# Patient Record
Sex: Female | Born: 1980 | Race: White | Hispanic: No | Marital: Single | Smoking: Former smoker
Health system: Southern US, Community
[De-identification: ages and names within clinical notes are randomized; demographics above are authoritative.]

## PROBLEM LIST (undated history)

## (undated) DIAGNOSIS — J45909 Unspecified asthma, uncomplicated: Secondary | ICD-10-CM

## (undated) DIAGNOSIS — F419 Anxiety disorder, unspecified: Secondary | ICD-10-CM

## (undated) DIAGNOSIS — E559 Vitamin D deficiency, unspecified: Secondary | ICD-10-CM

## (undated) DIAGNOSIS — M797 Fibromyalgia: Secondary | ICD-10-CM

## (undated) DIAGNOSIS — M546 Pain in thoracic spine: Secondary | ICD-10-CM

## (undated) DIAGNOSIS — F909 Attention-deficit hyperactivity disorder, unspecified type: Secondary | ICD-10-CM

## (undated) DIAGNOSIS — F1910 Other psychoactive substance abuse, uncomplicated: Secondary | ICD-10-CM

## (undated) DIAGNOSIS — T7840XA Allergy, unspecified, initial encounter: Secondary | ICD-10-CM

## (undated) DIAGNOSIS — M359 Systemic involvement of connective tissue, unspecified: Secondary | ICD-10-CM

## (undated) DIAGNOSIS — E282 Polycystic ovarian syndrome: Secondary | ICD-10-CM

## (undated) DIAGNOSIS — G8929 Other chronic pain: Secondary | ICD-10-CM

## (undated) DIAGNOSIS — M8430XA Stress fracture, unspecified site, initial encounter for fracture: Secondary | ICD-10-CM

## (undated) DIAGNOSIS — D649 Anemia, unspecified: Secondary | ICD-10-CM

## (undated) DIAGNOSIS — E039 Hypothyroidism, unspecified: Secondary | ICD-10-CM

## (undated) DIAGNOSIS — E079 Disorder of thyroid, unspecified: Secondary | ICD-10-CM

## (undated) DIAGNOSIS — M722 Plantar fascial fibromatosis: Secondary | ICD-10-CM

## (undated) DIAGNOSIS — R519 Headache, unspecified: Secondary | ICD-10-CM

## (undated) DIAGNOSIS — N809 Endometriosis, unspecified: Secondary | ICD-10-CM

## (undated) DIAGNOSIS — M25551 Pain in right hip: Principal | ICD-10-CM

## (undated) DIAGNOSIS — R51 Headache: Secondary | ICD-10-CM

## (undated) HISTORY — DX: Unspecified asthma, uncomplicated: J45.909

## (undated) HISTORY — DX: Allergy, unspecified, initial encounter: T78.40XA

## (undated) HISTORY — DX: Disorder of thyroid, unspecified: E07.9

## (undated) HISTORY — DX: Pain in thoracic spine: M54.6

## (undated) HISTORY — DX: Stress fracture, unspecified site, initial encounter for fracture: M84.30XA

## (undated) HISTORY — DX: Other psychoactive substance abuse, uncomplicated: F19.10

## (undated) HISTORY — DX: Vitamin D deficiency, unspecified: E55.9

## (undated) HISTORY — DX: Other chronic pain: G89.29

## (undated) HISTORY — DX: Plantar fascial fibromatosis: M72.2

## (undated) HISTORY — PX: APPENDECTOMY: SHX54

## (undated) HISTORY — DX: Anxiety disorder, unspecified: F41.9

## (undated) HISTORY — DX: Endometriosis, unspecified: N80.9

## (undated) HISTORY — DX: Pain in right hip: M25.551

## (undated) HISTORY — DX: Attention-deficit hyperactivity disorder, unspecified type: F90.9

## (undated) HISTORY — DX: Polycystic ovarian syndrome: E28.2

## (undated) HISTORY — PX: SHOULDER SURGERY: SHX246

## (undated) HISTORY — DX: Fibromyalgia: M79.7

---

## 1999-11-04 HISTORY — PX: SHOULDER SURGERY: SHX246

## 2002-08-09 ENCOUNTER — Emergency Department (HOSPITAL_COMMUNITY): Admission: EM | Admit: 2002-08-09 | Discharge: 2002-08-09 | Payer: Self-pay | Admitting: Nurse Practitioner

## 2002-11-29 ENCOUNTER — Emergency Department (HOSPITAL_COMMUNITY): Admission: EM | Admit: 2002-11-29 | Discharge: 2002-11-29 | Payer: Self-pay | Admitting: Emergency Medicine

## 2006-11-03 HISTORY — PX: APPENDECTOMY: SHX54

## 2007-11-13 ENCOUNTER — Emergency Department (HOSPITAL_COMMUNITY): Admission: EM | Admit: 2007-11-13 | Discharge: 2007-11-13 | Payer: Self-pay | Admitting: Emergency Medicine

## 2011-06-22 ENCOUNTER — Emergency Department: Payer: Self-pay | Admitting: Emergency Medicine

## 2011-07-14 ENCOUNTER — Emergency Department: Payer: Self-pay | Admitting: Emergency Medicine

## 2011-07-15 ENCOUNTER — Observation Stay: Payer: Self-pay | Admitting: Internal Medicine

## 2011-10-02 ENCOUNTER — Emergency Department: Payer: Self-pay | Admitting: Internal Medicine

## 2011-10-14 ENCOUNTER — Emergency Department: Payer: Self-pay | Admitting: Emergency Medicine

## 2012-03-10 ENCOUNTER — Emergency Department: Payer: Self-pay | Admitting: Emergency Medicine

## 2012-05-18 ENCOUNTER — Emergency Department: Payer: Self-pay | Admitting: Internal Medicine

## 2012-07-07 ENCOUNTER — Emergency Department: Payer: Self-pay | Admitting: Emergency Medicine

## 2012-07-22 ENCOUNTER — Encounter: Payer: Self-pay | Admitting: Family Medicine

## 2012-08-09 ENCOUNTER — Encounter: Payer: Self-pay | Admitting: Family Medicine

## 2012-09-02 ENCOUNTER — Observation Stay: Payer: Self-pay

## 2012-09-02 LAB — COMPREHENSIVE METABOLIC PANEL
Albumin: 3 g/dL — ABNORMAL LOW (ref 3.4–5.0)
Alkaline Phosphatase: 68 U/L (ref 50–136)
Anion Gap: 10 (ref 7–16)
BUN: 4 mg/dL — ABNORMAL LOW (ref 7–18)
Bilirubin,Total: 0.3 mg/dL (ref 0.2–1.0)
Calcium, Total: 8.4 mg/dL — ABNORMAL LOW (ref 8.5–10.1)
Chloride: 107 mmol/L (ref 98–107)
Co2: 23 mmol/L (ref 21–32)
Creatinine: 0.37 mg/dL — ABNORMAL LOW (ref 0.60–1.30)
EGFR (African American): >60
EGFR (Non-African Amer.): >60
Glucose: 81 mg/dL (ref 65–99)
Osmolality: 275 (ref 275–301)
Potassium: 3.7 mmol/L (ref 3.5–5.1)
SGOT(AST): 23 U/L (ref 15–37)
SGPT (ALT): 20 U/L (ref 12–78)
Sodium: 140 mmol/L (ref 136–145)
Total Protein: 6.7 g/dL (ref 6.4–8.2)

## 2012-09-02 LAB — CBC
HCT: 33.5 % — ABNORMAL LOW (ref 35.0–47.0)
HGB: 12 g/dL (ref 12.0–16.0)
MCH: 33.6 pg (ref 26.0–34.0)
MCHC: 35.8 g/dL (ref 32.0–36.0)
MCV: 94 fL (ref 80–100)
Platelet: 181 10*3/uL (ref 150–440)
RBC: 3.57 10*6/uL — ABNORMAL LOW (ref 3.80–5.20)
RDW: 12.9 % (ref 11.5–14.5)
WBC: 10 10*3/uL (ref 3.6–11.0)

## 2012-09-02 LAB — URINALYSIS, COMPLETE
Bilirubin,UR: NEGATIVE
Blood: NEGATIVE
Glucose,UR: NEGATIVE mg/dL (ref 0–75)
Ketone: NEGATIVE
Nitrite: NEGATIVE
Ph: 8 (ref 4.5–8.0)
Protein: NEGATIVE
RBC,UR: 3 /HPF (ref 0–5)
Specific Gravity: 1.012 (ref 1.003–1.030)
Squamous Epithelial: 34
WBC UR: 3 /HPF (ref 0–5)

## 2012-09-02 LAB — LIPASE, BLOOD: Lipase: 132 U/L (ref 73–393)

## 2012-09-03 ENCOUNTER — Encounter: Payer: Self-pay | Admitting: Family Medicine

## 2012-10-06 ENCOUNTER — Emergency Department: Payer: Self-pay | Admitting: Emergency Medicine

## 2012-11-14 ENCOUNTER — Emergency Department: Payer: Self-pay | Admitting: Emergency Medicine

## 2012-11-16 LAB — BETA STREP CULTURE(ARMC)

## 2012-11-29 ENCOUNTER — Observation Stay: Payer: Self-pay | Admitting: Obstetrics and Gynecology

## 2012-12-14 DIAGNOSIS — Z3480 Encounter for supervision of other normal pregnancy, unspecified trimester: Secondary | ICD-10-CM | POA: Insufficient documentation

## 2012-12-16 DIAGNOSIS — J45909 Unspecified asthma, uncomplicated: Secondary | ICD-10-CM | POA: Insufficient documentation

## 2012-12-16 DIAGNOSIS — F3132 Bipolar disorder, current episode depressed, moderate: Secondary | ICD-10-CM | POA: Insufficient documentation

## 2013-03-02 ENCOUNTER — Emergency Department: Payer: Self-pay | Admitting: Internal Medicine

## 2013-03-02 LAB — URINALYSIS, COMPLETE
Bacteria: NONE SEEN
Bilirubin,UR: NEGATIVE
Bilirubin,UR: NEGATIVE
Glucose,UR: NEGATIVE mg/dL (ref 0–75)
Glucose,UR: NEGATIVE mg/dL (ref 0–75)
Ketone: NEGATIVE
Ketone: NEGATIVE
Leukocyte Esterase: NEGATIVE
Nitrite: NEGATIVE
Nitrite: NEGATIVE
Ph: 6 (ref 4.5–8.0)
Ph: 6 (ref 4.5–8.0)
Protein: 30
Protein: NEGATIVE
RBC,UR: 116 /HPF (ref 0–5)
RBC,UR: 1 /HPF (ref 0–5)
Specific Gravity: 1.013 (ref 1.003–1.030)
Specific Gravity: 1.011 (ref 1.003–1.030)
Squamous Epithelial: 14
Squamous Epithelial: 1
WBC UR: 10 /HPF (ref 0–5)
WBC UR: 1 /HPF (ref 0–5)

## 2013-05-02 ENCOUNTER — Emergency Department: Payer: Self-pay | Admitting: Emergency Medicine

## 2013-05-12 ENCOUNTER — Emergency Department: Payer: Self-pay

## 2013-05-12 LAB — COMPREHENSIVE METABOLIC PANEL
Albumin: 4 g/dL (ref 3.4–5.0)
Alkaline Phosphatase: 114 U/L (ref 50–136)
Anion Gap: 3 — ABNORMAL LOW (ref 7–16)
BUN: 15 mg/dL (ref 7–18)
Bilirubin,Total: 0.4 mg/dL (ref 0.2–1.0)
Calcium, Total: 8.9 mg/dL (ref 8.5–10.1)
Chloride: 109 mmol/L — ABNORMAL HIGH (ref 98–107)
Co2: 29 mmol/L (ref 21–32)
Creatinine: 0.99 mg/dL (ref 0.60–1.30)
EGFR (African American): >60
EGFR (Non-African Amer.): >60
Glucose: 95 mg/dL (ref 65–99)
Osmolality: 282 (ref 275–301)
Potassium: 3.9 mmol/L (ref 3.5–5.1)
SGOT(AST): 19 U/L (ref 15–37)
SGPT (ALT): 22 U/L (ref 12–78)
Sodium: 141 mmol/L (ref 136–145)
Total Protein: 7.8 g/dL (ref 6.4–8.2)

## 2013-05-12 LAB — LIPASE, BLOOD: Lipase: 129 U/L (ref 73–393)

## 2013-05-12 LAB — CBC
HCT: 40.5 % (ref 35.0–47.0)
HGB: 13.6 g/dL (ref 12.0–16.0)
MCH: 30.6 pg (ref 26.0–34.0)
MCHC: 33.7 g/dL (ref 32.0–36.0)
MCV: 91 fL (ref 80–100)
Platelet: 222 10*3/uL (ref 150–440)
RBC: 4.47 10*6/uL (ref 3.80–5.20)
RDW: 14.7 % — ABNORMAL HIGH (ref 11.5–14.5)
WBC: 10.9 10*3/uL (ref 3.6–11.0)

## 2013-05-12 LAB — URINALYSIS, COMPLETE
Bilirubin,UR: NEGATIVE
Glucose,UR: NEGATIVE mg/dL (ref 0–75)
Ketone: NEGATIVE
Leukocyte Esterase: NEGATIVE
Nitrite: NEGATIVE
Ph: 6 (ref 4.5–8.0)
Protein: 100
RBC,UR: 3 /HPF (ref 0–5)
Specific Gravity: 1.008 (ref 1.003–1.030)
Squamous Epithelial: 8
WBC UR: 3 /HPF (ref 0–5)

## 2013-06-02 ENCOUNTER — Emergency Department: Payer: Self-pay | Admitting: Internal Medicine

## 2013-06-11 ENCOUNTER — Emergency Department: Payer: Self-pay | Admitting: Emergency Medicine

## 2013-06-17 DIAGNOSIS — M8430XA Stress fracture, unspecified site, initial encounter for fracture: Secondary | ICD-10-CM

## 2013-06-17 HISTORY — DX: Stress fracture, unspecified site, initial encounter for fracture: M84.30XA

## 2013-06-27 ENCOUNTER — Ambulatory Visit: Payer: Self-pay | Admitting: Family Medicine

## 2013-07-01 DIAGNOSIS — M722 Plantar fascial fibromatosis: Secondary | ICD-10-CM

## 2013-07-01 HISTORY — DX: Plantar fascial fibromatosis: M72.2

## 2013-08-02 ENCOUNTER — Encounter: Payer: Self-pay | Admitting: *Deleted

## 2013-08-02 DIAGNOSIS — M8430XA Stress fracture, unspecified site, initial encounter for fracture: Secondary | ICD-10-CM | POA: Insufficient documentation

## 2013-08-02 DIAGNOSIS — M722 Plantar fascial fibromatosis: Secondary | ICD-10-CM | POA: Insufficient documentation

## 2013-08-05 ENCOUNTER — Encounter: Payer: Self-pay | Admitting: Podiatry

## 2013-08-05 ENCOUNTER — Ambulatory Visit: Payer: Self-pay | Admitting: Podiatry

## 2013-08-05 ENCOUNTER — Ambulatory Visit (INDEPENDENT_AMBULATORY_CARE_PROVIDER_SITE_OTHER): Payer: Medicaid Other | Admitting: Podiatry

## 2013-08-05 DIAGNOSIS — M722 Plantar fascial fibromatosis: Secondary | ICD-10-CM

## 2013-08-05 NOTE — Patient Instructions (Signed)
Call if any problems

## 2013-08-08 NOTE — Progress Notes (Signed)
Subjective:     Patient ID: Renee Benjamin, female   DOB: [birth year], 32 y.o.   MRN: [id number]  HPIIm improving but still having pain in my left foot. States her right foot continues to improve   Review of Systems  All other systems reviewed and are negative.       Objective:   Physical Exam  Cardiovascular: Intact distal pulses.    Neurological in tact, muscle strength adequate. I noticed pain is diminishing in the left foot currently    Assessment:     Gradual improvement in feet    Plan:     Continue p.t.  Increase activity slowly and soaks at night. Supportive shoe gear at all times

## 2014-02-01 ENCOUNTER — Emergency Department: Payer: Self-pay | Admitting: Emergency Medicine

## 2014-02-03 LAB — BETA STREP CULTURE(ARMC)

## 2014-03-14 ENCOUNTER — Encounter: Payer: Self-pay | Admitting: Podiatry

## 2014-03-14 ENCOUNTER — Ambulatory Visit (INDEPENDENT_AMBULATORY_CARE_PROVIDER_SITE_OTHER): Payer: Medicaid Other

## 2014-03-14 ENCOUNTER — Ambulatory Visit (INDEPENDENT_AMBULATORY_CARE_PROVIDER_SITE_OTHER): Payer: PRIVATE HEALTH INSURANCE | Admitting: Podiatry

## 2014-03-14 VITALS — BP 118/68 | HR 62 | Resp 16 | Ht 68.0 in | Wt 202.0 lb

## 2014-03-14 DIAGNOSIS — M722 Plantar fascial fibromatosis: Secondary | ICD-10-CM

## 2014-03-14 DIAGNOSIS — R609 Edema, unspecified: Secondary | ICD-10-CM

## 2014-03-14 DIAGNOSIS — M779 Enthesopathy, unspecified: Secondary | ICD-10-CM

## 2014-03-14 DIAGNOSIS — M948X9 Other specified disorders of cartilage, unspecified sites: Secondary | ICD-10-CM

## 2014-03-14 MED ORDER — TRIAMCINOLONE ACETONIDE 10 MG/ML IJ SUSP
10.0000 mg | Freq: Once | INTRAMUSCULAR | Status: AC
  Administered 2014-03-14: 10 mg

## 2014-03-15 NOTE — Progress Notes (Signed)
Subjective:     Patient ID: Renee Benjamin, female   DOB: [birth year], 33 y.o.   MRN: [id number]  HPI patient states that my left foot has been swollen and bothering me for the last few weeks and I don't remember any injury   Review of Systems     Objective:   Physical Exam Neurovascular status intact with muscle strength adequate and no range of motion loss. I noted there to be discomfort in the sesamoidal complex left with inflammation and fluid buildup with the entire plantar surface of the sesamoidal complex been painful    Assessment:     Probable sesamoiditis or plantar capsule inflammation of the first MPJ    Plan:     H&P and x-ray reviewed. Explained condition and that we may need to immobilize this and she does have a boot at home which she will wear since it is inflamed I did a careful plantar capsular injection 3 mg Kenalog dexamethasone 5 mg Xylocaine and advised him reduced activity. If it does not improve reappoint

## 2014-03-16 ENCOUNTER — Emergency Department: Payer: Self-pay | Admitting: Emergency Medicine

## 2014-03-16 ENCOUNTER — Encounter: Payer: Self-pay | Admitting: *Deleted

## 2014-03-16 LAB — COMPREHENSIVE METABOLIC PANEL
Albumin: 3.3 g/dL — ABNORMAL LOW (ref 3.4–5.0)
Alkaline Phosphatase: 68 U/L
Anion Gap: 5 — ABNORMAL LOW (ref 7–16)
BUN: 6 mg/dL — ABNORMAL LOW (ref 7–18)
Bilirubin,Total: 0.3 mg/dL (ref 0.2–1.0)
Calcium, Total: 8.9 mg/dL (ref 8.5–10.1)
Chloride: 106 mmol/L (ref 98–107)
Co2: 29 mmol/L (ref 21–32)
Creatinine: 0.63 mg/dL (ref 0.60–1.30)
EGFR (African American): >60
EGFR (Non-African Amer.): >60
Glucose: 88 mg/dL (ref 65–99)
Osmolality: 276 (ref 275–301)
Potassium: 3.8 mmol/L (ref 3.5–5.1)
SGOT(AST): 11 U/L — ABNORMAL LOW (ref 15–37)
SGPT (ALT): 13 U/L (ref 12–78)
Sodium: 140 mmol/L (ref 136–145)
Total Protein: 7.5 g/dL (ref 6.4–8.2)

## 2014-03-16 LAB — CBC WITH DIFFERENTIAL/PLATELET
Basophil #: 0 10*3/uL (ref 0.0–0.1)
Basophil %: 0.6 %
Eosinophil #: 0 10*3/uL (ref 0.0–0.7)
Eosinophil %: 0.5 %
HCT: 37.9 % (ref 35.0–47.0)
HGB: 13 g/dL (ref 12.0–16.0)
Lymphocyte #: 2.4 10*3/uL (ref 1.0–3.6)
Lymphocyte %: 30.8 %
MCH: 31.9 pg (ref 26.0–34.0)
MCHC: 34.4 g/dL (ref 32.0–36.0)
MCV: 93 fL (ref 80–100)
Monocyte #: 0.5 x10 3/mm (ref 0.2–0.9)
Monocyte %: 6.4 %
Neutrophil #: 4.8 10*3/uL (ref 1.4–6.5)
Neutrophil %: 61.7 %
Platelet: 255 10*3/uL (ref 150–440)
RBC: 4.08 10*6/uL (ref 3.80–5.20)
RDW: 12.5 % (ref 11.5–14.5)
WBC: 7.8 10*3/uL (ref 3.6–11.0)

## 2014-03-16 LAB — URINALYSIS, COMPLETE
Bilirubin,UR: NEGATIVE
Blood: NEGATIVE
Glucose,UR: NEGATIVE mg/dL (ref 0–75)
Ketone: NEGATIVE
Leukocyte Esterase: NEGATIVE
Nitrite: NEGATIVE
Ph: 5 (ref 4.5–8.0)
Protein: NEGATIVE
RBC,UR: 4 /HPF (ref 0–5)
Specific Gravity: 1.027 (ref 1.003–1.030)
Squamous Epithelial: 24
WBC UR: 13 /HPF (ref 0–5)

## 2014-03-28 ENCOUNTER — Ambulatory Visit (INDEPENDENT_AMBULATORY_CARE_PROVIDER_SITE_OTHER): Payer: PRIVATE HEALTH INSURANCE | Admitting: General Surgery

## 2014-03-28 ENCOUNTER — Encounter: Payer: Self-pay | Admitting: General Surgery

## 2014-03-28 VITALS — BP 122/80 | HR 82 | Resp 12 | Ht 68.0 in | Wt 200.0 lb

## 2014-03-28 DIAGNOSIS — R1900 Intra-abdominal and pelvic swelling, mass and lump, unspecified site: Secondary | ICD-10-CM | POA: Diagnosis not present

## 2014-03-28 DIAGNOSIS — R222 Localized swelling, mass and lump, trunk: Secondary | ICD-10-CM

## 2014-03-28 NOTE — Patient Instructions (Signed)
May use heating pad to the area for comfort. Follow up in 2 weeks

## 2014-03-28 NOTE — Progress Notes (Signed)
Patient ID: Renee Benjamin, female   DOB: 1981-06-18, 33 y.o.   MRN: [id number]  Chief Complaint  Patient presents with  . Hernia    HPI Renee Benjamin is a 33 y.o. female.  Here today for evaulation of a possible hernia. States she has had left lower abdominal pain since Mar 12 2014 lifting her son off the carousel. She states there is a knot there and initially had noticed some bruising that does seem to be better. Went to the ER on 03-16-14 and they did rays and a CT scan.  HPI  Past Medical History  Diagnosis Date  . Allergy   . Asthma   . Gestational diabetes   . Endometriosis   . Polycystic ovarian disease     Past Surgical History  Procedure Laterality Date  . Appendectomy  2008  . Shoulder surgery  2001  . Cesarean section  2014    History reviewed. No pertinent family history.  Social History History  Substance Use Topics  . Smoking status: Former Research scientist (life sciences)  . Smokeless tobacco: Never Used  . Alcohol Use: No    Allergies  Allergen Reactions  . Depakote [Divalproex Sodium] Hives  . Morphine And Related Hives  . Tramadol Nausea And Vomiting    Current Outpatient Prescriptions  Medication Sig Dispense Refill  . azelastine (ASTELIN) 0.1 % nasal spray Place into both nostrils 2 (two) times daily. Use in each nostril as directed      . fexofenadine (ALLEGRA) 30 MG tablet Take 180 mg by mouth daily.       Renee Benjamin HYDROcodone-acetaminophen (NORCO/VICODIN) 5-325 MG per tablet Take 0.5 tablets by mouth every 6 (six) hours as needed for moderate pain.       . mometasone (NASONEX) 50 MCG/ACT nasal spray Place 2 sprays into the nose daily.      . montelukast (SINGULAIR) 10 MG tablet Take 10 mg by mouth at bedtime.       No current facility-administered medications for this visit.    Review of Systems Review of Systems  Constitutional: Negative.   Respiratory: Negative.   Gastrointestinal: Positive for abdominal pain and diarrhea. Negative for blood in stool.     Blood pressure 122/80, pulse 82, resp. rate 12, height 5\' 8"  (1.727 m), weight 200 lb (90.719 kg), last menstrual period 03/26/2014.  Physical Exam Physical Exam  Constitutional: She is oriented to person, place, and time. She appears well-developed and well-nourished.  Eyes: Conjunctivae are normal.  Neck: Neck supple.  Cardiovascular: Normal rate, regular rhythm and normal heart sounds.   Pulmonary/Chest: Effort normal and breath sounds normal.  Abdominal: Soft. Normal appearance and bowel sounds are normal. There is tenderness in the left lower quadrant.  2 x 1 cm hard subcutaneous mass left lower quadrant. The over lying skin has a mild resolving ecchymosis.   Lymphadenopathy:    She has no cervical adenopathy.  Neurological: She is alert and oriented to person, place, and time.  Skin: Skin is warm and dry.    Data Reviewed CT scan reviewed.  Assessment    A 2 x 1 cm hard subcutaneous mass left lower quadrant. The over lying skin has a mild resolving ecchymosis. Likely a hematoma now resolving. Remote possibility of underlying endometrioma.      Plan    May use heating pad to the area for comfort. Follow up in 3 weeks.       Kamarian Sahakian G Azalea Cedar 03/28/2014, 4:52 PM

## 2014-04-19 ENCOUNTER — Ambulatory Visit: Payer: Medicaid Other | Admitting: General Surgery

## 2014-04-25 ENCOUNTER — Ambulatory Visit: Payer: Medicaid Other | Admitting: General Surgery

## 2014-05-11 ENCOUNTER — Encounter: Payer: Self-pay | Admitting: *Deleted

## 2014-06-07 ENCOUNTER — Emergency Department: Payer: Self-pay | Admitting: Emergency Medicine

## 2014-06-07 LAB — URINALYSIS, COMPLETE
Bilirubin,UR: NEGATIVE
Blood: NEGATIVE
Glucose,UR: NEGATIVE mg/dL (ref 0–75)
Ketone: NEGATIVE
Leukocyte Esterase: NEGATIVE
Nitrite: NEGATIVE
Ph: 5 (ref 4.5–8.0)
Protein: NEGATIVE
RBC,UR: 1 /HPF (ref 0–5)
Specific Gravity: 1.016 (ref 1.003–1.030)
Squamous Epithelial: 4
WBC UR: 1 /HPF (ref 0–5)

## 2014-06-07 LAB — PREGNANCY, URINE: Pregnancy Test, Urine: NEGATIVE m[IU]/mL

## 2014-06-24 ENCOUNTER — Emergency Department: Payer: Self-pay | Admitting: Emergency Medicine

## 2014-07-06 ENCOUNTER — Emergency Department: Payer: Self-pay | Admitting: Emergency Medicine

## 2014-08-01 ENCOUNTER — Ambulatory Visit (INDEPENDENT_AMBULATORY_CARE_PROVIDER_SITE_OTHER): Payer: PRIVATE HEALTH INSURANCE | Admitting: Podiatry

## 2014-08-01 ENCOUNTER — Ambulatory Visit (INDEPENDENT_AMBULATORY_CARE_PROVIDER_SITE_OTHER): Payer: PRIVATE HEALTH INSURANCE

## 2014-08-01 ENCOUNTER — Encounter: Payer: Self-pay | Admitting: Podiatry

## 2014-08-01 VITALS — BP 101/65 | HR 73 | Resp 16

## 2014-08-01 DIAGNOSIS — R609 Edema, unspecified: Secondary | ICD-10-CM

## 2014-08-01 DIAGNOSIS — M722 Plantar fascial fibromatosis: Secondary | ICD-10-CM

## 2014-08-01 DIAGNOSIS — M779 Enthesopathy, unspecified: Secondary | ICD-10-CM

## 2014-08-01 MED ORDER — PREDNISONE 10 MG PO TABS: ORAL_TABLET | ORAL | Status: DC

## 2014-08-01 NOTE — Progress Notes (Signed)
Subjective:     Patient ID: Renee Benjamin, female   DOB: [birth year], 33 y.o.   MRN: [id number]  Foot Pain   patient presents stating I'm having a lot of pain on top of my left foot and on the bottom and I'm not sure what it might have done. States it's bad at night and I cannot keep the foot stretched and it seems like it has gotten only a little bit better   Review of Systems  All other systems reviewed and are negative.      Objective:   Physical Exam  Constitutional: She is oriented to person, place, and time.  Cardiovascular: Intact distal pulses.   Musculoskeletal: Normal range of motion.  Neurological: She is oriented to person, place, and time.  Skin: Skin is warm.   neurovascular status found to be intact with muscle strength adequate and range of motion subtalar and midtarsal joint within normal limits. Patient is noted to have quite a bit of edema in the dorsum of the left foot but it is not specifically in the metatarsal shaft a more proximal and over a fairly wide distribution area. Mild discomfort in the lesser MPJs and in the arch itself     Assessment:     Probable tendinitis or inflammatory condition and cannot rule out stress fracture even though the pattern is not consistent with this    Plan:     H&P and x-rays reviewed. Placed on 12 a sterile prep DS Dosepak and instructed on ice therapy and dispensed night splint to try to stretch the foot while sleeping and then will wear the boot part-time during the day. Reappoint for [place] to recheck in 2 weeks

## 2014-08-11 ENCOUNTER — Ambulatory Visit (INDEPENDENT_AMBULATORY_CARE_PROVIDER_SITE_OTHER): Payer: PRIVATE HEALTH INSURANCE

## 2014-08-11 ENCOUNTER — Encounter: Payer: Self-pay | Admitting: Podiatry

## 2014-08-11 ENCOUNTER — Ambulatory Visit (INDEPENDENT_AMBULATORY_CARE_PROVIDER_SITE_OTHER): Payer: PRIVATE HEALTH INSURANCE | Admitting: Podiatry

## 2014-08-11 VITALS — BP 108/75 | HR 56 | Resp 16

## 2014-08-11 DIAGNOSIS — M779 Enthesopathy, unspecified: Secondary | ICD-10-CM

## 2014-08-14 NOTE — Progress Notes (Signed)
Subjective:     Patient ID: Renee Benjamin, female   DOB: 05/08/1981, 33 y.o.   MRN: [id number]  HPI patient presents stating I'm doing better but I still gets some pain in my foot if I been on all day   Review of Systems     Objective:   Physical Exam Neurovascular status intact with no change in health history and discomfort still noted plantar foot upon palpation    Assessment:     Continuing to develop pain but improved from previous    Plan:      at this time recommended anti-inflammatories supportive shoe gear and physica reappoint as neededl therapy.

## 2014-09-04 ENCOUNTER — Encounter: Payer: Self-pay | Admitting: Podiatry

## 2014-11-23 ENCOUNTER — Encounter: Payer: Self-pay | Admitting: Obstetrics & Gynecology

## 2014-11-23 ENCOUNTER — Encounter: Payer: Self-pay | Admitting: *Deleted

## 2014-11-23 ENCOUNTER — Ambulatory Visit (INDEPENDENT_AMBULATORY_CARE_PROVIDER_SITE_OTHER): Payer: BLUE CROSS/BLUE SHIELD | Admitting: Obstetrics & Gynecology

## 2014-11-23 VITALS — BP 109/80 | HR 78 | Ht 68.0 in | Wt 204.0 lb

## 2014-11-23 DIAGNOSIS — Z124 Encounter for screening for malignant neoplasm of cervix: Secondary | ICD-10-CM | POA: Diagnosis not present

## 2014-11-23 DIAGNOSIS — G8929 Other chronic pain: Secondary | ICD-10-CM | POA: Diagnosis not present

## 2014-11-23 DIAGNOSIS — N949 Unspecified condition associated with female genital organs and menstrual cycle: Secondary | ICD-10-CM

## 2014-11-23 DIAGNOSIS — Z01411 Encounter for gynecological examination (general) (routine) with abnormal findings: Secondary | ICD-10-CM

## 2014-11-23 DIAGNOSIS — R102 Pelvic and perineal pain: Secondary | ICD-10-CM

## 2014-11-23 DIAGNOSIS — Z01419 Encounter for gynecological examination (general) (routine) without abnormal findings: Secondary | ICD-10-CM

## 2014-11-23 DIAGNOSIS — N920 Excessive and frequent menstruation with regular cycle: Secondary | ICD-10-CM

## 2014-11-23 DIAGNOSIS — Z01818 Encounter for other preprocedural examination: Secondary | ICD-10-CM

## 2014-11-23 DIAGNOSIS — Z1151 Encounter for screening for human papillomavirus (HPV): Secondary | ICD-10-CM

## 2014-11-23 NOTE — Progress Notes (Signed)
GYNECOLOGY CLINIC ANNUAL PREVENTATIVE CARE ENCOUNTER NOTE  Subjective:     Renee Benjamin is a 34 y.o. G1P1 female here for a routine annual gynecologic exam and to establish care.  Current complaints: patient reports having a diagnosis of PCOS and endometriosis diagnosed on ultrasound (no laparoscopy).  Uses patch for contraception and AUB, but still experiences heavy bleeding requiring pad changes every 1-2 hours during her hormone withdrawal bleeding period.  She has gained a lot of weight in the last two years on the patch, and does not want any further hormonal therapy for her AUB; declines Mirena or other formulations.  Also reports worsening dyspareunia and chronic pelvic pain, L >>R.  Pain is sometimes associated with nausea and diarrhea. Patient reports having a very tough pregnancy, had GDM and ended up with cesarean section after "nine hours of pushing". Never wants to be pregnant again.  Does not want tubal sterilization, wants definitive therapy for all her symptoms in the form of hysterectomy.   Gynecologic History Patient's last menstrual period was 10/30/2014. Contraception: Ortho-Evra patches weekly Last Pap: 2015. Results were: normal  Obstetric History OB History  Gravida Para Term Preterm AB SAB TAB Ectopic Multiple Living  1 1        1     # Outcome Date GA Lbr Len/2nd Weight Sex Delivery Anes PTL Lv  1 Para             Obstetric Comments  1st Menstrual Cycle:  12   1st Pregnancy:  31  Cesarean section x 1  History   Social History  . Marital Status: Single    Spouse Name: N/A    Number of Children: N/A  . Years of Education: N/A   Occupational History  . Not on file.   Social History Main Topics  . Smoking status: Former Research scientist (life sciences)  . Smokeless tobacco: Never Used  . Alcohol Use: No  . Drug Use: No  . Sexual Activity:    Partners: Male    Birth Control/ Protection: Patch   Other Topics Concern  . Not on file   Social History Narrative   The  following portions of the patient's history were reviewed and updated as appropriate: allergies, current medications, past family history, past medical history, past social history, past surgical history and problem list.  Review of Systems Pertinent items are noted in HPI.   Objective:   BP 109/80 mmHg  Pulse 78  Ht 5\' 8"  (1.727 m)  Wt 204 lb (92.534 kg)  BMI 31.03 kg/m2  LMP 10/30/2014 GENERAL: Well-developed, well-nourished female in no acute distress.  HEENT: Normocephalic, atraumatic. Sclerae anicteric.  NECK: Supple. Normal thyroid.  LUNGS: Clear to auscultation bilaterally.  HEART: Regular rate and rhythm. BREASTS: Symmetric in size. No masses, skin changes, nipple drainage, or lymphadenopathy. ABDOMEN: Soft, mild tenderness in LLQ, nondistended. No organomegaly. Healed Pfannebsteil incision. PELVIC: Normal external female genitalia. Vagina is pink and rugated.  Normal discharge. Normal cervix contour. Pap smear obtained. Uterus is normal in size. No adnexal mass or tenderness.  EXTREMITIES: No cyanosis, clubbing, or edema, 2+ distal pulses.   Assessment:   Annual gynecologic examination PCOS AUB Chronic pelvic pain   Plan:   Pap done, will follow up results and manage accordingly.  Patient desires definitive management with hysterectomy.  She understands that this is definitive therapy for her AUB, it is not guaranteed that her pelvic pain will go away after surgery, given associated GI symptoms and possibility of IBS.  She sill wants to proceed with hysterectomy.   I proposed doing a total laparoscopic hysterectomy (TLH) prophylactic bilateral salpingectomy given her history of pelvic pain and cesarean section. No indication for oophorectomy unless severe endometriosis is encountered during surgery; patient wants to avoid HRT as she thinks her "body does not react well to hormones".   Patient agrees with this proposed surgery.  The risks of surgery were discussed in detail  with the patient including but not limited to: bleeding which may require transfusion or reoperation; infection which may require antibiotics; injury to bowel, bladder, ureters or other surrounding organs; need for additional procedures including laparotomy; thromboembolic phenomenon, incisional problems and other postoperative/anesthesia complications.  Patient was also advised that she will remain in house for 1 day; and expected recovery time after a hysterectomy is 2-4 weeks.  Likelihood of success in alleviating the patient's symptoms was discussed.   She was told that she will be contacted by our surgical scheduler regarding the time and date of her surgery; routine preoperative instructions of having nothing to eat or drink after midnight on the day prior to surgery and also coming to the hospital 1 1/2 hours prior to her time of surgery were also emphasized.  She was told she may be called for a preoperative appointment about a week prior to surgery and will be given further preoperative instructions at that visit.  Routine postoperative instructions will be reviewed with the patient and her family in detail after surgery.  In the meantime, she will continue NSAIDs and the patch for now without the hormone free period; bleeding precautions were reviewed. Printed patient education handouts about the procedure was given to the patient to review at home.   Verita Schneiders, MD, [place] Attending [place] for [employer], [place]

## 2014-11-23 NOTE — Patient Instructions (Addendum)
Hysterectomy Information  A hysterectomy is a surgery in which your uterus is removed. This surgery may be done to treat various medical problems. After the surgery, you will no longer have menstrual periods. The surgery will also make you unable to become pregnant (sterile). The fallopian tubes and ovaries can be removed (bilateral salpingo-oophorectomy) during this surgery as well.  REASONS FOR A HYSTERECTOMY  Persistent, abnormal bleeding.  Lasting (chronic) pelvic pain or infection.  The lining of the uterus (endometrium) starts growing outside the uterus (endometriosis).  The endometrium starts growing in the muscle of the uterus (adenomyosis).  The uterus falls down into the vagina (pelvic organ prolapse).  Noncancerous growths in the uterus (uterine fibroids) that cause symptoms.  Precancerous cells.  Cervical cancer or uterine cancer. TYPES OF HYSTERECTOMIES  Supracervical hysterectomy--In this type, the top part of the uterus is removed, but not the cervix.  Total hysterectomy--The uterus and cervix are removed.  Radical hysterectomy--The uterus, the cervix, and the fibrous tissue that holds the uterus in place in the pelvis (parametrium) are removed. WAYS A HYSTERECTOMY CAN BE PERFORMED  Abdominal hysterectomy--A large surgical cut (incision) is made in the abdomen. The uterus is removed through this incision.  Vaginal hysterectomy--An incision is made in the vagina. The uterus is removed through this incision. There are no abdominal incisions.  Conventional laparoscopic hysterectomy--Three or four small incisions are made in the abdomen. A thin, lighted tube with a camera (laparoscope) is inserted into one of the incisions. Other tools are put through the other incisions. The uterus is cut into small pieces. The small pieces are removed through the incisions, or they are removed through the vagina.  Laparoscopically assisted vaginal hysterectomy (LAVH)--Three or four  small incisions are made in the abdomen. Part of the surgery is performed laparoscopically and part vaginally. The uterus is removed through the vagina.  Robot-assisted laparoscopic hysterectomy--A laparoscope and other tools are inserted into 3 or 4 small incisions in the abdomen. A computer-controlled device is used to give the surgeon a 3D image and to help control the surgical instruments. This allows for more precise movements of surgical instruments. The uterus is cut into small pieces and removed through the incisions or removed through the vagina. RISKS AND COMPLICATIONS  Possible complications associated with this procedure include:  Bleeding and risk of blood transfusion. Tell your health care provider if you do not want to receive any blood products.  Blood clots in the legs or lung.  Infection.  Injury to surrounding organs.  Problems or side effects related to anesthesia.  Conversion to an abdominal hysterectomy from one of the other techniques. WHAT TO EXPECT AFTER A HYSTERECTOMY  You will be given pain medicine.  You will need to have someone with you for the first 3-5 days after you go home.  You will need to follow up with your surgeon in 2-4 weeks after surgery to evaluate your progress.  You may have early menopause symptoms such as hot flashes, night sweats, and insomnia.  If you had a hysterectomy for a problem that was not cancer or not a condition that could lead to cancer, then you no longer need Pap tests. However, even if you no longer need a Pap test, a regular exam is a good idea to make sure no other problems are starting. Document Released: 04/15/2001 Document Revised: 08/10/2013 Document Reviewed: 06/27/2013 [facility] Patient Information 2015 [place], [place]. This information is not intended to replace advice given to you by your health care  provider. Make sure you discuss any questions you have with your health care provider.  Total Laparoscopic  Hysterectomy A total laparoscopic hysterectomy is a minimally invasive surgery to remove your uterus and cervix. This surgery is performed by making several small cuts (incisions) in your abdomen. It can also be done with a thin, lighted tube (laparoscope) inserted into two small incisions in your lower abdomen. Your fallopian tubes and ovaries can be removed (bilateral salpingo-oophorectomy) during this surgery as well.Benefits of minimally invasive surgery include:  Less pain.  Less risk of blood loss.  Less risk of infection.  Quicker return to normal activities. LET [facility] CARE PROVIDER KNOW ABOUT:  Any allergies you have.  All medicines you are taking, including vitamins, herbs, eye drops, creams, and over-the-counter medicines.  Previous problems you or members of your family have had with the use of anesthetics.  Any blood disorders you have.  Previous surgeries you have had.  Medical conditions you have. RISKS AND COMPLICATIONS  Generally, this is a safe procedure. However, as with any procedure, complications can occur. Possible complications include:  Bleeding.  Blood clots in the legs or lung.  Infection.  Injury to surrounding organs.  Problems with anesthesia.  Early menopause symptoms (hot flashes, night sweats, insomnia).  Risk of conversion to an open abdominal incision. BEFORE THE PROCEDURE  Ask your health care provider about changing or stopping your regular medicines.  Do not take aspirin or blood thinners (anticoagulants) for 1 week before the surgery or as told by your health care provider.  Do not eat or drink anything for 8 hours before the surgery or as told by your health care provider.  Quit smoking if you smoke.  Arrange for a ride home after surgery and for someone to help you at home during recovery. PROCEDURE   You will be given antibiotic medicine.  An IV tube will be placed in your arm. You will be given medicine to make you  sleep (general anesthetic).  A gas (carbon dioxide) will be used to inflate your abdomen. This will allow your surgeon to look inside your abdomen, perform your surgery, and treat any other problems found if necessary.  Three or four small incisions (often less than 1/2 inch) will be made in your abdomen. One of these incisions will be made in the area of your belly button (navel). The laparoscope will be inserted into the incision. Your surgeon will look through the laparoscope while doing your procedure.  Other surgical instruments will be inserted through the other incisions.  Your uterus may be removed through your vagina or cut into small pieces and removed through the small incisions.  Your incisions will be closed. AFTER THE PROCEDURE  The gas will be released from inside your abdomen.  You will be taken to the recovery area where a nurse will watch and check your progress. Once you are awake, stable, and taking fluids well, without other problems, you will return to your room or be allowed to go home.  There is usually minimal discomfort following the surgery because the incisions are so small.  You will be given pain medicine while you are in the hospital and for when you go home. Document Released: 08/17/2007 Document Revised: 06/22/2013 Document Reviewed: 05/10/2013 [facility] Patient Information 2015 [place], [place]. This information is not intended to replace advice given to you by your health care provider. Make sure you discuss any questions you have with your health care provider.  Preventive Care for Adults A healthy lifestyle and preventive care can promote health and wellness. Preventive health guidelines for women include the following key practices.  A routine yearly physical is a good way to check with your health care provider about your health and preventive screening. It is a chance to share any concerns and updates on your health and to receive a thorough  exam.  Visit your dentist for a routine exam and preventive care every 6 months. Brush your teeth twice a day and floss once a day. Good oral hygiene prevents tooth decay and gum disease.  The frequency of eye exams is based on your age, health, family medical history, use of contact lenses, and other factors. Follow your health care provider's recommendations for frequency of eye exams.  Eat a healthy diet. Foods like vegetables, fruits, whole grains, low-fat dairy products, and lean protein foods contain the nutrients you need without too many calories. Decrease your intake of foods high in solid fats, added sugars, and salt. Eat the right amount of calories for you.Get information about a proper diet from your health care provider, if necessary.  Regular physical exercise is one of the most important things you can do for your health. Most adults should get at least 150 minutes of moderate-intensity exercise (any activity that increases your heart rate and causes you to sweat) each week. In addition, most adults need muscle-strengthening exercises on 2 or more days a week.  Maintain a healthy weight. The body mass index (BMI) is a screening tool to identify possible weight problems. It provides an estimate of body fat based on height and weight. Your health care provider can find your BMI and can help you achieve or maintain a healthy weight.For adults 20 years and older:  A BMI below 18.5 is considered underweight.  A BMI of 18.5 to 24.9 is normal.  A BMI of 25 to 29.9 is considered overweight.  A BMI of 30 and above is considered obese.  Maintain normal blood lipids and cholesterol levels by exercising and minimizing your intake of saturated fat. Eat a balanced diet with plenty of fruit and vegetables. Blood tests for lipids and cholesterol should begin at age 8 and be repeated every 5 years. If your lipid or cholesterol levels are high, you are over 50, or you are at high risk for heart  disease, you may need your cholesterol levels checked more frequently.Ongoing high lipid and cholesterol levels should be treated with medicines if diet and exercise are not working.  If you smoke, find out from your health care provider how to quit. If you do not use tobacco, do not start.  Lung cancer screening is recommended for adults aged 31-80 years who are at high risk for developing lung cancer because of a history of smoking. A yearly low-dose CT scan of the lungs is recommended for people who have at least a 30-pack-year history of smoking and are a current smoker or have quit within the past 15 years. A pack year of smoking is smoking an average of 1 pack of cigarettes a day for 1 year (for example: 1 pack a day for 30 years or 2 packs a day for 15 years). Yearly screening should continue until the smoker has stopped smoking for at least 15 years. Yearly screening should be stopped for people who develop a health problem that would prevent them from having lung cancer treatment.  If you are pregnant, do not drink alcohol. If you are  breastfeeding, be very cautious about drinking alcohol. If you are not pregnant and choose to drink alcohol, do not have more than 1 drink per day. One drink is considered to be 12 ounces (355 mL) of beer, 5 ounces (148 mL) of wine, or 1.5 ounces (44 mL) of liquor.  Avoid use of street drugs. Do not share needles with anyone. Ask for help if you need support or instructions about stopping the use of drugs.  High blood pressure causes heart disease and increases the risk of stroke. Your blood pressure should be checked at least every 1 to 2 years. Ongoing high blood pressure should be treated with medicines if weight loss and exercise do not work.  If you are 7-67 years old, ask your health care provider if you should take aspirin to prevent strokes.  Diabetes screening involves taking a blood sample to check your fasting blood sugar level. This should be done  once every 3 years, after age 50, if you are within normal weight and without risk factors for diabetes. Testing should be considered at a younger age or be carried out more frequently if you are overweight and have at least 1 risk factor for diabetes.  Breast cancer screening is essential preventive care for women. You should practice "breast self-awareness." This means understanding the normal appearance and feel of your breasts and may include breast self-examination. Any changes detected, no matter how small, should be reported to a health care provider. Women in their 64s and 30s should have a clinical breast exam (CBE) by a health care provider as part of a regular health exam every 1 to 3 years. After age 44, women should have a CBE every year. Starting at age 26, women should consider having a mammogram (breast X-ray test) every year. Women who have a family history of breast cancer should talk to their health care provider about genetic screening. Women at a high risk of breast cancer should talk to their health care providers about having an MRI and a mammogram every year.  Breast cancer gene (BRCA)-related cancer risk assessment is recommended for women who have family members with BRCA-related cancers. BRCA-related cancers include breast, ovarian, tubal, and peritoneal cancers. Having family members with these cancers may be associated with an increased risk for harmful changes (mutations) in the breast cancer genes BRCA1 and BRCA2. Results of the assessment will determine the need for genetic counseling and BRCA1 and BRCA2 testing.  Routine pelvic exams to screen for cancer are no longer recommended for nonpregnant women who are considered low risk for cancer of the pelvic organs (ovaries, uterus, and vagina) and who do not have symptoms. Ask your health care provider if a screening pelvic exam is right for you.  If you have had past treatment for cervical cancer or a condition that could lead  to cancer, you need Pap tests and screening for cancer for at least 20 years after your treatment. If Pap tests have been discontinued, your risk factors (such as having a new sexual partner) need to be reassessed to determine if screening should be resumed. Some women have medical problems that increase the chance of getting cervical cancer. In these cases, your health care provider may recommend more frequent screening and Pap tests.  The HPV test is an additional test that may be used for cervical cancer screening. The HPV test looks for the virus that can cause the cell changes on the cervix. The cells collected during the Pap test can  be tested for HPV. The HPV test could be used to screen women aged 17 years and older, and should be used in women of any age who have unclear Pap test results. After the age of 32, women should have HPV testing at the same frequency as a Pap test.  Colorectal cancer can be detected and often prevented. Most routine colorectal cancer screening begins at the age of 58 years and continues through age 29 years. However, your health care provider may recommend screening at an earlier age if you have risk factors for colon cancer. On a yearly basis, your health care provider may provide home test kits to check for hidden blood in the stool. Use of a small camera at the end of a tube, to directly examine the colon (sigmoidoscopy or colonoscopy), can detect the earliest forms of colorectal cancer. Talk to your health care provider about this at age 11, when routine screening begins. Direct exam of the colon should be repeated every 5-10 years through age 70 years, unless early forms of pre-cancerous polyps or small growths are found.  People who are at an increased risk for hepatitis B should be screened for this virus. You are considered at high risk for hepatitis B if:  You were born in a country where hepatitis B occurs often. Talk with your health care provider about which  countries are considered high risk.  Your parents were born in a high-risk country and you have not received a shot to protect against hepatitis B (hepatitis B vaccine).  You have HIV or AIDS.  You use needles to inject street drugs.  You live with, or have sex with, someone who has hepatitis B.  You get hemodialysis treatment.  You take certain medicines for conditions like cancer, organ transplantation, and autoimmune conditions.  Hepatitis C blood testing is recommended for all people born from [birth year] through 1966 and any individual with known risks for hepatitis C.  Practice safe sex. Use condoms and avoid high-risk sexual practices to reduce the spread of sexually transmitted infections (STIs). STIs include gonorrhea, chlamydia, syphilis, trichomonas, herpes, HPV, and human immunodeficiency virus (HIV). Herpes, HIV, and HPV are viral illnesses that have no cure. They can result in disability, cancer, and death.  You should be screened for sexually transmitted illnesses (STIs) including gonorrhea and chlamydia if:  You are sexually active and are younger than 24 years.  You are older than 24 years and your health care provider tells you that you are at risk for this type of infection.  Your sexual activity has changed since you were last screened and you are at an increased risk for chlamydia or gonorrhea. Ask your health care provider if you are at risk.  If you are at risk of being infected with HIV, it is recommended that you take a prescription medicine daily to prevent HIV infection. This is called preexposure prophylaxis (PrEP). You are considered at risk if:  You are a heterosexual woman, are sexually active, and are at increased risk for HIV infection.  You take drugs by injection.  You are sexually active with a partner who has HIV.  Talk with your health care provider about whether you are at high risk of being infected with HIV. If you choose to begin PrEP, you should  first be tested for HIV. You should then be tested every 3 months for as long as you are taking PrEP.  Osteoporosis is a disease in which the bones lose minerals and  strength with aging. This can result in serious bone fractures or breaks. The risk of osteoporosis can be identified using a bone density scan. Women ages 3 years and over and women at risk for fractures or osteoporosis should discuss screening with their health care providers. Ask your health care provider whether you should take a calcium supplement or vitamin D to reduce the rate of osteoporosis.  Menopause can be associated with physical symptoms and risks. Hormone replacement therapy is available to decrease symptoms and risks. You should talk to your health care provider about whether hormone replacement therapy is right for you.  Use sunscreen. Apply sunscreen liberally and repeatedly throughout the day. You should seek shade when your shadow is shorter than you. Protect yourself by wearing long sleeves, pants, a wide-brimmed hat, and sunglasses year round, whenever you are outdoors.  Once a month, do a whole body skin exam, using a mirror to look at the skin on your back. Tell your health care provider of new moles, moles that have irregular borders, moles that are larger than a pencil eraser, or moles that have changed in shape or color.  Stay current with required vaccines (immunizations).  Influenza vaccine. All adults should be immunized every year.  Tetanus, diphtheria, and acellular pertussis (Td, Tdap) vaccine. Pregnant women should receive 1 dose of Tdap vaccine during each pregnancy. The dose should be obtained regardless of the length of time since the last dose. Immunization is preferred during the 27th-36th week of gestation. An adult who has not previously received Tdap or who does not know her vaccine status should receive 1 dose of Tdap. This initial dose should be followed by tetanus and diphtheria toxoids (Td)  booster doses every 10 years. Adults with an unknown or incomplete history of completing a 3-dose immunization series with Td-containing vaccines should begin or complete a primary immunization series including a Tdap dose. Adults should receive a Td booster every 10 years.  Varicella vaccine. An adult without evidence of immunity to varicella should receive 2 doses or a second dose if she has previously received 1 dose. Pregnant females who do not have evidence of immunity should receive the first dose after pregnancy. This first dose should be obtained before leaving the health care facility. The second dose should be obtained 4-8 weeks after the first dose.  Human papillomavirus (HPV) vaccine. Females aged 13-26 years who have not received the vaccine previously should obtain the 3-dose series. The vaccine is not recommended for use in pregnant females. However, pregnancy testing is not needed before receiving a dose. If a female is found to be pregnant after receiving a dose, no treatment is needed. In that case, the remaining doses should be delayed until after the pregnancy. Immunization is recommended for any person with an immunocompromised condition through the age of 67 years if she did not get any or all doses earlier. During the 3-dose series, the second dose should be obtained 4-8 weeks after the first dose. The third dose should be obtained 24 weeks after the first dose and 16 weeks after the second dose.  Zoster vaccine. One dose is recommended for adults aged 60 years or older unless certain conditions are present.  Measles, mumps, and rubella (MMR) vaccine. Adults born before [birth year] generally are considered immune to measles and mumps. Adults born in [birth year] or later should have 1 or more doses of MMR vaccine unless there is a contraindication to the vaccine or there is laboratory evidence of immunity to  each of the three diseases. A routine second dose of MMR vaccine should be obtained at least  28 days after the first dose for students attending postsecondary schools, health care workers, or international travelers. People who received inactivated measles vaccine or an unknown type of measles vaccine during 1963-1967 should receive 2 doses of MMR vaccine. People who received inactivated mumps vaccine or an unknown type of mumps vaccine before 1979 and are at high risk for mumps infection should consider immunization with 2 doses of MMR vaccine. For females of childbearing age, rubella immunity should be determined. If there is no evidence of immunity, females who are not pregnant should be vaccinated. If there is no evidence of immunity, females who are pregnant should delay immunization until after pregnancy. Unvaccinated health care workers born before [birth year] who lack laboratory evidence of measles, mumps, or rubella immunity or laboratory confirmation of disease should consider measles and mumps immunization with 2 doses of MMR vaccine or rubella immunization with 1 dose of MMR vaccine.  Pneumococcal 13-valent conjugate (PCV13) vaccine. When indicated, a person who is uncertain of her immunization history and has no record of immunization should receive the PCV13 vaccine. An adult aged 71 years or older who has certain medical conditions and has not been previously immunized should receive 1 dose of PCV13 vaccine. This PCV13 should be followed with a dose of pneumococcal polysaccharide (PPSV23) vaccine. The PPSV23 vaccine dose should be obtained at least 8 weeks after the dose of PCV13 vaccine. An adult aged 73 years or older who has certain medical conditions and previously received 1 or more doses of PPSV23 vaccine should receive 1 dose of PCV13. The PCV13 vaccine dose should be obtained 1 or more years after the last PPSV23 vaccine dose.  Pneumococcal polysaccharide (PPSV23) vaccine. When PCV13 is also indicated, PCV13 should be obtained first. All adults aged 23 years and older should be  immunized. An adult younger than age 75 years who has certain medical conditions should be immunized. Any person who resides in a nursing home or long-term care facility should be immunized. An adult smoker should be immunized. People with an immunocompromised condition and certain other conditions should receive both PCV13 and PPSV23 vaccines. People with human immunodeficiency virus (HIV) infection should be immunized as soon as possible after diagnosis. Immunization during chemotherapy or radiation therapy should be avoided. Routine use of PPSV23 vaccine is not recommended for American Indians, [place]atives, or people younger than 65 years unless there are medical conditions that require PPSV23 vaccine. When indicated, people who have unknown immunization and have no record of immunization should receive PPSV23 vaccine. One-time revaccination 5 years after the first dose of PPSV23 is recommended for people aged 19-64 years who have chronic kidney failure, nephrotic syndrome, asplenia, or immunocompromised conditions. People who received 1-2 doses of PPSV23 before age 40 years should receive another dose of PPSV23 vaccine at age 71 years or later if at least 5 years have passed since the previous dose. Doses of PPSV23 are not needed for people immunized with PPSV23 at or after age 1 years.  Meningococcal vaccine. Adults with asplenia or persistent complement component deficiencies should receive 2 doses of quadrivalent meningococcal conjugate (MenACWY-D) vaccine. The doses should be obtained at least 2 months apart. Microbiologists working with certain meningococcal bacteria, [place] recruits, people at risk during an outbreak, and people who travel to or live in countries with a high rate of meningitis should be immunized. A first-year college student up through age  21 years who is living in a residence hall should receive a dose if she did not receive a dose on or after her 16th birthday. Adults who have  certain high-risk conditions should receive one or more doses of vaccine.  Hepatitis A vaccine. Adults who wish to be protected from this disease, have certain high-risk conditions, work with hepatitis A-infected animals, work in hepatitis A research labs, or travel to or work in countries with a high rate of hepatitis A should be immunized. Adults who were previously unvaccinated and who anticipate close contact with an international adoptee during the first 60 days after arrival in the [place] from a country with a high rate of hepatitis A should be immunized.  Hepatitis B vaccine. Adults who wish to be protected from this disease, have certain high-risk conditions, may be exposed to blood or other infectious body fluids, are household contacts or sex partners of hepatitis B positive people, are clients or workers in certain care facilities, or travel to or work in countries with a high rate of hepatitis B should be immunized.  Haemophilus influenzae type b (Hib) vaccine. A previously unvaccinated person with asplenia or sickle cell disease or having a scheduled splenectomy should receive 1 dose of Hib vaccine. Regardless of previous immunization, a recipient of a hematopoietic stem cell transplant should receive a 3-dose series 6-12 months after her successful transplant. Hib vaccine is not recommended for adults with HIV infection. Preventive Services / Frequency Ages 29 to 62 years  Blood pressure check.** / Every 1 to 2 years.  Lipid and cholesterol check.** / Every 5 years beginning at age 67.  Clinical breast exam.** / Every 3 years for women in their 42s and 24s.  BRCA-related cancer risk assessment.** / For women who have family members with a BRCA-related cancer (breast, ovarian, tubal, or peritoneal cancers).  Pap test.** / Every 2 years from ages 36 through 30. Every 3 years starting at age 40 through age 72 or 41 with a history of 3 consecutive normal Pap tests.  HPV  screening.** / Every 3 years from ages 67 through ages 8 to 50 with a history of 3 consecutive normal Pap tests.  Hepatitis C blood test.** / For any individual with known risks for hepatitis C.  Skin self-exam. / Monthly.  Influenza vaccine. / Every year.  Tetanus, diphtheria, and acellular pertussis (Tdap, Td) vaccine.** / Consult your health care provider. Pregnant women should receive 1 dose of Tdap vaccine during each pregnancy. 1 dose of Td every 10 years.  Varicella vaccine.** / Consult your health care provider. Pregnant females who do not have evidence of immunity should receive the first dose after pregnancy.  HPV vaccine. / 3 doses over 6 months, if 55 and younger. The vaccine is not recommended for use in pregnant females. However, pregnancy testing is not needed before receiving a dose.  Measles, mumps, rubella (MMR) vaccine.** / You need at least 1 dose of MMR if you were born in 1957 or later. You may also need a 2nd dose. For females of childbearing age, rubella immunity should be determined. If there is no evidence of immunity, females who are not pregnant should be vaccinated. If there is no evidence of immunity, females who are pregnant should delay immunization until after pregnancy.  Pneumococcal 13-valent conjugate (PCV13) vaccine.** / Consult your health care provider.  Pneumococcal polysaccharide (PPSV23) vaccine.** / 1 to 2 doses if you smoke cigarettes or if you have certain conditions.  Meningococcal  vaccine.** / 1 dose if you are age 44 to 110 years and a Market researcher living in a residence hall, or have one of several medical conditions, you need to get vaccinated against meningococcal disease. You may also need additional booster doses.  Hepatitis A vaccine.** / Consult your health care provider.  Hepatitis B vaccine.** / Consult your health care provider.  Haemophilus influenzae type b (Hib) vaccine.** / Consult your health care provider. Ages 27  to 80 years  Blood pressure check.** / Every 1 to 2 years.  Lipid and cholesterol check.** / Every 5 years beginning at age 58 years.  Lung cancer screening. / Every year if you are aged 86-80 years and have a 30-pack-year history of smoking and currently smoke or have quit within the past 15 years. Yearly screening is stopped once you have quit smoking for at least 15 years or develop a health problem that would prevent you from having lung cancer treatment.  Clinical breast exam.** / Every year after age 40 years.  BRCA-related cancer risk assessment.** / For women who have family members with a BRCA-related cancer (breast, ovarian, tubal, or peritoneal cancers).  Mammogram.** / Every year beginning at age 66 years and continuing for as long as you are in good health. Consult with your health care provider.  Pap test.** / Every 3 years starting at age 48 years through age 67 or 64 years with a history of 3 consecutive normal Pap tests.  HPV screening.** / Every 3 years from ages 36 years through ages 59 to 51 years with a history of 3 consecutive normal Pap tests.  Fecal occult blood test (FOBT) of stool. / Every year beginning at age 42 years and continuing until age 62 years. You may not need to do this test if you get a colonoscopy every 10 years.  Flexible sigmoidoscopy or colonoscopy.** / Every 5 years for a flexible sigmoidoscopy or every 10 years for a colonoscopy beginning at age 49 years and continuing until age 19 years.  Hepatitis C blood test.** / For all people born from [birth year] through 1966 and any individual with known risks for hepatitis C.  Skin self-exam. / Monthly.  Influenza vaccine. / Every year.  Tetanus, diphtheria, and acellular pertussis (Tdap/Td) vaccine.** / Consult your health care provider. Pregnant women should receive 1 dose of Tdap vaccine during each pregnancy. 1 dose of Td every 10 years.  Varicella vaccine.** / Consult your health care provider.  Pregnant females who do not have evidence of immunity should receive the first dose after pregnancy.  Zoster vaccine.** / 1 dose for adults aged 77 years or older.  Measles, mumps, rubella (MMR) vaccine.** / You need at least 1 dose of MMR if you were born in 1957 or later. You may also need a 2nd dose. For females of childbearing age, rubella immunity should be determined. If there is no evidence of immunity, females who are not pregnant should be vaccinated. If there is no evidence of immunity, females who are pregnant should delay immunization until after pregnancy.  Pneumococcal 13-valent conjugate (PCV13) vaccine.** / Consult your health care provider.  Pneumococcal polysaccharide (PPSV23) vaccine.** / 1 to 2 doses if you smoke cigarettes or if you have certain conditions.  Meningococcal vaccine.** / Consult your health care provider.  Hepatitis A vaccine.** / Consult your health care provider.  Hepatitis B vaccine.** / Consult your health care provider.  Haemophilus influenzae type b (Hib) vaccine.** / Consult your health care provider.  Ages 61 years and over  Blood pressure check.** / Every 1 to 2 years.  Lipid and cholesterol check.** / Every 5 years beginning at age 68 years.  Lung cancer screening. / Every year if you are aged 53-80 years and have a 30-pack-year history of smoking and currently smoke or have quit within the past 15 years. Yearly screening is stopped once you have quit smoking for at least 15 years or develop a health problem that would prevent you from having lung cancer treatment.  Clinical breast exam.** / Every year after age 76 years.  BRCA-related cancer risk assessment.** / For women who have family members with a BRCA-related cancer (breast, ovarian, tubal, or peritoneal cancers).  Mammogram.** / Every year beginning at age 35 years and continuing for as long as you are in good health. Consult with your health care provider.  Pap test.** / Every 3  years starting at age 55 years through age 46 or 38 years with 3 consecutive normal Pap tests. Testing can be stopped between 65 and 70 years with 3 consecutive normal Pap tests and no abnormal Pap or HPV tests in the past 10 years.  HPV screening.** / Every 3 years from ages 25 years through ages 52 or 89 years with a history of 3 consecutive normal Pap tests. Testing can be stopped between 65 and 70 years with 3 consecutive normal Pap tests and no abnormal Pap or HPV tests in the past 10 years.  Fecal occult blood test (FOBT) of stool. / Every year beginning at age 68 years and continuing until age 93 years. You may not need to do this test if you get a colonoscopy every 10 years.  Flexible sigmoidoscopy or colonoscopy.** / Every 5 years for a flexible sigmoidoscopy or every 10 years for a colonoscopy beginning at age 27 years and continuing until age 38 years.  Hepatitis C blood test.** / For all people born from [birth year] through 1966 and any individual with known risks for hepatitis C.  Osteoporosis screening.** / A one-time screening for women ages 2 years and over and women at risk for fractures or osteoporosis.  Skin self-exam. / Monthly.  Influenza vaccine. / Every year.  Tetanus, diphtheria, and acellular pertussis (Tdap/Td) vaccine.** / 1 dose of Td every 10 years.  Varicella vaccine.** / Consult your health care provider.  Zoster vaccine.** / 1 dose for adults aged 55 years or older.  Pneumococcal 13-valent conjugate (PCV13) vaccine.** / Consult your health care provider.  Pneumococcal polysaccharide (PPSV23) vaccine.** / 1 dose for all adults aged 95 years and older.  Meningococcal vaccine.** / Consult your health care provider.  Hepatitis A vaccine.** / Consult your health care provider.  Hepatitis B vaccine.** / Consult your health care provider.  Haemophilus influenzae type b (Hib) vaccine.** / Consult your health care provider. ** Family history and personal history of  risk and conditions may change your health care provider's recommendations. Document Released: 12/16/2001 Document Revised: 03/06/2014 Document Reviewed: 03/17/2011 [facility] Patient Information 2015 [place], [place]. This information is not intended to replace advice given to you by your health care provider. Make sure you discuss any questions you have with your health care provider.

## 2014-11-28 LAB — CYTOLOGY - PAP

## 2014-11-29 ENCOUNTER — Ambulatory Visit (HOSPITAL_COMMUNITY)
Admission: RE | Admit: 2014-11-29 | Discharge: 2014-11-29 | Disposition: A | Discharge status: Home / Self Care | Payer: BLUE CROSS/BLUE SHIELD | Source: Ambulatory Visit | Attending: Obstetrics & Gynecology | Admitting: Obstetrics & Gynecology

## 2014-11-29 DIAGNOSIS — N920 Excessive and frequent menstruation with regular cycle: Secondary | ICD-10-CM | POA: Diagnosis present

## 2014-12-14 NOTE — Patient Instructions (Signed)
   Your procedure is scheduled on:  Tuesday, March 1  Enter through the [employer] of [facility] at: 11:30 AM Pick up the phone at the desk and dial [phone] and inform us of your arrival.  Please call this number if you have any problems the morning of surgery: [phone]  Remember: Do not eat food after midnight: Monday Do not drink clear liquids after: 9 AM Tuesday, day of surgery Take these medicines the morning of surgery with a SIP OF WATER:  Do not wear jewelry, make-up, or FINGER nail polish No metal in your hair or on your body. Do not wear lotions, powders, perfumes.  You may wear deodorant.  Do not bring valuables to the hospital. Contacts, dentures or bridgework may not be worn into surgery.  Leave suitcase in the car. After Surgery it may be brought to your room. For patients being admitted to the hospital, checkout time is 11:00am the day of discharge.

## 2014-12-18 ENCOUNTER — Inpatient Hospital Stay (HOSPITAL_COMMUNITY)
Admission: RE | Admit: 2014-12-18 | Discharge: 2014-12-18 | Disposition: A | Discharge status: Home / Self Care | Payer: BLUE CROSS/BLUE SHIELD | Source: Ambulatory Visit

## 2014-12-26 NOTE — Patient Instructions (Addendum)
   Your procedure is scheduled on:  Tuesday, March 1  Enter through the [employer] of [facility] at:  11:30 AM Pick up the phone at the desk and dial [phone] and inform us of your arrival.  Please call this number if you have any problems the morning of surgery: [phone]  Remember: Do not eat food after midnight: Monday Do not drink clear liquids after: 9 AM Tuesday, day of surgery Take these medicines the morning of surgery with a SIP OF WATER: synthroid.  Bring albuterol inhaler with you on day of surgery.  Do not wear jewelry, make-up, or FINGER nail polish No metal in your hair or on your body. Do not wear lotions, powders, perfumes.  You may wear deodorant.  Do not bring valuables to the hospital. Contacts, dentures or bridgework may not be worn into surgery.  Leave suitcase in the car. After Surgery it may be brought to your room. For patients being admitted to the hospital, checkout time is 11:00am the day of discharge.  Home with mother Renee Benjamin cell [phone]

## 2014-12-27 ENCOUNTER — Encounter (HOSPITAL_COMMUNITY): Payer: Self-pay

## 2014-12-27 ENCOUNTER — Encounter (HOSPITAL_COMMUNITY)
Admission: RE | Admit: 2014-12-27 | Discharge: 2014-12-27 | Disposition: A | Discharge status: Home / Self Care | Payer: BLUE CROSS/BLUE SHIELD | Source: Ambulatory Visit | Attending: Obstetrics & Gynecology | Admitting: Obstetrics & Gynecology

## 2014-12-27 DIAGNOSIS — Z01812 Encounter for preprocedural laboratory examination: Secondary | ICD-10-CM | POA: Insufficient documentation

## 2014-12-27 HISTORY — DX: Headache, unspecified: R51.9

## 2014-12-27 HISTORY — DX: Hypothyroidism, unspecified: E03.9

## 2014-12-27 HISTORY — DX: Headache: R51

## 2014-12-27 HISTORY — DX: Anemia, unspecified: D64.9

## 2014-12-27 LAB — CBC
HCT: 36.7 % (ref 36.0–46.0)
Hemoglobin: 12.6 g/dL (ref 12.0–15.0)
MCH: 31.1 pg (ref 26.0–34.0)
MCHC: 34.3 g/dL (ref 30.0–36.0)
MCV: 90.6 fL (ref 78.0–100.0)
Platelets: 235 10*3/uL (ref 150–400)
RBC: 4.05 MIL/uL (ref 3.87–5.11)
RDW: 12.6 % (ref 11.5–15.5)
WBC: 6.9 10*3/uL (ref 4.0–10.5)

## 2014-12-27 LAB — TYPE AND SCREEN
ABO/RH(D): A POS
Antibody Screen: NEGATIVE

## 2014-12-27 LAB — ABO/RH: ABO/RH(D): A POS

## 2014-12-29 ENCOUNTER — Encounter (INDEPENDENT_AMBULATORY_CARE_PROVIDER_SITE_OTHER): Payer: Self-pay | Admitting: *Deleted

## 2014-12-29 DIAGNOSIS — N938 Other specified abnormal uterine and vaginal bleeding: Secondary | ICD-10-CM

## 2014-12-29 NOTE — Progress Notes (Signed)
Disability forms for surgery filled out and given to patient.

## 2015-01-01 MED ORDER — DEXTROSE 5 % IV SOLN
2.0000 g | INTRAVENOUS | Status: AC
  Administered 2015-01-02: 2 g via INTRAVENOUS
  Filled 2015-01-01: qty 2

## 2015-01-02 ENCOUNTER — Encounter (HOSPITAL_COMMUNITY): Payer: Self-pay | Admitting: Anesthesiology

## 2015-01-02 ENCOUNTER — Encounter (HOSPITAL_COMMUNITY)
Admission: RE | Disposition: A | Discharge status: Home / Self Care | Payer: Self-pay | Source: Ambulatory Visit | Attending: Obstetrics & Gynecology

## 2015-01-02 ENCOUNTER — Ambulatory Visit (HOSPITAL_COMMUNITY): Payer: BLUE CROSS/BLUE SHIELD | Admitting: Certified Registered Nurse Anesthetist

## 2015-01-02 ENCOUNTER — Ambulatory Visit (HOSPITAL_COMMUNITY)
Admission: RE | Admit: 2015-01-02 | Discharge: 2015-01-03 | Disposition: A | Discharge status: Home / Self Care | Payer: BLUE CROSS/BLUE SHIELD | Source: Ambulatory Visit | Attending: Obstetrics & Gynecology | Admitting: Obstetrics & Gynecology

## 2015-01-02 DIAGNOSIS — Z79899 Other long term (current) drug therapy: Secondary | ICD-10-CM | POA: Insufficient documentation

## 2015-01-02 DIAGNOSIS — D649 Anemia, unspecified: Secondary | ICD-10-CM | POA: Insufficient documentation

## 2015-01-02 DIAGNOSIS — R102 Pelvic and perineal pain: Secondary | ICD-10-CM | POA: Diagnosis present

## 2015-01-02 DIAGNOSIS — J45909 Unspecified asthma, uncomplicated: Secondary | ICD-10-CM | POA: Diagnosis not present

## 2015-01-02 DIAGNOSIS — Z87891 Personal history of nicotine dependence: Secondary | ICD-10-CM | POA: Insufficient documentation

## 2015-01-02 DIAGNOSIS — E039 Hypothyroidism, unspecified: Secondary | ICD-10-CM | POA: Insufficient documentation

## 2015-01-02 DIAGNOSIS — E282 Polycystic ovarian syndrome: Secondary | ICD-10-CM | POA: Diagnosis not present

## 2015-01-02 DIAGNOSIS — N920 Excessive and frequent menstruation with regular cycle: Secondary | ICD-10-CM | POA: Diagnosis present

## 2015-01-02 DIAGNOSIS — G8929 Other chronic pain: Secondary | ICD-10-CM | POA: Diagnosis not present

## 2015-01-02 DIAGNOSIS — Z90710 Acquired absence of both cervix and uterus: Secondary | ICD-10-CM | POA: Diagnosis present

## 2015-01-02 HISTORY — PX: LAPAROSCOPIC HYSTERECTOMY: SHX1926

## 2015-01-02 HISTORY — PX: LAPAROSCOPIC BILATERAL SALPINGECTOMY: SHX5889

## 2015-01-02 HISTORY — PX: ABDOMINAL HYSTERECTOMY: SHX81

## 2015-01-02 LAB — PREGNANCY, URINE: Preg Test, Ur: NEGATIVE

## 2015-01-02 SURGERY — HYSTERECTOMY, TOTAL, LAPAROSCOPIC
Anesthesia: General | Site: Abdomen

## 2015-01-02 MED ORDER — LACTATED RINGERS IV SOLN
INTRAVENOUS | Status: DC
  Administered 2015-01-02 (×2): via INTRAVENOUS

## 2015-01-02 MED ORDER — DIPHENHYDRAMINE HCL 12.5 MG/5ML PO ELIX: 12.5000 mg | ORAL_SOLUTION | Freq: Four times a day (QID) | ORAL | Status: DC | PRN

## 2015-01-02 MED ORDER — GLYCOPYRROLATE 0.2 MG/ML IJ SOLN
INTRAMUSCULAR | Status: DC | PRN
  Administered 2015-01-02: 0.6 mg via INTRAVENOUS

## 2015-01-02 MED ORDER — PHENYLEPHRINE 40 MCG/ML (10ML) SYRINGE FOR IV PUSH (FOR BLOOD PRESSURE SUPPORT)
PREFILLED_SYRINGE | INTRAVENOUS | Status: AC
  Filled 2015-01-02: qty 10

## 2015-01-02 MED ORDER — FENTANYL CITRATE 0.05 MG/ML IJ SOLN
INTRAMUSCULAR | Status: AC
  Filled 2015-01-02: qty 2

## 2015-01-02 MED ORDER — EPHEDRINE 5 MG/ML INJ
INTRAVENOUS | Status: AC
  Filled 2015-01-02: qty 10

## 2015-01-02 MED ORDER — HYDROMORPHONE HCL 2 MG PO TABS: 4.0000 mg | ORAL_TABLET | ORAL | Status: DC | PRN

## 2015-01-02 MED ORDER — FENTANYL CITRATE 0.05 MG/ML IJ SOLN
INTRAMUSCULAR | Status: AC
  Filled 2015-01-02: qty 5

## 2015-01-02 MED ORDER — LACTATED RINGERS IR SOLN
Status: DC | PRN
  Administered 2015-01-02: 3000 mL

## 2015-01-02 MED ORDER — IBUPROFEN 600 MG PO TABS
600.0000 mg | ORAL_TABLET | Freq: Four times a day (QID) | ORAL | Status: DC | PRN
  Administered 2015-01-03: 600 mg via ORAL
  Filled 2015-01-02: qty 1

## 2015-01-02 MED ORDER — ROCURONIUM BROMIDE 100 MG/10ML IV SOLN
INTRAVENOUS | Status: DC | PRN
  Administered 2015-01-02: 10 mg via INTRAVENOUS
  Administered 2015-01-02: 50 mg via INTRAVENOUS

## 2015-01-02 MED ORDER — DEXAMETHASONE SODIUM PHOSPHATE 4 MG/ML IJ SOLN
INTRAMUSCULAR | Status: AC
  Filled 2015-01-02: qty 1

## 2015-01-02 MED ORDER — SODIUM CHLORIDE 0.9 % IJ SOLN: 9.0000 mL | INTRAMUSCULAR | Status: DC | PRN

## 2015-01-02 MED ORDER — LIDOCAINE HCL (CARDIAC) 20 MG/ML IV SOLN
INTRAVENOUS | Status: AC
  Filled 2015-01-02: qty 5

## 2015-01-02 MED ORDER — FENTANYL CITRATE 0.05 MG/ML IJ SOLN
INTRAMUSCULAR | Status: DC | PRN
  Administered 2015-01-02 (×5): 50 ug via INTRAVENOUS
  Administered 2015-01-02: 100 ug via INTRAVENOUS

## 2015-01-02 MED ORDER — BUPIVACAINE-EPINEPHRINE (PF) 0.5% -1:200000 IJ SOLN
INTRAMUSCULAR | Status: DC | PRN
  Administered 2015-01-02: 10 mL

## 2015-01-02 MED ORDER — PANTOPRAZOLE SODIUM 40 MG PO TBEC: 40.0000 mg | DELAYED_RELEASE_TABLET | Freq: Every day | ORAL | Status: DC

## 2015-01-02 MED ORDER — NEOSTIGMINE METHYLSULFATE 10 MG/10ML IV SOLN
INTRAVENOUS | Status: DC | PRN
  Administered 2015-01-02: 3 mg via INTRAVENOUS

## 2015-01-02 MED ORDER — HYDROMORPHONE HCL 2 MG PO TABS
2.0000 mg | ORAL_TABLET | ORAL | Status: DC | PRN
  Administered 2015-01-03 (×2): 2 mg via ORAL
  Filled 2015-01-02 (×2): qty 1

## 2015-01-02 MED ORDER — PROMETHAZINE HCL 25 MG/ML IJ SOLN
6.2500 mg | INTRAMUSCULAR | Status: DC | PRN
  Administered 2015-01-02: 6.25 mg via INTRAVENOUS

## 2015-01-02 MED ORDER — KETOROLAC TROMETHAMINE 30 MG/ML IJ SOLN: 30.0000 mg | Freq: Once | INTRAMUSCULAR | Status: DC

## 2015-01-02 MED ORDER — NALOXONE HCL 0.4 MG/ML IJ SOLN: 0.4000 mg | INTRAMUSCULAR | Status: DC | PRN

## 2015-01-02 MED ORDER — KETOROLAC TROMETHAMINE 30 MG/ML IJ SOLN
INTRAMUSCULAR | Status: AC
  Filled 2015-01-02: qty 1

## 2015-01-02 MED ORDER — ONDANSETRON HCL 4 MG/2ML IJ SOLN: 4.0000 mg | Freq: Four times a day (QID) | INTRAMUSCULAR | Status: DC | PRN

## 2015-01-02 MED ORDER — DOCUSATE SODIUM 100 MG PO CAPS
100.0000 mg | ORAL_CAPSULE | Freq: Two times a day (BID) | ORAL | Status: DC
  Administered 2015-01-02: 100 mg via ORAL
  Filled 2015-01-02: qty 1

## 2015-01-02 MED ORDER — MIDAZOLAM HCL 2 MG/2ML IJ SOLN
INTRAMUSCULAR | Status: DC | PRN
  Administered 2015-01-02: 2 mg via INTRAVENOUS

## 2015-01-02 MED ORDER — HYDROMORPHONE HCL 1 MG/ML IJ SOLN
INTRAMUSCULAR | Status: AC
  Filled 2015-01-02: qty 1

## 2015-01-02 MED ORDER — HYDROMORPHONE 0.3 MG/ML IV SOLN
INTRAVENOUS | Status: DC
  Administered 2015-01-02: 2.4 mg via INTRAVENOUS
  Administered 2015-01-02: 18:00:00 via INTRAVENOUS
  Administered 2015-01-03: 1.5 mg via INTRAVENOUS
  Administered 2015-01-03: 1.2 mg via INTRAVENOUS
  Filled 2015-01-02: qty 25

## 2015-01-02 MED ORDER — KETOROLAC TROMETHAMINE 30 MG/ML IJ SOLN: 30.0000 mg | Freq: Once | INTRAMUSCULAR | Status: DC | PRN

## 2015-01-02 MED ORDER — BUPIVACAINE-EPINEPHRINE (PF) 0.5% -1:200000 IJ SOLN
INTRAMUSCULAR | Status: AC
  Filled 2015-01-02: qty 30

## 2015-01-02 MED ORDER — MEPERIDINE HCL 25 MG/ML IJ SOLN: 6.2500 mg | INTRAMUSCULAR | Status: DC | PRN

## 2015-01-02 MED ORDER — PROMETHAZINE HCL 25 MG/ML IJ SOLN
INTRAMUSCULAR | Status: AC
  Administered 2015-01-02: 6.25 mg via INTRAVENOUS
  Filled 2015-01-02: qty 1

## 2015-01-02 MED ORDER — LIDOCAINE HCL (CARDIAC) 20 MG/ML IV SOLN
INTRAVENOUS | Status: DC | PRN
  Administered 2015-01-02: 30 mg via INTRAVENOUS
  Administered 2015-01-02: 50 mg via INTRAVENOUS

## 2015-01-02 MED ORDER — PROPOFOL 10 MG/ML IV BOLUS
INTRAVENOUS | Status: DC | PRN
  Administered 2015-01-02 (×3): 50 mg via INTRAVENOUS
  Administered 2015-01-02: 200 mg via INTRAVENOUS

## 2015-01-02 MED ORDER — ONDANSETRON HCL 4 MG/2ML IJ SOLN
INTRAMUSCULAR | Status: AC
  Filled 2015-01-02: qty 2

## 2015-01-02 MED ORDER — PHENYLEPHRINE HCL 10 MG/ML IJ SOLN
INTRAMUSCULAR | Status: DC | PRN
  Administered 2015-01-02 (×2): 40 mg via INTRAVENOUS

## 2015-01-02 MED ORDER — MENTHOL 3 MG MT LOZG: 1.0000 | LOZENGE | OROMUCOSAL | Status: DC | PRN

## 2015-01-02 MED ORDER — ONDANSETRON HCL 4 MG PO TABS: 4.0000 mg | ORAL_TABLET | Freq: Four times a day (QID) | ORAL | Status: DC | PRN

## 2015-01-02 MED ORDER — ONDANSETRON HCL 4 MG/2ML IJ SOLN
4.0000 mg | Freq: Four times a day (QID) | INTRAMUSCULAR | Status: DC | PRN
Start: 2015-01-02 — End: 2015-01-03

## 2015-01-02 MED ORDER — SCOPOLAMINE 1 MG/3DAYS TD PT72
1.0000 | MEDICATED_PATCH | Freq: Once | TRANSDERMAL | Status: DC
  Administered 2015-01-02: 1.5 mg via TRANSDERMAL

## 2015-01-02 MED ORDER — EPHEDRINE SULFATE 50 MG/ML IJ SOLN
INTRAMUSCULAR | Status: DC | PRN
  Administered 2015-01-02 (×2): 5 mg via INTRAVENOUS

## 2015-01-02 MED ORDER — SCOPOLAMINE 1 MG/3DAYS TD PT72
MEDICATED_PATCH | TRANSDERMAL | Status: AC
  Administered 2015-01-02: 1.5 mg via TRANSDERMAL
  Filled 2015-01-02: qty 1

## 2015-01-02 MED ORDER — DEXAMETHASONE SODIUM PHOSPHATE 10 MG/ML IJ SOLN
INTRAMUSCULAR | Status: AC
  Filled 2015-01-02: qty 1

## 2015-01-02 MED ORDER — PROPOFOL 10 MG/ML IV BOLUS
INTRAVENOUS | Status: AC
  Filled 2015-01-02: qty 20

## 2015-01-02 MED ORDER — DIPHENHYDRAMINE HCL 50 MG/ML IJ SOLN: 12.5000 mg | Freq: Four times a day (QID) | INTRAMUSCULAR | Status: DC | PRN

## 2015-01-02 MED ORDER — MONTELUKAST SODIUM 10 MG PO TABS
10.0000 mg | ORAL_TABLET | Freq: Every day | ORAL | Status: DC
  Administered 2015-01-02: 10 mg via ORAL
  Filled 2015-01-02: qty 1

## 2015-01-02 MED ORDER — ALBUTEROL SULFATE (2.5 MG/3ML) 0.083% IN NEBU: 3.0000 mL | INHALATION_SOLUTION | Freq: Four times a day (QID) | RESPIRATORY_TRACT | Status: DC | PRN

## 2015-01-02 MED ORDER — HYDROMORPHONE HCL 1 MG/ML IJ SOLN
INTRAMUSCULAR | Status: DC | PRN
  Administered 2015-01-02 (×2): 1 mg via INTRAVENOUS

## 2015-01-02 MED ORDER — DEXAMETHASONE SODIUM PHOSPHATE 10 MG/ML IJ SOLN
INTRAMUSCULAR | Status: DC | PRN
  Administered 2015-01-02: 10 mg via INTRAVENOUS

## 2015-01-02 MED ORDER — LACTATED RINGERS IV SOLN
INTRAVENOUS | Status: DC
  Administered 2015-01-02: 11:00:00 via INTRAVENOUS

## 2015-01-02 MED ORDER — LACTATED RINGERS IV SOLN
INTRAVENOUS | Status: DC
  Administered 2015-01-02 – 2015-01-03 (×2): via INTRAVENOUS

## 2015-01-02 MED ORDER — FENTANYL CITRATE 0.05 MG/ML IJ SOLN: 25.0000 ug | INTRAMUSCULAR | Status: DC | PRN

## 2015-01-02 MED ORDER — MIDAZOLAM HCL 2 MG/2ML IJ SOLN
INTRAMUSCULAR | Status: AC
  Filled 2015-01-02: qty 2

## 2015-01-02 MED ORDER — LEVOTHYROXINE SODIUM 75 MCG PO TABS
75.0000 ug | ORAL_TABLET | Freq: Every day | ORAL | Status: DC
  Administered 2015-01-03: 75 ug via ORAL
  Filled 2015-01-02: qty 1

## 2015-01-02 MED ORDER — SIMETHICONE 80 MG PO CHEW: 80.0000 mg | CHEWABLE_TABLET | Freq: Four times a day (QID) | ORAL | Status: DC | PRN

## 2015-01-02 MED ORDER — HYDROMORPHONE HCL 1 MG/ML IJ SOLN: 0.2000 mg | INTRAMUSCULAR | Status: DC | PRN

## 2015-01-02 MED ORDER — ONDANSETRON HCL 4 MG/2ML IJ SOLN
INTRAMUSCULAR | Status: DC | PRN
  Administered 2015-01-02: 4 mg via INTRAVENOUS

## 2015-01-02 SURGICAL SUPPLY — 62 items
APPLICATOR COTTON TIP 6IN STRL (MISCELLANEOUS) ×6 IMPLANT
APPLICATOR SURGIFLO (MISCELLANEOUS) ×3 IMPLANT
BLADE SURG 15 STRL LF C SS BP (BLADE) ×2 IMPLANT
BLADE SURG 15 STRL SS (BLADE) ×1
CABLE HIGH FREQUENCY MONO STRZ (ELECTRODE) IMPLANT
CATH ROBINSON RED A/P 16FR (CATHETERS) IMPLANT
CLOTH BEACON ORANGE TIMEOUT ST (SAFETY) ×3 IMPLANT
CONT PATH 16OZ SNAP LID 3702 (MISCELLANEOUS) ×3 IMPLANT
COVER BACK TABLE 60X90IN (DRAPES) IMPLANT
COVER LIGHT HANDLE  1/PK (MISCELLANEOUS)
COVER LIGHT HANDLE 1/PK (MISCELLANEOUS) IMPLANT
COVER MAYO STAND STRL (DRAPES) ×3 IMPLANT
DEVICE SUTURE ENDOST 10MM (ENDOMECHANICALS) IMPLANT
DRAPE UNDERBUTTOCKS STRL (DRAPE) ×3 IMPLANT
DRSG COVADERM PLUS 2X2 (GAUZE/BANDAGES/DRESSINGS) ×6 IMPLANT
DRSG OPSITE POSTOP 3X4 (GAUZE/BANDAGES/DRESSINGS) ×3 IMPLANT
DURAPREP 26ML APPLICATOR (WOUND CARE) ×3 IMPLANT
ENDOSTITCH 0 SINGLE 48 (SUTURE) IMPLANT
EVACUATOR SMOKE 8.L (FILTER) ×3 IMPLANT
FORMULA NB 0-3 ENFAMIL (FORMULA) ×3 IMPLANT
GLOVE BIO SURGEON STRL SZ7 (GLOVE) ×15 IMPLANT
GLOVE BIOGEL PI IND STRL 7.0 (GLOVE) ×14 IMPLANT
GLOVE BIOGEL PI INDICATOR 7.0 (GLOVE) ×7
GLOVE ECLIPSE 7.0 STRL STRAW (GLOVE) ×3 IMPLANT
GOWN STRL REUS W/TWL LRG LVL3 (GOWN DISPOSABLE) ×21 IMPLANT
HEMOSTAT SURGICEL 2X3 (HEMOSTASIS) ×3 IMPLANT
HEMOSTAT SURGICEL 4X8 (HEMOSTASIS) ×3 IMPLANT
LIQUID BAND (GAUZE/BANDAGES/DRESSINGS) ×3 IMPLANT
NEEDLE INSUFFLATION 120MM (ENDOMECHANICALS) ×3 IMPLANT
NS IRRIG 1000ML POUR BTL (IV SOLUTION) ×3 IMPLANT
OCCLUDER COLPOPNEUMO (BALLOONS) ×3 IMPLANT
PACK LAPAROSCOPY BASIN (CUSTOM PROCEDURE TRAY) IMPLANT
PACK LAVH (CUSTOM PROCEDURE TRAY) ×3 IMPLANT
PAD POSITIONER PINK NONSTERILE (MISCELLANEOUS) ×3 IMPLANT
POUCH SPECIMEN RETRIEVAL 10MM (ENDOMECHANICALS) IMPLANT
PROTECTOR NERVE ULNAR (MISCELLANEOUS) ×3 IMPLANT
SCISSORS LAP 5X35 DISP (ENDOMECHANICALS) IMPLANT
SET CYSTO W/LG BORE CLAMP LF (SET/KITS/TRAYS/PACK) IMPLANT
SET IRRIG TUBING LAPAROSCOPIC (IRRIGATION / IRRIGATOR) ×3 IMPLANT
SHEARS HARMONIC ACE PLUS 36CM (ENDOMECHANICALS) ×3 IMPLANT
SURGIFLO W/THROMBIN 8M KIT (HEMOSTASIS) ×3 IMPLANT
SUT VIC AB 0 CT1 27 (SUTURE) ×5
SUT VIC AB 0 CT1 27XBRD ANBCTR (SUTURE) ×10 IMPLANT
SUT VIC AB 0 CT1 36 (SUTURE) ×6 IMPLANT
SUT VIC AB 3-0 CT1 27 (SUTURE) ×1
SUT VIC AB 3-0 CT1 TAPERPNT 27 (SUTURE) ×2 IMPLANT
SUT VIC AB 3-0 X1 27 (SUTURE) ×3 IMPLANT
SUT VICRYL 0 UR6 27IN ABS (SUTURE) ×3 IMPLANT
SUT VICRYL 4-0 PS2 18IN ABS (SUTURE) ×3 IMPLANT
SYR 50ML LL SCALE MARK (SYRINGE) ×3 IMPLANT
SYRINGE 10CC LL (SYRINGE) ×6 IMPLANT
SYRINGE 60CC LL (MISCELLANEOUS) ×3 IMPLANT
TIP UTERINE 5.1X6CM LAV DISP (MISCELLANEOUS) IMPLANT
TIP UTERINE 6.7X10CM GRN DISP (MISCELLANEOUS) IMPLANT
TIP UTERINE 6.7X6CM WHT DISP (MISCELLANEOUS) ×3 IMPLANT
TIP UTERINE 6.7X8CM BLUE DISP (MISCELLANEOUS) ×3 IMPLANT
TOWEL OR 17X24 6PK STRL BLUE (TOWEL DISPOSABLE) ×6 IMPLANT
TRAY FOLEY CATH 14FR (SET/KITS/TRAYS/PACK) ×3 IMPLANT
TROCAR XCEL NON-BLD 11X100MML (ENDOMECHANICALS) ×3 IMPLANT
TROCAR XCEL NON-BLD 5MMX100MML (ENDOMECHANICALS) ×6 IMPLANT
WARMER LAPAROSCOPE (MISCELLANEOUS) ×3 IMPLANT
WATER STERILE IRR 1000ML POUR (IV SOLUTION) IMPLANT

## 2015-01-02 NOTE — Anesthesia Postprocedure Evaluation (Signed)
  Anesthesia Post-op Note  Patient: Renee Benjamin  Procedure(s) Performed: Procedure(s) (LRB): HYSTERECTOMY TOTAL LAPAROSCOPIC (N/A) LAPAROSCOPIC BILATERAL SALPINGECTOMY (Bilateral)  Patient Location: PACU  Anesthesia Type: General  Level of Consciousness: awake and alert   Airway and Oxygen Therapy: Patient Spontanous Breathing  Post-op Pain: mild  Post-op Assessment: Post-op Vital signs reviewed, Patient's Cardiovascular Status Stable, Respiratory Function Stable, Patent Airway and No signs of Nausea or vomiting  Last Vitals:  Filed Vitals:   01/02/15 1700  BP: 102/60  Pulse: 71  Temp:   Resp: 16    Post-op Vital Signs: stable   Complications: No apparent anesthesia complications

## 2015-01-02 NOTE — Transfer of Care (Signed)
Immediate Anesthesia Transfer of Care Note  Patient: Renee Benjamin  Procedure(s) Performed: Procedure(s): HYSTERECTOMY TOTAL LAPAROSCOPIC (N/A) LAPAROSCOPIC BILATERAL SALPINGECTOMY (Bilateral)  Patient Location: PACU  Anesthesia Type:General  Level of Consciousness: awake, alert  and oriented  Airway & Oxygen Therapy: Patient Spontanous Breathing and Patient connected to nasal cannula oxygen  Post-op Assessment: Report given to RN and Post -op Vital signs reviewed and stable  Post vital signs: Reviewed and stable  Last Vitals:  Filed Vitals:   01/02/15 1043  BP: 122/73  Pulse: 68  Temp: 36.7 C  Resp: 20    Complications: No apparent anesthesia complications

## 2015-01-02 NOTE — Anesthesia Procedure Notes (Signed)
Procedure Name: Intubation Date/Time: 01/02/2015 12:40 PM Performed by: [place] Ravens HRISTOVA Pre-anesthesia Checklist: Patient identified, Emergency Drugs available, Suction available and Patient being monitored Patient Re-evaluated:Patient Re-evaluated prior to inductionOxygen Delivery Method: Circle system utilized Preoxygenation: Pre-oxygenation with 100% oxygen Intubation Type: IV induction Ventilation: Mask ventilation without difficulty Laryngoscope Size: Mac and 3 Grade View: Grade I Tube type: Oral Number of attempts: 1 Airway Equipment and Method: Stylet Placement Confirmation: ETT inserted through vocal cords under direct vision,  positive ETCO2 and breath sounds checked- equal and bilateral Dental Injury: Teeth and Oropharynx as per pre-operative assessment

## 2015-01-02 NOTE — Op Note (Addendum)
Fritzi Mandes PROCEDURE DATE: 01/02/2015  PREOPERATIVE DIAGNOSES: Chronic pelvic pain and menorrhagia POSTOPERATIVE DIAGNOSES: The same PROCEDURE: Total laparoscopic hysterectomy, bilateral salpingectomy SURGEON:  Dr. Verita Schneiders ASSISTANT: Dr. Lavonia Drafts   INDICATIONS: 34 y.o. G1P1 here for definitive surgical management secondary to the indications listed under preoperative diagnoses; please see preoperative note for further details.  Risks of surgery were discussed with the patient including but not limited to: bleeding which may require transfusion or reoperation; infection which may require antibiotics; injury to bowel, bladder, ureters or other surrounding organs; need for additional procedures; thromboembolic phenomenon, incisional problems and other postoperative/anesthesia complications. Written informed consent was obtained.    FINDINGS:  Small uterus. Dense adhesion involving entire right half anterior part of the uterus and the bladder and anterior abdominal wall.  Normal adnexa. No evidence of endometriosis seen.  ANESTHESIA:    General INTRAVENOUS FLUIDS:1800  ml ESTIMATED BLOOD LOSS: 400 ml URINE OUTPUT: 200 ml SPECIMENS: Uterus, cervix, bilateral fallopian tubes COMPLICATIONS: None immediate  PROCEDURE IN DETAIL:  The patient received intravenous antibiotics and had sequential compression devices applied to her lower extremities while in the preoperative area.  She was then taken to the operating room where general anesthesia was administered and was found to be adequate.  She was placed in the dorsal lithotomy position, and was prepped and draped in a sterile manner.  A RUMI uterine manipulator was placed at this time.  A Foley catheter was inserted into her bladder and attached to constant drainage. After an adequate timeout was performed, attention was turned to the abdomen where an umbilical incision was made with the scalpel.  The Optiview 11-mm trocar and  sleeve were then advanced without difficulty with the laparoscope under direct visualization into the abdomen.  The abdomen was then insufflated with carbon dioxide gas and adequate pneumoperitoneum was obtained.  A survey of the patient's pelvis and abdomen revealed the findings above.  Bilateral 5-mm lower quadrant ports were then placed under direct visualization.  The pelvis was then carefully examined.  Attention was turned to the fallopian tubes; these were freed from the underlying mesosalpinx and the uterine attachments using the Harmonic device.  The large adhesion involving anterior abdominal wall, bladder and right part of the uterus was painstakingly resected with care given to avoid injury to the bladder.   The bilateral round and broad ligaments were then clamped and transected with the Harmonic device.  The uterine artery was then skeletonized and a bladder flap was created.  The ureters were noted to be safely away from the area of dissection.  The bladder was then sharply dissected off the lower uterine segment.  At this point, attention was turned to the uterine vessels, which were clamped and ligated using the Harmonic scalpel.  Good hemostasis was noted overall.  The uterosacral and cardinal ligaments were clamped, cut and ligated bilaterally .  Attention was then turned to the cervicovaginal junction, and the Harmonic device was used to transect the cervix from the surrounding vagina using the ring of the RUMI as a guide. This was done circumferentially allowing total hysterectomy.  The uterus was then removed from the vagina and the vaginal cuff incision was then closed from below using 0 Vicryl.  There was some bleeding from the vaginal cuff, and the figure of eight stitches were placed to help with hemostasis.  Overall excellent hemostasis was noted.  The abdominal pressure was reduced and hemostasis was confirmed.   All trocars were removed under direct  visualization, and the abdomen was  desufflated.  The fascial incision of the umbilicus and 85-OY site were closed with a 0 Vicryl figure of eight stitch.  All skin incisions were closed with 4-0 Vicryl subcuticular stitches and Liquibond. The patient tolerated the procedures well.  All instruments, needles, and sponge counts were correct x 2. The patient was taken to the recovery room awake, extubated and in stable condition.   Verita Schneiders, MD, [place] Attending [place], [facility]

## 2015-01-02 NOTE — H&P (Signed)
Preoperative History and Physical  Renee Benjamin is a 34 y.o. G1P1 here for surgical management of chronic pelvic pain and menorrhagia.   No significant preoperative concerns.   Proposed surgery: Total laparoscopic hysterectomy (TLH) and prophylactic bilateral salpingectomy. No indication for oophorectomy unless severe endometriosis is encountered during surgery; patient wants to avoid HRT as she thinks her "body does not react well to hormones".  Past Medical History  Diagnosis Date  . Allergy   . Asthma   . Endometriosis   . Polycystic ovarian disease   . Hypothyroidism   . Headache     otc med prn  . Anemia    Past Surgical History  Procedure Laterality Date  . Appendectomy  2008  . Shoulder surgery  2001    right  . Cesarean section  2014   OB History  Gravida Para Term Preterm AB SAB TAB Ectopic Multiple Living  1 1        1     # Outcome Date GA Lbr Len/2nd Weight Sex Delivery Anes PTL Lv  1 Para             Obstetric Comments  1st Menstrual Cycle:  12   1st Pregnancy:  31  Patient denies any other pertinent gynecologic issues.   No current facility-administered medications on file prior to encounter.   Current Outpatient Prescriptions on File Prior to Encounter  Medication Sig Dispense Refill  . levothyroxine (SYNTHROID, LEVOTHROID) 75 MCG tablet Take 75 mcg by mouth daily before breakfast.     . montelukast (SINGULAIR) 10 MG tablet Take 10 mg by mouth at bedtime.    Marilu Favre 150-35 MCG/24HR transdermal patch Place 1 patch onto the skin once a week. fridays  9   Allergies  Allergen Reactions  . Depakote [Divalproex Sodium] Hives  . Morphine And Related Hives  . Tramadol Nausea And Vomiting    Social History:   reports that she quit smoking about 2 years ago. Her smoking use included Cigarettes. She has a 1 pack-year smoking history. She has never used smokeless tobacco. She reports that she does not drink alcohol or use illicit drugs.  History reviewed.  No pertinent family history.  Review of Systems: Noncontributory  PHYSICAL EXAM: Blood pressure 122/73, pulse 68, temperature 98.1 F (36.7 C), temperature source Oral, resp. rate 20, SpO2 98 %. General appearance - alert, well appearing, and in no distress Chest - clear to auscultation, no wheezes, rales or rhonchi, symmetric air entry Heart - normal rate and regular rhythm Abdomen - soft, nontender, nondistended, no masses or organomegaly, well-healed surgical scar Pelvic - examination not indicated Extremities - peripheral pulses normal, no pedal edema, no clubbing or cyanosis  Labs: Results for orders placed or performed during the hospital encounter of 01/02/15 (from the past 336 hour(s))  Pregnancy, urine   Collection Time: 01/02/15 11:00 AM  Result Value Ref Range   Preg Test, Ur NEGATIVE NEGATIVE  Results for orders placed or performed during the hospital encounter of 12/27/14 (from the past 336 hour(s))  CBC   Collection Time: 12/27/14  8:15 AM  Result Value Ref Range   WBC 6.9 4.0 - 10.5 K/uL   RBC 4.05 3.87 - 5.11 MIL/uL   Hemoglobin 12.6 12.0 - 15.0 g/dL   HCT 36.7 36.0 - 46.0 %   MCV 90.6 78.0 - 100.0 fL   MCH 31.1 26.0 - 34.0 pg   MCHC 34.3 30.0 - 36.0 g/dL   RDW 12.6 11.5 - 15.5 %  Platelets 235 150 - 400 K/uL  Type and screen   Collection Time: 12/27/14  8:15 AM  Result Value Ref Range   ABO/RH(D) A POS    Antibody Screen NEG    Sample Expiration 01/10/2015   ABO/Rh   Collection Time: 12/27/14  8:15 AM  Result Value Ref Range   ABO/RH(D) A POS     Imaging Studies: No results found.  Assessment: Patient Active Problem List   Diagnosis Date Noted  . Menorrhagia with regular cycle 11/23/2014  . Chronic female pelvic pain 11/23/2014    Plan: Patient will undergo surgical management with Total laparoscopic hysterectomy (TLH) and prophylactic bilateral salpingectomy.   The risks of surgery were discussed in detail with the patient including but not  limited to: bleeding which may require transfusion or reoperation; infection which may require antibiotics; injury to surrounding organs which may involve bowel, bladder, ureters ; need for additional procedures including laparotomy; thromboembolic phenomenon, surgical site problems and other postoperative/anesthesia complications. Likelihood of success in alleviating the patient's condition was discussed. Routine postoperative instructions will be reviewed with the patient and her family in detail after surgery.  The patient concurred with the proposed plan, giving informed written consent for the surgery.  Patient has been NPO since last night she will remain NPO for procedure.  Anesthesia and OR aware.  Preoperative prophylactic antibiotics and SCDs ordered on call to the OR.  To OR when ready.  Verita Schneiders, M.D. 01/02/2015 12:21 PM

## 2015-01-02 NOTE — Anesthesia Preprocedure Evaluation (Signed)
Anesthesia Evaluation  Patient identified by MRN, date of birth, ID band Patient awake    Reviewed: Allergy & Precautions, H&P , NPO status , Patient's Chart, lab work & pertinent test results  Airway Mallampati: I  TM Distance: >3 FB Neck ROM: full    Dental no notable dental hx. (+) Teeth Intact   Pulmonary former smoker,  breath sounds clear to auscultation  Pulmonary exam normal       Cardiovascular negative cardio ROS      Neuro/Psych negative psych ROS   GI/Hepatic negative GI ROS, Neg liver ROS,   Endo/Other  Hypothyroidism   Renal/GU negative Renal ROS     Musculoskeletal   Abdominal Normal abdominal exam  (+)   Peds  Hematology negative hematology ROS (+)   Anesthesia Other Findings   Reproductive/Obstetrics negative OB ROS                             Anesthesia Physical Anesthesia Plan  ASA: II  Anesthesia Plan: General   Post-op Pain Management:    Induction: Intravenous  Airway Management Planned: Oral ETT  Additional Equipment:   Intra-op Plan:   Post-operative Plan: Extubation in OR  Informed Consent: I have reviewed the patients History and Physical, chart, labs and discussed the procedure including the risks, benefits and alternatives for the proposed anesthesia with the patient or authorized representative who has indicated his/her understanding and acceptance.   Dental Advisory Given  Plan Discussed with: CRNA and Surgeon  Anesthesia Plan Comments:         Anesthesia Quick Evaluation

## 2015-01-03 ENCOUNTER — Encounter (HOSPITAL_COMMUNITY): Payer: Self-pay | Admitting: Obstetrics & Gynecology

## 2015-01-03 DIAGNOSIS — Z90710 Acquired absence of both cervix and uterus: Secondary | ICD-10-CM | POA: Diagnosis present

## 2015-01-03 DIAGNOSIS — N920 Excessive and frequent menstruation with regular cycle: Secondary | ICD-10-CM | POA: Diagnosis not present

## 2015-01-03 LAB — CBC
HCT: 28.8 % — ABNORMAL LOW (ref 36.0–46.0)
Hemoglobin: 9.7 g/dL — ABNORMAL LOW (ref 12.0–15.0)
MCH: 30.6 pg (ref 26.0–34.0)
MCHC: 33.7 g/dL (ref 30.0–36.0)
MCV: 90.9 fL (ref 78.0–100.0)
Platelets: 236 10*3/uL (ref 150–400)
RBC: 3.17 MIL/uL — ABNORMAL LOW (ref 3.87–5.11)
RDW: 12.3 % (ref 11.5–15.5)
WBC: 12.3 10*3/uL — ABNORMAL HIGH (ref 4.0–10.5)

## 2015-01-03 MED ORDER — DOCUSATE SODIUM 100 MG PO CAPS: 100.0000 mg | ORAL_CAPSULE | Freq: Two times a day (BID) | ORAL | Status: DC | PRN

## 2015-01-03 MED ORDER — IBUPROFEN 600 MG PO TABS: 600.0000 mg | ORAL_TABLET | Freq: Four times a day (QID) | ORAL | Status: DC | PRN

## 2015-01-03 MED ORDER — HYDROMORPHONE HCL 2 MG PO TABS: 2.0000 mg | ORAL_TABLET | ORAL | Status: DC | PRN

## 2015-01-03 NOTE — Discharge Summary (Signed)
Gynecology Physician Postoperative Discharge Summary  Patient ID: [name] MRN: [id number] DOB/AGE: 04-09-1981 34 y.o.  Admit Date: 01/02/2015 Discharge Date: 01/03/2015  Preoperative Diagnoses: Chronic pelvic pain and menorrhagia  Procedures: TOTAL LAPAROSCOPIC HYSTERECTOMY AND BILATERAL SALPINGECTOMY  CBC Latest Ref Rng 01/03/2015 12/27/2014  WBC 4.0 - 10.5 K/uL 12.3(H) 6.9  Hemoglobin 12.0 - 15.0 g/dL 9.7(L) 12.6  Hematocrit 36.0 - 46.0 % 28.8(L) 36.7  Platelets 150 - 400 K/uL 236 235   Hospital Course:  [name] is a 34 y.o. G1P1 admitted for scheduled surgery.  She underwent the procedures as mentioned above, her operation was uncomplicated. For further details about surgery, please refer to the operative report. Patient had an uncomplicated postoperative course. By time of discharge on POD#1, her pain was controlled on oral pain medications; she was ambulating, voiding without difficulty, tolerating regular diet and passing flatus. She was deemed stable for discharge to home.   Discharge Exam: Blood pressure 100/49, pulse 71, temperature 98.3 F (36.8 C), temperature source Oral, resp. rate 20, height 5\' 8"  (1.727 m), weight 200 lb (90.719 kg), SpO2 96 %. General appearance: alert and no distress  Resp: clear to auscultation bilaterally  Cardio: regular rate and rhythm  GI: soft, non-tender; bowel sounds normal; no masses, no organomegaly.  Incisions: C/D/I, no erythema, no drainage noted Pelvic: scant blood on pad  Extremities: extremities normal, atraumatic, no cyanosis or edema and Homans sign is negative, no sign of DVT  Discharged Condition: Stable  Disposition: Final discharge disposition not confirmed     Medication List    TAKE these medications        albuterol 108 (90 BASE) MCG/ACT inhaler  Commonly known as:  PROVENTIL HFA;VENTOLIN HFA  Inhale 2 puffs into the lungs every 6 (six) hours as needed for wheezing or shortness of breath.     docusate sodium 100 MG capsule  Commonly known as:  COLACE  Take 1 capsule (100 mg total) by mouth 2 (two) times daily as needed.     fexofenadine 180 MG tablet  Commonly known as:  ALLEGRA  Take 180 mg by mouth daily.     HYDROmorphone 2 MG tablet  Commonly known as:  DILAUDID  Take 1-2 tablets (2-4 mg total) by mouth every 4 (four) hours as needed for moderate pain or severe pain.     ibuprofen 600 MG tablet  Commonly known as:  ADVIL,MOTRIN  Take 1 tablet (600 mg total) by mouth every 6 (six) hours as needed (mild pain).     levothyroxine 75 MCG tablet  Commonly known as:  SYNTHROID, LEVOTHROID  Take 75 mcg by mouth daily before breakfast.     montelukast 10 MG tablet  Commonly known as:  SINGULAIR  Take 10 mg by mouth at bedtime.       Follow-up Information    Follow up with [facility], MD. Schedule an appointment as soon as possible for a visit in 2 weeks.   Specialty:  Obstetrics and Gynecology   Why:  for postoperative appointment. Call clinic/come to MAU for any concerning issues   Contact information:   [address] [place] [address] [phone]       Signed:  Verita Schneiders, MD, [place] Attending [place], [facility]

## 2015-01-03 NOTE — Anesthesia Postprocedure Evaluation (Signed)
Anesthesia Post Note  Patient: Renee Benjamin  Procedure(s) Performed: Procedure(s) (LRB): HYSTERECTOMY TOTAL LAPAROSCOPIC (N/A) LAPAROSCOPIC BILATERAL SALPINGECTOMY (Bilateral)  Anesthesia type: General  Patient location: Mother/Baby  Post pain: Pain level controlled  Post assessment: Post-op Vital signs reviewed  Last Vitals:  Filed Vitals:   01/03/15 0552  BP: 100/49  Pulse: 71  Temp: 36.8 C  Resp: 20    Post vital signs: Reviewed  Level of consciousness: awake and alert   Complications: No apparent anesthesia complications

## 2015-01-03 NOTE — Addendum Note (Signed)
Addendum  created 01/03/15 8022 by Talbot Grumbling, CRNA   Modules edited: Notes Section   Notes Section:  File: [id number]

## 2015-01-03 NOTE — Discharge Instructions (Signed)
General Gynecological Post-Operative Instructions You may expect to feel dizzy, weak, and drowsy for as long as 24 hours after receiving the medicine that made you sleep (anesthetic).  Do not drive a car, ride a bicycle, participate in physical activities, or take public transportation until you are done taking narcotic pain medicines or as directed by your doctor.  Do not drink alcohol or take tranquilizers.  Do not take medicine that has not been prescribed by your doctor.  Do not sign important papers or make important decisions while on narcotic pain medicines.  Have a responsible person with you.  CARE OF INCISION  Keep incision clean and dry. Take showers instead of baths until your doctor gives you permission to take baths.  Avoid heavy lifting (more than 20 pounds), pushing, or pulling.  Avoid activities that may risk injury to your surgical site.  No sexual intercourse or placement of anything in the vagina for 6 weeks or as instructed by your doctor. Only take prescription or over-the-counter medicines  for pain, discomfort, or fever as directed by your doctor. Do not take aspirin. It can make you bleed. Take medicines (antibiotics) that kill germs if they are prescribed for you.  Call the office or go to the MAU if:  You feel sick to your stomach (nauseous).  You start to throw up (vomit).  You have trouble eating or drinking.  You have an oral temperature above 101.  You have constipation that is not helped by adjusting diet or increasing fluid intake. Pain medicines are a common cause of constipation.  You have any other concerns. SEEK IMMEDIATE MEDICAL CARE IF:  You have persistent dizziness.  You have difficulty breathing or a congested sounding (croupy) cough.  You have an oral temperature above 102.5, not controlled by medicine.  There is increasing pain or tenderness near or in the surgical site.   Total Laparoscopic Hysterectomy, Care After Refer to this sheet in the  next few weeks. These instructions provide you with information on caring for yourself after your procedure. Your health care provider may also give you more specific instructions. Your treatment has been planned according to current medical practices, but problems sometimes occur. Call your health care provider if you have any problems or questions after your procedure. WHAT TO EXPECT AFTER THE PROCEDURE Pain and bruising at the incision sites. You will be given pain medicine to control it. Menopausal symptoms such as hot flashes, night sweats, and insomnia if your ovaries were removed. Sore throat from the breathing tube that was inserted during surgery. HOME CARE INSTRUCTIONS Only take over-the-counter or prescription medicines for pain, discomfort, or fever as directed by your health care provider.  Do not take aspirin. It can cause bleeding.  Do not drive when taking pain medicine.  Follow your health care provider's advice regarding diet, exercise, lifting, driving, and general activities.  Resume your usual diet as directed and allowed.  Get plenty of rest and sleep.  Do not douche, use tampons, or have sexual intercourse for at least 6 weeks, or until your health care provider gives you permission.  Change your bandages (dressings) as directed by your health care provider.  Monitor your temperature and notify your health care provider of a fever.  Take showers instead of baths for 2-3 weeks.  Do not drink alcohol until your health care provider gives you permission.  If you develop constipation, you may take a mild laxative with your health care provider's permission. Bran foods may help  with constipation problems. Drinking enough fluids to keep your urine clear or pale yellow may help as well.  Try to have someone home with you for 1-2 weeks to help around the house.  Keep all of your follow-up appointments as directed by your health care provider.  SEEK MEDICAL CARE  IF: You have swelling, redness, or increasing pain around your incision sites.  You have pus coming from your incision.  You notice a bad smell coming from your incision.  Your incision breaks open.  You feel dizzy or lightheaded.  You have pain or bleeding when you urinate.  You have persistent diarrhea.  You have persistent nausea and vomiting.  You have abnormal vaginal discharge.  You have a rash.  You have any type of abnormal reaction or develop an allergy to your medicine.  You have poor pain control with your prescribed medicine.  SEEK IMMEDIATE MEDICAL CARE IF: You have chest pain or shortness of breath. You have severe abdominal pain that is not relieved with pain medicine. You have pain or swelling in your legs. MAKE SURE YOU: Understand these instructions. Will watch your condition. Will get help right away if you are not doing well or get worse. Document Released: 08/10/2013 Document Revised: 10/25/2013 Document Reviewed: 08/10/2013 [facility] Patient Information 2015 [place], [place]. This information is not intended to replace advice given to you by your health care provider. Make sure you discuss any questions you have with your health care provider.

## 2015-01-03 NOTE — Progress Notes (Signed)
Pt is discharged in the care of Mother. Downstairs per ambulatory with R.N. Judd Lien. Denies pain or discomfort. Abdominal incisions are clean and dry. Understands all discharged instructions Questions were asked and answered. Stable.  No equipment needed for home use.

## 2015-01-09 ENCOUNTER — Encounter (HOSPITAL_COMMUNITY): Payer: Self-pay

## 2015-01-09 ENCOUNTER — Other Ambulatory Visit: Payer: Self-pay | Admitting: Family Medicine

## 2015-01-09 ENCOUNTER — Encounter: Payer: Self-pay | Admitting: Family Medicine

## 2015-01-09 ENCOUNTER — Ambulatory Visit (INDEPENDENT_AMBULATORY_CARE_PROVIDER_SITE_OTHER): Payer: BLUE CROSS/BLUE SHIELD | Admitting: Family Medicine

## 2015-01-09 ENCOUNTER — Ambulatory Visit (HOSPITAL_COMMUNITY)
Admission: RE | Admit: 2015-01-09 | Discharge: 2015-01-09 | Disposition: A | Discharge status: Home / Self Care | Payer: BLUE CROSS/BLUE SHIELD | Source: Ambulatory Visit | Attending: Family Medicine | Admitting: Family Medicine

## 2015-01-09 DIAGNOSIS — R1032 Left lower quadrant pain: Secondary | ICD-10-CM | POA: Diagnosis present

## 2015-01-09 DIAGNOSIS — Z90710 Acquired absence of both cervix and uterus: Secondary | ICD-10-CM

## 2015-01-09 DIAGNOSIS — N939 Abnormal uterine and vaginal bleeding, unspecified: Secondary | ICD-10-CM | POA: Insufficient documentation

## 2015-01-09 DIAGNOSIS — Z09 Encounter for follow-up examination after completed treatment for conditions other than malignant neoplasm: Secondary | ICD-10-CM

## 2015-01-09 MED ORDER — IOHEXOL 300 MG/ML  SOLN: 100.0000 mL | Freq: Once | INTRAMUSCULAR | Status: AC | PRN

## 2015-01-09 NOTE — Assessment & Plan Note (Signed)
Pt. Had drop in Hgb post-operatively, could be hematoma at cuff.  Will image her to see if there was an infection.

## 2015-01-09 NOTE — Progress Notes (Signed)
    Subjective:    Patient ID: Renee Benjamin is a 34 y.o. female presenting with Post-op Problem  on 01/09/2015  HPI: S/p TLH and bilateral salpingectomy1 week ago. Home the second day. Usual state of health until did not feel well yesterday. Notes she woke in a puddle of blood today. Previously no bleeding. Denies fever, chills. Has felt dizzy. No nausea or emesis.  Review of Systems  Constitutional: Negative for fever and chills.  Respiratory: Negative for shortness of breath.   Cardiovascular: Negative for chest pain.  Gastrointestinal: Positive for abdominal pain. Negative for nausea and vomiting.  Genitourinary: Positive for vaginal bleeding. Negative for dysuria.  Skin: Negative for rash.      Objective:    BP 116/78 mmHg  Pulse 100  Temp(Src) 99.4 F (37.4 C) (Oral)  Ht 5\' 8"  (1.727 m)  Wt 201 lb 12.8 oz (91.536 kg)  BMI 30.69 kg/m2  LMP 10/30/2014 Physical Exam  Constitutional: She appears well-developed and well-nourished.  pale  HENT:  Head: Normocephalic and atraumatic.  Pulmonary/Chest: Effort normal.  Abdominal: Soft.  Genitourinary:  She is exquisitely tender and there is fullness at the cuff        Assessment & Plan:   Problem List Items Addressed This Visit      Unprioritized   S/P laparoscopic hysterectomy    Pt. Had drop in Hgb post-operatively, could be hematoma at cuff.  Will image her to see if there was an infection.      Relevant Orders   CT Abdomen Pelvis W Wo Contrast

## 2015-01-10 ENCOUNTER — Encounter: Payer: Self-pay | Admitting: Obstetrics & Gynecology

## 2015-01-10 ENCOUNTER — Ambulatory Visit (INDEPENDENT_AMBULATORY_CARE_PROVIDER_SITE_OTHER): Payer: BLUE CROSS/BLUE SHIELD | Admitting: Obstetrics & Gynecology

## 2015-01-10 VITALS — BP 93/69 | HR 92 | Wt 200.0 lb

## 2015-01-10 DIAGNOSIS — N99820 Postprocedural hemorrhage and hematoma of a genitourinary system organ or structure following a genitourinary system procedure: Secondary | ICD-10-CM

## 2015-01-10 LAB — CBC
HCT: 31.9 % — ABNORMAL LOW (ref 36.0–46.0)
Hemoglobin: 10.5 g/dL — ABNORMAL LOW (ref 12.0–15.0)
MCH: 30.3 pg (ref 26.0–34.0)
MCHC: 33.1 g/dL (ref 30.0–36.0)
MCV: 92 fL (ref 78.0–100.0)
Platelets: 434 10*3/uL — ABNORMAL HIGH (ref 150–400)
RBC: 3.46 MIL/uL — ABNORMAL LOW (ref 3.87–5.11)
RDW: 13.1 % (ref 11.5–15.5)
WBC: 10.3 10*3/uL (ref 4.0–10.5)

## 2015-01-10 MED ORDER — LEVOFLOXACIN 500 MG PO TABS: 500.0000 mg | ORAL_TABLET | Freq: Every day | ORAL | Status: DC

## 2015-01-10 MED ORDER — FLUCONAZOLE 150 MG PO TABS: 150.0000 mg | ORAL_TABLET | Freq: Once | ORAL | Status: DC

## 2015-01-10 MED ORDER — METRONIDAZOLE 500 MG PO TABS: 500.0000 mg | ORAL_TABLET | Freq: Two times a day (BID) | ORAL | Status: AC

## 2015-01-10 NOTE — Patient Instructions (Signed)
Total Laparoscopic Hysterectomy, Care After °Refer to this sheet in the next few weeks. These instructions provide you with information on caring for yourself after your procedure. Your health care provider may also give you more specific instructions. Your treatment has been planned according to current medical practices, but problems sometimes occur. Call your health care provider if you have any problems or questions after your procedure. °WHAT TO EXPECT AFTER THE PROCEDURE °· Pain and bruising at the incision sites. You will be given pain medicine to control it. °· Menopausal symptoms such as hot flashes, night sweats, and insomnia if your ovaries were removed. °· Sore throat from the breathing tube that was inserted during surgery. °HOME CARE INSTRUCTIONS °· Only take over-the-counter or prescription medicines for pain, discomfort, or fever as directed by your health care provider.   °· Do not take aspirin. It can cause bleeding.   °· Do not drive when taking pain medicine.   °· Follow your health care provider's advice regarding diet, exercise, lifting, driving, and general activities.   °· Resume your usual diet as directed and allowed.   °· Get plenty of rest and sleep.   °· Do not douche, use tampons, or have sexual intercourse for at least 6 weeks, or until your health care provider gives you permission.   °· Change your bandages (dressings) as directed by your health care provider.   °· Monitor your temperature and notify your health care provider of a fever.   °· Take showers instead of baths for 2-3 weeks.   °· Do not drink alcohol until your health care provider gives you permission.   °· If you develop constipation, you may take a mild laxative with your health care provider's permission. Bran foods may help with constipation problems. Drinking enough fluids to keep your urine clear or pale yellow may help as well.   °· Try to have someone home with you for 1-2 weeks to help around the house.    °· Keep all of your follow-up appointments as directed by your health care provider.   °SEEK MEDICAL CARE IF: °· You have swelling, redness, or increasing pain around your incision sites.   °· You have pus coming from your incision.   °· You notice a bad smell coming from your incision.   °· Your incision breaks open.   °· You feel dizzy or lightheaded.   °· You have pain or bleeding when you urinate.   °· You have persistent diarrhea.   °· You have persistent nausea and vomiting.   °· You have abnormal vaginal discharge.   °· You have a rash.   °· You have any type of abnormal reaction or develop an allergy to your medicine.   °· You have poor pain control with your prescribed medicine.   °SEEK IMMEDIATE MEDICAL CARE IF: °· You have chest pain or shortness of breath. °· You have severe abdominal pain that is not relieved with pain medicine. °· You have pain or swelling in your legs. °MAKE SURE YOU: °· Understand these instructions. °· Will watch your condition. °· Will get help right away if you are not doing well or get worse. °Document Released: 08/10/2013 Document Revised: 10/25/2013 Document Reviewed: 08/10/2013 °[employer]® Patient Information ©2015 [employer]. This information is not intended to replace advice given to you by your health care provider. Make sure you discuss any questions you have with your health care provider. ° °

## 2015-01-10 NOTE — Progress Notes (Signed)
Subjective:     Patient ID: Renee Benjamin, female   DOB: Aug 06, 1981, 34 y.o.   MRN: [id number]  HPI Pt reports that she feels like her pelvis is 'bruised'.  She reports that overall the pain is less han she imagined.  She reports that the bleeding started 3 days ago and became heavy yesterday.  She reports lighter bleeding today.  She reports that she could fill a pad in '5 hours'  She reports that she expected spotting but, not bleeding.  She reports that she doe not feel as weak tody as yesterday.  Yesterday she slept all day.  She reports that Tues she picked up son as he was falling off couch. He weighs 30#'s.     Pt also reports that she had back pain prior to the surgery that is completely gone now.  She is taking dilaudid and motrin for the pain.  She reports that the dilaudid make her feel loopy but the motrin seems to help with the pain during the day.  She is eating small meals with no nausea and has had no emesis.        Review of Systems     Objective:   Physical Exam BP 93/69 mmHg  Pulse 92  Wt 200 lb (90.719 kg)  LMP 10/30/2014 Pt appears slightly uncomfortable especially during exam.  Very pleasant.  Conversing and laughing. NAD Lungs: CTA CV: RRR Abd: port sites bruised.  Soft, ND. Appropriately tender GU: EGBUS: no lesions Vagina: small amount of blood at cuff; sutures noted and intact.  malodorous Adnexa: tender. Slight fullness noted  CBC    Component Value Date/Time   WBC 12.3* 01/03/2015 0535   RBC 3.17* 01/03/2015 0535   HGB 9.7* 01/03/2015 0535   HCT 28.8* 01/03/2015 0535   PLT 236 01/03/2015 0535   MCV 90.9 01/03/2015 0535   MCH 30.6 01/03/2015 0535   MCHC 33.7 01/03/2015 0535   RDW 12.3 01/03/2015 0535        Assessment:     Post op bleeding from vaginal cuff.  Possible cuff cell cellulitis     Plan:     CBC today  Levaquin 500mg  daily for 7 days  Flagyl 500mg  po bid x 7 days Diflucan 150mg  x 1 if yeast infxn comes F/u in 1 week with Dr.  Harolyn Rutherford

## 2015-01-11 ENCOUNTER — Encounter (INDEPENDENT_AMBULATORY_CARE_PROVIDER_SITE_OTHER): Payer: Self-pay | Admitting: *Deleted

## 2015-01-11 DIAGNOSIS — N99820 Postprocedural hemorrhage and hematoma of a genitourinary system organ or structure following a genitourinary system procedure: Secondary | ICD-10-CM

## 2015-01-11 NOTE — Progress Notes (Signed)
Patients disability forms are filled out and faxed to [facility] for patient.

## 2015-01-15 ENCOUNTER — Encounter: Payer: Self-pay | Admitting: Obstetrics & Gynecology

## 2015-01-15 ENCOUNTER — Ambulatory Visit (INDEPENDENT_AMBULATORY_CARE_PROVIDER_SITE_OTHER): Payer: BLUE CROSS/BLUE SHIELD | Admitting: Obstetrics & Gynecology

## 2015-01-15 VITALS — BP 124/93 | HR 96 | Wt 198.0 lb

## 2015-01-15 DIAGNOSIS — Z90710 Acquired absence of both cervix and uterus: Secondary | ICD-10-CM

## 2015-01-15 MED ORDER — OXYCODONE-ACETAMINOPHEN 5-325 MG PO TABS: 1.0000 | ORAL_TABLET | Freq: Four times a day (QID) | ORAL | Status: DC | PRN

## 2015-01-15 NOTE — Patient Instructions (Signed)
Return to clinic for any obstetric concerns or go to MAU for evaluation  

## 2015-01-15 NOTE — Progress Notes (Signed)
   CLINIC ENCOUNTER NOTE  History:  34 y.o. G1P1 here today for follow up for post-TLH bleeding.  Reports bleeding has subsided, has scant amount of bleeding currently. Desires more pain medication.  The following portions of the patient's history were reviewed and updated as appropriate: allergies, current medications, past family history, past medical history, past social history, past surgical history and problem list.  Review of Systems:  Pertinent items are noted in HPI.  Objective:  Physical Exam BP 124/93 mmHg  Pulse 96  Wt 198 lb (89.812 kg)  LMP 10/30/2014 Gen: NAD Abd: Soft, nontender and nondistended.Healing laparoscopic sites. Pelvic: Normal appearing external genitalia; normal appearing vaginal mucosa.  Sutures intact at vaginal cuff, scant bloody fluid in vault.  Labs and Imaging CBC Latest Ref Rng 01/10/2015 01/03/2015 12/27/2014  WBC 4.0 - 10.5 K/uL 10.3 12.3(H) 6.9  Hemoglobin 12.0 - 15.0 g/dL 10.5(L) 9.7(L) 12.6  Hematocrit 36.0 - 46.0 % 31.9(L) 28.8(L) 36.7  Platelets 150 - 400 K/uL 434(H) 236 235   Ct Abdomen Pelvis Wo Contrast  01/09/2015   CLINICAL DATA:  Laparoscopic assisted hysterectomy 01/02/2015. Left lower quadrant pain and vaginal bleeding since last night.  EXAM: CT ABDOMEN AND PELVIS WITHOUT CONTRAST  TECHNIQUE: Multidetector CT imaging of the abdomen and pelvis was performed following the standard protocol without IV contrast.  COMPARISON:  Ultrasound 11/29/2014.  FINDINGS: The study was performed without IV contrast due to the inability to obtain IV access despite multiple attempts.  Lung bases are clear.  No effusions.  Heart is normal size.  Liver, spleen, gallbladder, pancreas, adrenals and kidneys are unremarkable. Stomach, large and small bowel are unremarkable.  Moderate complex free fluid noted within the pelvis, measuring from 17 Hounsfield units of 34 Hounsfield units. This presumably represents blood. No free air or adenopathy. Aorta is normal  caliber. Urinary bladder is unremarkable. Prior hysterectomy. No adnexal masses.  No acute bony abnormality.  IMPRESSION: Moderate complex free fluid in the pelvis, presumably blood. Changes of hysterectomy.   Electronically Signed   By: Rolm Baptise M.D.   On: 01/09/2015 16:05    Assessment & Plan:  Resolving post-hysterestomy bleeding Percocet and Ibuprofen prescribed for pain Follow up appointment scheduled 01/25/15   Verita Schneiders, MD, FACOG Attending [place] for [place], [place]

## 2015-01-25 ENCOUNTER — Encounter: Payer: Self-pay | Admitting: *Deleted

## 2015-01-25 ENCOUNTER — Encounter: Payer: Self-pay | Admitting: Obstetrics & Gynecology

## 2015-01-25 ENCOUNTER — Ambulatory Visit (INDEPENDENT_AMBULATORY_CARE_PROVIDER_SITE_OTHER): Payer: BLUE CROSS/BLUE SHIELD | Admitting: Obstetrics & Gynecology

## 2015-01-25 VITALS — BP 109/79 | HR 86 | Ht 68.0 in | Wt 197.0 lb

## 2015-01-25 DIAGNOSIS — Z90710 Acquired absence of both cervix and uterus: Secondary | ICD-10-CM

## 2015-01-25 DIAGNOSIS — Z09 Encounter for follow-up examination after completed treatment for conditions other than malignant neoplasm: Secondary | ICD-10-CM

## 2015-01-25 NOTE — Progress Notes (Signed)
   CLINIC ENCOUNTER NOTE  History:  34 y.o. G1P1 here today for postoperative check. She has been seen a few times since her [place] on 01/02/15 secondary to bleeding, last time was on 01/15/15.  She reports having only brown discharge now, resolving pain.  The following portions of the patient's history were reviewed and updated as appropriate: allergies, current medications, past family history, past medical history, past social history, past surgical history and problem list.   Review of Systems:  A comprehensive review of systems was negative.  Objective:  Physical Exam BP 109/79 mmHg  Pulse 86  Ht 5\' 8"  (1.727 m)  Wt 197 lb (89.359 kg)  BMI 29.96 kg/m2  LMP 10/30/2014 Gen: NAD Abd: Soft, nontender and nondistended.Healing laparoscopic sites. Pelvic: Normal appearing external genitalia; normal appearing vaginal mucosa. Sutures intact at vaginal cuff, scant brown fluid in vault.  Assessment & Plan:  Normal postoperative check Pelvic rest until 8 weeks after surgery emphasized Routine preventative health maintenance measures emphasized.   Verita Schneiders, MD, [place] Attending [place] for [employer], [place]

## 2015-01-25 NOTE — Patient Instructions (Signed)
Return to clinic for any scheduled appointments or for any gynecologic concerns as needed.   

## 2015-01-25 NOTE — Progress Notes (Signed)
Has pain, whenever she has to hold her poop or pee for even a short period of time.  Numbness is going away at surgical site.

## 2015-02-14 ENCOUNTER — Encounter: Payer: Self-pay | Admitting: Obstetrics & Gynecology

## 2015-02-14 ENCOUNTER — Ambulatory Visit (INDEPENDENT_AMBULATORY_CARE_PROVIDER_SITE_OTHER): Payer: BLUE CROSS/BLUE SHIELD | Admitting: Obstetrics & Gynecology

## 2015-02-14 VITALS — BP 124/82 | HR 63

## 2015-02-14 DIAGNOSIS — Z90710 Acquired absence of both cervix and uterus: Secondary | ICD-10-CM

## 2015-02-14 NOTE — Progress Notes (Signed)
    CLINIC ENCOUNTER NOTE  History:  34 y.o. G1P1 here today for postoperative check prior to returning to work.  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 She has been seen a few times since her [place] on 01/02/15 secondary to bleeding, last time was on 01/25/15. She reports not having any further discharge, bleeding or significant pain.  The following portions of the patient's history were reviewed and updated as appropriate: allergies, current medications, past family history, past medical history, past social history, past surgical history and problem list.   Review of Systems:  A comprehensive review of systems was negative.  Objective:  Physical Exam BP 124/82 mmHg  Pulse 63  LMP 10/30/2014 Gen: NAD Abd: Soft, nontender and nondistended. Healed laparoscopic sites. Pelvic: Deferred  Assessment & Plan:  Normal postoperative check Pelvic rest until 8 weeks after surgery emphasized Back to work letter given to patient to resume work on 01/15/15 Routine preventative health maintenance measures emphasized.   Verita Schneiders, MD, [place] Attending [place] for [employer], [place]

## 2015-02-14 NOTE — Patient Instructions (Signed)
Return to clinic for any scheduled appointments or for any gynecologic concerns as needed.   

## 2015-03-13 NOTE — H&P (Signed)
L&D Evaluation:  History Expanded:   HPI 34 yo at 101 week preg who is seen at [facility] in conjhunction with the HD who checked her BP at [facility] and it was "elevated" she also is having a headache. she has a history of allergies, has hypothyroidism, MJ exposure, she is bipolar and has anxiety, pos 1st rimester down screen, cell free DNA was negative.she alos has marginal cord insertion    Gravida 2    Term 0    PreTerm 0    Abortion 1    Living 0    Blood Type (Maternal) A positive    Group B Strep Results Maternal (Result >5wks must be treated as unknown) unknown/result > 5 weeks ago    Maternal HIV Negative    Maternal Syphilis Ab Nonreactive    Maternal Varicella Immune    Rubella Results (Maternal) unknown    Maternal T-Dap Immune    [facility] 12-Jan-2013    Presents with headache and "elevated BP" at [place] History Thyroid Disease  bipolar    Medications Pre Natal Vitamins    Allergies morphine causes a rash    Social History drugs    Family History Non-Contributory    Current Prenatal Course Notable For marginal cord insertion,   ROS:   General normal    HEENT headache    CNS normal    GI normal    GU normal    Resp normal    CV normal    Renal normal    MS normal   Exam:   Vital Signs stable    Urine Protein negative dipstick    General no apparent distress    Mental Status clear    Chest clear    Heart normal sinus rhythm    Abdomen gravid, non-tender    Estimated Fetal Weight Average for gestational age    Back no CVAT    Edema no edema    Clonus negative    Pelvic cervix closed and thick    Mebranes Intact    FHT normal rate with no decels    Fetal Heart Rate 140    Ucx irritability no contractions    Skin dry    Lymph no lymphadenopathy   Impression:   Impression headcahe- sinuses, watch BP and   Plan:   Plan EFM/NST, monitor contractions and for cervical change, monitor BP    Comments  fioricet for at bedtima dn ivf fluid bolus for irritabiolity    Follow Up Appointment already scheduled   Electronic Signatures: Erik Obey (MD)  (Signed 27-Jan-14 19:06)  Authored: L&D Evaluation   Last Updated: 27-Jan-14 19:06 by Erik Obey (MD)

## 2015-03-21 ENCOUNTER — Other Ambulatory Visit: Payer: Self-pay | Admitting: Orthopedic Surgery

## 2015-03-21 DIAGNOSIS — G8929 Other chronic pain: Secondary | ICD-10-CM

## 2015-03-21 DIAGNOSIS — M5441 Lumbago with sciatica, right side: Secondary | ICD-10-CM

## 2015-03-21 DIAGNOSIS — M545 Low back pain, unspecified: Secondary | ICD-10-CM

## 2015-03-21 DIAGNOSIS — M5442 Lumbago with sciatica, left side: Secondary | ICD-10-CM

## 2015-03-23 ENCOUNTER — Ambulatory Visit
Admission: RE | Admit: 2015-03-23 | Discharge: 2015-03-23 | Disposition: A | Discharge status: Home / Self Care | Payer: BLUE CROSS/BLUE SHIELD | Source: Ambulatory Visit | Attending: Orthopedic Surgery | Admitting: Orthopedic Surgery

## 2015-03-23 ENCOUNTER — Ambulatory Visit
Admission: RE | Admit: 2015-03-23 | Discharge: 2015-03-23 | Disposition: A | Discharge status: Home / Self Care | Payer: Self-pay | Source: Ambulatory Visit | Attending: Orthopedic Surgery | Admitting: Orthopedic Surgery

## 2015-03-23 DIAGNOSIS — M545 Low back pain, unspecified: Secondary | ICD-10-CM

## 2015-03-23 DIAGNOSIS — M5441 Lumbago with sciatica, right side: Secondary | ICD-10-CM | POA: Diagnosis present

## 2015-03-23 DIAGNOSIS — G8929 Other chronic pain: Secondary | ICD-10-CM | POA: Diagnosis present

## 2015-03-23 DIAGNOSIS — M5442 Lumbago with sciatica, left side: Secondary | ICD-10-CM | POA: Insufficient documentation

## 2015-04-10 ENCOUNTER — Encounter: Payer: Self-pay | Admitting: Family Medicine

## 2015-04-10 ENCOUNTER — Ambulatory Visit (INDEPENDENT_AMBULATORY_CARE_PROVIDER_SITE_OTHER): Payer: BLUE CROSS/BLUE SHIELD | Admitting: Family Medicine

## 2015-04-10 VITALS — BP 114/78 | HR 71 | Temp 98.4°F | Wt 206.0 lb

## 2015-04-10 DIAGNOSIS — G43109 Migraine with aura, not intractable, without status migrainosus: Secondary | ICD-10-CM | POA: Diagnosis not present

## 2015-04-10 DIAGNOSIS — F4310 Post-traumatic stress disorder, unspecified: Secondary | ICD-10-CM

## 2015-04-10 DIAGNOSIS — Z0000 Encounter for general adult medical examination without abnormal findings: Secondary | ICD-10-CM

## 2015-04-10 DIAGNOSIS — B029 Zoster without complications: Secondary | ICD-10-CM

## 2015-04-10 DIAGNOSIS — E039 Hypothyroidism, unspecified: Secondary | ICD-10-CM | POA: Insufficient documentation

## 2015-04-10 DIAGNOSIS — E038 Other specified hypothyroidism: Secondary | ICD-10-CM

## 2015-04-10 MED ORDER — TOPIRAMATE 25 MG PO TABS: ORAL_TABLET | ORAL | Status: DC

## 2015-04-10 MED ORDER — VALACYCLOVIR HCL 1 G PO TABS: 1000.0000 mg | ORAL_TABLET | Freq: Three times a day (TID) | ORAL | Status: DC

## 2015-04-10 MED ORDER — NAPROXEN 500 MG PO TABS: 500.0000 mg | ORAL_TABLET | Freq: Two times a day (BID) | ORAL | Status: DC

## 2015-04-10 NOTE — Progress Notes (Signed)
Patient ID: Renee Benjamin, female   DOB: December 12, 1980, 34 y.o.   MRN: [id number]   Subjective:   Renee Benjamin is a 34 y.o. female here for an acute visit She has had a rash on the chest and spread to the neck Started Sunday a week ago (9 days duratios) Getting worse Rash is painful, feels like the skin tingles, weird, never anything similar Can't be shingles she says because it's on both sides of the chest and neck and both arms Started after sun exposure[place] (feet are fine) She put sunscreen, aveeno, on the chest and neck and arms and face They look like whiteheads; she popped one but nothing drained She has had some sinus symptoms, plus bronchitis; normal for this time of year, never had a rash with those No new medicines Nothing different to eat, no new lotions, no new cleaning products; uses all free and clear products; no air fresheners  Having migraines back to back; sees aura prior to; saw neurologist and is now on Imitrex; she does not want to go back but would like to start something for prevention; she says cologne is a trigger; not sure of main trigger; she used to take Topamax for prevention; her BP stays low and her heart rate stays low  She mentioned episode from age 30 that caused serious stress, PTSD; she has had some treatment but still has difficulty with anxiety at times  She has hypothyroidism and would like her thyroid checked  Past Medical History  Diagnosis Date  . Allergy   . Asthma   . Endometriosis   . Polycystic ovarian disease   . Hypothyroidism   . Headache     otc med prn  . Anemia   . Vitamin D deficiency disease   . Thoracic back pain   . Anxiety     separation anxiety   Past Surgical History  Procedure Laterality Date  . Appendectomy  2008  . Shoulder surgery  2001    right  . Cesarean section  2014  . Laparoscopic hysterectomy N/A 01/02/2015    Procedure: HYSTERECTOMY TOTAL  LAPAROSCOPIC;  Surgeon: Osborne Oman, MD;  Location: [facility];  Service: Gynecology;  Laterality: N/A;  . Laparoscopic bilateral salpingectomy Bilateral 01/02/2015    Procedure: LAPAROSCOPIC BILATERAL SALPINGECTOMY;  Surgeon: Osborne Oman, MD;  Location: [facility];  Service: Gynecology;  Laterality: Bilateral;  . Abdominal hysterectomy  March 2016    Due to uterus being stiched into C Section incision   Family History  Problem Relation Age of Onset  . Diabetes Father   . Osteoporosis Mother   . Cancer Maternal Grandmother     breast  . Osteoporosis Maternal Grandmother    History  Substance Use Topics  . Smoking status: Former Smoker -- 0.25 packs/day for 4 years    Types: Cigarettes    Quit date: 04/17/2012  . Smokeless tobacco: Never Used  . Alcohol Use: No   Current Outpatient Prescriptions on File Prior to Visit  Medication Sig Dispense Refill  . albuterol (PROVENTIL HFA;VENTOLIN HFA) 108 (90 BASE) MCG/ACT inhaler Inhale 2 puffs into the lungs every 6 (six) hours as needed for wheezing or shortness of breath.    . fexofenadine (ALLEGRA) 180 MG tablet Take 180 mg by mouth daily.    Marland Kitchen levothyroxine (SYNTHROID, LEVOTHROID) 75 MCG tablet Take 75 mcg by mouth daily before breakfast.     .  mometasone (NASONEX) 50 MCG/ACT nasal spray Place 2 sprays into the nose daily.    . montelukast (SINGULAIR) 10 MG tablet Take 10 mg by mouth at bedtime.     No current facility-administered medications on file prior to visit.  also on Imitrex  Allergies  Allergen Reactions  . Depakote [Divalproex Sodium] Hives  . Morphine Hives  . Tramadol Nausea And Vomiting and Other (See Comments)  . Valproic Acid Rash   ------------- Review of Systems  Constitutional: Negative for fever and chills.  HENT:       No lip swelling or mouth swelling  Respiratory: Negative for shortness of breath.   Skin: Positive for itching and rash.       See HPI  Neurological:       Headaches, migraine with aura,  see HPI  Psychiatric/Behavioral: Negative for depression. The patient is nervous/anxious.    No results found for: CHOL, HDL, LDLCALC, LDLDIRECT, TRIG, CHOLHDL No results found for: HGBA1C No results found for: Derl Barrow Lab Results  Component Value Date   NA 140 03/16/2014   K 3.8 03/16/2014   CL 106 03/16/2014   CO2 29 03/16/2014   Lab Results  Component Value Date   CREATININE 0.63 03/16/2014   Lab Results  Component Value Date   WBC 10.3 01/10/2015   HGB 10.5* 01/10/2015   HCT 31.9* 01/10/2015   MCV 92.0 01/10/2015   PLT 434* 01/10/2015   Objective:   Filed Vitals:   04/10/15 0901  BP: 114/78  Pulse: 71  Temp: 98.4 F (36.9 C)  Weight: 206 lb (93.441 kg)  SpO2: 98%   Body mass index is 31.77 kg/(m^2). Wt Readings from Last 3 Encounters:  04/10/15 206 lb (93.441 kg)  03/02/15 202 lb (91.627 kg)  03/23/15 196 lb (88.905 kg)   Physical Exam  Constitutional: She appears well-developed and well-nourished.  HENT:  Head: Normocephalic and atraumatic.  Eyes: EOM are normal. No scleral icterus.  Neck: No thyromegaly present.  Cardiovascular: Normal rate.   Pulmonary/Chest: Effort normal. No respiratory distress.  Abdominal: She exhibits no distension.  Musculoskeletal: She exhibits no edema.  Neurological: No cranial nerve deficit (grossly).  Skin: Skin is warm. Rash (diffuse erythematous rash on the upper chest, lateral aspects of neck, right side more affected than left; papular; not vesicular; not pustular) noted.  Psychiatric: She has a normal mood and affect. Her behavior is normal. Judgment and thought content normal.  Briefly tearful when discussing past    Assessment/Plan:   Problem List Items Addressed This Visit      Cardiovascular and Mediastinum   Migraine headache with aura    Continue the imitrex PRN but let's add prophylactic med; avoid triggers; headache diary and return in 4 weeks; patient hand-out provided      Relevant  Medications   SUMAtriptan (IMITREX) 100 MG tablet   topiramate (TOPAMAX) 25 MG tablet   naproxen (NAPROSYN) 500 MG tablet     Endocrine   Hypothyroidism    Check TSH today, adjust medicine if needed      Relevant Orders   TSH     Other   PTSD (post-traumatic stress disorder)    Suggested EMDR; hand-out given; she may contact me if help needed finding a provider       Other Visit Diagnoses    Shingles rash    -  Primary    unusual that it would cross midline, so we'll get CBC and HIV; start valacyclovir, naproxen for pain  Relevant Medications    valACYclovir (VALTREX) 1000 MG tablet    naproxen (NAPROSYN) 500 MG tablet    Other Relevant Orders    CBC w/Diff    Routine general medical examination at a health care facility        code entered just for ordering screening labs; physical NOT done today    Relevant Orders    Lipid panel    HIV antibody (with reflex)    Comp Met (CMET)        Meds ordered this encounter  Medications  . SUMAtriptan (IMITREX) 100 MG tablet    Sig: Take 100 mg by mouth every 2 (two) hours as needed for migraine. May repeat in 2 hours if headache persists or recurs.  . valACYclovir (VALTREX) 1000 MG tablet    Sig: Take 1 tablet (1,000 mg total) by mouth 3 (three) times daily.    Dispense:  21 tablet    Refill:  0  . topiramate (TOPAMAX) 25 MG tablet    Sig: One pill PO daily x 1 week, then two pills daily x 1 week, then three pills daily x 1 week, then four pills daily    Dispense:  70 tablet    Refill:  0  . naproxen (NAPROSYN) 500 MG tablet    Sig: Take 1 tablet (500 mg total) by mouth 2 (two) times daily with a meal. Do NOT take any other NSAIDs    Dispense:  30 tablet    Refill:  0   Orders Placed This Encounter  Procedures  . CBC w/Diff  . TSH  . Lipid panel    Order Specific Question:  Has the patient fasted?    Answer:  Yes  . HIV antibody (with reflex)  . Comp Met (CMET)    Order Specific Question:  Has the patient fasted?     Answer:  Yes    Follow up plan: Return in about 4 weeks (around 05/08/2015) for headaches.   An After Visit Summary was printed and given to the patient.

## 2015-04-10 NOTE — Patient Instructions (Addendum)
Start the new medicine valacyclovir If symptoms not improving, then let's refer you to a dermatologist Use the naproxen if needed for pain Okay to also use tylenol if needed per package directions Avoid exposure to pregnant women and people on chemotherapy, etc. Start Topamax for headache prevention Keep a headache diary Return in 4 weeks If you are interested in EMDR, I can help find a provider for you You can go to Psychology Today's website and search for therapists by city You can sign on to [place] to get your results    Shingles Shingles is caused by the same virus that causes chickenpox. The first feelings may be pain or tingling. A rash will follow in a couple days. The rash may occur on any area of the body. Long-lasting pain is more likely in an elderly person. It can last months to years. There are medicines that can help prevent pain if you start taking them early. HOME CARE   Take cool baths or place cool cloths on the rash as told by your doctor.  Take medicine only as told by your doctor.  Rest as told by your doctor.  Keep your rash clean with mild soap and cool water or as told by your doctor.  Do not scratch your rash. You may use calamine lotion to relieve itchy skin as told by your doctor.  Keep your rash covered with a loose bandage (dressing).  Avoid touching:  Babies.  Pregnant women.  Children with inflamed skin (eczema).  People who have gotten organ transplants.  People with chronic illnesses, such as leukemia or AIDS.  Wear loose-fitting clothing.  If the rash is on the face, you may need to see a specialist. Keep all appointments. Shingles must be kept away from the eyes, if possible.  Keep all follow-up visits as told by your doctor. GET HELP RIGHT AWAY IF:   You have any pain on the face or eye.  You lose feeling on one side of your face.  You have ear pain or ringing in your ear.  You cannot taste as well.  Your medicines do not  help the pain.  Your redness or puffiness (swelling) spreads.  You feel like you are getting worse.  You have a fever. MAKE SURE YOU:   Understand these instructions.  Will watch your condition.  Will get help right away if you are not doing well or get worse. Document Released: 04/07/2008 Document Revised: 03/06/2014 Document Reviewed: 04/07/2008 [facility] Patient Information 2015 [place], [place]. This information is not intended to replace advice given to you by your health care provider. Make sure you discuss any questions you have with your health care provider. Recurrent Migraine Headache A migraine headache is an intense, throbbing pain on one or both sides of your head. Recurrent migraines keep coming back. A migraine can last for 30 minutes to several hours. CAUSES  The exact cause of a migraine headache is not always known. However, a migraine may be caused when nerves in the brain become irritated and release chemicals that cause inflammation. This causes pain. Certain things may also trigger migraines, such as:   Alcohol.  Smoking.  Stress.  Menstruation.  Aged cheeses.  Foods or drinks that contain nitrates, glutamate, aspartame, or tyramine.  Lack of sleep.  Chocolate.  Caffeine.  Hunger.  Physical exertion.  Fatigue.  Medicines used to treat chest pain (nitroglycerine), birth control pills, estrogen, and some blood pressure medicines. SYMPTOMS   Pain on one or both  sides of your head.  Pulsating or throbbing pain.  Severe pain that prevents daily activities.  Pain that is aggravated by any physical activity.  Nausea, vomiting, or both.  Dizziness.  Pain with exposure to bright lights, loud noises, or activity.  General sensitivity to bright lights, loud noises, or smells. Before you get a migraine, you may get warning signs that a migraine is coming (aura). An aura may include:  Seeing flashing lights.  Seeing bright spots, halos, or  zigzag lines.  Having tunnel vision or blurred vision.  Having feelings of numbness or tingling.  Having trouble talking.  Having muscle weakness. DIAGNOSIS  A recurrent migraine headache is often diagnosed based on:  Symptoms.  Physical examination.  A CT scan or MRI of your head. These imaging tests cannot diagnose migraines but can help rule out other causes of headaches.  TREATMENT  Medicines may be given for pain and nausea. Medicines can also be given to help prevent recurrent migraines. HOME CARE INSTRUCTIONS  Only take over-the-counter or prescription medicines for pain or discomfort as directed by your health care provider. The use of long-term narcotics is not recommended.  Lie down in a dark, quiet room when you have a migraine.  Keep a journal to find out what may trigger your migraine headaches. For example, write down:  What you eat and drink.  How much sleep you get.  Any change to your diet or medicines.  Limit alcohol consumption.  Quit smoking if you smoke.  Get 7-9 hours of sleep, or as recommended by your health care provider.  Limit stress.  Keep lights dim if bright lights bother you and make your migraines worse. SEEK MEDICAL CARE IF:   You do not get relief from the medicines given to you.  You have a recurrence of pain.  You have a fever. SEEK IMMEDIATE MEDICAL CARE IF:  Your migraine becomes severe.  You have a stiff neck.  You have loss of vision.  You have muscular weakness or loss of muscle control.  You start losing your balance or have trouble walking.  You feel faint or pass out.  You have severe symptoms that are different from your first symptoms. MAKE SURE YOU:   Understand these instructions.  Will watch your condition.  Will get help right away if you are not doing well or get worse. Document Released: 07/15/2001 Document Revised: 03/06/2014 Document Reviewed: 06/27/2013 [facility] Patient Information 2015  [place], [place]. This information is not intended to replace advice given to you by your health care provider. Make sure you discuss any questions you have with your health care provider.

## 2015-04-10 NOTE — Assessment & Plan Note (Addendum)
Check TSH today, adjust medicine if needed 

## 2015-04-10 NOTE — Assessment & Plan Note (Addendum)
Continue the imitrex PRN but let's add prophylactic med; avoid triggers; headache diary and return in 4 weeks; patient hand-out provided

## 2015-04-10 NOTE — Assessment & Plan Note (Signed)
Suggested EMDR; hand-out given; she may contact me if help needed finding a provider

## 2015-04-11 ENCOUNTER — Encounter: Payer: Self-pay | Admitting: Family Medicine

## 2015-04-11 LAB — LIPID PANEL
Chol/HDL Ratio: 3.8 ratio units (ref 0.0–4.4)
Cholesterol, Total: 199 mg/dL (ref 100–199)
HDL: 52 mg/dL (ref >39)
LDL Calculated: 127 mg/dL — ABNORMAL HIGH (ref 0–99)
Triglycerides: 101 mg/dL (ref 0–149)
VLDL Cholesterol Cal: 20 mg/dL (ref 5–40)

## 2015-04-11 LAB — COMPREHENSIVE METABOLIC PANEL
ALT: 17 IU/L (ref 0–32)
AST: 18 IU/L (ref 0–40)
Albumin/Globulin Ratio: 1.9 (ref 1.1–2.5)
Albumin: 4.3 g/dL (ref 3.5–5.5)
Alkaline Phosphatase: 81 IU/L (ref 39–117)
BUN/Creatinine Ratio: 13 (ref 8–20)
BUN: 8 mg/dL (ref 6–20)
Bilirubin Total: 0.2 mg/dL (ref 0.0–1.2)
CO2: 24 mmol/L (ref 18–29)
Calcium: 8.9 mg/dL (ref 8.7–10.2)
Chloride: 100 mmol/L (ref 97–108)
Creatinine, Ser: 0.62 mg/dL (ref 0.57–1.00)
GFR calc Af Amer: 136 mL/min/{1.73_m2} (ref >59)
GFR calc non Af Amer: 118 mL/min/{1.73_m2} (ref >59)
Globulin, Total: 2.3 g/dL (ref 1.5–4.5)
Glucose: 81 mg/dL (ref 65–99)
Potassium: 4.5 mmol/L (ref 3.5–5.2)
Sodium: 140 mmol/L (ref 134–144)
Total Protein: 6.6 g/dL (ref 6.0–8.5)

## 2015-04-11 LAB — CBC WITH DIFFERENTIAL/PLATELET
Basophils Absolute: 0 10*3/uL (ref 0.0–0.2)
Basos: 0 %
EOS (ABSOLUTE): 0 10*3/uL (ref 0.0–0.4)
Eos: 1 %
Hematocrit: 41.3 % (ref 34.0–46.6)
Hemoglobin: 13.7 g/dL (ref 11.1–15.9)
Immature Grans (Abs): 0 10*3/uL (ref 0.0–0.1)
Immature Granulocytes: 0 %
Lymphocytes Absolute: 3.7 10*3/uL — ABNORMAL HIGH (ref 0.7–3.1)
Lymphs: 43 %
MCH: 29.8 pg (ref 26.6–33.0)
MCHC: 33.2 g/dL (ref 31.5–35.7)
MCV: 90 fL (ref 79–97)
Monocytes Absolute: 0.6 10*3/uL (ref 0.1–0.9)
Monocytes: 7 %
Neutrophils Absolute: 4.2 10*3/uL (ref 1.4–7.0)
Neutrophils: 49 %
Platelets: 263 10*3/uL (ref 150–379)
RBC: 4.6 x10E6/uL (ref 3.77–5.28)
RDW: 14.6 % (ref 12.3–15.4)
WBC: 8.6 10*3/uL (ref 3.4–10.8)

## 2015-04-11 LAB — TSH: TSH: 4.17 u[IU]/mL (ref 0.450–4.500)

## 2015-04-11 LAB — HIV ANTIBODY (ROUTINE TESTING W REFLEX): HIV Screen 4th Generation wRfx: NONREACTIVE

## 2015-04-12 ENCOUNTER — Telehealth: Payer: Self-pay

## 2015-04-12 NOTE — Telephone Encounter (Signed)
Letter mailed

## 2015-04-12 NOTE — Telephone Encounter (Signed)
-----   Message from Arnetha Courser, MD sent at 04/11/2015  5:35 PM EDT ----- Regarding: can you send letter re: results? already typed I'm at the hospital, so I can't select any printers at [facility].  Letter is ready to go.  Thank you!

## 2015-04-17 ENCOUNTER — Emergency Department
Admission: EM | Admit: 2015-04-17 | Discharge: 2015-04-17 | Disposition: A | Discharge status: Home / Self Care | Payer: BLUE CROSS/BLUE SHIELD | Attending: Emergency Medicine | Admitting: Emergency Medicine

## 2015-04-17 ENCOUNTER — Emergency Department: Payer: BLUE CROSS/BLUE SHIELD

## 2015-04-17 ENCOUNTER — Encounter: Payer: Self-pay | Admitting: *Deleted

## 2015-04-17 DIAGNOSIS — S99922A Unspecified injury of left foot, initial encounter: Secondary | ICD-10-CM | POA: Diagnosis present

## 2015-04-17 DIAGNOSIS — Y9289 Other specified places as the place of occurrence of the external cause: Secondary | ICD-10-CM | POA: Insufficient documentation

## 2015-04-17 DIAGNOSIS — S92345A Nondisplaced fracture of fourth metatarsal bone, left foot, initial encounter for closed fracture: Secondary | ICD-10-CM | POA: Diagnosis not present

## 2015-04-17 DIAGNOSIS — S92302A Fracture of unspecified metatarsal bone(s), left foot, initial encounter for closed fracture: Secondary | ICD-10-CM

## 2015-04-17 DIAGNOSIS — Y990 Civilian activity done for income or pay: Secondary | ICD-10-CM | POA: Insufficient documentation

## 2015-04-17 DIAGNOSIS — Y9389 Activity, other specified: Secondary | ICD-10-CM | POA: Insufficient documentation

## 2015-04-17 DIAGNOSIS — Z87891 Personal history of nicotine dependence: Secondary | ICD-10-CM | POA: Insufficient documentation

## 2015-04-17 DIAGNOSIS — Z79899 Other long term (current) drug therapy: Secondary | ICD-10-CM | POA: Diagnosis not present

## 2015-04-17 DIAGNOSIS — Z791 Long term (current) use of non-steroidal anti-inflammatories (NSAID): Secondary | ICD-10-CM | POA: Insufficient documentation

## 2015-04-17 DIAGNOSIS — W208XXA Other cause of strike by thrown, projected or falling object, initial encounter: Secondary | ICD-10-CM | POA: Diagnosis not present

## 2015-04-17 MED ORDER — HYDROCODONE-ACETAMINOPHEN 5-325 MG PO TABS: 1.0000 | ORAL_TABLET | ORAL | Status: DC | PRN

## 2015-04-17 MED ORDER — IBUPROFEN 800 MG PO TABS: 800.0000 mg | ORAL_TABLET | Freq: Three times a day (TID) | ORAL | Status: DC | PRN

## 2015-04-17 NOTE — ED Provider Notes (Signed)
CSN: [id number]     Arrival date & time 04/17/15  1941 History   First MD Initiated Contact with Patient 04/17/15 1953     Chief Complaint  Patient presents with  . Foot Pain     (Consider location/radiation/quality/duration/timing/severity/associated sxs/prior Treatment) HPI Patient is here today with left foot pain that started 3 days ago when she dropped 2 pallets on her foot feeling a pop states that she thought it was okay she stepped down from a curb earlier today feeling a pop in his she is concerned about a fracture. Rates pain as about a 7 out of 10 throbbing type pain any type weightbearing making it worse holding it up and staying off it seems to make it better denies any other symptoms of note or complaints at this time Past Medical History  Diagnosis Date  . Allergy   . Asthma   . Endometriosis   . Polycystic ovarian disease   . Hypothyroidism   . Headache     otc med prn  . Anemia   . Vitamin D deficiency disease   . Thoracic back pain   . Anxiety     separation anxiety   Past Surgical History  Procedure Laterality Date  . Appendectomy  2008  . Shoulder surgery  2001    right  . Cesarean section  2014  . Laparoscopic hysterectomy N/A 01/02/2015    Procedure: HYSTERECTOMY TOTAL LAPAROSCOPIC;  Surgeon: Osborne Oman, MD;  Location: [facility];  Service: Gynecology;  Laterality: N/A;  . Laparoscopic bilateral salpingectomy Bilateral 01/02/2015    Procedure: LAPAROSCOPIC BILATERAL SALPINGECTOMY;  Surgeon: Osborne Oman, MD;  Location: [facility];  Service: Gynecology;  Laterality: Bilateral;  . Abdominal hysterectomy  March 2016    Due to uterus being stiched into C Section incision   Family History  Problem Relation Age of Onset  . Diabetes Father   . Osteoporosis Mother   . Cancer Maternal Grandmother     breast  . Osteoporosis Maternal Grandmother    History  Substance Use Topics  . Smoking status: Former Smoker -- 0.25 packs/day for 4 years    Types:  Cigarettes    Quit date: 04/17/2012  . Smokeless tobacco: Never Used  . Alcohol Use: No   OB History    Gravida Para Term Preterm AB TAB SAB Ectopic Multiple Living   1 1        1       Obstetric Comments   1st Menstrual Cycle:  12  1st Pregnancy:  31     Review of Systems  Constitutional: Negative.   HENT: Negative.   Eyes: Negative.   Respiratory: Negative.   Cardiovascular: Negative.   Gastrointestinal: Negative.   Skin: Negative.   All other systems reviewed and are negative.     Allergies  Depakote; Morphine; Tramadol; and Valproic acid  Home Medications   Prior to Admission medications   Medication Sig Start Date End Date Taking? Authorizing Provider  albuterol (PROVENTIL HFA;VENTOLIN HFA) 108 (90 BASE) MCG/ACT inhaler Inhale 2 puffs into the lungs every 6 (six) hours as needed for wheezing or shortness of breath.    Historical Provider, MD  fexofenadine (ALLEGRA) 180 MG tablet Take 180 mg by mouth daily.    Historical Provider, MD  HYDROcodone-acetaminophen (NORCO/VICODIN) 5-325 MG per tablet Take 1 tablet by mouth every 4 (four) hours as needed for moderate pain. 04/17/15   Jerlisa Diliberto William C Madlyn Crosby, PA-C  ibuprofen (ADVIL,MOTRIN) 800 MG tablet Take 1  tablet (800 mg total) by mouth every 8 (eight) hours as needed. 04/17/15   Chey Cho Luanna Cole Rhian Asebedo, PA-C  levothyroxine (SYNTHROID, LEVOTHROID) 75 MCG tablet Take 75 mcg by mouth daily before breakfast.  12/30/12   Historical Provider, MD  mometasone (NASONEX) 50 MCG/ACT nasal spray Place 2 sprays into the nose daily.    Arnetha Courser, MD  montelukast (SINGULAIR) 10 MG tablet Take 10 mg by mouth at bedtime.    Historical Provider, MD  naproxen (NAPROSYN) 500 MG tablet Take 1 tablet (500 mg total) by mouth 2 (two) times daily with a meal. Do NOT take any other NSAIDs 04/10/15   Arnetha Courser, MD  SUMAtriptan (IMITREX) 100 MG tablet Take 100 mg by mouth every 2 (two) hours as needed for migraine. May repeat in 2 hours if headache  persists or recurs.    Historical Provider, MD  topiramate (TOPAMAX) 25 MG tablet One pill PO daily x 1 week, then two pills daily x 1 week, then three pills daily x 1 week, then four pills daily 04/10/15   Arnetha Courser, MD  valACYclovir (VALTREX) 1000 MG tablet Take 1 tablet (1,000 mg total) by mouth 3 (three) times daily. 04/10/15   Arnetha Courser, MD   BP 119/83 mmHg  Pulse 95  Temp(Src) 98.2 F (36.8 C)  Resp 16  Ht 5\' 8"  (1.727 m)  Wt 209 lb (94.802 kg)  BMI 31.79 kg/m2  SpO2 97%  LMP 10/30/2014 Physical Exam Female appearing stated age Well-developed well-nourished no acute distress Vitals were reviewed Head ears eyes nose neck and throat examinations patient unremarkable Cardiovascular regular rate and rhythm no murmurs rubs gallops Pulmonary lungs clear to auscultation bilaterally Musculoskeletal she has pain with palpation over the distal metatarsals over fourth and fifth left foot without palpable deformity or abnormality good distal sensation Refill mild bruising Neuro exams nonfocal cranial nerves II through XII grossly intact Skin free of rash disease or trauma ED Course  Procedures (including critical care time) patient placed an Ace wrap and orthopedic shoe and crutches female appearing stated age Labs Review Labs Reviewed - No data to display  Imaging Review Dg Foot Complete Left  04/17/2015   CLINICAL DATA:  Pain after dropping pallet on left foot 3 days prior  EXAM: LEFT FOOT - COMPLETE 3+ VIEW  COMPARISON:  August 11, 2014  FINDINGS: Frontal, oblique, and lateral views obtained. There is a transverse lucency in the distal fourth metatarsal, concerning for nondisplaced fracture in this area. This finding is best seen on frontal view. No other evidence suggesting fracture. No dislocation. Joint spaces appear intact.  IMPRESSION: Transverse lucency distal fourth metatarsal concerning for nondisplaced fracture.   Electronically Signed   By: Lowella Grip Brandonn Capelli M.D.   On:  04/17/2015 20:59     EKG Interpretation None      MDM  Patient has nondisplaced metatarsal fracture placed in an orthopedic shoe crutches have follow-up with orthopedics in 1 week was given prescriptions for Motrin and Norco recommend Rice elevation compression Final diagnoses:  Metatarsal fracture, left, closed, initial encounter       Ashad Fawbush Verdene Rio, PA-C 04/17/15 [place], MD 04/18/15 [phone]

## 2015-04-17 NOTE — Discharge Instructions (Signed)
Cast Shoe Cast shoes are rigid shoes that help you walk with a cast. Cast shoes help treat minor foot sprains and fractures. These shoes reduce the amount of movement in the joints of the foot. This makes walking less painful. As long as your injury is painful, you should use crutches to get around. Avoid all weight bearing until your caregiver approves. When your caregiver says it is safe to put weight on your injured leg or foot, you can begin to use your cast shoe. Make sure it is adjusted right. Ask your caregiver to show you how to adjust it if you are not sure. Most of these shoes have velcro straps which make this easy. You should avoid using any other shoes until your caregiver says it is safe. Document Released: 11/27/2004 Document Revised: 01/12/2012 Document Reviewed: 11/16/2008 [facility] Patient Information 2015 [place], [place]. This information is not intended to replace advice given to you by your health care provider. Make sure you discuss any questions you have with your health care provider.

## 2015-04-17 NOTE — ED Notes (Signed)
Pt reports dropping 2 pallets on left foot while at work. Pts able to move foot, reports pain with movement and with palpation. Pt reports while walking yesterday, feeling a "pop"

## 2015-05-09 ENCOUNTER — Encounter: Payer: Self-pay | Admitting: Family Medicine

## 2015-05-09 ENCOUNTER — Ambulatory Visit (INDEPENDENT_AMBULATORY_CARE_PROVIDER_SITE_OTHER): Payer: BLUE CROSS/BLUE SHIELD | Admitting: Family Medicine

## 2015-05-09 VITALS — BP 100/69 | HR 92 | Temp 98.9°F | Wt 204.0 lb

## 2015-05-09 DIAGNOSIS — G43109 Migraine with aura, not intractable, without status migrainosus: Secondary | ICD-10-CM

## 2015-05-09 DIAGNOSIS — E039 Hypothyroidism, unspecified: Secondary | ICD-10-CM

## 2015-05-09 DIAGNOSIS — F4310 Post-traumatic stress disorder, unspecified: Secondary | ICD-10-CM | POA: Diagnosis not present

## 2015-05-09 MED ORDER — LEVOTHYROXINE SODIUM 75 MCG PO TABS: 75.0000 ug | ORAL_TABLET | Freq: Every day | ORAL | Status: DC

## 2015-05-09 MED ORDER — TOPIRAMATE 100 MG PO TABS: 100.0000 mg | ORAL_TABLET | Freq: Every day | ORAL | Status: DC

## 2015-05-09 NOTE — Assessment & Plan Note (Signed)
Last TSH normal; refilled medicine

## 2015-05-09 NOTE — Progress Notes (Signed)
 BP 100/69 mmHg  Pulse 92  Temp(Src) 98.9 F (37.2 C)  Wt 204 lb (92.534 kg)  SpO2 99%  LMP 10/30/2014   Subjective:    Patient ID: Renee Benjamin, female    DOB: 06/09/1981, 34 y.o.   MRN: [id number]  HPI: Renee Benjamin is a 34 y.o. female  Chief Complaint  Patient presents with  . Migraine  . Shingles   She dropped a 2 liter bottle opening side down on her left foot since last visit; saw foot doctor; okay  The topamax is amazing; she has only had two migraines; she went out of town for an emergency and forgot to take it with her and that night she had a migraine  She is having some anxiety she thinks, might be more anxiety than food or other environment things; might be triggers for her migraines Now that she is on topamax, things feel a little out of control; she has more energy and isn't tired all time, not manic; trouble concentrating, trouble staying focused on things; finally her head isn't hurting so bad; she can do so much stuff; she might be trying to do too much If she sees the aura, she can lay down and it will go away; the only time she has had to imitrex was with heat and being dehydrated Food not really a trigger 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 Interim medical history since our last visit reviewed. Allergies and medications reviewed and updated.  Review of Systems  Per HPI unless specifically indicated above     Objective:    BP 100/69 mmHg  Pulse 92  Temp(Src) 98.9 F (37.2 C)  Wt 204 lb (92.534 kg)  SpO2 99%  LMP 10/30/2014  Wt Readings from Last 3 Encounters:  05/09/15 204 lb (92.534 kg)  04/17/15 209 lb (94.802 kg)  04/10/15 206 lb (93.441 kg)    Physical Exam  Constitutional: She appears well-developed and well-nourished. No  distress.  Weight loss five pounds since last visit  Eyes: EOM are normal. No scleral icterus.  Neck: No thyromegaly present.  Cardiovascular: Normal rate.   Pulmonary/Chest: Effort normal.  Abdominal: She exhibits no distension.  Skin: No pallor.  Psychiatric: She has a normal mood and affect. Her behavior is normal. Judgment and thought content normal.    Results for orders placed or performed in visit on 04/10/15  CBC w/Diff  Result Value Ref Range   WBC 8.6 3.4 - 10.8 x10E3/uL   RBC 4.60 3.77 - 5.28 x10E6/uL   Hemoglobin 13.7 11.1 - 15.9 g/dL   Hematocrit 41.3 34.0 - 46.6 %   MCV 90 79 - 97 fL   MCH 29.8 26.6 - 33.0 pg   MCHC 33.2 31.5 - 35.7 g/dL   RDW 14.6 12.3 - 15.4 %   Platelets 263 150 - 379 x10E3/uL   NEUTROPHILS 49 %   Lymphs 43 %   Monocytes 7 %   Eos 1 %   Basos 0 %   Neutrophils Absolute 4.2 1.4 - 7.0 x10E3/uL   Lymphocytes Absolute 3.7 (H) 0.7 - 3.1 x10E3/uL   Monocytes Absolute 0.6 0.1 - 0.9 x10E3/uL   EOS (ABSOLUTE) 0.0 0.0 - 0.4 x10E3/uL   Basophils Absolute 0.0 0.0 - 0.2 x10E3/uL   Immature Granulocytes 0 %     Immature Grans (Abs) 0.0 0.0 - 0.1 x10E3/uL  TSH  Result Value Ref Range   TSH 4.170 0.450 - 4.500 uIU/mL  Lipid panel  Result Value Ref Range   Cholesterol, Total 199 100 - 199 mg/dL   Triglycerides 101 0 - 149 mg/dL   HDL 52 >39 mg/dL   VLDL Cholesterol Cal 20 5 - 40 mg/dL   LDL Calculated 127 (H) 0 - 99 mg/dL   Chol/HDL Ratio 3.8 0.0 - 4.4 ratio units  HIV antibody (with reflex)  Result Value Ref Range   HIV Screen 4th Generation wRfx Non Reactive Non Reactive  Comp Met (CMET)  Result Value Ref Range   Glucose 81 65 - 99 mg/dL   BUN 8 6 - 20 mg/dL   Creatinine, Ser 0.62 0.57 - 1.00 mg/dL   GFR calc non Af Amer 118 >59 mL/min/1.73   GFR calc Af Amer 136 >59 mL/min/1.73   BUN/Creatinine Ratio 13 8 - 20   Sodium 140 134 - 144 mmol/L   Potassium 4.5 3.5 - 5.2 mmol/L   Chloride 100 97 - 108 mmol/L   CO2 24 18 - 29 mmol/L   Calcium  8.9 8.7 - 10.2 mg/dL   Total Protein 6.6 6.0 - 8.5 g/dL   Albumin 4.3 3.5 - 5.5 g/dL   Globulin, Total 2.3 1.5 - 4.5 g/dL   Albumin/Globulin Ratio 1.9 1.1 - 2.5   Bilirubin Total 0.2 0.0 - 1.2 mg/dL   Alkaline Phosphatase 81 39 - 117 IU/L   AST 18 0 - 40 IU/L   ALT 17 0 - 32 IU/L      Assessment & Plan:   Problem List Items Addressed This Visit      Cardiovascular and Mediastinum   Migraine headache with aura - Primary    Much better with the topamax; continue current med, 100 mg every night; new Rx sent      Relevant Medications   topiramate (TOPAMAX) 100 MG tablet     Endocrine   Hypothyroidism    Last TSH normal; refilled medicine      Relevant Medications   levothyroxine (SYNTHROID, LEVOTHROID) 75 MCG tablet     Other   PTSD (post-traumatic stress disorder)    Patient will check out psychology today for therapists          Follow up plan: Return in about 3 months (around 08/09/2015) for migraines.       

## 2015-05-09 NOTE — Assessment & Plan Note (Signed)
Patient will check out psychology today for therapists

## 2015-05-09 NOTE — Patient Instructions (Addendum)
Let's continue the topamax (topiramate) 100 mg every night; don't ever stop this abruptly  Check out the information at familydoctor.org entitled "What It Takes to Lose Weight" Try to lose between 1-2 pounds per week by taking in fewer calories and burning off more calories You can succeed by limiting portions, limiting foods dense in calories and fat, becoming more active, and drinking 8 glasses of water a day Don't skip meals, especially breakfast, as skipping meals may alter your metabolism Do not use over-the-counter weight loss pills or gimmicks that claim rapid weight loss A healthy BMI (or body mass index) is between 18.5 and 24.9 You can calculate your ideal BMI at the Alic[employer] website ClubMonetize.fr  Use unsweetened tea or ask the wait staff to mix your tea half sweetened and half unsweetened  Check out Psychology Today [place], [place]

## 2015-05-09 NOTE — Assessment & Plan Note (Signed)
Much better with the topamax; continue current med, 100 mg every night; new Rx sent

## 2015-07-13 ENCOUNTER — Encounter: Payer: Self-pay | Admitting: Family Medicine

## 2015-07-13 ENCOUNTER — Ambulatory Visit (INDEPENDENT_AMBULATORY_CARE_PROVIDER_SITE_OTHER): Payer: Medicaid Other | Admitting: Family Medicine

## 2015-07-13 VITALS — BP 113/75 | HR 87 | Temp 98.2°F | Ht 67.0 in | Wt 199.0 lb

## 2015-07-13 DIAGNOSIS — Z0000 Encounter for general adult medical examination without abnormal findings: Secondary | ICD-10-CM

## 2015-07-13 DIAGNOSIS — D489 Neoplasm of uncertain behavior, unspecified: Secondary | ICD-10-CM

## 2015-07-13 NOTE — Progress Notes (Signed)
Patient ID: Renee Benjamin, female   DOB: 1981/03/23, 34 y.o.   MRN: [id number]   Subjective:   Renee Benjamin is a 34 y.o. female here for a complete physical exam  Interim issues since last visit: abscessed tooth; antibiotics and pain medicine; fixed yesterday by dentist; no fevers, just sore  Just had hystectomy in March, sewn into her C-section scar so they had to take it out; reached to lift her son up and felt pain in the lower left quadrant; bruised, uterus ripped away from the wall  USPSTF guidelines grade A and B: Alcohol: n/a Tobacco: n/a Depression screen PHQ 2/9 07/13/2015  Decreased Interest 0  Down, Depressed, Hopeless 0  PHQ - 2 Score 0  obesity: losing weight, no bread, no soda, on topamax HIV, hep: patient politely declines No abuse Cervical cancer screening: no cervix, s/p hyst Lipids and glucose: normal Colorectal: wait until age 9 Breast cancer: grandmother in her early 80s Diet: mostly chicken and veggies Skin cancer: using sunscreen  Past Medical History  Diagnosis Date  . Allergy   . Asthma   . Endometriosis   . Polycystic ovarian disease   . Hypothyroidism   . Headache     otc med prn  . Anemia   . Vitamin D deficiency disease   . Thoracic back pain   . Anxiety     separation anxiety   Past Surgical History  Procedure Laterality Date  . Appendectomy  2008  . Shoulder surgery  2001    right  . Cesarean section  2014  . Laparoscopic hysterectomy N/A 01/02/2015    Procedure: HYSTERECTOMY TOTAL LAPAROSCOPIC;  Surgeon: Osborne Oman, MD;  Location: [facility];  Service: Gynecology;  Laterality: N/A;  . Laparoscopic bilateral salpingectomy Bilateral 01/02/2015    Procedure: LAPAROSCOPIC BILATERAL SALPINGECTOMY;  Surgeon: Osborne Oman, MD;  Location: [facility];  Service: Gynecology;  Laterality: Bilateral;  . Abdominal hysterectomy  March 2016    Due to uterus being stiched into C Section incision   Family History  Problem Relation Age of  Onset  . Diabetes Father   . Osteoporosis Mother   . Cancer Maternal Grandmother     breast  . Osteoporosis Maternal Grandmother    Social History  Substance Use Topics  . Smoking status: Former Smoker -- 0.25 packs/day for 4 years    Types: Cigarettes    Quit date: 04/17/2012  . Smokeless tobacco: Never Used  . Alcohol Use: No   Review of Systems  Constitutional: Negative for fever and unexpected weight change.  HENT: Positive for dental problem. Negative for nosebleeds.   Eyes: Negative for visual disturbance.  Respiratory: Negative for shortness of breath and wheezing.   Cardiovascular: Negative for chest pain and leg swelling.  Gastrointestinal: Negative for blood in stool.  Endocrine: Positive for heat intolerance (just a little, runs hot) and polydipsia (always thirsty, normal glucose in June; not new).  Genitourinary: Negative for hematuria.  Skin:       Mole in between legs, has changed a lot in last few years; bothers her, hurts; getting bigger  Allergic/Immunologic: Positive for food allergies (mushrooms; causes nausea).  Neurological: Positive for headaches (occasional migraines with aura).  Hematological: Negative for adenopathy. Does not bruise/bleed easily.  Psychiatric/Behavioral: Negative for dysphoric mood.   Objective:   Filed Vitals:   07/13/15 1458 07/13/15 1519  BP: 104/93 113/75  Pulse: 78 87  Temp: 98.2 F (36.8 C)   Height: 5\' 7"  (1.702 m)  Weight: 199 lb (90.266 kg)   SpO2: 97%    Body mass index is 31.16 kg/(m^2). Wt Readings from Last 3 Encounters:  07/13/15 199 lb (90.266 kg)  05/09/15 204 lb (92.534 kg)  04/17/15 209 lb (94.802 kg)   Physical Exam  Constitutional: She appears well-developed and well-nourished.  HENT:  Head: Normocephalic and atraumatic.  Right Ear: Hearing, tympanic membrane, external ear and ear canal normal.  Left Ear: Hearing, tympanic membrane, external ear and ear canal normal.  Eyes: Conjunctivae and EOM are  normal. Right eye exhibits no hordeolum. Left eye exhibits no hordeolum. No scleral icterus.  Neck: Carotid bruit is not present. No thyromegaly present.  Cardiovascular: Normal rate, regular rhythm, S1 normal, S2 normal and normal heart sounds.   No extrasystoles are present.  Pulmonary/Chest: Effort normal and breath sounds normal. No respiratory distress. Right breast exhibits no inverted nipple, no mass, no nipple discharge, no skin change and no tenderness. Left breast exhibits no inverted nipple, no mass, no nipple discharge, no skin change and no tenderness. Breasts are symmetrical.  Abdominal: Soft. Normal appearance and bowel sounds are normal. She exhibits no distension, no abdominal bruit, no pulsatile midline mass and no mass. There is no hepatosplenomegaly. There is no tenderness. No hernia.  Musculoskeletal: Normal range of motion. She exhibits no edema.  Lymphadenopathy:       Head (right side): No submandibular adenopathy present.       Head (left side): No submandibular adenopathy present.    She has no cervical adenopathy.    She has no axillary adenopathy.  Neurological: She is alert. She displays no tremor. No cranial nerve deficit. She exhibits normal muscle tone. Gait normal.  Reflex Scores:      Patellar reflexes are 2+ on the right side and 2+ on the left side. Skin: Skin is warm and dry. No bruising and no ecchymosis noted. No cyanosis. No pallor.  Reddish nevus with irregular surface medial proximal left thigh; soft; no hard or keratotic; about 8 mm diameter  Psychiatric: Her speech is normal and behavior is normal. Thought content normal. Her mood appears not anxious. She does not exhibit a depressed mood.   Assessment/Plan:   Problem List Items Addressed This Visit    None    Visit Diagnoses    Preventative health care    -  Primary    USPSTF grade A and B recs reviewed; healthy living encouraged; calcium, don't text and drive, etc.    Neoplasm of uncertain  behavior        skin; refer to dermatologist (female requested by patient) for evaluation, excision    Relevant Orders    Ambulatory referral to Dermatology       Orders Placed This Encounter  Procedures  . Ambulatory referral to Dermatology   Follow up plan: Return in about 1 year (around 07/12/2016) for next physical.  An After Visit Summary was printed and given to the patient. See copy.

## 2015-07-13 NOTE — Patient Instructions (Addendum)
Please do get a baseline mammogram at age 35, then yearly at age 56 Another group recommends waiting until age 57, but I don't think that will be appropriate for you   Health Maintenance Adopting a healthy lifestyle and getting preventive care can go a long way to promote health and wellness. Talk with your health care provider about what schedule of regular examinations is right for you. This is a good chance for you to check in with your provider about disease prevention and staying healthy. In between checkups, there are plenty of things you can do on your own. Experts have done a lot of research about which lifestyle changes and preventive measures are most likely to keep you healthy. Ask your health care provider for more information. WEIGHT AND DIET  Eat a healthy diet  Be sure to include plenty of vegetables, fruits, low-fat dairy products, and lean protein.  Do not eat a lot of foods high in solid fats, added sugars, or salt.  Get regular exercise. This is one of the most important things you can do for your health.  Most adults should exercise for at least 150 minutes each week. The exercise should increase your heart rate and make you sweat (moderate-intensity exercise).  Most adults should also do strengthening exercises at least twice a week. This is in addition to the moderate-intensity exercise.  Maintain a healthy weight  Body mass index (BMI) is a measurement that can be used to identify possible weight problems. It estimates body fat based on height and weight. Your health care provider can help determine your BMI and help you achieve or maintain a healthy weight.  For females 47 years of age and older:   A BMI below 18.5 is considered underweight.  A BMI of 18.5 to 24.9 is normal.  A BMI of 25 to 29.9 is considered overweight.  A BMI of 30 and above is considered obese.  Watch levels of cholesterol and blood lipids  You should start having your blood tested for  lipids and cholesterol at 35 years of age, then have this test every 5 years.  You may need to have your cholesterol levels checked more often if:  Your lipid or cholesterol levels are high.  You are older than 35 years of age.  You are at high risk for heart disease.  CANCER SCREENING   Lung Cancer  Lung cancer screening is recommended for adults 14-70 years old who are at high risk for lung cancer because of a history of smoking.  A yearly low-dose CT scan of the lungs is recommended for people who:  Currently smoke.  Have quit within the past 15 years.  Have at least a 30-pack-year history of smoking. A pack year is smoking an average of one pack of cigarettes a day for 1 year.  Yearly screening should continue until it has been 15 years since you quit.  Yearly screening should stop if you develop a health problem that would prevent you from having lung cancer treatment.  Breast Cancer  Practice breast self-awareness. This means understanding how your breasts normally appear and feel.  It also means doing regular breast self-exams. Let your health care provider know about any changes, no matter how small.  If you are in your 20s or 30s, you should have a clinical breast exam (CBE) by a health care provider every 1-3 years as part of a regular health exam.  If you are 56 or older, have a CBE every  Also consider having a breast X-ray (mammogram) every year.  If you have a family history of breast cancer, talk to your health care provider about genetic screening.  If you are at high risk for breast cancer, talk to your health care provider about having an MRI and a mammogram every year.  Breast cancer gene (BRCA) assessment is recommended for women who have family members with BRCA-related cancers. BRCA-related cancers include:  Breast.  Ovarian.  Tubal.  Peritoneal cancers.  Results of the assessment will determine the need for genetic counseling and BRCA1  and BRCA2 testing. Cervical Cancer Routine pelvic examinations to screen for cervical cancer are no longer recommended for nonpregnant women who are considered low risk for cancer of the pelvic organs (ovaries, uterus, and vagina) and who do not have symptoms. A pelvic examination may be necessary if you have symptoms including those associated with pelvic infections. Ask your health care provider if a screening pelvic exam is right for you.   The Pap test is the screening test for cervical cancer for women who are considered at risk.  If you had a hysterectomy for a problem that was not cancer or a condition that could lead to cancer, then you no longer need Pap tests.  If you are older than 65 years, and you have had normal Pap tests for the past 10 years, you no longer need to have Pap tests.  If you have had past treatment for cervical cancer or a condition that could lead to cancer, you need Pap tests and screening for cancer for at least 20 years after your treatment.  If you no longer get a Pap test, assess your risk factors if they change (such as having a new sexual partner). This can affect whether you should start being screened again.  Some women have medical problems that increase their chance of getting cervical cancer. If this is the case for you, your health care provider may recommend more frequent screening and Pap tests.  The human papillomavirus (HPV) test is another test that may be used for cervical cancer screening. The HPV test looks for the virus that can cause cell changes in the cervix. The cells collected during the Pap test can be tested for HPV.  The HPV test can be used to screen women 30 years of age and older. Getting tested for HPV can extend the interval between normal Pap tests from three to five years.  An HPV test also should be used to screen women of any age who have unclear Pap test results.  After 35 years of age, women should have HPV testing as often  as Pap tests.  Colorectal Cancer  This type of cancer can be detected and often prevented.  Routine colorectal cancer screening usually begins at 35 years of age and continues through 35 years of age.  Your health care provider may recommend screening at an earlier age if you have risk factors for colon cancer.  Your health care provider may also recommend using home test kits to check for hidden blood in the stool.  A small camera at the end of a tube can be used to examine your colon directly (sigmoidoscopy or colonoscopy). This is done to check for the earliest forms of colorectal cancer.  Routine screening usually begins at age 50.  Direct examination of the colon should be repeated every 5-10 years through 35 years of age. However, you may need to be screened more often if early forms   of precancerous polyps or small growths are found. Skin Cancer  Check your skin from head to toe regularly.  Tell your health care provider about any new moles or changes in moles, especially if there is a change in a mole's shape or color.  Also tell your health care provider if you have a mole that is larger than the size of a pencil eraser.  Always use sunscreen. Apply sunscreen liberally and repeatedly throughout the day.  Protect yourself by wearing long sleeves, pants, a wide-brimmed hat, and sunglasses whenever you are outside. HEART DISEASE, DIABETES, AND HIGH BLOOD PRESSURE   Have your blood pressure checked at least every 1-2 years. High blood pressure causes heart disease and increases the risk of stroke.  If you are between 55 years and 79 years old, ask your health care provider if you should take aspirin to prevent strokes.  Have regular diabetes screenings. This involves taking a blood sample to check your fasting blood sugar level.  If you are at a normal weight and have a low risk for diabetes, have this test once every three years after 35 years of age.  If you are overweight  and have a high risk for diabetes, consider being tested at a younger age or more often. PREVENTING INFECTION  Hepatitis B  If you have a higher risk for hepatitis B, you should be screened for this virus. You are considered at high risk for hepatitis B if:  You were born in a country where hepatitis B is common. Ask your health care provider which countries are considered high risk.  Your parents were born in a high-risk country, and you have not been immunized against hepatitis B (hepatitis B vaccine).  You have HIV or AIDS.  You use needles to inject street drugs.  You live with someone who has hepatitis B.  You have had sex with someone who has hepatitis B.  You get hemodialysis treatment.  You take certain medicines for conditions, including cancer, organ transplantation, and autoimmune conditions. Hepatitis C  Blood testing is recommended for:  Everyone born from 1945 through 1965.  Anyone with known risk factors for hepatitis C. Sexually transmitted infections (STIs)  You should be screened for sexually transmitted infections (STIs) including gonorrhea and chlamydia if:  You are sexually active and are younger than 35 years of age.  You are older than 35 years of age and your health care provider tells you that you are at risk for this type of infection.  Your sexual activity has changed since you were last screened and you are at an increased risk for chlamydia or gonorrhea. Ask your health care provider if you are at risk.  If you do not have HIV, but are at risk, it may be recommended that you take a prescription medicine daily to prevent HIV infection. This is called pre-exposure prophylaxis (PrEP). You are considered at risk if:  You are sexually active and do not regularly use condoms or know the HIV status of your partner(s).  You take drugs by injection.  You are sexually active with a partner who has HIV. Talk with your health care provider about whether  you are at high risk of being infected with HIV. If you choose to begin PrEP, you should first be tested for HIV. You should then be tested every 3 months for as long as you are taking PrEP.  PREGNANCY   If you are premenopausal and you may become pregnant, ask your health   care provider about preconception counseling.  If you may become pregnant, take 400 to 800 micrograms (mcg) of folic acid every day.  If you want to prevent pregnancy, talk to your health care provider about birth control (contraception). OSTEOPOROSIS AND MENOPAUSE   Osteoporosis is a disease in which the bones lose minerals and strength with aging. This can result in serious bone fractures. Your risk for osteoporosis can be identified using a bone density scan.  If you are 65 years of age or older, or if you are at risk for osteoporosis and fractures, ask your health care provider if you should be screened.  Ask your health care provider whether you should take a calcium or vitamin D supplement to lower your risk for osteoporosis.  Menopause may have certain physical symptoms and risks.  Hormone replacement therapy may reduce some of these symptoms and risks. Talk to your health care provider about whether hormone replacement therapy is right for you.  HOME CARE INSTRUCTIONS   Schedule regular health, dental, and eye exams.  Stay current with your immunizations.   Do not use any tobacco products including cigarettes, chewing tobacco, or electronic cigarettes.  If you are pregnant, do not drink alcohol.  If you are breastfeeding, limit how much and how often you drink alcohol.  Limit alcohol intake to no more than 1 drink per day for nonpregnant women. One drink equals 12 ounces of beer, 5 ounces of wine, or 1 ounces of hard liquor.  Do not use street drugs.  Do not share needles.  Ask your health care provider for help if you need support or information about quitting drugs.  Tell your health care  provider if you often feel depressed.  Tell your health care provider if you have ever been abused or do not feel safe at home. Document Released: 05/05/2011 Document Revised: 03/06/2014 Document Reviewed: 09/21/2013 [employer] Patient Information 2015 [employer]. This information is not intended to replace advice given to you by your health care provider. Make sure you discuss any questions you have with your health care provider.  

## 2015-08-01 ENCOUNTER — Encounter: Payer: Self-pay | Admitting: Family Medicine

## 2015-08-01 ENCOUNTER — Ambulatory Visit (INDEPENDENT_AMBULATORY_CARE_PROVIDER_SITE_OTHER): Payer: Medicaid Other | Admitting: Family Medicine

## 2015-08-01 VITALS — BP 113/78 | HR 96 | Temp 98.7°F | Ht 68.0 in | Wt 195.8 lb

## 2015-08-01 DIAGNOSIS — J019 Acute sinusitis, unspecified: Secondary | ICD-10-CM

## 2015-08-01 DIAGNOSIS — T17208A Unspecified foreign body in pharynx causing other injury, initial encounter: Secondary | ICD-10-CM

## 2015-08-01 MED ORDER — AMOXICILLIN-POT CLAVULANATE 875-125 MG PO TABS: 1.0000 | ORAL_TABLET | Freq: Two times a day (BID) | ORAL | Status: DC

## 2015-08-01 MED ORDER — FLUCONAZOLE 150 MG PO TABS: 150.0000 mg | ORAL_TABLET | Freq: Once | ORAL | Status: DC

## 2015-08-01 NOTE — Progress Notes (Signed)
BP 113/78 mmHg  Pulse 96  Temp(Src) 98.7 F (37.1 C)  Ht '5\' 8"'  (1.727 m)  Wt 195 lb 12.8 oz (88.814 kg)  BMI 29.78 kg/m2  SpO2 98%  LMP 10/30/2014   Subjective:    Patient ID: Renee Benjamin, female    DOB: July 06, 1981, 34 y.o.   MRN: [id number]  HPI: Renee Benjamin is a 34 y.o. female  Chief Complaint  Patient presents with  . Sore Throat    Pt says a white spot on back of her tonsils  . Cough   patient with fever and chills nasal congestion and facial pressure. 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 Patient especially with sore throat soreness with white spot on the back of her tonsils difficulty swallowing. Patient was able to gargle off some of these other spots. Patient also coughing nonproductive Years from ear infection treated at walk-in 2 weeks ago were better.  Relevant past medical, surgical, family and social history reviewed and updated as indicated. Interim medical history since our last visit reviewed. Allergies and medications reviewed and updated.  Review of Systems  Constitutional: Positive for fever, chills and fatigue.  HENT: Positive for rhinorrhea, sinus pressure, sneezing, sore throat and trouble swallowing. Negative for ear pain and hearing loss.   Respiratory: Positive for cough.   Cardiovascular: Negative.   Gastrointestinal: Negative.   Musculoskeletal: Negative for arthralgias.    Per HPI unless specifically indicated above     Objective:    BP 113/78 mmHg  Pulse 96  Temp(Src) 98.7 F (37.1 C)  Ht '5\' 8"'  (1.727 m)  Wt 195 lb 12.8 oz (88.814 kg)  BMI 29.78 kg/m2  SpO2 98%  LMP 10/30/2014  Wt Readings from Last 3 Encounters:  08/01/15 195 lb 12.8 oz (88.814 kg)  07/13/15 199 lb (90.266 kg)  05/09/15 204 lb (92.534 kg)    Physical Exam  Constitutional: She is oriented to person, place, and time. She appears well-developed and  well-nourished. No distress.  HENT:  Head: Normocephalic and atraumatic.  Right Ear: Hearing and external ear normal.  Left Ear: Hearing and external ear normal.  Nose: Nose normal.  Eyes: Conjunctivae and lids are normal. Right eye exhibits no discharge. Left eye exhibits no discharge. No scleral icterus.  Neck:  Posterior pharynx tonsillar crypt with large retained clump of food with pus. Using instruments foreign body removed with relief of symptoms to patient.  Cardiovascular: Normal rate, regular rhythm and normal heart sounds.   Pulmonary/Chest: Effort normal and breath sounds normal. No respiratory distress.  Musculoskeletal: Normal range of motion.  Lymphadenopathy:    She has no cervical adenopathy.  Neurological: She is alert and oriented to person, place, and time.  Skin: Skin is intact. No rash noted.  Psychiatric: She has a normal mood and affect. Her speech is normal and behavior is normal. Judgment and thought content normal. Cognition and memory are normal.    Results for orders placed or performed in visit on 04/10/15  CBC w/Diff  Result Value Ref Range   WBC 8.6 3.4 - 10.8 x10E3/uL   RBC 4.60 3.77 - 5.28 x10E6/uL   Hemoglobin 13.7 11.1 - 15.9 g/dL   Hematocrit 41.3 34.0 - 46.6 %   MCV 90 79 - 97 fL   MCH 29.8 26.6 - 33.0 pg   MCHC 33.2 31.5 - 35.7 g/dL   RDW 14.6 12.3 -  15.4 %   Platelets 263 150 - 379 x10E3/uL   Neutrophils 49 %   Lymphs 43 %   Monocytes 7 %   Eos 1 %   Basos 0 %   Neutrophils Absolute 4.2 1.4 - 7.0 x10E3/uL   Lymphocytes Absolute 3.7 (H) 0.7 - 3.1 x10E3/uL   Monocytes Absolute 0.6 0.1 - 0.9 x10E3/uL   EOS (ABSOLUTE) 0.0 0.0 - 0.4 x10E3/uL   Basophils Absolute 0.0 0.0 - 0.2 x10E3/uL   Immature Granulocytes 0 %   Immature Grans (Abs) 0.0 0.0 - 0.1 x10E3/uL  TSH  Result Value Ref Range   TSH 4.170 0.450 - 4.500 uIU/mL  Lipid panel  Result Value Ref Range   Cholesterol, Total 199 100 - 199 mg/dL   Triglycerides 101 0 - 149 mg/dL   HDL  52 >39 mg/dL   VLDL Cholesterol Cal 20 5 - 40 mg/dL   LDL Calculated 127 (H) 0 - 99 mg/dL   Chol/HDL Ratio 3.8 0.0 - 4.4 ratio units  HIV antibody (with reflex)  Result Value Ref Range   HIV Screen 4th Generation wRfx Non Reactive Non Reactive  Comp Met (CMET)  Result Value Ref Range   Glucose 81 65 - 99 mg/dL   BUN 8 6 - 20 mg/dL   Creatinine, Ser 0.62 0.57 - 1.00 mg/dL   GFR calc non Af Amer 118 >59 mL/min/1.73   GFR calc Af Amer 136 >59 mL/min/1.73   BUN/Creatinine Ratio 13 8 - 20   Sodium 140 134 - 144 mmol/L   Potassium 4.5 3.5 - 5.2 mmol/L   Chloride 100 97 - 108 mmol/L   CO2 24 18 - 29 mmol/L   Calcium 8.9 8.7 - 10.2 mg/dL   Total Protein 6.6 6.0 - 8.5 g/dL   Albumin 4.3 3.5 - 5.5 g/dL   Globulin, Total 2.3 1.5 - 4.5 g/dL   Albumin/Globulin Ratio 1.9 1.1 - 2.5   Bilirubin Total 0.2 0.0 - 1.2 mg/dL   Alkaline Phosphatase 81 39 - 117 IU/L   AST 18 0 - 40 IU/L   ALT 17 0 - 32 IU/L      Assessment & Plan:   Problem List Items Addressed This Visit    None    Visit Diagnoses    Acute sinusitis, recurrence not specified, unspecified location    -  Primary    Discussed sinusitis care and treatment use of over-the-counter medications, Tylenol Discuss the decongestant products behind the counter Robitussin for cough     Relevant Medications    brompheniramine-pseudoephedrine-DM 30-2-10 MG/5ML syrup    amoxicillin-clavulanate (AUGMENTIN) 875-125 MG tablet    fluconazole (DIFLUCAN) 150 MG tablet    Foreign body of throat, initial encounter        Removal as noted above       Discuss use of Diflucan is usually gets yeast infection after antibiotics Follow up plan: Return if symptoms worsen or fail to improve, for As scheduled.

## 2015-08-09 ENCOUNTER — Ambulatory Visit: Payer: Medicaid Other | Admitting: Family Medicine

## 2015-08-20 ENCOUNTER — Ambulatory Visit (INDEPENDENT_AMBULATORY_CARE_PROVIDER_SITE_OTHER): Payer: Medicaid Other | Admitting: Family Medicine

## 2015-08-20 ENCOUNTER — Encounter: Payer: Self-pay | Admitting: Family Medicine

## 2015-08-20 ENCOUNTER — Telehealth: Payer: Self-pay

## 2015-08-20 ENCOUNTER — Ambulatory Visit
Admission: RE | Admit: 2015-08-20 | Discharge: 2015-08-20 | Disposition: A | Discharge status: Home / Self Care | Payer: Medicaid Other | Source: Ambulatory Visit | Attending: Family Medicine | Admitting: Family Medicine

## 2015-08-20 VITALS — BP 105/77 | HR 97 | Temp 98.6°F | Wt 196.0 lb

## 2015-08-20 DIAGNOSIS — M542 Cervicalgia: Secondary | ICD-10-CM | POA: Insufficient documentation

## 2015-08-20 DIAGNOSIS — J3501 Chronic tonsillitis: Secondary | ICD-10-CM

## 2015-08-20 DIAGNOSIS — R1310 Dysphagia, unspecified: Secondary | ICD-10-CM | POA: Diagnosis present

## 2015-08-20 LAB — CBC WITH DIFFERENTIAL/PLATELET
Hematocrit: 40.5 % (ref 34.0–46.6)
Hemoglobin: 14 g/dL (ref 11.1–15.9)
Lymphocytes Absolute: 2.3 10*3/uL (ref 0.7–3.1)
Lymphs: 26 %
MCH: 32.1 pg (ref 26.6–33.0)
MCHC: 34.6 g/dL (ref 31.5–35.7)
MCV: 93 fL (ref 79–97)
MID (Absolute): 0.6 10*3/uL (ref 0.1–1.6)
MID: 7 %
Neutrophils Absolute: 5.8 10*3/uL (ref 1.4–7.0)
Neutrophils: 67 %
Platelets: 270 10*3/uL (ref 150–379)
RBC: 4.36 x10E6/uL (ref 3.77–5.28)
RDW: 12.3 % (ref 12.3–15.4)
WBC: 8.7 10*3/uL (ref 3.4–10.8)

## 2015-08-20 MED ORDER — IOHEXOL 300 MG/ML  SOLN
75.0000 mL | Freq: Once | INTRAMUSCULAR | Status: AC | PRN
  Administered 2015-08-20: 75 mL via INTRAVENOUS

## 2015-08-20 NOTE — Patient Instructions (Signed)
We'll get the CT scan today; be available to go over the results when they call me (okay to use cell phone, make sure we have correct number) See Ear Nose Throat doctor right away GO to the hospital if you develop trouble breathing, trouble swallowing, develop high fever, neck stiffness

## 2015-08-20 NOTE — Telephone Encounter (Signed)
[facility] Called to confirm we got her CT results, we did. They are in her chart.

## 2015-08-20 NOTE — Progress Notes (Signed)
BP 105/77 mmHg  Pulse 97  Temp(Src) 98.6 F (37 C)  Wt 196 lb (88.905 kg)  SpO2 98%  LMP 10/30/2014   Subjective:    Patient ID: Renee Benjamin, female    DOB: [birth year], 34 y.o.   MRN: [id number]  HPI: Renee Benjamin is a 34 y.o. female  Chief Complaint  Patient presents with  . Sore Throat    x 1 month, feels like something is on her tonsil. Just on the right side.   She was seen here a few weeks ago; Dr. Jeananne Rama told her she had sinusitis; it hurts when she swallows and he said it was drainage; she woke up on Saturday and her throat was swollen, and they gave her cephalosporin; it started to get better; her voice goes; she has so much pain when she swallows; right side is swollen; went to urgent care 9 days ago; finished antibiotics; strains to breathe, strains to talk; 99.2 degree temperature; she has been having sweats and feels freezing; wakes up warm but no night sweats; no significant weight loss; she had a tonsil stone the last time she was here and he got it off; the right tonsil is painful; other than antibiotics, she has tried lemon tea, honey, gargling with warm water  Mother had to have her tonsils and adenoids out in her 82s; mother has had bad sinuses and she had sinuplasty  Relevant past medical, surgical, family and social history reviewed and updated as indicated. Interim medical history since our last visit reviewed. Allergies and medications reviewed and updated.  Last note reviewed, 08/01/15; she weighed 204 pounds in July, down to 195 that visit (9 pounds); large concretion removed at that visit with instrumentation  Review of Systems Per HPI unless specifically indicated above     Objective:    BP 105/77 mmHg  Pulse 97  Temp(Src) 98.6 F (37 C)  Wt 196 lb (88.905 kg)  SpO2 98%  LMP 10/30/2014  Wt Readings from Last 3 Encounters:  08/20/15 196 lb (88.905 kg)  08/01/15 195 lb 12.8 oz (88.814 kg)  07/13/15 199 lb (90.266 kg)    Physical  Exam  Constitutional: She appears well-developed and well-nourished. No distress.  HENT:  Head: Normocephalic and atraumatic.  Right Ear: Hearing, tympanic membrane, external ear and ear canal normal.  Left Ear: Hearing, tympanic membrane, external ear and ear canal normal.  Nose: Rhinorrhea present.  Mouth/Throat: Uvula is midline. Mucous membranes are not dry. No oral lesions. Posterior oropharyngeal edema and posterior oropharyngeal erythema present. No oropharyngeal exudate or tonsillar abscesses.  Eyes: EOM are normal. No scleral icterus.  Neck: No thyromegaly present.  Cardiovascular: Normal rate, regular rhythm and normal heart sounds.   No murmur heard. Pulmonary/Chest: Effort normal and breath sounds normal. No respiratory distress. She has no wheezes.  Abdominal: Soft. Bowel sounds are normal. She exhibits no distension.  Musculoskeletal: Normal range of motion. She exhibits no edema.  Lymphadenopathy:       Head (right side): No submental, no submandibular, no preauricular and no posterior auricular adenopathy present.       Head (left side): No submental, no submandibular, no preauricular and no posterior auricular adenopathy present.    She has cervical adenopathy.       Right cervical: Superficial cervical and deep cervical adenopathy present. No posterior cervical adenopathy present.      Left cervical: No superficial cervical, no deep cervical and no posterior cervical adenopathy present.  Right: No supraclavicular adenopathy present.       Left: No supraclavicular adenopathy present.  Neurological: She is alert. She exhibits normal muscle tone.  Skin: Skin is warm and dry. She is not diaphoretic. No pallor.  Psychiatric: She has a normal mood and affect. Her behavior is normal. Judgment and thought content normal.    Results for orders placed or performed in visit on 08/20/15  Strep Gp A Ag, IA W/Reflex  Result Value Ref Range   Strep A Culture Negative   CBC  With Differential/Platelet  Result Value Ref Range   WBC 8.7 3.4 - 10.8 x10E3/uL   RBC 4.36 3.77 - 5.28 x10E6/uL   Hemoglobin 14.0 11.1 - 15.9 g/dL   Hematocrit 40.5 34.0 - 46.6 %   MCV 93 79 - 97 fL   MCH 32.1 26.6 - 33.0 pg   MCHC 34.6 31.5 - 35.7 g/dL   RDW 12.3 12.3 - 15.4 %   Platelets 270 150 - 379 x10E3/uL   Neutrophils 67 %   Lymphs 26 %   MID 7 %   Neutrophils Absolute 5.8 1.4 - 7.0 x10E3/uL   Lymphocytes Absolute 2.3 0.7 - 3.1 x10E3/uL   MID (Absolute) 0.6 0.1 - 1.6 X10E3/uL      Assessment & Plan:   Problem List Items Addressed This Visit      Respiratory   Chronic tonsillitis - Primary   Relevant Orders   CBC With Differential   Strep Gp A Ag, IA W/Reflex (Completed)   Ambulatory referral to ENT   CT Soft Tissue Neck W Contrast (Completed)     Other   Neck pain on right side    She has been on two rounds of antibiotics; rapid strep today negative; throat culture pending; concern for deeper tissue mass or abscess given her level of pain and failure of antibiotics; will get neck CT and refer to ENT      Relevant Orders   CBC With Differential   Strep Gp A Ag, IA W/Reflex (Completed)   Ambulatory referral to ENT   CT Soft Tissue Neck W Contrast (Completed)      Follow up plan: Return in about 3 days (around 08/23/2015) for recheck.  An after-visit summary was printed and given to the patient at [facility].  Please see the patient instructions which may contain other information and recommendations beyond what is mentioned above in the assessment and plan. Orders Placed This Encounter  Procedures  . Strep Gp A Ag, IA W/Reflex  . CT Soft Tissue Neck W Contrast  . CBC With Differential  . CBC With Differential/Platelet  . Ambulatory referral to ENT

## 2015-08-20 NOTE — Telephone Encounter (Signed)
Yes, thank you The reason for exam should have been "neck" pain, not back pain

## 2015-08-22 LAB — STREP GP A AG, IA W/REFLEX: Strep A Culture: NEGATIVE

## 2015-08-23 ENCOUNTER — Ambulatory Visit: Payer: Medicaid Other | Admitting: Family Medicine

## 2015-08-25 NOTE — Assessment & Plan Note (Signed)
She has been on two rounds of antibiotics; rapid strep today negative; throat culture pending; concern for deeper tissue mass or abscess given her level of pain and failure of antibiotics; will get neck CT and refer to ENT

## 2015-10-02 ENCOUNTER — Encounter: Payer: Self-pay | Admitting: Family Medicine

## 2015-10-02 ENCOUNTER — Ambulatory Visit (INDEPENDENT_AMBULATORY_CARE_PROVIDER_SITE_OTHER): Payer: Medicaid Other | Admitting: Family Medicine

## 2015-10-02 VITALS — BP 122/81 | HR 80 | Temp 97.8°F | Wt 200.0 lb

## 2015-10-02 DIAGNOSIS — M791 Myalgia, unspecified site: Secondary | ICD-10-CM | POA: Insufficient documentation

## 2015-10-02 DIAGNOSIS — R51 Headache: Secondary | ICD-10-CM | POA: Diagnosis not present

## 2015-10-02 DIAGNOSIS — R519 Headache, unspecified: Secondary | ICD-10-CM

## 2015-10-02 DIAGNOSIS — M2550 Pain in unspecified joint: Secondary | ICD-10-CM | POA: Diagnosis not present

## 2015-10-02 DIAGNOSIS — M546 Pain in thoracic spine: Secondary | ICD-10-CM | POA: Insufficient documentation

## 2015-10-02 DIAGNOSIS — M545 Low back pain, unspecified: Secondary | ICD-10-CM

## 2015-10-02 LAB — CBC WITH DIFFERENTIAL/PLATELET
Hematocrit: 37.6 % (ref 34.0–46.6)
Hemoglobin: 13.6 g/dL (ref 11.1–15.9)
Lymphocytes Absolute: 2.5 10*3/uL (ref 0.7–3.1)
Lymphs: 32 %
MCH: 32.8 pg (ref 26.6–33.0)
MCHC: 36.2 g/dL — ABNORMAL HIGH (ref 31.5–35.7)
MCV: 91 fL (ref 79–97)
MID (Absolute): 0.5 10*3/uL (ref 0.1–1.6)
MID: 7 %
Neutrophils Absolute: 4.9 10*3/uL (ref 1.4–7.0)
Neutrophils: 61 %
Platelets: 259 10*3/uL (ref 150–379)
RBC: 4.15 x10E6/uL (ref 3.77–5.28)
RDW: 12.3 % (ref 12.3–15.4)
WBC: 7.9 10*3/uL (ref 3.4–10.8)

## 2015-10-02 MED ORDER — CELECOXIB 200 MG PO CAPS: 200.0000 mg | ORAL_CAPSULE | Freq: Every day | ORAL | Status: DC

## 2015-10-02 NOTE — Assessment & Plan Note (Signed)
Not typical for migraine, but no focal neurologic findings; will get labs today; reasons to go to ER reviewed; start celebrex (she does not tolerate aleve without getting stomach upset); not sure if lreated to levaquin, but getting labs to further evaluate

## 2015-10-02 NOTE — Assessment & Plan Note (Signed)
Check labs today; may be related to fluoroquinolone treatment recently; treat with celebrex and tylenol

## 2015-10-02 NOTE — Assessment & Plan Note (Signed)
After treatment with levaquin; return to see our D.O. for evaluation, possible OMM; start celebrex; may use tylenol with this per package directions

## 2015-10-02 NOTE — Patient Instructions (Addendum)
Out of work today Let's get labs If you get worse, please go to the emergency room -- this would include fever, neurologic symptoms like facial weakness, numbness, trouble swallowing or speaking, neck stiffness, weakness or tingling of arms or legs, worsening headache, rash on the wrists or ankles, etc. Start Celebrex (do not take with any other anti-inflammatories) Okay to use tylenol with this, but always use per package directions Return to see Dr. Wynetta Emery in the next few days for evaluation to see if she can treat your discomfort

## 2015-10-02 NOTE — Progress Notes (Signed)
BP 122/81 mmHg  Pulse 80  Temp(Src) 97.8 F (36.6 C)  Wt 200 lb (90.719 kg)  SpO2 97%  LMP 10/30/2014   Subjective:    Patient ID: Renee Benjamin, female    DOB: Apr 25, 1981, 34 y.o.   MRN: [id number]  HPI: Renee Benjamin is a 34 y.o. female  Chief Complaint  Patient presents with  . Migraine    since Wed night.  . Joint pain    Over all her body, she states it gets better if she's moving but it's worse at night.    She says this started on Tuesday night; she took her Imitrex; keeps zofran or phenergan on hand beecause she uses that Has been fighting it with imitrex since Wed Had really bad stomach pains for two weeks Muscle pains and joint aches Feels like her legs are tearing when she stands up in the morning She went to ENT and they put her on Levaquin; by day 4 she was really hurting, but had to finish it because she was so sick; ever since then, her muscles just hurt; joints hurt She does not know if the migraine is from muscle inflammation, not in the eyes like usual; light bothers her a little bit; not a typical migraine The imitrex does relieve the migraine for about 4 hours; it hits her and comes right back No facial weakness or numbness, no trouble swallowing or speaking; legs are weak, but just because they hurt so bad; shoulders hurt, hips hurt, ankles hurt; feels like she has been beaten with a baseball bat; having stiffness too Deep pain and ache; first thought bone pain, when she stands in the morning, feels like ripping in her joints and muscles; takes a hot shower and gets moving and then it gets better The pain in her abdomen is right along her C-section scar  She asked if stress can be realted; her step-mother "stole" her father's ashes; he died 9 years ago on Feb 28, 2023; she cannot talk to her  Relevant past medical, surgical, family and social history reviewed and updated as indicated. They are testing her mother for lupus Interim medical history  since our last visit reviewed. Allergies and medications reviewed and updated.  Review of Systems  Constitutional: Negative for fever.  HENT: Negative for trouble swallowing.   Eyes: Positive for photophobia (just a little bit). Negative for visual disturbance.  Musculoskeletal: Positive for myalgias and arthralgias.  Skin: Negative for rash.  Neurological: Positive for headaches. Negative for facial asymmetry, speech difficulty, weakness and numbness.  Per HPI unless specifically indicated above     Objective:    BP 122/81 mmHg  Pulse 80  Temp(Src) 97.8 F (36.6 C)  Wt 200 lb (90.719 kg)  SpO2 97%  LMP 10/30/2014  Wt Readings from Last 3 Encounters:  10/02/15 200 lb (90.719 kg)  08/20/15 196 lb (88.905 kg)  08/01/15 195 lb 12.8 oz (88.814 kg)    Physical Exam  Constitutional: She appears well-developed and well-nourished.  Non-toxic appearance. She does not have a sickly appearance. She does not appear ill. No distress.  HENT:  Head: Normocephalic and atraumatic.  Right Ear: Hearing, tympanic membrane, external ear and ear canal normal.  Left Ear: Hearing, tympanic membrane, external ear and ear canal normal.  Nose: No mucosal edema or rhinorrhea.  Mouth/Throat: Oropharynx is clear and moist and mucous membranes are normal.  Eyes: Conjunctivae and EOM are normal. Pupils are equal, round, and reactive to light. Right eye exhibits  no discharge. Left eye exhibits no discharge. No scleral icterus.  Neck: Normal range of motion. Neck supple.  Cardiovascular: Normal rate and regular rhythm.   Pulmonary/Chest: Effort normal and breath sounds normal.  Abdominal: Soft. Bowel sounds are normal. She exhibits no distension. There is no tenderness. There is no guarding.  Musculoskeletal:       Thoracic back: She exhibits tenderness (left of midline).       Lumbar back: She exhibits tenderness (left of midline).  Neurological: She is alert. She has normal strength. She displays no  atrophy, no tremor and normal reflexes. No cranial nerve deficit. She exhibits normal muscle tone. Gait normal.  Reflex Scores:      Patellar reflexes are 2+ on the right side and 2+ on the left side. Skin: Skin is warm. No ecchymosis and no rash noted.  Psychiatric: She has a normal mood and affect. Her behavior is normal. Judgment and thought content normal.  Cooperative, though not feeling well   Results for orders placed or performed in visit on 08/20/15  Strep Gp A Ag, IA W/Reflex  Result Value Ref Range   Strep A Culture Negative   CBC With Differential/Platelet  Result Value Ref Range   WBC 8.7 3.4 - 10.8 x10E3/uL   RBC 4.36 3.77 - 5.28 x10E6/uL   Hemoglobin 14.0 11.1 - 15.9 g/dL   Hematocrit 40.5 34.0 - 46.6 %   MCV 93 79 - 97 fL   MCH 32.1 26.6 - 33.0 pg   MCHC 34.6 31.5 - 35.7 g/dL   RDW 12.3 12.3 - 15.4 %   Platelets 270 150 - 379 x10E3/uL   Neutrophils 67 %   Lymphs 26 %   MID 7 %   Neutrophils Absolute 5.8 1.4 - 7.0 x10E3/uL   Lymphocytes Absolute 2.3 0.7 - 3.1 x10E3/uL   MID (Absolute) 0.6 0.1 - 1.6 X10E3/uL      Assessment & Plan:   Problem List Items Addressed This Visit      Other   Headache - Primary    Not typical for migraine, but no focal neurologic findings; will get labs today; reasons to go to ER reviewed; start celebrex (she does not tolerate aleve without getting stomach upset); not sure if lreated to levaquin, but getting labs to further evaluate      Relevant Medications   celecoxib (CELEBREX) 200 MG capsule   Other Relevant Orders   Rocky mtn spotted fvr ab, IgG-blood   Rocky mtn spotted fvr ab, IgM-blood   B. burgdorfi antibodies   Thoracic back pain    After treatment with levaquin; return to see our D.O. for evaluation, possible OMM; start celebrex; may use tylenol with this per package directions      Relevant Medications   celecoxib (CELEBREX) 200 MG capsule   Lumbar pain    After treatment with levaquin; return to see our D.O. for  evaluation, possible OMM; start celebrex; may use tylenol with this per package directions      Relevant Medications   celecoxib (CELEBREX) 200 MG capsule   Myalgia    Check labs today; return to see our DO for evaluation      Relevant Orders   CBC With Differential/Platelet   C-reactive protein   ANA w/Reflex if Positive   Rocky mtn spotted fvr ab, IgG-blood   Rocky mtn spotted fvr ab, IgM-blood   B. burgdorfi antibodies   Arthralgia    Check labs today; may be related to fluoroquinolone treatment recently;  treat with celebrex and tylenol      Relevant Orders   CBC With Differential/Platelet   C-reactive protein   ANA w/Reflex if Positive   Rocky mtn spotted fvr ab, IgG-blood   Rocky mtn spotted fvr ab, IgM-blood   B. burgdorfi antibodies       Follow up plan: Return in about 3 days (around 10/05/2015) for Dr. Wynetta Emery for evaluation.  An after-visit summary was printed and given to the patient at [place].  Please see the patient instructions which may contain other information and recommendations beyond what is mentioned above in the assessment and plan.  Orders Placed This Encounter  Procedures  . CBC With Differential/Platelet  . C-reactive protein  . ANA w/Reflex if Positive  . Rocky mtn spotted fvr ab, IgG-blood  . Rocky mtn spotted fvr ab, IgM-blood  . B. burgdorfi antibodies   Meds ordered this encounter  Medications  . celecoxib (CELEBREX) 200 MG capsule    Sig: Take 1 capsule (200 mg total) by mouth daily. With food; pharmacist -- failed aleve, gets stomach upset    Dispense:  30 capsule    Refill:  0

## 2015-10-02 NOTE — Assessment & Plan Note (Signed)
Check labs today; return to see our DO for evaluation

## 2015-10-03 LAB — C-REACTIVE PROTEIN: CRP: 5 mg/L — ABNORMAL HIGH (ref 0.0–4.9)

## 2015-10-03 LAB — ANA W/REFLEX IF POSITIVE: Anti Nuclear Antibody(ANA): NEGATIVE

## 2015-10-03 LAB — B. BURGDORFI ANTIBODIES: Lyme IgG/IgM Ab: <0.91 {ISR} (ref 0.00–0.90)

## 2015-10-04 LAB — ROCKY MTN SPOTTED FVR AB, IGG-BLOOD: RMSF IgG: NEGATIVE

## 2015-10-04 LAB — ROCKY MTN SPOTTED FVR AB, IGM-BLOOD: RMSF IgM: 0.14 index (ref 0.00–0.89)

## 2015-10-05 ENCOUNTER — Telehealth: Payer: Self-pay | Admitting: Family Medicine

## 2015-10-05 NOTE — Telephone Encounter (Signed)
I talked to patient They never approved her celebrex, so she is taking ibuprofen; 800 mg in the AM and 800 afternoon, and 800 mg night I reviewed labs with her She still hurts, so if not getting better, call me and we'll refer to rheum

## 2015-10-08 ENCOUNTER — Encounter: Payer: Self-pay | Admitting: Family Medicine

## 2015-10-08 ENCOUNTER — Ambulatory Visit (INDEPENDENT_AMBULATORY_CARE_PROVIDER_SITE_OTHER): Payer: Medicaid Other | Admitting: Family Medicine

## 2015-10-08 VITALS — BP 105/71 | HR 72 | Temp 98.8°F | Wt 201.0 lb

## 2015-10-08 DIAGNOSIS — M436 Torticollis: Secondary | ICD-10-CM | POA: Diagnosis not present

## 2015-10-08 DIAGNOSIS — M9900 Segmental and somatic dysfunction of head region: Secondary | ICD-10-CM | POA: Diagnosis not present

## 2015-10-08 DIAGNOSIS — M9902 Segmental and somatic dysfunction of thoracic region: Secondary | ICD-10-CM

## 2015-10-08 DIAGNOSIS — M9901 Segmental and somatic dysfunction of cervical region: Secondary | ICD-10-CM

## 2015-10-08 DIAGNOSIS — M9907 Segmental and somatic dysfunction of upper extremity: Secondary | ICD-10-CM

## 2015-10-08 DIAGNOSIS — M9908 Segmental and somatic dysfunction of rib cage: Secondary | ICD-10-CM | POA: Diagnosis not present

## 2015-10-08 MED ORDER — METHOCARBAMOL 500 MG PO TABS: ORAL_TABLET | ORAL | Status: DC

## 2015-10-08 NOTE — Patient Instructions (Addendum)
MEDICATION   If pain medication is necessary, then nonsteroidal anti-inflammatory medications, such as aspirin and ibuprofen, or other minor pain relievers, such as acetaminophen, are often recommended.  Do not take pain medication for 7 days before surgery.  Prescription pain relievers may be given if deemed necessary by your caregiver. Use only as directed and only as much as you need. HEAT AND COLD  Cold treatment (icing) relieves pain and reduces inflammation. Cold treatment should be applied for 10 to 15 minutes every 2 to 3 hours for inflammation and pain and immediately after any activity that aggravates your symptoms. Use ice packs or massage the area with a piece of ice (ice massage).  Heat treatment may be used prior to performing the stretching and strengthening activities prescribed by your caregiver, physical therapist, or athletic trainer. Use a heat pack or soak your injury in warm water. SEEK MEDICAL CARE IF:  Treatment seems to offer no benefit, or the condition worsens.  Any medications produce adverse side effects. EXERCISES RANGE OF MOTION (ROM) AND STRETCHING EXERCISES -  These exercises may help you when beginning to rehabilitate your injury. Your symptoms may resolve with or without further involvement from your physician, physical therapist or athletic trainer. While completing these exercises, remember:   Restoring tissue flexibility helps normal motion to return to the joints. This allows healthier, less painful movement and activity.  An effective stretch should be held for at least 30 seconds.  A stretch should never be painful. You should only feel a gentle lengthening or release in the stretched tissue. STRETCH - Flexion, Standing  Stand with good posture. With an underhand grip on your right / left and an overhand grip on the opposite hand, grasp a broomstick or cane so that your hands are a little more than shoulder-width apart.  Keeping your right / left  elbow straight and shoulder muscles relaxed, push the stick with your opposite hand to raise your right / left arm in front of your body and then overhead. Raise your arm until you feel a stretch in your right / left shoulder, but before you have increased shoulder pain.  Try to avoid shrugging your right / left shoulder as your arm rises by keeping your shoulder blade tucked down and toward your mid-back spine. Hold __________ seconds.  Slowly return to the starting position. Repeat __________ times. Complete this exercise __________ times per day.  STRETCH - Abduction, Supine  Stand with good posture. With an underhand grip on your right / left and an overhand grip on the opposite hand, grasp a broomstick or cane so that your hands are a little more than shoulder-width apart.  Keeping your right / left elbow straight and shoulder muscles relaxed, push the stick with your opposite hand to raise your right / left arm out to the side of your body and then overhead. Raise your arm until you feel a stretch in your right / left shoulder, but before you have increased shoulder pain.  Try to avoid shrugging your right / left shoulder as your arm rises by keeping your shoulder blade tucked down and toward your mid-back spine. Hold __________ seconds.  Slowly return to the starting position. Repeat __________ times. Complete this exercise __________ times per day.  ROM - Flexion, Active-Assisted  Lie on your back. You may bend your knees for comfort.  Grasp a broomstick or cane so your hands are about shoulder-width apart. Your right / left hand should grip the end of the  stick/cane so that your hand is positioned "thumbs-up," as if you were about to shake hands.  Using your healthy arm to lead, raise your right / left arm overhead until you feel a gentle stretch in your shoulder. Hold __________ seconds.  Use the stick/cane to assist in returning your right / left arm to its starting  position. Repeat __________ times. Complete this exercise __________ times per day.  STRETCH - External Rotation and Abduction  Stagger your stance through a doorframe. It does not matter which foot is forward.  Choose one of the following positions as instructed by your physician, physical therapist or athletic trainer: place your hands:  and forearms above your head and on the door frame.  and forearms at head-height and on the door frame.  at elbow-height and on the door frame.  Keeping your head and chest upright and your stomach muscles tight to prevent over-extending your low-back, slowly shift your weight onto your front foot until you feel a stretch across your chest and/or in the front of your shoulders.  Hold __________ seconds. Shift your weight to your back foot to release the stretch. Repeat __________ times. Complete this stretch __________ times per day.  STRENGTHENING EXERCISES - Trapezius Palsy (Spinal Accessory Nerve Palsy) These exercises may help you when beginning to rehabilitate your injury. They may resolve your symptoms with or without further involvement from your physician, physical therapist or athletic trainer. While completing these exercises, remember:   Muscles can gain both the endurance and the strength needed for everyday activities through controlled exercises.  Complete these exercises as instructed by your physician, physical therapist or athletic trainer. Progress with the resistance and repetition exercises only as your caregiver advises.  You may experience muscle soreness or fatigue, but the pain or discomfort you are trying to eliminate should never worsen during these exercises. If this pain does worsen, stop and make certain you are following the directions exactly. If the pain is still present after adjustments, discontinue the exercise until you can discuss the trouble with your clinician. STRENGTH - Scapular Depression and Adduction  With  good posture, sit on a firm chair. Support your arms in front of you with pillows, arm rests or a table top. Have your elbows in line with the sides of your body.  Gently draw your shoulder blades down and toward your mid-back spine. Gradually increase the tension without tensing the muscles along the top of your shoulders and the back of your neck.  Hold for __________ seconds. Slowly release the tension and relax your muscles completely before completing the next repetition.  After you have practiced this exercise, remove the arm support and complete it while standing as well as sitting. Repeat __________ times. Complete this exercise __________ times per day.  STRENGTH - Shoulder Abductors, Isometric   With good posture, stand or sit about 4-6 inches from a wall with your right / left side facing the wall.  Bend your right / left elbow. Gently press your right / left elbow into the wall. Increase the pressure gradually until you are pressing as hard as you can without shrugging your shoulder or increasing any shoulder discomfort.  Hold __________ seconds.  Release the tension slowly. Relax your shoulder muscles completely before you do the next repetition. Repeat __________ times. Complete this exercise __________ times per day.  STRENGTH - Shoulder Flexion, Isometric  With good posture and facing a wall, stand or sit about 4-6 inches away.  Keeping your right /  left elbow straight, gently press the top of your fist into the wall. Increase the pressure gradually until you are pressing as hard as you can without shrugging your shoulder or increasing any shoulder discomfort.  Hold __________ seconds.  Release the tension slowly. Relax your shoulder muscles completely before you do the next repetition. Repeat __________ times. Complete this exercise __________ times per day.  STRENGTH - Internal Rotators  Secure a rubber exercise band/tubing to a fixed object so that it is at the same  height as your right / left elbow when you are standing or sitting on a firm surface.  Stand or sit so that the secured exercise band/tubing is at your right / left side.  Bend your elbow 90 degrees. Place a folded towel or small pillow under your right / left arm so that your elbow is a few inches away from your side.  Keeping the tension on the exercise band/tubing, pull it across your body toward your abdomen. Be sure to keep your body steady so that the movement is only coming from your shoulder rotating.  Hold __________ seconds. Release the tension in a controlled manner as you return to the starting position. Repeat __________ times. Complete this exercise __________ times per day.  STRENGTH - External Rotators  Secure a rubber exercise band/tubing to a fixed object so that it is at the same height as your right / left elbow when you are standing or sitting on a firm surface.  Stand or sit so that the secured exercise band/tubing is at your side that is not injured.  Bend your elbow 90 degrees. Place a folded towel or small pillow under your right / left arm so that your elbow is a few inches away from your side.  Keeping the tension on the exercise band/tubing, pull it away from your body, as if pivoting on your elbow. Be sure to keep your body steady so that the movement is only coming from your shoulder rotating.  Hold __________ seconds. Release the tension in a controlled manner as you return to the starting position. Repeat __________ times. Complete this exercise __________ times per day.  STRENGTH - Shoulder Extensors  Secure a rubber exercise band/tubing so that it is at the height of your shoulders when you are either standing or sitting on a firm arm-less chair.  With a thumbs-up grip, grasp an end of the band/tubing in each hand. Straighten your elbows and lift your hands straight in front of you at shoulder height. Step back away from the secured end of band/tubing until  it becomes tense.  Squeezing your shoulder blades together, pull your hands down to the sides of your thighs. Do not allow your hands to go behind you.  Hold for __________ seconds. Slowly ease the tension on the band/tubing as you reverse the directions and return to the starting position. Repeat __________ times. Complete this exercise __________ times per day.  STRENGTH - Shoulder Extensors, Prone  Lie on your stomach on a firm surface so that your right / left arm overhangs the edge. Rest your forehead on your opposite forearm. With your thumb facing away from your body and your elbow straight, hold a __________ weight in your hand.  Squeeze your right / left shoulder blade to your mid-back spine and then slowly raise your arm behind you to the height of the bed.  Hold for __________ seconds. Slowly reverse the directions and return to the starting position, controlling the weight as you  lower your arm. Repeat __________ times. Complete this exercise __________ times per day.  STRENGTH - Horizontal Abductors Choose one of the two oppositions to complete this exercise. Prone (lying on stomach):  Lie on your stomach on a firm surface so that your right / left arm overhangs the edge. Rest your forehead on your opposite forearm. With your palm facing the floor and your elbow straight, hold a __________ weight in your hand.  Squeeze your right / left shoulder blade to your mid-back spine and then slowly raise your arm to the height of the bed.  Hold for __________ seconds. Slowly reverse the directions and return to the starting position, controlling the weight as you lower your arm. Repeat __________ times. Complete this exercise __________ times per day. Standing:   Secure a rubber exercise band/tubing so that it is at the height of your shoulders when you are either standing or sitting on a firm arm-less chair.  Grasp an end of the band/tubing in each hand and have your palms face each  other. Straighten your elbows and lift your hands straight in front of you at shoulder height. Step back away from the secured end of band/tubing until it becomes tense.  Squeeze your shoulder blades together. Keeping your elbows locked and your hands at shoulder-height, bring your hands out to your side.  Hold __________ seconds. Slowly ease the tension on the band/tubing as you reverse the directions and return to the starting position. Repeat __________ times. Complete this exercise __________ times per day. STRENGTH - Scapular Retractors and Elevators  Secure a rubber exercise band/tubing so that it is at the height of your shoulders when you are either standing or sitting on a firm arm-less chair.  With a thumbs-up grip, grasp an end of the band/tubing in each hand. Step back away from the secured end of band/tubing until it becomes tense.  Squeezing your shoulder blades together, straighten your elbows and lift your hands straight over your head.  Hold for __________ seconds. Slowly ease the tension on the band/tubing as you reverse the directions and return to the starting position. Repeat __________ times. Complete this exercise __________ times per day.    This information is not intended to replace advice given to you by your health care provider. Make sure you discuss any questions you have with your health care provider.   Document Released: 10/20/2005 Document Revised: 03/06/2015 Document Reviewed: 02/01/2009 [employer] Interactive Patient Education [employer].
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
## 2015-10-08 NOTE — Assessment & Plan Note (Signed)
Will give exercises, heat. Has tolerated methocarbamol in the past. She does have some somatic dysfunctions that I think are contributing to her symptoms and I think she would benefit from OMT. Treated today with good results as discussed below

## 2015-10-12 ENCOUNTER — Encounter: Payer: Self-pay | Admitting: Family Medicine

## 2015-10-12 ENCOUNTER — Ambulatory Visit (INDEPENDENT_AMBULATORY_CARE_PROVIDER_SITE_OTHER): Payer: Medicaid Other | Admitting: Family Medicine

## 2015-10-12 VITALS — BP 104/70 | HR 65 | Temp 98.0°F | Ht 68.0 in | Wt 201.6 lb

## 2015-10-12 DIAGNOSIS — M436 Torticollis: Secondary | ICD-10-CM

## 2015-10-12 DIAGNOSIS — M545 Low back pain, unspecified: Secondary | ICD-10-CM

## 2015-10-12 DIAGNOSIS — M9909 Segmental and somatic dysfunction of abdomen and other regions: Secondary | ICD-10-CM

## 2015-10-12 DIAGNOSIS — M9903 Segmental and somatic dysfunction of lumbar region: Secondary | ICD-10-CM | POA: Diagnosis not present

## 2015-10-12 DIAGNOSIS — M9904 Segmental and somatic dysfunction of sacral region: Secondary | ICD-10-CM

## 2015-10-12 DIAGNOSIS — M9906 Segmental and somatic dysfunction of lower extremity: Secondary | ICD-10-CM | POA: Diagnosis not present

## 2015-10-12 DIAGNOSIS — M9908 Segmental and somatic dysfunction of rib cage: Secondary | ICD-10-CM

## 2015-10-12 DIAGNOSIS — M9902 Segmental and somatic dysfunction of thoracic region: Secondary | ICD-10-CM

## 2015-10-12 DIAGNOSIS — M9901 Segmental and somatic dysfunction of cervical region: Secondary | ICD-10-CM | POA: Diagnosis not present

## 2015-10-12 DIAGNOSIS — M9905 Segmental and somatic dysfunction of pelvic region: Secondary | ICD-10-CM

## 2015-10-12 NOTE — Progress Notes (Signed)
BP 104/70 mmHg  Pulse 65  Temp(Src) 98 F (36.7 C)  Ht 5\' 8"  (1.727 m)  Wt 201 lb 9.6 oz (91.445 kg)  BMI 30.66 kg/m2  SpO2 97%  LMP 10/30/2014   Subjective:    Patient ID: Renee Benjamin, female    DOB: 08-19-1981, 34 y.o.   MRN: [id number]  HPI: Renee Benjamin is a 34 y.o. female  Chief Complaint  Patient presents with  . Neck Pain    OMM   Renee Benjamin presents today for evaluation and possible treatment with OMT for neck pain. She was seen 4 days ago for initial consult for torticollis and had a focused treatment only on her neck. She states that she did very well following her last appointment and has been feeling much better. She notes that her low back has been really acting up the past couple of days and making her feel really uncomfortable. It starts in her R low back and goes into her buttocks and hips and into her pelvis. She denies any numbness or tingling. It's deep and aching with sharp pains every once in a while. She has otherwise been feeling well with no other concerns or complaints at this time.   Relevant past medical, surgical, family and social history reviewed and updated as indicated. Interim medical history since our last visit reviewed. Allergies and medications reviewed and updated.  Review of Systems  Constitutional: Negative.   Respiratory: Negative.   Cardiovascular: Negative.  Negative for chest pain, palpitations and leg swelling.  Gastrointestinal: Negative.   Musculoskeletal: Positive for myalgias, back pain, neck pain and neck stiffness. Negative for joint swelling, arthralgias and gait problem.  Psychiatric/Behavioral: Negative.     Per HPI unless specifically indicated above     Objective:    BP 104/70 mmHg  Pulse 65  Temp(Src) 98 F (36.7 C)  Ht 5\' 8"  (1.727 m)  Wt 201 lb 9.6 oz (91.445 kg)  BMI 30.66 kg/m2  SpO2 97%  LMP 10/30/2014  Wt Readings from Last 3 Encounters:  10/12/15 201 lb 9.6 oz (91.445 kg)  10/08/15 201 lb  (91.173 kg)  10/02/15 200 lb (90.719 kg)    Physical Exam  Constitutional: She is oriented to person, place, and time. She appears well-developed and well-nourished. No distress.  HENT:  Head: Normocephalic and atraumatic.  Right Ear: Hearing normal.  Left Ear: Hearing normal.  Nose: Nose normal.  Eyes: Conjunctivae and lids are normal. Right eye exhibits no discharge. Left eye exhibits no discharge. No scleral icterus.  Pulmonary/Chest: Effort normal. No respiratory distress.  Abdominal: Soft. She exhibits no distension and no mass. There is no tenderness. There is no rebound and no guarding.  Neurological: She is alert and oriented to person, place, and time.  Skin: Skin is warm, dry and intact. No rash noted. No erythema. No pallor.  Psychiatric: She has a normal mood and affect. Her speech is normal and behavior is normal. Judgment and thought content normal. Cognition and memory are normal.  Nursing note and vitals reviewed. Musculoskeletal:  Exam found Decreased ROM, Tissue texture changes, Tenderness to palpation and Asymmetry of patient's  neck, thorax, ribs, lumbar, pelvis, sacrum, lower extremity and abdomen Osteopathic Structural Exam:   Neck: SCM hypertonic on the L  Thorax: T4-7SLRR, trap spasm on the R  Ribs: 1st rib up on the L, Ribs 5-6 locked up on the L  Lumbar: L4-5SRRL, QL hypertonic on the R  Pelvis: Posterior R innominate with outflare, pubic symphysis  inferior on the L  Sacrum: R on L torsion, SI joint restricted on the R  Lower Extremity: hamstring hypertonicity bilaterally, glut spasm on the R, psoas spasm on the R  Abdomen: fascial strain into R   Results for orders placed or performed in visit on 10/02/15  CBC With Differential/Platelet  Result Value Ref Range   WBC 7.9 3.4 - 10.8 x10E3/uL   RBC 4.15 3.77 - 5.28 x10E6/uL   Hemoglobin 13.6 11.1 - 15.9 g/dL   Hematocrit 37.6 34.0 - 46.6 %   MCV 91 79 - 97 fL   MCH 32.8 26.6 - 33.0 pg   MCHC 36.2 (H) 31.5  - 35.7 g/dL   RDW 12.3 12.3 - 15.4 %   Platelets 259 150 - 379 x10E3/uL   Neutrophils 61 %   Lymphs 32 %   MID 7 %   Neutrophils Absolute 4.9 1.4 - 7.0 x10E3/uL   Lymphocytes Absolute 2.5 0.7 - 3.1 x10E3/uL   MID (Absolute) 0.5 0.1 - 1.6 X10E3/uL  C-reactive protein  Result Value Ref Range   CRP 5.0 (H) 0.0 - 4.9 mg/L  ANA w/Reflex if Positive  Result Value Ref Range   Anit Nuclear Antibody(ANA) Negative Negative  Rocky mtn spotted fvr ab, IgG-blood  Result Value Ref Range   RMSF IgG Negative Negative  Rocky mtn spotted fvr ab, IgM-blood  Result Value Ref Range   RMSF IgM 0.14 0.00 - 0.89 index  B. burgdorfi antibodies  Result Value Ref Range   Lyme IgG/IgM Ab <0.91 0.00 - 0.90 ISR      Assessment & Plan:   Problem List Items Addressed This Visit      Musculoskeletal and Integument   Torticollis, acute    Given exercises, heat last visit. Has tolerated methocarbamol in the past. She does have some somatic dysfunctions that I think are contributing to her symptoms and I think she would benefit from OMT. Treated today with good results as discussed below.          Other   Lumbar pain - Primary    Seems to be myofascial and structural in nature. Possibly irritated by c-section and scar tissue with a lot of pulling into that area. Hamstrings very tight. Stretches given today. She does have some somatic dysfunctions that I think are contributing to her symptoms and I think she would benefit from OMT. Patient treated today with good results. Continue to monitor. See below.        Other Visit Diagnoses    Cervical (neck) region somatic dysfunction        Thoracic segment dysfunction        Rib cage region somatic dysfunction        Lumbar region somatic dysfunction        Somatic dysfunction of spine, sacral        Somatic dysfunction of pelvic region        Somatic dysfunction of lower extremity        Somatic dysfunction of abdominal region          After verbal  consent was obtained, patient was treated today with osteopathic manipulative medicine to the regions of the neck, thorax, ribs, lumbar, pelvis, sacrum, abdomen and lower extremity using the techniques of FPR, myofascial release, counterstrain, muscle energy, HVLA and soft tissue. Areas of compensation relating to her primary pain source also treated. Patient tolerated the procedure well with good objective and good subjective improvement in symptoms. She left the room  in good condition. She was advised to stay well hydrated and that she may have some soreness following the procedure. If not improving or worsening, she will call and come in. Home exercise program of stretches for hamstrings discussed and demonstrated today. Patient will do these stretches BID to before the point of pain, and will return for reevaluation  in 1-2 weeks.   Follow up plan: Return in about 1 week (around 10/19/2015).

## 2015-10-12 NOTE — Assessment & Plan Note (Signed)
Seems to be myofascial and structural in nature. Possibly irritated by c-section and scar tissue with a lot of pulling into that area. Hamstrings very tight. Stretches given today. She does have some somatic dysfunctions that I think are contributing to her symptoms and I think she would benefit from OMT. Patient treated today with good results. Continue to monitor. See below.

## 2015-10-12 NOTE — Patient Instructions (Signed)
Hamstring Strain With Rehab The hamstring muscle and tendons are vulnerable to muscle or tendon tear (strain). Hamstring tears cause pain and inflammation in the backside of the thigh, where the hamstring muscles are located. The hamstring is comprised of three muscles that are responsible for straightening the hip, bending the knee, and stabilizing the knee. These muscles are important for walking, running, and jumping. Hamstring strain is the most common injury of the thigh. Hamstring strains are classified as grade 1 or 2 strains. Grade 1 strains cause pain, but the tendon is not lengthened. Grade 2 strains include a lengthened ligament due to the ligament being stretched or partially ruptured. With grade 2 strains there is still function, although the function may be decreased.  SYMPTOMS   Pain, tenderness, swelling, warmth, or redness over the hamstring muscles, at the back of the thigh.  Pain that gets worse during and after intense activity.  A "pop" heard in the area, at the time of injury.  Muscle spasm in the hamstring muscles.  Pain or weakness with running, jumping, or bending the knee against resistance.  Crackling sound (crepitation) when the tendon is moved or touched.  Bruising (contusion) in the thigh within 48 hours of injury.  Loss of fullness of the muscle, or area of muscle bulging in the case of a complete rupture. CAUSES  A muscle strain occurs when a force is placed on the muscle or tendon that is greater than it can withstand. Common causes of injury include:  Strain from overuse or sudden increase in the frequency, duration, or intensity of activity.  Single violent blow or force to the back of the knee or the hamstring area of the thigh. RISK INCREASES WITH:  Sports that require quick starts (sprinting, racquetball, tennis).  Sports that require jumping (basketball, volleyball).  Kicking sports and water skiing.  Contact sports (soccer, football).  Poor  strength and flexibility.  Failure to warm up properly before activity.  Previous thigh, knee, or pelvis injury.  Poor exercise technique.  Poor posture. PREVENTION  Maintain physical fitness:  Strength, flexibility, and endurance.  Cardiovascular fitness.  Learn and use proper exercise technique and posture.  Wear proper fitted and padded protective equipment. PROGNOSIS  If treated properly, hamstring strains are usually curable in 2 to 6 weeks. RELATED COMPLICATIONS   Longer healing time, if not properly treated or if not given adequate time to heal.  Chronically inflamed tendon, causing persistent pain with activity that may progress to constant pain.  Recurring symptoms, if activity is resumed too soon.  Vulnerable to repeated injury (in up to 25% of cases). TREATMENT  Treatment first involves the use of ice and medication to help reduce pain and inflammation. It is also important to complete strengthening and stretching exercises, as well as modifying any activities that aggravate the symptoms. These exercises may be completed at home or with a therapist. Your caregiver may recommend the use of crutches to help reduce pain and discomfort, especially is the strain is severe enough to cause limping. If the tendon has pulled away from the bone, then surgery is necessary to reattach it. MEDICATION   If pain medicine is needed, nonsteroidal anti-inflammatory medicines (aspirin and ibuprofen), or other minor pain relievers (acetaminophen), are often advised.  Do not take pain medicine for 7 days before surgery.  Prescription pain relievers may be given if your caregiver thinks they are needed. Use only as directed and only as much as you need.  Corticosteroid injections may be   recommended. However, these injections should only be used for serious cases, as they can only be given a certain number of times.  Ointments applied to the skin may be beneficial. HEAT AND  COLD  Cold treatment (icing) relieves pain and reduces inflammation. Cold treatment should be applied for 10 to 15 minutes every 2 to 3 hours, and immediately after activity that aggravates your symptoms. Use ice packs or an ice massage.  Heat treatment may be used before performing the stretching and strengthening activities prescribed by your caregiver, physical therapist, or athletic trainer. Use a heat pack or a warm water soak. SEEK MEDICAL CARE IF:   Symptoms get worse or do not improve in 2 weeks, despite treatment.  New, unexplained symptoms develop. (Drugs used in treatment may produce side effects.) EXERCISES RANGE OF MOTION (ROM) AND STRETCHING EXERCISES - Hamstring Strain These exercises may help you when beginning to rehabilitate your injury. Your symptoms may go away with or without further involvement from your physician, physical therapist or athletic trainer. While completing these exercises, remember:   Restoring tissue flexibility helps normal motion to return to the joints. This allows healthier, less painful movement and activity.  An effective stretch should be held for at least 30 seconds.  A stretch should never be painful. You should only feel a gentle lengthening or release in the stretched tissue. STRETCH - Hamstrings, Standing  Stand or sit, and extend your right / left leg, placing your foot on a chair or foot stool.  Keep a slight arch in your low back and your hips straight forward.  Lead with your chest, and lean forward at the waist until you feel a gentle stretch in the back of your right / left knee or thigh. (When done correctly, this exercise requires leaning only a small distance.)  Hold this position for __________ seconds. Repeat __________ times. Complete this stretch __________ times per day. STRETCH - Hamstrings, Supine   Lie on your back. Loop a belt or towel over the ball of your right / left foot.  Straighten your right / left knee and  slowly pull on the belt to raise your leg. Do not allow the right / left knee to bend. Keep your opposite leg flat on the floor.  Raise the leg until you feel a gentle stretch behind your right / left knee or thigh. Hold this position for __________ seconds. Repeat __________ times. Complete this stretch __________ times per day.  STRETCH - Hamstrings, Doorway  Lie on your back with your right / left leg extended and resting on the wall, and the opposite leg flat on the ground through the door. Initially, position your bottom farther away from the wall.  Keep your right / left knee straight. If you feel a stretch behind your knee or thigh, hold this position for __________ seconds.  If you do not feel a stretch, scoot your bottom closer to the door and hold __________ seconds. Repeat __________ times. Complete this stretch __________ times per day.  STRETCH - Hamstrings/Adductors, V-Sit   Sit on the floor with your legs extended in a large "V," keeping your knees straight.  With your head and chest upright, bend at your waist reaching for your left foot to stretch your right thigh muscles.  You should feel a stretch in your right inner thigh. Hold for __________ seconds.  Return to the upright position to relax your leg muscles.  Continuing to keep your chest upright, bend straight forward at your  waist to stretch your hamstrings.  You should feel a stretch behind both of your thighs and knees. Hold for __________ seconds.  Return to the upright position to relax your leg muscles.  With your head and chest upright, bend at your waist reaching for your right foot to stretch your left thigh muscles.  You should feel a stretch in your left inner thigh. Hold for __________ seconds.  Return to the upright position to relax your leg muscles. Repeat __________ times. Complete this exercise __________ times per day.  STRENGTHENING EXERCISES - Hamstring Strain These exercises may help you  when beginning to rehabilitate your injury. They may resolve your symptoms with or without further involvement from your physician, physical therapist or athletic trainer. While completing these exercises, remember:   Muscles can gain both the endurance and the strength needed for everyday activities through controlled exercises.  Complete these exercises as instructed by your physician, physical therapist or athletic trainer. Increase the resistance and repetitions only as guided.  You may experience muscle soreness or fatigue, but the pain or discomfort you are trying to eliminate should never get worse during these exercises. If this pain does get worse, stop and make certain you are following the directions exactly. If the pain is still present after adjustments, discontinue the exercise until you can discuss the trouble with your clinician. STRENGTH - Hip Extensors, Straight Leg Raises   Lie on your stomach on a firm surface.  Tense the muscles in your buttocks to lift your right / left leg about 4 inches. If you cannot lift your leg this high without arching your back, place a pillow under your hips.  Keep your knee straight. Hold for __________ seconds.  Slowly lower your leg to the starting position and allow it to relax completely before starting the next repetition. Repeat __________ times. Complete this exercise __________ times per day.  STRENGTH - Hamstring, Isometrics   Lie on your back on a firm surface.  Bend your right / left knee approximately __________ degrees.  Dig your heel into the surface, as if you are trying to pull it toward your buttocks. Tighten the muscles in the back of your thighs to "dig" as hard as you can, without increasing any pain.  Hold this position for __________ seconds.  Release the tension gradually and allow your muscles to completely relax for __________ seconds between each exercise. Repeat __________ times. Complete this exercise __________  times per day.  STRENGTH - Hamstring, Curls   Lay on your stomach with your legs extended. (If you lay on a bed, your feet may hang over the edge.)  Tighten the muscles in the back of your thigh to bend your right / left knee up to 90 degrees. Keep your hips flat on the bed or floor.  Hold this position for __________ seconds.  Slowly lower your leg back to the starting position. Repeat __________ times. Complete this exercise __________ times per day.  OPTIONAL ANKLE WEIGHTS: Begin with ____________________, but DO NOT exceed ____________________. Increase in 1 pound/0.5 kilogram increments.   This information is not intended to replace advice given to you by your health care provider. Make sure you discuss any questions you have with your health care provider.   Document Released: 10/20/2005 Document Revised: 11/10/2014 Document Reviewed: 02/01/2009 Elsevier Interactive Patient Education 2016 [employer]  

## 2015-10-12 NOTE — Assessment & Plan Note (Signed)
Given exercises, heat last visit. Has tolerated methocarbamol in the past. She does have some somatic dysfunctions that I think are contributing to her symptoms and I think she would benefit from OMT. Treated today with good results as discussed below.

## 2015-10-18 ENCOUNTER — Encounter: Payer: Self-pay | Admitting: Family Medicine

## 2015-10-18 ENCOUNTER — Ambulatory Visit (INDEPENDENT_AMBULATORY_CARE_PROVIDER_SITE_OTHER): Payer: Medicaid Other | Admitting: Family Medicine

## 2015-10-18 VITALS — BP 90/61 | HR 69 | Temp 98.7°F | Ht 68.0 in | Wt 201.0 lb

## 2015-10-18 DIAGNOSIS — M791 Myalgia: Secondary | ICD-10-CM

## 2015-10-18 DIAGNOSIS — F0631 Mood disorder due to known physiological condition with depressive features: Secondary | ICD-10-CM

## 2015-10-18 DIAGNOSIS — F329 Major depressive disorder, single episode, unspecified: Secondary | ICD-10-CM | POA: Diagnosis not present

## 2015-10-18 DIAGNOSIS — M7918 Myalgia, other site: Secondary | ICD-10-CM

## 2015-10-18 MED ORDER — DULOXETINE HCL 20 MG PO CPEP: 20.0000 mg | ORAL_CAPSULE | Freq: Every day | ORAL | Status: DC

## 2015-10-18 NOTE — Progress Notes (Signed)
BP 90/61 mmHg  Pulse 69  Temp(Src) 98.7 F (37.1 C)  Ht 5\' 8"  (1.727 m)  Wt 201 lb (91.173 kg)  BMI 30.57 kg/m2  SpO2 99%  LMP 10/30/2014   Subjective:    Patient ID: Renee Benjamin, female    DOB: 03/29/1981, 34 y.o.   MRN: [id number]  HPI: Renee Benjamin is a 34 y.o. female  Chief Complaint  Patient presents with  . OMM  . Muscle Pain   Helps for a day or 2 then her pain starts to come back again. Feels like the pain has gotten sigificantly worse. She notes that ever since having her surgery for her hysterectomy, she feels like she is aching and not doing well again. Has done PT again. Pain OMT doesn't seem to really helping. She notes that after she is treated, she feels better for a few hours and then feels like her muscles are tearing out of her body and she feels like she needs to go to the ER. Not better. However, her neck is better and her torticollis resolved. She is concerned about why she hurts so much. Feeling very frustrated and very depressed. Notes that her pain makes her depressed. Was treated for post-partum in the past, but that got better and she hasn't had a problem with it in about a year. She notes that this doesn't feel like her depression, feels different. Pain mainly in her elbows, low back and shoulders. She notes that even light touch will sometimes set her off and make her feel worse. She has otherwise been feeling well with no other concerns or complaints at this time.    Relevant past medical, surgical, family and social history reviewed and updated as indicated. Interim medical history since our last visit reviewed. Allergies and medications reviewed and updated.  Review of Systems  Constitutional: Negative.   Respiratory: Negative.   Cardiovascular: Negative.   Musculoskeletal: Positive for myalgias, back pain, arthralgias, neck pain and neck stiffness. Negative for joint swelling and gait problem.  Psychiatric/Behavioral: Negative.     Per  HPI unless specifically indicated above     Objective:    BP 90/61 mmHg  Pulse 69  Temp(Src) 98.7 F (37.1 C)  Ht 5\' 8"  (1.727 m)  Wt 201 lb (91.173 kg)  BMI 30.57 kg/m2  SpO2 99%  LMP 10/30/2014  Wt Readings from Last 3 Encounters:  10/18/15 201 lb (91.173 kg)  10/12/15 201 lb 9.6 oz (91.445 kg)  10/08/15 201 lb (91.173 kg)    Physical Exam  Constitutional: She is oriented to person, place, and time. She appears well-developed and well-nourished. No distress.  HENT:  Head: Normocephalic and atraumatic.  Right Ear: Hearing and external ear normal.  Left Ear: Hearing and external ear normal.  Nose: Nose normal.  Mouth/Throat: Oropharynx is clear and moist. No oropharyngeal exudate.  Eyes: Conjunctivae, EOM and lids are normal. Pupils are equal, round, and reactive to light. Right eye exhibits no discharge. Left eye exhibits no discharge. No scleral icterus.  Neck: Normal range of motion. Neck supple. No JVD present. No tracheal deviation present. No thyromegaly present.  Pulmonary/Chest: Effort normal. No stridor. No respiratory distress.  Musculoskeletal: Normal range of motion.  Lymphadenopathy:    She has no cervical adenopathy.  Neurological: She is alert and oriented to person, place, and time.  Skin: Skin is warm, dry and intact. No rash noted. No erythema. No pallor.  Psychiatric: She has a normal mood and affect. Her speech  is normal and behavior is normal. Judgment and thought content normal. Cognition and memory are normal.  Nursing note and vitals reviewed.   Results for orders placed or performed in visit on 10/02/15  CBC With Differential/Platelet  Result Value Ref Range   WBC 7.9 3.4 - 10.8 x10E3/uL   RBC 4.15 3.77 - 5.28 x10E6/uL   Hemoglobin 13.6 11.1 - 15.9 g/dL   Hematocrit 37.6 34.0 - 46.6 %   MCV 91 79 - 97 fL   MCH 32.8 26.6 - 33.0 pg   MCHC 36.2 (H) 31.5 - 35.7 g/dL   RDW 12.3 12.3 - 15.4 %   Platelets 259 150 - 379 x10E3/uL   Neutrophils 61 %    Lymphs 32 %   MID 7 %   Neutrophils Absolute 4.9 1.4 - 7.0 x10E3/uL   Lymphocytes Absolute 2.5 0.7 - 3.1 x10E3/uL   MID (Absolute) 0.5 0.1 - 1.6 X10E3/uL  C-reactive protein  Result Value Ref Range   CRP 5.0 (H) 0.0 - 4.9 mg/L  ANA w/Reflex if Positive  Result Value Ref Range   Anit Nuclear Antibody(ANA) Negative Negative  Rocky mtn spotted fvr ab, IgG-blood  Result Value Ref Range   RMSF IgG Negative Negative  Rocky mtn spotted fvr ab, IgM-blood  Result Value Ref Range   RMSF IgM 0.14 0.00 - 0.89 index  B. burgdorfi antibodies  Result Value Ref Range   Lyme IgG/IgM Ab <0.91 0.00 - 0.90 ISR      Assessment & Plan:   Problem List Items Addressed This Visit    None    Visit Diagnoses    Myofascial pain    -  Primary    Patient has had blood work done for rheumatologic issues. Concern for possible fibromyalgia. Will start cymbalta as she is also depressed. Recheck 1 month.     Other depression due to general medical condition        Will get her started on cymbalta given her pain and her depression. Return to recheck with PCP in 1 month. Continue to monitor.       Will stop OMT due to pain and negative reaction to the treatment. Will start cymbalta and she will follow up with PCP in 1 month to see how she's doing and whether she needs further work up.   Follow up plan: Return in about 4 weeks (around 11/15/2015) for Follow up depression/?Fibro with Dr. Sanda Klein.

## 2015-11-02 ENCOUNTER — Encounter: Payer: Self-pay | Admitting: *Deleted

## 2015-11-02 ENCOUNTER — Ambulatory Visit
Admission: EM | Admit: 2015-11-02 | Discharge: 2015-11-02 | Disposition: A | Discharge status: Home / Self Care | Payer: Medicaid Other | Attending: Family Medicine | Admitting: Family Medicine

## 2015-11-02 DIAGNOSIS — S39012A Strain of muscle, fascia and tendon of lower back, initial encounter: Secondary | ICD-10-CM

## 2015-11-02 MED ORDER — CYCLOBENZAPRINE HCL 10 MG PO TABS: 10.0000 mg | ORAL_TABLET | Freq: Every day | ORAL | Status: DC

## 2015-11-02 MED ORDER — KETOROLAC TROMETHAMINE 10 MG PO TABS: 10.0000 mg | ORAL_TABLET | Freq: Three times a day (TID) | ORAL | Status: DC | PRN

## 2015-11-02 NOTE — ED Provider Notes (Signed)
CSN: [id number]     Arrival date & time 11/02/15  1650 History   First MD Initiated Contact with Patient 11/02/15 1826     Chief Complaint  Patient presents with  . Back Pain  . Hip Pain    right side   (Consider location/radiation/quality/duration/timing/severity/associated sxs/prior Treatment) HPI Comments: 34 yo female with a 1 week h/o right low back pain that started after getting a massage 1 week ago. Complains of pain to the right low back and upper back of the buttock. Denies pain radiating down the leg; denies numbness/tingling, saddle anesthesia, or bowel/bladder problems.  The history is provided by the patient.    Past Medical History  Diagnosis Date  . Allergy   . Asthma   . Endometriosis   . Polycystic ovarian disease   . Hypothyroidism   . Headache     otc med prn  . Anemia   . Vitamin D deficiency disease   . Thoracic back pain   . Anxiety     separation anxiety   Past Surgical History  Procedure Laterality Date  . Appendectomy  2008  . Shoulder surgery  2001    right  . Cesarean section  2014  . Laparoscopic hysterectomy N/A 01/02/2015    Procedure: HYSTERECTOMY TOTAL LAPAROSCOPIC;  Surgeon: Osborne Oman, MD;  Location: [facility];  Service: Gynecology;  Laterality: N/A;  . Laparoscopic bilateral salpingectomy Bilateral 01/02/2015    Procedure: LAPAROSCOPIC BILATERAL SALPINGECTOMY;  Surgeon: Osborne Oman, MD;  Location: [facility];  Service: Gynecology;  Laterality: Bilateral;  . Abdominal hysterectomy  March 2016    Due to uterus being stiched into C Section incision   Family History  Problem Relation Age of Onset  . Diabetes Father   . Osteoporosis Mother   . Cancer Maternal Grandmother     breast  . Osteoporosis Maternal Grandmother    Social History  Substance Use Topics  . Smoking status: Former Smoker -- 0.25 packs/day for 4 years    Types: Cigarettes    Quit date: 04/17/2012  . Smokeless tobacco: Never Used  . Alcohol Use: No   OB  History    Gravida Para Term Preterm AB TAB SAB Ectopic Multiple Living   1 1        1       Obstetric Comments   1st Menstrual Cycle:  12  1st Pregnancy:  31     Review of Systems  Allergies  Depakote; Morphine; Propoxyphene; Tramadol; and Valproic acid  Home Medications   Prior to Admission medications   Medication Sig Start Date End Date Taking? Authorizing Provider  albuterol (PROVENTIL HFA;VENTOLIN HFA) 108 (90 BASE) MCG/ACT inhaler Inhale 2 puffs into the lungs every 6 (six) hours as needed for wheezing or shortness of breath.   Yes Historical Provider, MD  DULoxetine (CYMBALTA) 20 MG capsule Take 1 capsule (20 mg total) by mouth daily. 10/18/15  Yes Megan P Johnson, DO  fexofenadine (ALLEGRA) 180 MG tablet Take 180 mg by mouth daily.   Yes Historical Provider, MD  levothyroxine (SYNTHROID, LEVOTHROID) 75 MCG tablet Take 1 tablet (75 mcg total) by mouth daily before breakfast. 05/09/15  Yes Arnetha Courser, MD  methocarbamol (ROBAXIN) 500 MG tablet 1-2 tablets TID PRN, DO NOT DRIVE ON THIS MEDICINE, IT MAY MAKE YOU SLEEPY 10/08/15  Yes Megan P Johnson, DO  mometasone (NASONEX) 50 MCG/ACT nasal spray Place 2 sprays into the nose daily.   Yes Arnetha Courser, MD  montelukast (  SINGULAIR) 10 MG tablet Take 10 mg by mouth at bedtime.   Yes Historical Provider, MD  SUMAtriptan (IMITREX) 100 MG tablet Take 100 mg by mouth every 2 (two) hours as needed for migraine. May repeat in 2 hours if headache persists or recurs.   Yes Historical Provider, MD  topiramate (TOPAMAX) 100 MG tablet Take 1 tablet (100 mg total) by mouth daily. Never stop abruptly 05/09/15  Yes Arnetha Courser, MD  cyclobenzaprine (FLEXERIL) 10 MG tablet Take 1 tablet (10 mg total) by mouth at bedtime. 11/02/15   Norval Gable, MD  ketorolac (TORADOL) 10 MG tablet Take 1 tablet (10 mg total) by mouth every 8 (eight) hours as needed. 11/02/15   Norval Gable, MD   Meds Ordered and Administered this Visit  Medications - No data to  display  BP 108/69 mmHg  Pulse 65  Temp(Src) 98.4 F (36.9 C) (Oral)  Resp 20  Ht 5\' 8"  (1.727 m)  Wt 201 lb (91.173 kg)  BMI 30.57 kg/m2  SpO2 100%  LMP 10/30/2014 No data found.   Physical Exam  Constitutional: She appears well-developed and well-nourished. No distress.  Musculoskeletal: She exhibits tenderness. She exhibits no edema.       Lumbar back: She exhibits tenderness (over the right lumbar sacral paraspinous muscles) and spasm. She exhibits normal range of motion, no bony tenderness, no swelling, no edema, no deformity, no laceration, no pain and normal pulse.       Back:  Neurological: She is alert. She has normal reflexes. She displays normal reflexes. She exhibits normal muscle tone. Coordination normal.  Skin: Skin is warm and dry. No rash noted. She is not diaphoretic. No erythema.  Nursing note and vitals reviewed.   ED Course  Procedures (including critical care time)  Labs Review Labs Reviewed - No data to display  Imaging Review No results found.   Visual Acuity Review  Right Eye Distance:   Left Eye Distance:   Bilateral Distance:    Right Eye Near:   Left Eye Near:    Bilateral Near:         MDM   1. Lumbar strain, initial encounter    Discharge Medication List as of 11/02/2015  6:43 PM    START taking these medications   Details  cyclobenzaprine (FLEXERIL) 10 MG tablet Take 1 tablet (10 mg total) by mouth at bedtime., Starting 11/02/2015, Until Discontinued, Normal    ketorolac (TORADOL) 10 MG tablet Take 1 tablet (10 mg total) by mouth every 8 (eight) hours as needed., Starting 11/02/2015, Until Discontinued, Normal       1. diagnosis reviewed with patient 2. rx as per orders above; reviewed possible side effects, interactions, risks and benefits  3. Recommend supportive treatment with heat to affected area, gentle low back stretches 4. Follow-up prn if symptoms worsen or don't improve    Norval Gable, MD 11/02/15  [phone]

## 2015-11-02 NOTE — ED Notes (Signed)
Patient had a massage on 10/26/15 and she starting having severe lower back pain and right hip pain immediately after the message. Symptoms have steadily worsened. Patient has a history of lower back and right hip pain.

## 2015-11-15 ENCOUNTER — Telehealth: Payer: Self-pay

## 2015-11-15 ENCOUNTER — Ambulatory Visit (INDEPENDENT_AMBULATORY_CARE_PROVIDER_SITE_OTHER): Payer: Medicaid Other | Admitting: Family Medicine

## 2015-11-15 ENCOUNTER — Encounter: Payer: Self-pay | Admitting: Family Medicine

## 2015-11-15 VITALS — BP 118/87 | HR 102 | Wt 197.0 lb

## 2015-11-15 DIAGNOSIS — M791 Myalgia, unspecified site: Secondary | ICD-10-CM

## 2015-11-15 DIAGNOSIS — M2550 Pain in unspecified joint: Secondary | ICD-10-CM | POA: Diagnosis not present

## 2015-11-15 DIAGNOSIS — R5382 Chronic fatigue, unspecified: Secondary | ICD-10-CM

## 2015-11-15 DIAGNOSIS — G478 Other sleep disorders: Secondary | ICD-10-CM | POA: Diagnosis not present

## 2015-11-15 MED ORDER — DULOXETINE HCL 30 MG PO CPEP: ORAL_CAPSULE | ORAL | Status: DC

## 2015-11-15 MED ORDER — TRAZODONE HCL 50 MG PO TABS: 50.0000 mg | ORAL_TABLET | Freq: Every evening | ORAL | Status: DC | PRN

## 2015-11-15 NOTE — Telephone Encounter (Signed)
She forgot to ask you for a letter if possible. She recently got a seat belt ticket. She wasn't wearing it that day because her fibromyalgia was really bothering her. She states she always wears it when she can, but that day it was too painful. They told her if her doctor could write a brief letter stating that she has fibromyalgia and some days she can't wear it due to the pain and discomfort.

## 2015-11-15 NOTE — Progress Notes (Signed)
BP 118/87 mmHg  Pulse 102  Wt 197 lb (89.359 kg)  SpO2 97%  LMP 10/30/2014   Subjective:    Patient ID: Renee Benjamin, female    DOB: [birth year], 35 y.o.   MRN: [id number]  HPI: Renee Benjamin is a 35 y.o. female  Chief Complaint  Patient presents with  . Fibromyalgia    she was seeing Dr.Johnson for OMM, it wasn't helping and she mentioned she might have fibromyalgia.   She is here for follow-up of fibromyalgia; now on cymbalta She went home and did research on it; everything she read about fibromyalgia fits her; she was hurting so bad with the cold weather this past weekend, she couldn't even move; she stays tired all day long and can't sleep; she doesn't sleep well; no energy Her mother has fibromyalgia Pain in legs started while on Levaquin, and getting all over and progressively worse She has been seeing D.O. here and getting OMM No labs have been done since October 02, 2015; few copied and pasted below:     CRP 0.0 - 4.9 mg/L 5.0 (H)       Anit Nuclear Antibody(ANA) Negative  Negative       Lyme IgG/IgM Ab 0.00 - 0.90 ISR <0.91   Comments:                Negative     <0.91                  Equivocal 0.91 - 1.09                  Positive     >1.09         She can't stand the seatbelt anymore across hips and chest, feels like someone is suffocating her She can't sit in the back seat any more, gets panicky, gets panicky if in a small space, that is all new Not sleeping well, tried benadryl Migraines are back  Relevant past medical, surgical, family and social history reviewed and updated as indicated. Interim medical history since our last visit reviewed. Allergies and medications reviewed and updated. She never picked up the cyclobenzaprine, ketorolac; removed from med list  Review of Systems Per HPI unless specifically indicated above     Objective:    BP 118/87 mmHg  Pulse 102  Wt 197  lb (89.359 kg)  SpO2 97%  LMP 10/30/2014  Wt Readings from Last 3 Encounters:  11/15/15 197 lb (89.359 kg)  11/02/15 201 lb (91.173 kg)  10/18/15 201 lb (91.173 kg)    Physical Exam  Constitutional: She appears well-developed and well-nourished. No distress.  HENT:  Head: Normocephalic and atraumatic.  Eyes: No scleral icterus.  Neck: No JVD present. No thyromegaly present.  Cardiovascular: Normal rate.   Pulmonary/Chest: Effort normal and breath sounds normal.  Musculoskeletal: She exhibits no edema.  Neurological: She is alert.  Skin: Skin is warm and dry.  Upper arms are erythematous; no keratosis pilaris; multiple tiny blanching erythematous lesions, macular; erythema at the base of her cuticles; proximal fingernails erythematous with lightening further distally; no malar rash   Results for orders placed or performed in visit on 10/02/15  CBC With Differential/Platelet  Result Value Ref Range   WBC 7.9 3.4 - 10.8 x10E3/uL   RBC 4.15 3.77 - 5.28 x10E6/uL   Hemoglobin 13.6 11.1 - 15.9 g/dL   Hematocrit 37.6 34.0 - 46.6 %   MCV 91 79 - 97 fL   MCH 32.8  26.6 - 33.0 pg   MCHC 36.2 (H) 31.5 - 35.7 g/dL   RDW 12.3 12.3 - 15.4 %   Platelets 259 150 - 379 x10E3/uL   Neutrophils 61 %   Lymphs 32 %   MID 7 %   Neutrophils Absolute 4.9 1.4 - 7.0 x10E3/uL   Lymphocytes Absolute 2.5 0.7 - 3.1 x10E3/uL   MID (Absolute) 0.5 0.1 - 1.6 X10E3/uL  C-reactive protein  Result Value Ref Range   CRP 5.0 (H) 0.0 - 4.9 mg/L  ANA w/Reflex if Positive  Result Value Ref Range   Anit Nuclear Antibody(ANA) Negative Negative  Rocky mtn spotted fvr ab, IgG-blood  Result Value Ref Range   RMSF IgG Negative Negative  Rocky mtn spotted fvr ab, IgM-blood  Result Value Ref Range   RMSF IgM 0.14 0.00 - 0.89 index  B. burgdorfi antibodies  Result Value Ref Range   Lyme IgG/IgM Ab <0.91 0.00 - 0.90 ISR      Assessment & Plan:   Problem List Items Addressed This Visit      Other   Myalgia -  Primary    Refer to rheumatologist; if fibromyalgia, sleep is so important; will have her use trazodone to help with sleep; increase Cymbalta to 30 mg daily and then 60 mg daily; close f/u in 3-4 weeks; call before then for any side effects or problems      Relevant Orders   Ambulatory referral to Rheumatology   Arthralgia    With skin changes, changes at the cuticles, nailbeds; refer to rheumatologist; reviewed last labs, CRP 5 (barely elevated); I will not do additional labs today      Relevant Orders   Ambulatory referral to Rheumatology    Other Visit Diagnoses    Chronic fatigue        likely fibromyalgia; she had neg labs for Lyme; referring to rheum to see if other cause of fatigue, given her skin/nail changes    Poor sleep pattern        start trazodone, sleep quality so important if she does have fibro especially       Follow up plan: Return 3-4 weeks, for med follow-up.  Meds ordered this encounter  Medications  . traZODone (DESYREL) 50 MG tablet    Sig: Take 1 tablet (50 mg total) by mouth at bedtime as needed for sleep.    Dispense:  30 tablet    Refill:  2  . DULoxetine (CYMBALTA) 30 MG capsule    Sig: One by mouth every morning for 8 days, then two every morning    Dispense:  52 capsule    Refill:  0   Orders Placed This Encounter  Procedures  . Ambulatory referral to Rheumatology   An after-visit summary was printed and given to the patient at [facility].  Please see the patient instructions which may contain other information and recommendations beyond what is mentioned above in the assessment and plan. Face-to-face time with patient was more than 25 minutes, >50% time spent counseling and coordination of care

## 2015-11-15 NOTE — Patient Instructions (Addendum)
We'll refer you to the rheumatologist for evaluation Skip your cymbalta tonight Start 30 mg of cymbalta tomorrow morning and take 30 mg daily for 8 days, then go to 60 mg every morning Use the trazodone at night for sleep Continue stretching and walking I would like for you to give up as much processed food and sugar as you can Call me with any questions before appt Try turmeric as a natural anti-inflammatory (for pain and arthritis). It comes in capsules where you buy aspirin and fish oil, but also as a spice where you buy pepper and garlic powder.

## 2015-11-15 NOTE — Assessment & Plan Note (Addendum)
Refer to rheumatologist; if fibromyalgia, sleep is so important; will have her use trazodone to help with sleep; increase Cymbalta to 30 mg daily and then 60 mg daily; close f/u in 3-4 weeks; call before then for any side effects or problems

## 2015-11-15 NOTE — Assessment & Plan Note (Addendum)
With skin changes, changes at the cuticles, nailbeds; refer to rheumatologist; reviewed last labs, CRP 5 (barely elevated); I will not do additional labs today

## 2015-11-19 ENCOUNTER — Encounter: Payer: Self-pay | Admitting: Family Medicine

## 2015-11-20 NOTE — Telephone Encounter (Signed)
I addressed this through [place]

## 2015-11-30 ENCOUNTER — Telehealth: Payer: Self-pay

## 2015-11-30 NOTE — Telephone Encounter (Signed)
Patient goes to see Dr. Meda Coffee from [facility] Tuesday 12-04-2015 at 2:15p.m.  I tried to call patient to notify her but she did not answer and her mailbox is full.  Sending heads up in case she calls back.

## 2015-11-30 NOTE — Telephone Encounter (Signed)
Thank you I'll send back to you to try again later

## 2015-12-02 ENCOUNTER — Other Ambulatory Visit: Payer: Self-pay | Admitting: Family Medicine

## 2015-12-03 ENCOUNTER — Other Ambulatory Visit: Payer: Self-pay | Admitting: Family Medicine

## 2015-12-03 MED ORDER — METHOCARBAMOL 500 MG PO TABS: 500.0000 mg | ORAL_TABLET | Freq: Three times a day (TID) | ORAL | Status: DC | PRN

## 2015-12-04 DIAGNOSIS — R5382 Chronic fatigue, unspecified: Secondary | ICD-10-CM | POA: Insufficient documentation

## 2015-12-05 ENCOUNTER — Ambulatory Visit (INDEPENDENT_AMBULATORY_CARE_PROVIDER_SITE_OTHER): Payer: Self-pay | Admitting: Family Medicine

## 2015-12-05 ENCOUNTER — Encounter: Payer: Self-pay | Admitting: Family Medicine

## 2015-12-05 VITALS — BP 105/72 | HR 83 | Temp 97.8°F | Wt 199.0 lb

## 2015-12-05 DIAGNOSIS — M791 Myalgia, unspecified site: Secondary | ICD-10-CM

## 2015-12-05 DIAGNOSIS — R519 Headache, unspecified: Secondary | ICD-10-CM

## 2015-12-05 DIAGNOSIS — R51 Headache: Secondary | ICD-10-CM

## 2015-12-05 MED ORDER — TOPIRAMATE 100 MG PO TABS: 50.0000 mg | ORAL_TABLET | Freq: Every day | ORAL | Status: DC

## 2015-12-05 MED ORDER — DULOXETINE HCL 30 MG PO CPEP: 90.0000 mg | ORAL_CAPSULE | Freq: Every day | ORAL | Status: DC

## 2015-12-05 NOTE — Patient Instructions (Addendum)
Do try to have a little bit of activity every day Try yoga at home, either a YouTube or DVD and do a little gentle stretching and relaxed breathing every day Increase the cymbalta to 90 mg daily Call me with an update in 2 weeks and follow-up in 4 weeks Focus on the parts that feel good

## 2015-12-05 NOTE — Assessment & Plan Note (Addendum)
Evaluated by rheumatologist yesterday; appreciate their evaluation; however, patient still in significant discomfort, with fatigue; will increase the cymbalta to 90 mg daily; encouraged yoga; continue turmeric; sleep is so important, and I'm glad to hear that the trazodone is helping; encouragement given; okay to write letter per patient to employer that she may miss days of work for medical condition; she has not been there long enough she says to qualify for FMLA; f/u with me, but call before next visit if needed

## 2015-12-05 NOTE — Assessment & Plan Note (Signed)
Patient was taking topiramate 100 mg every other night on her own; suggested she take 50 mg every night instead; she agrees

## 2015-12-05 NOTE — Progress Notes (Signed)
BP 105/72 mmHg  Pulse 83  Temp(Src) 97.8 F (36.6 C)  Wt 199 lb (90.266 kg)  SpO2 100%  LMP 10/30/2014   Subjective:    Patient ID: Renee Benjamin, female    DOB: [birth year], 35 y.o.   MRN: [id number]  HPI: Renee Benjamin is a 35 y.o. female  Chief Complaint  Patient presents with  . Fibromyalgia    4 week f/u fibromyalgia. Increased Cymbalta at last visit. She saw Rheumatology yesterday.   She says she saw rheumatologist yesterday; she works from 60;30 to 5:00 pm; does not have time to do yoga or tai chi or acupuncture session She has been on 60 mg almost a month The rheumatologist did a lot of testing yesterday and she's feeling it today; her stomach feels like it is in a boat; she has slippers on today because she thinks she would scream if she put shoes on; she has days when she cannot control her pain; she has tingling in her back; yesterday arms and palms tingled all day; comes and goes; every day there is something different going on; goes and comes; pain is first and foremost the biggest problem; she has trouble concentrating; she has a two year old; trouble focusing Rheumatologist did not change any medicine; the cymbalta is helping with the widespread nerve pain; that is under control; her muscles and joints are still hurting; she showed her the nail changes and the red arms and the petechiae; she did not think it was important She just woke up one morning and then "what happened" The trazodone has really helped her sleep; the topamax did not help her sleep any more, her body got used to it; her two year old gets up and that can't be helped She is taking the topamax every OTHER night right now The main problem is the flare-up She can't even think about mopping the floors Turmeric helps some  Relevant past medical, surgical, family and social history reviewed and updated as indicated. Interim medical history since our last visit reviewed. Allergies and medications  reviewed and updated.  Review of Systems Per HPI unless specifically indicated above     Objective:    BP 105/72 mmHg  Pulse 83  Temp(Src) 97.8 F (36.6 C)  Wt 199 lb (90.266 kg)  SpO2 100%  LMP 10/30/2014  Wt Readings from Last 3 Encounters:  12/05/15 199 lb (90.266 kg)  11/15/15 197 lb (89.359 kg)  11/02/15 201 lb (91.173 kg)    Physical Exam  Constitutional: She appears well-developed and well-nourished. No distress.  HENT:  Head: Normocephalic and atraumatic.  Eyes: No scleral icterus.  Neck: No JVD present. No thyromegaly present.  Cardiovascular: Normal rate and regular rhythm.   Pulmonary/Chest: Effort normal and breath sounds normal.  Musculoskeletal: She exhibits no edema.  Neurological: She is alert.  Skin: Skin is warm and dry.  Upper arms are erythematous; no keratosis pilaris; few tiny erythematous lesions, macular; erythema at the base of her cuticles; proximal fingernails erythematous with lightening further distally; no malar rash  Psychiatric: Her mood appears not anxious. Her affect is blunt (somewhat flat affect). She does not exhibit a depressed mood.  Good eye contact with examiner    Results for orders placed or performed in visit on 10/02/15  CBC With Differential/Platelet  Result Value Ref Range   WBC 7.9 3.4 - 10.8 x10E3/uL   RBC 4.15 3.77 - 5.28 x10E6/uL   Hemoglobin 13.6 11.1 - 15.9 g/dL   Hematocrit  37.6 34.0 - 46.6 %   MCV 91 79 - 97 fL   MCH 32.8 26.6 - 33.0 pg   MCHC 36.2 (H) 31.5 - 35.7 g/dL   RDW 12.3 12.3 - 15.4 %   Platelets 259 150 - 379 x10E3/uL   Neutrophils 61 %   Lymphs 32 %   MID 7 %   Neutrophils Absolute 4.9 1.4 - 7.0 x10E3/uL   Lymphocytes Absolute 2.5 0.7 - 3.1 x10E3/uL   MID (Absolute) 0.5 0.1 - 1.6 X10E3/uL  C-reactive protein  Result Value Ref Range   CRP 5.0 (H) 0.0 - 4.9 mg/L  ANA w/Reflex if Positive  Result Value Ref Range   Anit Nuclear Antibody(ANA) Negative Negative  Rocky mtn spotted fvr ab, IgG-blood   Result Value Ref Range   RMSF IgG Negative Negative  Rocky mtn spotted fvr ab, IgM-blood  Result Value Ref Range   RMSF IgM 0.14 0.00 - 0.89 index  B. burgdorfi antibodies  Result Value Ref Range   Lyme IgG/IgM Ab <0.91 0.00 - 0.90 ISR      Assessment & Plan:   Problem List Items Addressed This Visit      Other   Headache    Patient was taking topiramate 100 mg every other night on her own; suggested she take 50 mg every night instead; she agrees      Relevant Medications   topiramate (TOPAMAX) 100 MG tablet   DULoxetine (CYMBALTA) 30 MG capsule   ibuprofen (ADVIL,MOTRIN) 800 MG tablet   Myalgia - Primary    Evaluated by rheumatologist yesterday; appreciate their evaluation; however, patient still in significant discomfort, with fatigue; will increase the cymbalta to 90 mg daily; encouraged yoga; continue turmeric; sleep is so important, and I'm glad to hear that the trazodone is helping; encouragement given; okay to write letter per patient to employer that she may miss days of work for medical condition; she has not been there long enough she says to qualify for FMLA; f/u with me, but call before next visit if needed          Follow up plan: Return in about 4 weeks (around 01/02/2016).  Face-to-face time with patient was more than 15 minutes, >50% time spent counseling and coordination of care An after-visit summary was printed and given to the patient at [facility].  Please see the patient instructions which may contain other information and recommendations beyond what is mentioned above in the assessment and plan. Meds ordered this encounter  Medications  . topiramate (TOPAMAX) 100 MG tablet    Sig: Take 0.5 tablets (50 mg total) by mouth daily. Never stop abruptly    Dispense:  15 tablet    Refill:  3    Change in sig  . DULoxetine (CYMBALTA) 30 MG capsule    Sig: Take 3 capsules (90 mg total) by mouth daily. One by mouth every morning for 8 days, then two every morning     Dispense:  90 capsule    Refill:  2  . DISCONTD: ibuprofen (ADVIL,MOTRIN) 200 MG tablet    Sig: Take 200 mg by mouth every 6 (six) hours as needed. Reported on 12/05/2015  . ibuprofen (ADVIL,MOTRIN) 800 MG tablet    Sig: Take 800 mg by mouth every 8 (eight) hours as needed.

## 2015-12-06 ENCOUNTER — Ambulatory Visit: Payer: Medicaid Other | Admitting: Family Medicine

## 2016-01-02 ENCOUNTER — Encounter: Payer: Self-pay | Admitting: Family Medicine

## 2016-01-02 ENCOUNTER — Ambulatory Visit (INDEPENDENT_AMBULATORY_CARE_PROVIDER_SITE_OTHER): Payer: Self-pay | Admitting: Family Medicine

## 2016-01-02 VITALS — BP 101/69 | HR 91 | Temp 97.5°F | Wt 199.0 lb

## 2016-01-02 DIAGNOSIS — F909 Attention-deficit hyperactivity disorder, unspecified type: Secondary | ICD-10-CM

## 2016-01-02 DIAGNOSIS — M797 Fibromyalgia: Secondary | ICD-10-CM

## 2016-01-02 MED ORDER — DULOXETINE HCL 30 MG PO CPEP: 90.0000 mg | ORAL_CAPSULE | Freq: Every day | ORAL | Status: DC

## 2016-01-02 NOTE — Patient Instructions (Addendum)
Check out my favorite book on ADHD, Answers to Distraction by Hallowell and Ratey Pick up some diet tonic water and drink 4 ounces every evening for a couple of weeks to see if that helps Pick up some 250 mg magnesium oxide and take one a day to see if that helps Once your twitches are completely gone, I'll be willing to treat your ADHD Remember O-H-I-[id number] ("only handle it once") Stay well-hydrated and get plenty of rest    Myofascial Pain Syndrome and Fibromyalgia Myofascial pain syndrome and fibromyalgia are both pain disorders. This pain may be felt mainly in your muscles.   Myofascial pain syndrome:  Always has trigger points or tender points in the muscle that will cause pain when pressed. The pain may come and go.  Usually affects your neck, upper back, and shoulder areas. The pain often radiates into your arms and hands.  Fibromyalgia:  Has muscle pains and tenderness that come and go.  Is often associated with fatigue and sleep disturbances.  Has trigger points.  Tends to be long-lasting (chronic), but is not life-threatening. Fibromyalgia and myofascial pain are not the same. However, they often occur together. If you have both conditions, each can make the other worse. Both are common and can cause enough pain and fatigue to make day-to-day activities difficult.  CAUSES  The exact causes of fibromyalgia and myofascial pain are not known. People with certain gene types may be more likely to develop fibromyalgia. Some factors can be triggers for both conditions, such as:   Spine disorders.  Arthritis.  Severe injury (trauma) and other physical stressors.  Being under a lot of stress.  A medical illness. SIGNS AND SYMPTOMS  Fibromyalgia The main symptom of fibromyalgia is widespread pain and tenderness in your muscles. This can vary over time. Pain is sometimes described as stabbing, shooting, or burning. You may have tingling or numbness, too. You may also have sleep  problems and fatigue. You may wake up feeling tired and groggy (fibro fog). Other symptoms may include:   Bowel and bladder problems.  Headaches.  Visual problems.  Problems with odors and noises.  Depression or mood changes.  Painful menstrual periods (dysmenorrhea).  Dry skin or eyes. Myofascial pain syndrome Symptoms of myofascial pain syndrome include:   Tight, ropy bands of muscle.   Uncomfortable sensations in muscular areas, such as:  Aching.  Cramping.  Burning.  Numbness.  Tingling.   Muscle weakness.  Trouble moving certain muscles freely (range of motion). DIAGNOSIS  There are no specific tests to diagnose fibromyalgia or myofascial pain syndrome. Both can be hard to diagnose because their symptoms are common in many other conditions. Your health care provider may suspect one or both of these conditions based on your symptoms and medical history. Your health care provider will also do a physical exam.  The key to diagnosing fibromyalgia is having pain, fatigue, and other symptoms for more than three months that cannot be explained by another condition.  The key to diagnosing myofascial pain syndrome is finding trigger points in muscles that are tender and cause pain elsewhere in your body (referred pain). TREATMENT  Treating fibromyalgia and myofascial pain often requires a team of health care providers. This usually starts with your primary provider and a physical therapist. You may also find it helpful to work with alternative health care providers, such as massage therapists or acupuncturists. Treatment for fibromyalgia may include medicines. This may include nonsteroidal anti-inflammatory drugs (NSAIDs), along with other medicines.  Treatment for myofascial pain may also include:  NSAIDs.  Cooling and stretching of muscles.  Trigger point injections.  Sound wave (ultrasound) treatments to stimulate muscles. HOME CARE INSTRUCTIONS   Take medicines  only as directed by your health care provider.  Exercise as directed by your health care provider or physical therapist.  Try to avoid stressful situations.  Practice relaxation techniques to control your stress. You may want to try:  Biofeedback.  Visual imagery.  Hypnosis.  Muscle relaxation.  Yoga.  Meditation.  Talk to your health care provider about alternative treatments, such as acupuncture or massage treatment.  Maintain a healthy lifestyle. This includes eating a healthy diet and getting enough sleep.  Consider joining a support group.  Do not do activities that stress or strain your muscles. That includes repetitive motions and heavy lifting. SEEK MEDICAL CARE IF:   You have new symptoms.  Your symptoms get worse.  You have side effects from your medicines.  You have trouble sleeping.  Your condition is causing depression or anxiety. FOR MORE INFORMATION   National Fibromyalgia Association: http://www.fmaware.orgwww.fmaware.[place]: http://www.arthritis.orgwww.arthritis.org  American Chronic Pain Association: StreetWrestling.at.https://Leavens.biz/   This information is not intended to replace advice given to you by your health care provider. Make sure you discuss any questions you have with your health care provider.   Document Released: 10/20/2005 Document Revised: 11/10/2014 Document Reviewed: 07/26/2014 Elsevier Interactive Patient Education 2016 [employer].   Attention Deficit Hyperactivity Disorder Attention deficit hyperactivity disorder (ADHD) is a problem with behavior issues based on the way the brain functions (neurobehavioral disorder). It is a common reason for behavior and academic problems in school. SYMPTOMS  There are 3 types of ADHD. The 3 types and some of the symptoms include:  Inattentive.  Gets bored or distracted easily.  Loses or forgets things. Forgets  to hand in homework.  Has trouble organizing or completing tasks.  Difficulty staying on task.  An inability to organize daily tasks and school work.  Leaving projects, chores, or homework unfinished.  Trouble paying attention or responding to details. Careless mistakes.  Difficulty following directions. Often seems like is not listening.  Dislikes activities that require sustained attention (like chores or homework).  Hyperactive-impulsive.  Feels like it is impossible to sit still or stay in a seat. Fidgeting with hands and feet.  Trouble waiting turn.  Talking too much or out of turn. Interruptive.  Speaks or acts impulsively.  Aggressive, disruptive behavior.  Constantly busy or on the go; noisy.  Often leaves seat when they are expected to remain seated.  Often runs or climbs where it is not appropriate, or feels very restless.  Combined.  Has symptoms of both of the above. Often children with ADHD feel discouraged about themselves and with school. They often perform well below their abilities in school. As children get older, the excess motor activities can calm down, but the problems with paying attention and staying organized persist. Most children do not outgrow ADHD but with good treatment can learn to cope with the symptoms. DIAGNOSIS  When ADHD is suspected, the diagnosis should be made by professionals trained in ADHD. This professional will collect information about the individual suspected of having ADHD. Information must be collected from various settings where the person lives, works, or attends school.  Diagnosis will include:  Confirming symptoms began in childhood.  Ruling out other reasons for the child's behavior.  The health care providers will check with the child's school and check  their medical records.  They will talk to teachers and parents.  Behavior rating scales for the child will be filled out by those dealing with the child on a  daily basis. A diagnosis is made only after all information has been considered. TREATMENT  Treatment usually includes behavioral treatment, tutoring or extra support in school, and stimulant medicines. Because of the way a person's brain works with ADHD, these medicines decrease impulsivity and hyperactivity and increase attention. This is different than how they would work in a person who does not have ADHD. Other medicines used include antidepressants and certain blood pressure medicines. Most experts agree that treatment for ADHD should address all aspects of the person's functioning. Along with medicines, treatment should include structured classroom management at school. Parents should reward good behavior, provide constant discipline, and set limits. Tutoring should be available for the child as needed. ADHD is a lifelong condition. If untreated, the disorder can have long-term serious effects into adolescence and adulthood. HOME CARE INSTRUCTIONS   Often with ADHD there is a lot of frustration among family members dealing with the condition. Blame and anger are also feelings that are common. In many cases, because the problem affects the family as a whole, the entire family may need help. A therapist can help the family find better ways to handle the disruptive behaviors of the person with ADHD and promote change. If the person with ADHD is young, most of the therapist's work is with the parents. Parents will learn techniques for coping with and improving their child's behavior. Sometimes only the child with the ADHD needs counseling. Your health care providers can help you make these decisions.  Children with ADHD may need help learning how to organize. Some helpful tips include:  Keep routines the same every day from wake-up time to bedtime. Schedule all activities, including homework and playtime. Keep the schedule in a place where the person with ADHD will often see it. Mark schedule changes  as far in advance as possible.  Schedule outdoor and indoor recreation.  Have a place for everything and keep everything in its place. This includes clothing, backpacks, and school supplies.  Encourage writing down assignments and bringing home needed books. Work with your child's teachers for assistance in organizing school work.  Offer your child a well-balanced diet. Breakfast that includes a balance of whole grains, protein, and fruits or vegetables is especially important for school performance. Children should avoid drinks with caffeine including:  Soft drinks.  Coffee.  Tea.  However, some older children (adolescents) may find these drinks helpful in improving their attention. Because it can also be common for adolescents with ADHD to become addicted to caffeine, talk with your health care provider about what is a safe amount of caffeine intake for your child.  Children with ADHD need consistent rules that they can understand and follow. If rules are followed, give small rewards. Children with ADHD often receive, and expect, criticism. Look for good behavior and praise it. Set realistic goals. Give clear instructions. Look for activities that can foster success and self-esteem. Make time for pleasant activities with your child. Give lots of affection.  Parents are their children's greatest advocates. Learn as much as possible about ADHD. This helps you become a stronger and better advocate for your child. It also helps you educate your child's teachers and instructors if they feel inadequate in these areas. Parent support groups are often helpful. A national group with local chapters is called Children and  Adults with Attention Deficit Hyperactivity Disorder (CHADD). SEEK MEDICAL CARE IF:  Your child has repeated muscle twitches, cough, or speech outbursts.  Your child has sleep problems.  Your child has a marked loss of appetite.  Your child develops depression.  Your child has  new or worsening behavioral problems.  Your child develops dizziness.  Your child has a racing heart.  Your child has stomach pains.  Your child develops headaches. SEEK IMMEDIATE MEDICAL CARE IF:  Your child has been diagnosed with depression or anxiety and the symptoms seem to be getting worse.  Your child has been depressed and suddenly appears to have increased energy or motivation.  You are worried that your child is having a bad reaction to a medication he or she is taking for ADHD.   This information is not intended to replace advice given to you by your health care provider. Make sure you discuss any questions you have with your health care provider.   Document Released: 10/10/2002 Document Revised: 10/25/2013 Document Reviewed: 06/27/2013 Elsevier Interactive Patient Education [employer].

## 2016-01-02 NOTE — Progress Notes (Signed)
BP 101/69 mmHg  Pulse 91  Temp(Src) 97.5 F (36.4 C)  Wt 199 lb (90.266 kg)  SpO2 98%  LMP 10/30/2014   Subjective:    Patient ID: Renee Benjamin, female    DOB: February 22, 1981, 35 y.o.   MRN: [id number]  HPI: Renee Benjamin is a 35 y.o. female  Chief Complaint  Patient presents with  . Myalgia    follow up; fibromyalgia   She is here for fibro f/u; her pain level has improved by more than 50%; she thinks the cymbalta 90 mg is really doing the trick She still has the fatigue but it doesn't feel like her muscles are ripping any more Having little twitches in her muscles and eye sometimes;  Her teacher will talk and her focus is all over; she feels better but now getting active and again Diagnosed with ADHD at age 22 or 35 years old The first day she was treated with medicine, she could sit and just watch a movie; she could never just sit down; she was constantly going and going  She has been doing yoga; tai chi was too much, but the yoga is doing well; she feels like she is getting herself back She is not giving up cake at [employer] parties, but has really cut out sugars; has cut out all sugars; almost to unsweetened tea, much less sugar than before; feels a lot of better Easily distracted; going back to school in the fall She is in constant motion, went from one extreme to the next; hurt too much to move and now feeling so much better and moving better, moving a lot BP well-controlled, always good  Relevant past medical, social history reviewed Past Medical History  Diagnosis Date  . Allergy   . Asthma   . Endometriosis   . Polycystic ovarian disease   . Hypothyroidism   . Headache     otc med prn  . Anemia   . Vitamin D deficiency disease   . Thoracic back pain   . Anxiety     separation anxiety  . ADHD (attention deficit hyperactivity disorder) 01/09/2016  . Fibromyalgia 01/09/2016  ADHD and fibromyalgia added today  Interim medical history since our last visit  reviewed. Allergies and medications reviewed and updated.  Review of Systems Per HPI unless specifically indicated above     Objective:    BP 101/69 mmHg  Pulse 91  Temp(Src) 97.5 F (36.4 C)  Wt 199 lb (90.266 kg)  SpO2 98%  LMP 10/30/2014  Wt Readings from Last 3 Encounters:  01/04/16 199 lb (90.266 kg)  01/02/16 199 lb (90.266 kg)  12/05/15 199 lb (90.266 kg)    Physical Exam  Constitutional: She appears well-developed and well-nourished.  HENT:  Mouth/Throat: Mucous membranes are normal.  Eyes: EOM are normal. No scleral icterus.  Cardiovascular: Normal rate and regular rhythm.   Pulmonary/Chest: Effort normal and breath sounds normal.  Psychiatric: She has a normal mood and affect. Her behavior is normal.      Assessment & Plan:   Problem List Items Addressed This Visit      Musculoskeletal and Integument   Fibromyalgia - Primary    So glad she has improved so much with the SNRI therapy; continue same dose; get adequate rest, cut down sugars        Other   ADHD (attention deficit hyperactivity disorder)    Recommended Answers to Distraction by Hallowell and Ratey; she may return if interested in starting medicine;  discussed [place] strategy; see AVS         Follow up plan: Return next few weeks, whenever is good for you, for ADHD.  Meds ordered this encounter  Medications  . Vitamin D, Ergocalciferol, (DRISDOL) 50000 units CAPS capsule    Sig: Take 50,000 Units by mouth once a week.  . Cyanocobalamin (RA VITAMIN B-12 TR) 1000 MCG TBCR    Sig: Take 1,000 mcg by mouth daily.  . DULoxetine (CYMBALTA) 30 MG capsule    Sig: Take 3 capsules (90 mg total) by mouth daily. One by mouth every morning for 8 days, then two every morning    Dispense:  90 capsule    Refill:  5   An after-visit summary was printed and given to the patient at [facility].  Please see the patient instructions which may contain other information and recommendations beyond what is mentioned  above in the assessment and plan.

## 2016-01-04 ENCOUNTER — Encounter: Payer: Self-pay | Admitting: Emergency Medicine

## 2016-01-04 ENCOUNTER — Emergency Department
Admission: EM | Admit: 2016-01-04 | Discharge: 2016-01-04 | Disposition: A | Discharge status: Home / Self Care | Payer: BLUE CROSS/BLUE SHIELD | Attending: Emergency Medicine | Admitting: Emergency Medicine

## 2016-01-04 DIAGNOSIS — H109 Unspecified conjunctivitis: Secondary | ICD-10-CM | POA: Diagnosis not present

## 2016-01-04 DIAGNOSIS — Z79899 Other long term (current) drug therapy: Secondary | ICD-10-CM | POA: Insufficient documentation

## 2016-01-04 DIAGNOSIS — Z87891 Personal history of nicotine dependence: Secondary | ICD-10-CM | POA: Diagnosis not present

## 2016-01-04 DIAGNOSIS — J0101 Acute recurrent maxillary sinusitis: Secondary | ICD-10-CM | POA: Diagnosis not present

## 2016-01-04 DIAGNOSIS — H578 Other specified disorders of eye and adnexa: Secondary | ICD-10-CM | POA: Diagnosis present

## 2016-01-04 MED ORDER — SULFACETAMIDE SODIUM 10 % OP SOLN: 2.0000 [drp] | Freq: Four times a day (QID) | OPHTHALMIC | Status: DC

## 2016-01-04 MED ORDER — FLUCONAZOLE 150 MG PO TABS: 150.0000 mg | ORAL_TABLET | Freq: Every day | ORAL | Status: DC

## 2016-01-04 MED ORDER — AMOXICILLIN-POT CLAVULANATE 875-125 MG PO TABS: 1.0000 | ORAL_TABLET | Freq: Two times a day (BID) | ORAL | Status: AC

## 2016-01-04 NOTE — ED Notes (Signed)
Redness and dc to right eye.

## 2016-01-04 NOTE — ED Provider Notes (Signed)
[facility] Emergency Department Provider Note  ____________________________________________  Time seen: Approximately 12:47 PM  I have reviewed the triage vital signs and the nursing notes.   HISTORY  Chief Complaint Conjunctivitis    HPI Renee Benjamin is a 35 y.o. 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 Patient also has a past medical history of chronic allergies and has noted increased pain and pressure around her sinuses. Currently taken Allegra Singulair and Flonase with no relief. States that the eye is nonpainful and the discharge and redness starting today.   Past Medical History  Diagnosis Date  . Allergy   . Asthma   . Endometriosis   . Polycystic ovarian disease   . Hypothyroidism   . Headache     otc med prn  . Anemia   . Vitamin D deficiency disease   . Thoracic back pain   . Anxiety     separation anxiety    Patient Active Problem List   Diagnosis Date Noted  . Torticollis, acute 10/08/2015  . Headache 10/02/2015  . Thoracic back pain 10/02/2015  . Lumbar pain 10/02/2015  . Myalgia 10/02/2015  . Arthralgia 10/02/2015  . Chronic tonsillitis 08/20/2015  . Migraine headache with aura 04/10/2015  . Hypothyroidism 04/10/2015  . PTSD (post-traumatic stress disorder) 04/10/2015  . Status post laparoscopic hysterectomy 01/03/2015  . Chronic female pelvic pain 11/23/2014    Past Surgical History  Procedure Laterality Date  . Appendectomy  2008  . Shoulder surgery  2001    right  . Cesarean section  2014  . Laparoscopic hysterectomy N/A 01/02/2015    Procedure: HYSTERECTOMY TOTAL LAPAROSCOPIC;  Surgeon: Osborne Oman, MD;  Location: [facility];  Service: Gynecology;  Laterality: N/A;  . Laparoscopic bilateral salpingectomy Bilateral 01/02/2015    Procedure: LAPAROSCOPIC BILATERAL SALPINGECTOMY;  Surgeon: Osborne Oman, MD;  Location: [facility];  Service:  Gynecology;  Laterality: Bilateral;  . Abdominal hysterectomy  March 2016    Due to uterus being stiched into C Section incision    Current Outpatient Rx  Name  Route  Sig  Dispense  Refill  . albuterol (PROVENTIL HFA;VENTOLIN HFA) 108 (90 BASE) MCG/ACT inhaler   Inhalation   Inhale 2 puffs into the lungs every 6 (six) hours as needed for wheezing or shortness of breath.         Marland Kitchen amoxicillin-clavulanate (AUGMENTIN) 875-125 MG tablet   Oral   Take 1 tablet by mouth every 12 (twelve) hours.   14 tablet   0   . Cyanocobalamin (RA VITAMIN B-12 TR) 1000 MCG TBCR   Oral   Take 1,000 mcg by mouth daily.         . DULoxetine (CYMBALTA) 30 MG capsule   Oral   Take 3 capsules (90 mg total) by mouth daily. One by mouth every morning for 8 days, then two every morning   90 capsule   5   . fexofenadine (ALLEGRA) 180 MG tablet   Oral   Take 180 mg by mouth daily.         . fluconazole (DIFLUCAN) 150 MG tablet   Oral   Take 1 tablet (150 mg total) by mouth daily.   1 tablet   1   . ibuprofen (ADVIL,MOTRIN) 800 MG tablet   Oral   Take 800 mg by mouth every 8 (eight) hours as needed.         Marland Kitchen  levothyroxine (SYNTHROID, LEVOTHROID) 75 MCG tablet   Oral   Take 1 tablet (75 mcg total) by mouth daily before breakfast.   30 tablet   11   . methocarbamol (ROBAXIN) 500 MG tablet   Oral   Take 1 tablet (500 mg total) by mouth every 8 (eight) hours as needed for muscle spasms. DO NOT DRIVE ON THIS MEDICINE, IT MAY MAKE YOU SLEEPY   60 tablet   1   . mometasone (NASONEX) 50 MCG/ACT nasal spray   Nasal   Place 2 sprays into the nose daily.         . montelukast (SINGULAIR) 10 MG tablet   Oral   Take 10 mg by mouth at bedtime.         . sulfacetamide (BLEPH-10) 10 % ophthalmic solution   Both Eyes   Place 2 drops into both eyes 4 (four) times daily.   5 mL   0   . SUMAtriptan (IMITREX) 100 MG tablet   Oral   Take 100 mg by mouth every 2 (two) hours as needed for  migraine. May repeat in 2 hours if headache persists or recurs.         . topiramate (TOPAMAX) 100 MG tablet   Oral   Take 0.5 tablets (50 mg total) by mouth daily. Never stop abruptly   15 tablet   3     Change in sig   . traZODone (DESYREL) 50 MG tablet   Oral   Take 1 tablet (50 mg total) by mouth at bedtime as needed for sleep.   30 tablet   2   . Vitamin D, Ergocalciferol, (DRISDOL) 50000 units CAPS capsule   Oral   Take 50,000 Units by mouth once a week.           Allergies Depakote; Morphine; Propoxyphene; Tramadol; and Valproic acid  Family History  Problem Relation Age of Onset  . Diabetes Father   . Osteoporosis Mother   . Cancer Maternal Grandmother     breast  . Osteoporosis Maternal Grandmother     Social History Social History  Substance Use Topics  . Smoking status: Former Smoker -- 0.25 packs/day for 4 years    Types: Cigarettes    Quit date: 04/17/2012  . Smokeless tobacco: Never Used  . Alcohol Use: No    Review of Systems Constitutional: No fever/chills Eyes: No visual changes.Positive for redness and discharge of the right eye  ENT: No sore throat.Positive for runny nose and sinus pressure  Cardiovascular: Denies chest pain. Respiratory: Denies shortness of breath. Genitourinary: Negative for dysuria. Musculoskeletal: Negative for back pain. Skin: Negative for rash. Neurological: Negative for headaches, focal weakness or numbness.  10-point ROS otherwise negative.  ____________________________________________   PHYSICAL EXAM:  VITAL SIGNS: ED Triage Vitals  Enc Vitals Group     BP 01/04/16 1230 109/58 mmHg     Pulse Rate 01/04/16 1230 73     Resp 01/04/16 1230 18     Temp 01/04/16 1230 98.3 F (36.8 C)     Temp Source 01/04/16 1230 Oral     SpO2 01/04/16 1230 100 %     Weight 01/04/16 1230 199 lb (90.266 kg)     Height 01/04/16 1230 5\' 8"  (1.727 m)     Head Cir --      Peak Flow --      Pain Score 01/04/16 1230 1      Pain Loc --      Pain  Edu? --      Excl. in [place]? --     Constitutional: Alert and oriented. Well appearing and in no acute distress. Eyes:Right eye with very erythematous conjunctivae with greenish discharge noted and matting. Left eye unremarkable.  Head: Atraumatic.Both right frontal and right maxillary sinus tenderness.  Nose:Positive nasal congestion with turbinate swelling bilaterally  Mouth/Throat: Mucous membranes are moist.  Oropharynx non-erythematous. Neck: No stridor.   Cardiovascular: Normal rate, regular rhythm. Grossly normal heart sounds.  Good peripheral circulation. Respiratory: Normal respiratory effort.  No retractions. Lungs CTAB. Neurologic:  Normal speech and language. No gross focal neurologic deficits are appreciated. No gait instability. Skin:  Skin is warm, dry and intact. No rash noted. Psychiatric: Mood and affect are normal. Speech and behavior are normal.  ____________________________________________   LABS (all labs ordered are listed, but only abnormal results are displayed)  Labs Reviewed - No data to display ____________________________________________    PROCEDURES  Procedure(s) performed: None  Critical Care performed: No  ____________________________________________   INITIAL IMPRESSION / ASSESSMENT AND PLAN / ED COURSE  Pertinent labs & imaging results that were available during my care of the patient were reviewed by me and considered in my medical decision making (see chart for details).  Acute right eye conjunctivitis. Acute sinusitis. Rx given for sodium Sulamyd eye drops, Augmentin 875 twice a day and Diflucan 150 mg by mouth 1. Work excuse given 48 hours patient follow-up with PCP or return to the ER with any worsening symptomology. Patient voices no other emergency medical complaints at this time.  ____________________________________________   FINAL CLINICAL IMPRESSION(S) / ED DIAGNOSES  Final diagnoses:  Conjunctivitis of  right eye  Acute recurrent maxillary sinusitis     This chart was dictated using voice recognition software/Dragon. Despite best efforts to proofread, errors can occur which can change the meaning. Any change was purely unintentional.   Arlyss Repress, PA-C 01/04/16 1307  Lisa Roca, MD 01/04/16 1350

## 2016-01-04 NOTE — Discharge Instructions (Signed)
Bacterial Conjunctivitis Bacterial conjunctivitis, commonly called pink eye, is an inflammation of the clear membrane that covers the white part of the eye (conjunctiva). The inflammation can also happen on the underside of the eyelids. The blood vessels in the conjunctiva become inflamed, causing the eye to become red or pink. Bacterial conjunctivitis may spread easily from one eye to another and from person to person (contagious).  CAUSES  Bacterial conjunctivitis is caused by bacteria. The bacteria may come from your own skin, your upper respiratory tract, or from someone else with bacterial conjunctivitis. SYMPTOMS  The normally white color of the eye or the underside of the eyelid is usually pink or red. The pink eye is usually associated with irritation, tearing, and some sensitivity to light. Bacterial conjunctivitis is often associated with a thick, yellowish discharge from the eye. The discharge may turn into a crust on the eyelids overnight, which causes your eyelids to stick together. If a discharge is present, there may also be some blurred vision in the affected eye. DIAGNOSIS  Bacterial conjunctivitis is diagnosed by your caregiver through an eye exam and the symptoms that you report. Your caregiver looks for changes in the surface tissues of your eyes, which may point to the specific type of conjunctivitis. A sample of any discharge may be collected on a cotton-tip swab if you have a severe case of conjunctivitis, if your cornea is affected, or if you keep getting repeat infections that do not respond to treatment. The sample will be sent to a lab to see if the inflammation is caused by a bacterial infection and to see if the infection will respond to antibiotic medicines. TREATMENT   Bacterial conjunctivitis is treated with antibiotics. Antibiotic eyedrops are most often used. However, antibiotic ointments are also available. Antibiotics pills are sometimes used. Artificial tears or eye  washes may ease discomfort. HOME CARE INSTRUCTIONS   To ease discomfort, apply a cool, clean washcloth to your eye for 10-20 minutes, 3-4 times a day.  Gently wipe away any drainage from your eye with a warm, wet washcloth or a cotton ball.  Wash your hands often with soap and water. Use paper towels to dry your hands.  Do not share towels or washcloths. This may spread the infection.  Change or wash your pillowcase every day.  You should not use eye makeup until the infection is gone.  Do not operate machinery or drive if your vision is blurred.  Stop using contact lenses. Ask your caregiver how to sterilize or replace your contacts before using them again. This depends on the type of contact lenses that you use.  When applying medicine to the infected eye, do not touch the edge of your eyelid with the eyedrop bottle or ointment tube. SEEK IMMEDIATE MEDICAL CARE IF:   Your infection has not improved within 3 days after beginning treatment.  You had yellow discharge from your eye and it returns.  You have increased eye pain.  Your eye redness is spreading.  Your vision becomes blurred.  You have a fever or persistent symptoms for more than 2-3 days.  You have a fever and your symptoms suddenly get worse.  You have facial pain, redness, or swelling. MAKE SURE YOU:   Understand these instructions.  Will watch your condition.  Will get help right away if you are not doing well or get worse.   This information is not intended to replace advice given to you by your health care provider. Make sure you   discuss any questions you have with your health care provider.   Document Released: 10/20/2005 Document Revised: 11/10/2014 Document Reviewed: 03/22/2012 Elsevier Interactive Patient Education 2016 Elsevier Inc.  Sinusitis, Adult Sinusitis is redness, soreness, and inflammation of the paranasal sinuses. Paranasal sinuses are air pockets within the bones of your face. They  are located beneath your eyes, in the middle of your forehead, and above your eyes. In healthy paranasal sinuses, mucus is able to drain out, and air is able to circulate through them by way of your nose. However, when your paranasal sinuses are inflamed, mucus and air can become trapped. This can allow bacteria and other germs to grow and cause infection. Sinusitis can develop quickly and last only a short time (acute) or continue over a long period (chronic). Sinusitis that lasts for more than 12 weeks is considered chronic. CAUSES Causes of sinusitis include:  Allergies.  Structural abnormalities, such as displacement of the cartilage that separates your nostrils (deviated septum), which can decrease the air flow through your nose and sinuses and affect sinus drainage.  Functional abnormalities, such as when the small hairs (cilia) that line your sinuses and help remove mucus do not work properly or are not present. SIGNS AND SYMPTOMS Symptoms of acute and chronic sinusitis are the same. The primary symptoms are pain and pressure around the affected sinuses. Other symptoms include:  Upper toothache.  Earache.  Headache.  Bad breath.  Decreased sense of smell and taste.  A cough, which worsens when you are lying flat.  Fatigue.  Fever.  Thick drainage from your nose, which often is green and may contain pus (purulent).  Swelling and warmth over the affected sinuses. DIAGNOSIS Your health care provider will perform a physical exam. During your exam, your health care provider may perform any of the following to help determine if you have acute sinusitis or chronic sinusitis:  Look in your nose for signs of abnormal growths in your nostrils (nasal polyps).  Tap over the affected sinus to check for signs of infection.  View the inside of your sinuses using an imaging device that has a light attached (endoscope). If your health care provider suspects that you have chronic  sinusitis, one or more of the following tests may be recommended:  Allergy tests.  Nasal culture. A sample of mucus is taken from your nose, sent to a lab, and screened for bacteria.  Nasal cytology. A sample of mucus is taken from your nose and examined by your health care provider to determine if your sinusitis is related to an allergy. TREATMENT Most cases of acute sinusitis are related to a viral infection and will resolve on their own within 10 days. Sometimes, medicines are prescribed to help relieve symptoms of both acute and chronic sinusitis. These may include pain medicines, decongestants, nasal steroid sprays, or saline sprays. However, for sinusitis related to a bacterial infection, your health care provider will prescribe antibiotic medicines. These are medicines that will help kill the bacteria causing the infection. Rarely, sinusitis is caused by a fungal infection. In these cases, your health care provider will prescribe antifungal medicine. For some cases of chronic sinusitis, surgery is needed. Generally, these are cases in which sinusitis recurs more than 3 times per year, despite other treatments. HOME CARE INSTRUCTIONS  Drink plenty of water. Water helps thin the mucus so your sinuses can drain more easily.  Use a humidifier.  Inhale steam 3-4 times a day (for example, sit in the bathroom with the  shower running).  Apply a warm, moist washcloth to your face 3-4 times a day, or as directed by your health care provider.  Use saline nasal sprays to help moisten and clean your sinuses.  Take medicines only as directed by your health care provider.  If you were prescribed either an antibiotic or antifungal medicine, finish it all even if you start to feel better. SEEK IMMEDIATE MEDICAL CARE IF:  You have increasing pain or severe headaches.  You have nausea, vomiting, or drowsiness.  You have swelling around your face.  You have vision problems.  You have a stiff  neck.  You have difficulty breathing.   This information is not intended to replace advice given to you by your health care provider. Make sure you discuss any questions you have with your health care provider.   Document Released: 10/20/2005 Document Revised: 11/10/2014 Document Reviewed: 11/04/2011 Elsevier Interactive Patient Education [employer].

## 2016-01-09 ENCOUNTER — Encounter: Payer: Self-pay | Admitting: Family Medicine

## 2016-01-09 DIAGNOSIS — F909 Attention-deficit hyperactivity disorder, unspecified type: Secondary | ICD-10-CM

## 2016-01-09 DIAGNOSIS — M797 Fibromyalgia: Secondary | ICD-10-CM

## 2016-01-09 HISTORY — DX: Attention-deficit hyperactivity disorder, unspecified type: F90.9

## 2016-01-09 HISTORY — DX: Fibromyalgia: M79.7

## 2016-01-09 NOTE — Assessment & Plan Note (Signed)
Recommended Answers to Distraction by [place] and [place]; she may return if interested in starting medicine; discussed [place] strategy; see AVS

## 2016-01-09 NOTE — Assessment & Plan Note (Signed)
So glad she has improved so much with the SNRI therapy; continue same dose; get adequate rest, cut down sugars

## 2016-01-23 ENCOUNTER — Ambulatory Visit: Payer: BLUE CROSS/BLUE SHIELD | Admitting: Family Medicine

## 2016-01-28 ENCOUNTER — Ambulatory Visit (INDEPENDENT_AMBULATORY_CARE_PROVIDER_SITE_OTHER): Payer: Self-pay | Admitting: Family Medicine

## 2016-01-28 ENCOUNTER — Encounter: Payer: Self-pay | Admitting: Family Medicine

## 2016-01-28 VITALS — BP 104/73 | HR 84 | Temp 97.6°F | Wt 206.0 lb

## 2016-01-28 DIAGNOSIS — E669 Obesity, unspecified: Secondary | ICD-10-CM

## 2016-01-28 DIAGNOSIS — F909 Attention-deficit hyperactivity disorder, unspecified type: Secondary | ICD-10-CM

## 2016-01-28 DIAGNOSIS — E039 Hypothyroidism, unspecified: Secondary | ICD-10-CM

## 2016-01-28 DIAGNOSIS — Z79899 Other long term (current) drug therapy: Secondary | ICD-10-CM

## 2016-01-28 MED ORDER — AMPHETAMINE-DEXTROAMPHETAMINE 10 MG PO TABS: 10.0000 mg | ORAL_TABLET | Freq: Two times a day (BID) | ORAL | Status: DC

## 2016-01-28 NOTE — Assessment & Plan Note (Addendum)
Controlled substance contract reviewed and signed, copy given to her; start Adderall short-acting; reassess in 4 weeks; typical speech given

## 2016-01-28 NOTE — Progress Notes (Signed)
BP 104/73 mmHg  Pulse 84  Temp(Src) 97.6 F (36.4 C)  Wt 206 lb (93.441 kg)  SpO2 98%  LMP 10/30/2014   Subjective:    Patient ID: Renee Benjamin, female    DOB: 06-08-1981, 35 y.o.   MRN: [id number]  HPI: Renee Benjamin is a 35 y.o. female  Chief Complaint  Patient presents with  . ADHD    discuss starting meds   She had a sinus infection March 3rd and pink eye; both issues resolved; she has two young children  She is here to discuss starting ADHD medicine; she says that her teacher moved her to the back of the class because she was moving her legs, tapping her pencil; "ooh, there's a squirrel"; she did not have as much noticeable problems until starting class, and she realizes it just really affects her ability to stay on task; has 15 projects going on at home; hard to complete things, "oh I need to go do this", "everything is halfway" Diagnosed with ADHD at age 49 or 89; she was on Ritalin; and then doctor wanted her to take [place] he was selling; the Ritalin was okay but she felt like it was too much; she was on Adderall in college and did really well with it; can't take the XR version because she can't sleep She is aware that this is not being used to lose weight; she says she realizes this is for school, "my teacher is going to kick me out"  Relevant past medical, family history reviewed Past Medical History  Diagnosis Date  . Allergy   . Asthma   . Endometriosis   . Polycystic ovarian disease   . Hypothyroidism   . Headache     otc med prn  . Anemia   . Vitamin D deficiency disease   . Thoracic back pain   . Anxiety     separation anxiety  . ADHD (attention deficit hyperactivity disorder) 01/09/2016  . Fibromyalgia 01/09/2016   Family History  Problem Relation Age of Onset  . Diabetes Father   . Osteoporosis Mother   . Cancer Maternal Grandmother     breast  . Osteoporosis Maternal Grandmother    Interim medical history since our last visit  reviewed. Allergies and medications reviewed and updated.  Review of Systems Per HPI unless specifically indicated above     Objective:    BP 104/73 mmHg  Pulse 84  Temp(Src) 97.6 F (36.4 C)  Wt 206 lb (93.441 kg)  SpO2 98%  LMP 10/30/2014  Wt Readings from Last 3 Encounters:  01/28/16 206 lb (93.441 kg)  01/04/16 199 lb (90.266 kg)  01/02/16 199 lb (90.266 kg)   body mass index is 31.33 kg/(m^2).  Physical Exam  Constitutional: She appears well-developed and well-nourished.  Obese  Eyes: EOM are normal.  Cardiovascular: Normal rate and regular rhythm.   No extrasystoles are present.  Pulmonary/Chest: Effort normal and breath sounds normal.  Neurological: She displays no tremor.  No tics  Psychiatric: She has a normal mood and affect. Her behavior is normal. Judgment and thought content normal. Her mood appears not anxious. Her speech is not rapid and/or pressured. Cognition and memory are normal.      Assessment & Plan:   Problem List Items Addressed This Visit      Endocrine   Hypothyroidism    Weight gain noted; last TSH reviewed; next due June 2017        Other  ADHD (attention deficit hyperactivity disorder) - Primary    Controlled substance contract reviewed and signed, copy given to her; start Adderall short-acting; reassess in 4 weeks; typical speech given      Controlled substance agreement signed    Controlled substance contract given to patient, reviewed, signed, copy given to her      Obesity    Weight gain noted; will check TSH in June, sooner if weight continues to climb; I explained that we are NOT using ADHD medicine for weight management and that dose will be titrated to ADHD symptoms      Relevant Medications   amphetamine-dextroamphetamine (ADDERALL) 10 MG tablet       Follow up plan: Return in about 4 weeks (around 02/25/2016) for medicine follow-up.   Meds ordered this encounter  Medications  . amphetamine-dextroamphetamine  (ADDERALL) 10 MG tablet    Sig: Take 1 tablet (10 mg total) by mouth 2 (two) times daily. 2nd pill 4-6 hours after 1st pill    Dispense:  60 tablet    Refill:  0   An after-visit summary was printed and given to the patient at [place].  Please see the patient instructions which may contain other information and recommendations beyond what is mentioned above in the assessment and plan.

## 2016-01-28 NOTE — Patient Instructions (Addendum)
Start new medicine Call with any problems or side effects (palpitations, fast heart beat, headaches, appetite suppression, etc.) Return in 4 weeks for reassessment at [facility]

## 2016-02-01 DIAGNOSIS — Z79899 Other long term (current) drug therapy: Secondary | ICD-10-CM | POA: Insufficient documentation

## 2016-02-01 DIAGNOSIS — E669 Obesity, unspecified: Secondary | ICD-10-CM | POA: Insufficient documentation

## 2016-02-01 NOTE — Assessment & Plan Note (Signed)
Controlled substance contract given to patient, reviewed, signed, copy given to her

## 2016-02-01 NOTE — Assessment & Plan Note (Signed)
Weight gain noted; will check TSH in June, sooner if weight continues to climb; I explained that we are NOT using ADHD medicine for weight management and that dose will be titrated to ADHD symptoms

## 2016-02-01 NOTE — Assessment & Plan Note (Signed)
Weight gain noted; last TSH reviewed; next due June 2017

## 2016-02-04 ENCOUNTER — Other Ambulatory Visit: Payer: Self-pay | Admitting: Family Medicine

## 2016-02-04 MED ORDER — DULOXETINE HCL 30 MG PO CPEP: 90.0000 mg | ORAL_CAPSULE | Freq: Every day | ORAL | Status: DC

## 2016-02-04 NOTE — Telephone Encounter (Signed)
She should be on 90 mg daily

## 2016-02-13 ENCOUNTER — Other Ambulatory Visit: Payer: Self-pay | Admitting: Family Medicine

## 2016-02-13 NOTE — Telephone Encounter (Signed)
Rx sent 

## 2016-03-18 ENCOUNTER — Other Ambulatory Visit: Payer: Self-pay | Admitting: Family Medicine

## 2016-04-08 ENCOUNTER — Telehealth: Payer: Self-pay | Admitting: Family Medicine

## 2016-04-08 MED ORDER — PREGABALIN 75 MG PO CAPS: 75.0000 mg | ORAL_CAPSULE | Freq: Two times a day (BID) | ORAL | Status: DC

## 2016-04-08 NOTE — Telephone Encounter (Signed)
I spoke with patient; her fibro is getting worse; from hips to her knees, feels like so much pain and burning; just has to stay in bed; nothing wrong in her back; nothing out of the ordinary; she has done motrin and muscle relaxers Will start lyrica which she has not tried yet; call me if needed Rx called in to pharmacist

## 2016-04-08 NOTE — Telephone Encounter (Signed)
You are completely booked and patient is really needing a sooner due to fibromyalgia flare up which is really bad today. Please advise.

## 2016-04-10 ENCOUNTER — Ambulatory Visit (INDEPENDENT_AMBULATORY_CARE_PROVIDER_SITE_OTHER): Payer: BLUE CROSS/BLUE SHIELD | Admitting: Family Medicine

## 2016-04-10 ENCOUNTER — Ambulatory Visit
Admission: RE | Admit: 2016-04-10 | Discharge: 2016-04-10 | Disposition: A | Discharge status: Home / Self Care | Payer: BLUE CROSS/BLUE SHIELD | Source: Ambulatory Visit | Attending: Family Medicine | Admitting: Family Medicine

## 2016-04-10 ENCOUNTER — Encounter: Payer: Self-pay | Admitting: Family Medicine

## 2016-04-10 VITALS — BP 122/74 | HR 99 | Temp 98.4°F | Resp 16 | Wt 210.0 lb

## 2016-04-10 DIAGNOSIS — M797 Fibromyalgia: Secondary | ICD-10-CM | POA: Diagnosis not present

## 2016-04-10 DIAGNOSIS — G8929 Other chronic pain: Secondary | ICD-10-CM | POA: Insufficient documentation

## 2016-04-10 DIAGNOSIS — M25551 Pain in right hip: Secondary | ICD-10-CM | POA: Diagnosis present

## 2016-04-10 DIAGNOSIS — T887XXA Unspecified adverse effect of drug or medicament, initial encounter: Secondary | ICD-10-CM

## 2016-04-10 DIAGNOSIS — T50905A Adverse effect of unspecified drugs, medicaments and biological substances, initial encounter: Secondary | ICD-10-CM

## 2016-04-10 HISTORY — DX: Other chronic pain: G89.29

## 2016-04-10 MED ORDER — IBUPROFEN 800 MG PO TABS: 800.0000 mg | ORAL_TABLET | Freq: Three times a day (TID) | ORAL | Status: DC | PRN

## 2016-04-10 MED ORDER — OXYCODONE-ACETAMINOPHEN 5-325 MG PO TABS: 1.0000 | ORAL_TABLET | Freq: Four times a day (QID) | ORAL | Status: DC | PRN

## 2016-04-10 NOTE — Assessment & Plan Note (Addendum)
Will get xrays today and treat with 800 mg ibuprofen around-the-clock for five days, then prn; also may use oxycodone for significant pain; sounds as though she may have advanced cartilage loss or arthritis related to childhood hip issues

## 2016-04-10 NOTE — Assessment & Plan Note (Addendum)
This acute pain in the right hip does not sound like flare of fibro; side effects from lyrica; STOP the lyrica for now; could consider just using at night if needed in future, then adding lower daytime dose if needed; continue cymbalta

## 2016-04-10 NOTE — Progress Notes (Signed)
BP 122/74 mmHg  Pulse 99  Temp(Src) 98.4 F (36.9 C) (Oral)  Resp 16  Wt 210 lb (95.255 kg)  SpO2 96%  LMP 10/30/2014   Subjective:    Patient ID: Renee Benjamin, female    DOB: [birth year], 35 y.o.   MRN: [id number]  HPI: Renee Benjamin is a 35 y.o. female  Chief Complaint  Patient presents with  . Hip Pain    onset 4 days right  . Medication Refill   Patient is well-known to me; see previous phone call; she describes pain in the right hip; not typical for fibro flare The Lyrica made her fall asleep at work yesterday Pain is a 7 or 8 out of 10 Location: deep in the right hip, down into the butt; feels like where the leg comes into the joint No fevers No weakness of the leg No loss of control of B/B No rash Hx of congenital hip dysplasia or something, was in the frog-leg braces for 6 months; obviously, doesn't know much; popped right hip out of socket at age 58 and they told her she had just minimal cartilage It has been bone on bone for a few years rubbing together She has ibuprofen but not taking right now Has tolerated percocet in the past when I asked (reviewed allergies)  Depression screen [facility] 2/9 04/10/2016 07/13/2015  Decreased Interest 0 0  Down, Depressed, Hopeless 0 0  PHQ - 2 Score 0 0   Relevant past medical, surgical, family and social history reviewed Past Medical History  Diagnosis Date  . Allergy   . Asthma   . Endometriosis   . Polycystic ovarian disease   . Hypothyroidism   . Headache     otc med prn  . Anemia   . Vitamin D deficiency disease   . Thoracic back pain   . Anxiety     separation anxiety  . ADHD (attention deficit hyperactivity disorder) 01/09/2016  . Fibromyalgia 01/09/2016  . Chronic right hip pain 04/10/2016   Past Surgical History  Procedure Laterality Date  . Appendectomy  2008  . Shoulder surgery  2001    right  . Cesarean section  2014  . Laparoscopic hysterectomy N/A 01/02/2015    Procedure: HYSTERECTOMY TOTAL  LAPAROSCOPIC;  Surgeon: Osborne Oman, MD;  Location: [facility];  Service: Gynecology;  Laterality: N/A;  . Laparoscopic bilateral salpingectomy Bilateral 01/02/2015    Procedure: LAPAROSCOPIC BILATERAL SALPINGECTOMY;  Surgeon: Osborne Oman, MD;  Location: [facility];  Service: Gynecology;  Laterality: Bilateral;  . Abdominal hysterectomy  March 2016    Due to uterus being stiched into C Section incision   Social History  Substance Use Topics  . Smoking status: Former Smoker -- 0.25 packs/day for 4 years    Types: Cigarettes    Quit date: 04/17/2012  . Smokeless tobacco: Never Used  . Alcohol Use: No   Interim medical history since last visit reviewed. Allergies and medications reviewed  Review of Systems Per HPI unless specifically indicated above     Objective:    BP 122/74 mmHg  Pulse 99  Temp(Src) 98.4 F (36.9 C) (Oral)  Resp 16  Wt 210 lb (95.255 kg)  SpO2 96%  LMP 10/30/2014  Wt Readings from Last 3 Encounters:  04/10/16 210 lb (95.255 kg)  01/28/16 206 lb (93.441 kg)  01/04/16 199 lb (90.266 kg)    Physical Exam  Constitutional: She appears well-developed and well-nourished. No distress (no distress, but is in  obvious discomfort when moving, getting up off exam table, doing ROM testing).  Cardiovascular: Normal rate and regular rhythm.   Pulmonary/Chest: Effort normal and breath sounds normal.  Musculoskeletal: She exhibits no edema.       Right hip: She exhibits decreased range of motion and tenderness. She exhibits no bony tenderness, no swelling, no crepitus and no deformity.       Lumbar back: She exhibits normal range of motion, no tenderness, no swelling, no edema, no deformity and no spasm.  Limited ROM with right hip flexion, pain produced with external rotation > internal rotation; some pain with extension of the hip as well; no pain with palpation across the right SI joint; no pain with palpation over the thigh muscles  Neurological: She displays no atrophy  and no tremor. Gait (slight antalgia) abnormal.  Reflex Scores:      Patellar reflexes are 2+ on the right side and 2+ on the left side. LE strength 5/5  Psychiatric: She has a normal mood and affect.      Assessment & Plan:   Problem List Items Addressed This Visit      Musculoskeletal and Integument   Fibromyalgia    This acute pain in the right hip does not sound like flare of fibro; side effects from lyrica; STOP the lyrica for now; could consider just using at night if needed in future, then adding lower daytime dose if needed; continue cymbalta      Relevant Medications   ibuprofen (ADVIL,MOTRIN) 800 MG tablet     Other   Chronic right hip pain - Primary    Will get xrays today and treat with 800 mg ibuprofen around-the-clock for five days, then prn; also may use oxycodone for significant pain; sounds as though she may have advanced cartilage loss or arthritis related to childhood hip issues      Relevant Orders   DG HIP UNILAT W OR W/O PELVIS 2-3 VIEWS RIGHT (Completed)    Other Visit Diagnoses    Medication side effect, initial encounter        somnolence to Lyrica; STOP Lyrica; won't go back on for now; consider using just at night for 1 week, then increase to BID if needed for fibro in future       Follow up plan: No Follow-up on file.  An after-visit summary was printed and given to the patient at [facility].  Please see the patient instructions which may contain other information and recommendations beyond what is mentioned above in the assessment and plan.  Meds ordered this encounter  Medications  . ibuprofen (ADVIL,MOTRIN) 800 MG tablet    Sig: Take 1 tablet (800 mg total) by mouth every 8 (eight) hours as needed.    Dispense:  30 tablet    Refill:  1  . oxyCODONE-acetaminophen (ROXICET) 5-325 MG tablet    Sig: Take 1 tablet by mouth every 6 (six) hours as needed for severe pain.    Dispense:  20 tablet    Refill:  0    Writing on prescription pad     Orders Placed This Encounter  Procedures  . DG HIP UNILAT W OR W/O PELVIS 2-3 VIEWS RIGHT

## 2016-04-10 NOTE — Patient Instructions (Signed)
Stop Lyrica Let's get xrays across the street today Start the ibuprofen and take regularly for the next five days (with food, risk of gastritis, GI bleed), then after the five days, just as needed Use the percocet (oxycodone/apap) when needed Never mix the pain medicine with alcohol or nerve pills or sleeping pills Call if getting worse

## 2016-04-11 ENCOUNTER — Telehealth: Payer: Self-pay | Admitting: Family Medicine

## 2016-04-15 ENCOUNTER — Ambulatory Visit: Payer: BLUE CROSS/BLUE SHIELD | Admitting: Family Medicine

## 2016-04-17 ENCOUNTER — Other Ambulatory Visit: Payer: Self-pay | Admitting: Family Medicine

## 2016-04-17 ENCOUNTER — Encounter: Payer: Self-pay | Admitting: Family Medicine

## 2016-04-17 DIAGNOSIS — M545 Low back pain, unspecified: Secondary | ICD-10-CM

## 2016-04-17 DIAGNOSIS — M25551 Pain in right hip: Secondary | ICD-10-CM

## 2016-04-17 DIAGNOSIS — G8929 Other chronic pain: Secondary | ICD-10-CM

## 2016-04-18 ENCOUNTER — Other Ambulatory Visit: Payer: Self-pay | Admitting: Family Medicine

## 2016-04-19 ENCOUNTER — Other Ambulatory Visit: Payer: Self-pay | Admitting: Family Medicine

## 2016-04-19 MED ORDER — METHOCARBAMOL 500 MG PO TABS: 500.0000 mg | ORAL_TABLET | Freq: Three times a day (TID) | ORAL | Status: DC | PRN

## 2016-04-19 MED ORDER — IBUPROFEN 800 MG PO TABS: 800.0000 mg | ORAL_TABLET | Freq: Three times a day (TID) | ORAL | Status: DC | PRN

## 2016-04-19 NOTE — Assessment & Plan Note (Signed)
Refer to ortho.

## 2016-04-19 NOTE — Assessment & Plan Note (Signed)
rfer to  ortho

## 2016-04-19 NOTE — Telephone Encounter (Signed)
Refer to ortho.

## 2016-04-25 ENCOUNTER — Telehealth: Payer: Self-pay | Admitting: Family Medicine

## 2016-04-25 NOTE — Telephone Encounter (Signed)
Message read to patient and she understands

## 2016-04-25 NOTE — Telephone Encounter (Signed)
If she's hurting this badly, I'll suggest she go to urgent care, just to make sure nothing else going on; I'm sorry we are booked, but urgent care has same day/walk-in appointments

## 2016-04-25 NOTE — Telephone Encounter (Signed)
Patient called wanting to be seen today for fibromyalgia flare up. States she is in severe pain and that she can hardly move. I informed her that we are completely booked but I would place her on the cancellation list along with informing her doctor what is going on. Only need about a 15 minute notice to come in

## 2016-05-06 ENCOUNTER — Emergency Department
Admission: EM | Admit: 2016-05-06 | Discharge: 2016-05-06 | Disposition: A | Discharge status: Home / Self Care | Payer: BLUE CROSS/BLUE SHIELD | Attending: Student | Admitting: Student

## 2016-05-06 ENCOUNTER — Encounter: Payer: Self-pay | Admitting: Emergency Medicine

## 2016-05-06 DIAGNOSIS — Z87891 Personal history of nicotine dependence: Secondary | ICD-10-CM | POA: Insufficient documentation

## 2016-05-06 DIAGNOSIS — J3089 Other allergic rhinitis: Secondary | ICD-10-CM | POA: Diagnosis present

## 2016-05-06 DIAGNOSIS — J301 Allergic rhinitis due to pollen: Secondary | ICD-10-CM

## 2016-05-06 DIAGNOSIS — Z79899 Other long term (current) drug therapy: Secondary | ICD-10-CM | POA: Insufficient documentation

## 2016-05-06 DIAGNOSIS — E039 Hypothyroidism, unspecified: Secondary | ICD-10-CM | POA: Diagnosis not present

## 2016-05-06 DIAGNOSIS — F909 Attention-deficit hyperactivity disorder, unspecified type: Secondary | ICD-10-CM | POA: Diagnosis not present

## 2016-05-06 MED ORDER — PREDNISONE 10 MG PO TABS: ORAL_TABLET | ORAL | Status: DC

## 2016-05-06 NOTE — ED Notes (Signed)
States she has been having some issues with sinus/allergies but woke up with deep dry cough  States cough is occassionaly prod with green phlegm

## 2016-05-06 NOTE — Discharge Instructions (Signed)
Allergic Rhinitis Allergic rhinitis is when the mucous membranes in the nose respond to allergens. Allergens are particles in the air that cause your body to have an allergic reaction. This causes you to release allergic antibodies. Through a chain of events, these eventually cause you to release histamine into the blood stream. Although meant to protect the body, it is this release of histamine that causes your discomfort, such as frequent sneezing, congestion, and an itchy, runny nose.  CAUSES Seasonal allergic rhinitis (hay fever) is caused by pollen allergens that may come from grasses, trees, and weeds. Year-round allergic rhinitis (perennial allergic rhinitis) is caused by allergens such as house dust mites, pet dander, and mold spores. SYMPTOMS  Nasal stuffiness (congestion).  Itchy, runny nose with sneezing and tearing of the eyes. DIAGNOSIS Your health care provider can help you determine the allergen or allergens that trigger your symptoms. If you and your health care provider are unable to determine the allergen, skin or blood testing may be used. Your health care provider will diagnose your condition after taking your health history and performing a physical exam. Your health care provider may assess you for other related conditions, such as asthma, pink eye, or an ear infection. TREATMENT Allergic rhinitis does not have a cure, but it can be controlled by:  Medicines that block allergy symptoms. These may include allergy shots, nasal sprays, and oral antihistamines.  Avoiding the allergen. Hay fever may often be treated with antihistamines in pill or nasal spray forms. Antihistamines block the effects of histamine. There are over-the-counter medicines that may help with nasal congestion and swelling around the eyes. Check with your health care provider before taking or giving this medicine. If avoiding the allergen or the medicine prescribed do not work, there are many new medicines  your health care provider can prescribe. Stronger medicine may be used if initial measures are ineffective. Desensitizing injections can be used if medicine and avoidance does not work. Desensitization is when a patient is given ongoing shots until the body becomes less sensitive to the allergen. Make sure you follow up with your health care provider if problems continue. HOME CARE INSTRUCTIONS It is not possible to completely avoid allergens, but you can reduce your symptoms by taking steps to limit your exposure to them. It helps to know exactly what you are allergic to so that you can avoid your specific triggers. SEEK MEDICAL CARE IF:  You have a fever.  You develop a cough that does not stop easily (persistent).  You have shortness of breath.  You start wheezing.  Symptoms interfere with normal daily activities.   This information is not intended to replace advice given to you by your health care provider. Make sure you discuss any questions you have with your health care provider.   Document Released: 07/15/2001 Document Revised: 11/10/2014 Document Reviewed: 06/27/2013 Elsevier Interactive Patient Education 2016 [employer].   Take prednisone as directed and continue your regular allergy medication. Follow-up with your primary care doctor if any continued problems.

## 2016-05-06 NOTE — ED Notes (Signed)
States she woke up with cough and congestion and fullness in ears. NAD

## 2016-05-06 NOTE — ED Provider Notes (Signed)
[facility] Emergency Department Provider Note  ____________________________________________  Time seen: Approximately 9:36 AM  I have reviewed the triage vital signs and the nursing notes.   HISTORY  Chief Complaint Nasal Congestion   HPI Renee Benjamin is a 35 y.o. female is here with complaint of allergies and nasal congestion. Patient states she has also had a productive cough but denies any fever or chills. She states she is highly allergic to lots of things and that 3 of her neighbors mode there yards yesterday increasing her nasal congestion. She continues to take her Singulair nasal and Allegra for her allergies. Other than her nasal congestion she denies any difficulty breathing.She rates her pain as 6/10.   Past Medical History  Diagnosis Date  . Allergy   . Asthma   . Endometriosis   . Polycystic ovarian disease   . Hypothyroidism   . Headache     otc med prn  . Anemia   . Vitamin D deficiency disease   . Thoracic back pain   . Anxiety     separation anxiety  . ADHD (attention deficit hyperactivity disorder) 01/09/2016  . Fibromyalgia 01/09/2016  . Chronic right hip pain 04/10/2016    Patient Active Problem List   Diagnosis Date Noted  . Chronic right hip pain 04/10/2016  . Controlled substance agreement signed 02/01/2016  . Obesity 02/01/2016  . ADHD (attention deficit hyperactivity disorder) 01/09/2016  . Fibromyalgia 01/09/2016  . Torticollis, acute 10/08/2015  . Headache 10/02/2015  . Thoracic back pain 10/02/2015  . Lumbar pain 10/02/2015  . Arthralgia 10/02/2015  . Chronic tonsillitis 08/20/2015  . Migraine headache with aura 04/10/2015  . Hypothyroidism 04/10/2015  . PTSD (post-traumatic stress disorder) 04/10/2015  . Status post laparoscopic hysterectomy 01/03/2015  . Chronic female pelvic pain 11/23/2014    Past Surgical History  Procedure Laterality Date  . Appendectomy  2008  . Shoulder surgery  2001    right   . Cesarean section  2014  . Laparoscopic hysterectomy N/A 01/02/2015    Procedure: HYSTERECTOMY TOTAL LAPAROSCOPIC;  Surgeon: Osborne Oman, MD;  Location: [facility];  Service: Gynecology;  Laterality: N/A;  . Laparoscopic bilateral salpingectomy Bilateral 01/02/2015    Procedure: LAPAROSCOPIC BILATERAL SALPINGECTOMY;  Surgeon: Osborne Oman, MD;  Location: [facility];  Service: Gynecology;  Laterality: Bilateral;  . Abdominal hysterectomy  March 2016    Due to uterus being stiched into C Section incision    Current Outpatient Rx  Name  Route  Sig  Dispense  Refill  . albuterol (PROVENTIL HFA;VENTOLIN HFA) 108 (90 BASE) MCG/ACT inhaler   Inhalation   Inhale 2 puffs into the lungs every 6 (six) hours as needed for wheezing or shortness of breath.         . amphetamine-dextroamphetamine (ADDERALL) 10 MG tablet   Oral   Take 1 tablet (10 mg total) by mouth 2 (two) times daily. 2nd pill 4-6 hours after 1st pill   60 tablet   0   . Cyanocobalamin (RA VITAMIN B-12 TR) 1000 MCG TBCR   Oral   Take 1,000 mcg by mouth daily.         . DULoxetine (CYMBALTA) 30 MG capsule   Oral   Take 3 capsules (90 mg total) by mouth daily.   90 capsule   5     Use this one instead   . fexofenadine (ALLEGRA) 180 MG tablet   Oral   Take 180 mg by mouth daily.         Marland Kitchen  ibuprofen (ADVIL,MOTRIN) 800 MG tablet   Oral   Take 1 tablet (800 mg total) by mouth every 8 (eight) hours as needed.   30 tablet   0   . levothyroxine (SYNTHROID, LEVOTHROID) 75 MCG tablet   Oral   Take 1 tablet (75 mcg total) by mouth daily before breakfast.   30 tablet   11   . mometasone (NASONEX) 50 MCG/ACT nasal spray   Nasal   Place 2 sprays into the nose daily.         . montelukast (SINGULAIR) 10 MG tablet      take 1 tablet by mouth once daily   30 tablet   9   . predniSONE (DELTASONE) 10 MG tablet      Take 3 tablets once a day for 3 days.   9 tablet   0   . SUMAtriptan (IMITREX) 100 MG tablet    Oral   Take 100 mg by mouth every 2 (two) hours as needed for migraine. May repeat in 2 hours if headache persists or recurs.         . topiramate (TOPAMAX) 100 MG tablet   Oral   Take 0.5 tablets (50 mg total) by mouth daily. Never stop abruptly   15 tablet   3     Change in sig   . traZODone (DESYREL) 50 MG tablet      take 1 tablet by mouth at bedtime if needed for sleep   30 tablet   5   . Vitamin D, Ergocalciferol, (DRISDOL) 50000 units CAPS capsule   Oral   Take 50,000 Units by mouth once a week.           Allergies Depakote; Morphine; Propoxyphene; Tramadol; and Valproic acid  Family History  Problem Relation Age of Onset  . Diabetes Father   . Osteoporosis Mother   . Cancer Maternal Grandmother     breast  . Osteoporosis Maternal Grandmother     Social History Social History  Substance Use Topics  . Smoking status: Former Smoker -- 0.25 packs/day for 4 years    Types: Cigarettes    Quit date: 04/17/2012  . Smokeless tobacco: Never Used  . Alcohol Use: No    Review of Systems Constitutional: No fever/chills Eyes: No visual changes. ENT: No sore throat.Positive nasal congestion. Cardiovascular: Denies chest pain. Respiratory: Denies shortness of breath. Positive productive cough. Gastrointestinal: No abdominal pain.  No nausea, no vomiting.   Musculoskeletal: Negative for back pain. Skin: Negative for rash. Neurological: Negative for headaches, focal weakness or numbness.  10-point ROS otherwise negative.  ____________________________________________   PHYSICAL EXAM:  VITAL SIGNS: ED Triage Vitals  Enc Vitals Group     BP 05/06/16 0900 123/90 mmHg     Pulse Rate 05/06/16 0900 77     Resp 05/06/16 0900 18     Temp 05/06/16 0900 98 F (36.7 C)     Temp Source 05/06/16 0900 Oral     SpO2 05/06/16 0900 97 %     Weight 05/06/16 0900 205 lb (92.987 kg)     Height 05/06/16 0900 5\' 8"  (1.727 m)     Head Cir --      Peak Flow --      Pain  Score 05/06/16 0900 6     Pain Loc --      Pain Edu? --      Excl. in [place]? --     Constitutional: Alert and oriented. Well appearing and in  no acute distress. Eyes: Conjunctivae are normal. PERRL. EOMI. Head: Atraumatic. Nose: Moderate congestion/no rhinnorhea.  EACs and TMs are clear bilaterally. Mouth/Throat: Mucous membranes are moist.  Oropharynx non-erythematous.Moderate posterior drainage. Neck: No stridor.   Hematological/Lymphatic/Immunilogical: No cervical lymphadenopathy. Cardiovascular: Normal rate, regular rhythm. Grossly normal heart sounds.  Good peripheral circulation. Respiratory: Normal respiratory effort.  No retractions. Lungs CTAB. Musculoskeletal: No lower extremity tenderness nor edema.  No joint effusions. Neurologic:  Normal speech and language. No gross focal neurologic deficits are appreciated. No gait instability. Skin:  Skin is warm, dry and intact. No rash noted. Psychiatric: Mood and affect are normal. Speech and behavior are normal.  ____________________________________________   LABS (all labs ordered are listed, but only abnormal results are displayed)  Labs Reviewed - No data to display   PROCEDURES  Procedure(s) performed: None  Procedures  Critical Care performed: No  ____________________________________________   INITIAL IMPRESSION / ASSESSMENT AND PLAN / ED COURSE  Pertinent labs & imaging results that were available during my care of the patient were reviewed by me and considered in my medical decision making (see chart for details).  Patient is to continue her regular allergy medication and also start prednisone 30 mg for 3 days. She is to follow-up with her primary care doctor if any continued problems with her allergies. ____________________________________________   FINAL CLINICAL IMPRESSION(S) / ED DIAGNOSES  Final diagnoses:  Other allergic rhinitis  Allergic rhinitis due to pollen      NEW MEDICATIONS STARTED DURING  THIS VISIT:  New Prescriptions   PREDNISONE (DELTASONE) 10 MG TABLET    Take 3 tablets once a day for 3 days.     Note:  This document was prepared using Dragon voice recognition software and may include unintentional dictation errors.    Johnn Hai, PA-C 05/06/16 [id number]  Joanne Gavel, MD 05/06/16 [id number]

## 2016-05-23 ENCOUNTER — Ambulatory Visit: Payer: BLUE CROSS/BLUE SHIELD | Admitting: Family Medicine

## 2016-06-17 NOTE — Telephone Encounter (Signed)
completed

## 2016-06-18 ENCOUNTER — Encounter: Payer: Self-pay | Admitting: Emergency Medicine

## 2016-07-17 ENCOUNTER — Other Ambulatory Visit: Payer: Self-pay

## 2016-07-17 DIAGNOSIS — E039 Hypothyroidism, unspecified: Secondary | ICD-10-CM

## 2016-07-17 MED ORDER — LEVOTHYROXINE SODIUM 75 MCG PO TABS: 75.0000 ug | ORAL_TABLET | Freq: Every day | ORAL | 0 refills | Status: DC

## 2016-07-17 NOTE — Assessment & Plan Note (Signed)
Due for recheck lab

## 2016-07-17 NOTE — Telephone Encounter (Signed)
Please ask pt to have a TSH drawn in the next few weeks I sent Rx but it's time to make sure dose is still okay Thank you

## 2016-07-18 NOTE — Telephone Encounter (Signed)
Left detailed voicemail

## 2016-07-22 ENCOUNTER — Emergency Department: Payer: BLUE CROSS/BLUE SHIELD

## 2016-07-22 ENCOUNTER — Emergency Department
Admission: EM | Admit: 2016-07-22 | Discharge: 2016-07-22 | Disposition: A | Discharge status: Home / Self Care | Payer: BLUE CROSS/BLUE SHIELD | Attending: Emergency Medicine | Admitting: Emergency Medicine

## 2016-07-22 ENCOUNTER — Encounter: Payer: Self-pay | Admitting: Emergency Medicine

## 2016-07-22 DIAGNOSIS — Z87891 Personal history of nicotine dependence: Secondary | ICD-10-CM | POA: Diagnosis not present

## 2016-07-22 DIAGNOSIS — Z79899 Other long term (current) drug therapy: Secondary | ICD-10-CM | POA: Insufficient documentation

## 2016-07-22 DIAGNOSIS — Z791 Long term (current) use of non-steroidal anti-inflammatories (NSAID): Secondary | ICD-10-CM | POA: Diagnosis not present

## 2016-07-22 DIAGNOSIS — F909 Attention-deficit hyperactivity disorder, unspecified type: Secondary | ICD-10-CM | POA: Insufficient documentation

## 2016-07-22 DIAGNOSIS — J45909 Unspecified asthma, uncomplicated: Secondary | ICD-10-CM | POA: Diagnosis not present

## 2016-07-22 DIAGNOSIS — R1011 Right upper quadrant pain: Secondary | ICD-10-CM | POA: Diagnosis present

## 2016-07-22 DIAGNOSIS — R109 Unspecified abdominal pain: Secondary | ICD-10-CM

## 2016-07-22 DIAGNOSIS — E039 Hypothyroidism, unspecified: Secondary | ICD-10-CM | POA: Diagnosis not present

## 2016-07-22 LAB — CBC WITH DIFFERENTIAL/PLATELET
Basophils Absolute: 0 10*3/uL (ref 0–0.1)
Basophils Relative: 0 %
Eosinophils Absolute: 0.1 10*3/uL (ref 0–0.7)
Eosinophils Relative: 1 %
HCT: 39.4 % (ref 35.0–47.0)
Hemoglobin: 14.1 g/dL (ref 12.0–16.0)
Lymphocytes Relative: 38 %
Lymphs Abs: 3.3 10*3/uL (ref 1.0–3.6)
MCH: 32.4 pg (ref 26.0–34.0)
MCHC: 35.7 g/dL (ref 32.0–36.0)
MCV: 90.7 fL (ref 80.0–100.0)
Monocytes Absolute: 0.5 10*3/uL (ref 0.2–0.9)
Monocytes Relative: 6 %
Neutro Abs: 4.7 10*3/uL (ref 1.4–6.5)
Neutrophils Relative %: 55 %
Platelets: 265 10*3/uL (ref 150–440)
RBC: 4.34 MIL/uL (ref 3.80–5.20)
RDW: 12.2 % (ref 11.5–14.5)
WBC: 8.6 10*3/uL (ref 3.6–11.0)

## 2016-07-22 LAB — COMPREHENSIVE METABOLIC PANEL
ALT: 17 U/L (ref 14–54)
AST: 23 U/L (ref 15–41)
Albumin: 4 g/dL (ref 3.5–5.0)
Alkaline Phosphatase: 66 U/L (ref 38–126)
Anion gap: 6 (ref 5–15)
BUN: 14 mg/dL (ref 6–20)
CO2: 26 mmol/L (ref 22–32)
Calcium: 9 mg/dL (ref 8.9–10.3)
Chloride: 107 mmol/L (ref 101–111)
Creatinine, Ser: 0.72 mg/dL (ref 0.44–1.00)
GFR calc Af Amer: >60 mL/min (ref >60)
GFR calc non Af Amer: >60 mL/min (ref >60)
Glucose, Bld: 96 mg/dL (ref 65–99)
Potassium: 4.5 mmol/L (ref 3.5–5.1)
Sodium: 139 mmol/L (ref 135–145)
Total Bilirubin: 0.7 mg/dL (ref 0.3–1.2)
Total Protein: 7 g/dL (ref 6.5–8.1)

## 2016-07-22 LAB — URINALYSIS COMPLETE WITH MICROSCOPIC (ARMC ONLY)
Bilirubin Urine: NEGATIVE
Glucose, UA: NEGATIVE mg/dL
Hgb urine dipstick: NEGATIVE
Ketones, ur: NEGATIVE mg/dL
Leukocytes, UA: NEGATIVE
Nitrite: NEGATIVE
Protein, ur: NEGATIVE mg/dL
Specific Gravity, Urine: 1.017 (ref 1.005–1.030)
pH: 6 (ref 5.0–8.0)

## 2016-07-22 LAB — LIPASE, BLOOD: Lipase: 59 U/L — ABNORMAL HIGH (ref 11–51)

## 2016-07-22 MED ORDER — ONDANSETRON HCL 4 MG PO TABS
4.0000 mg | ORAL_TABLET | Freq: Once | ORAL | Status: AC
  Administered 2016-07-22: 4 mg via ORAL
  Filled 2016-07-22: qty 1

## 2016-07-22 MED ORDER — HYDROCODONE-ACETAMINOPHEN 5-325 MG PO TABS: 1.0000 | ORAL_TABLET | Freq: Four times a day (QID) | ORAL | 0 refills | Status: DC | PRN

## 2016-07-22 MED ORDER — HYDROCODONE-ACETAMINOPHEN 5-325 MG PO TABS
1.0000 | ORAL_TABLET | Freq: Once | ORAL | Status: AC
  Administered 2016-07-22: 1 via ORAL
  Filled 2016-07-22: qty 1

## 2016-07-22 MED ORDER — ONDANSETRON 4 MG PO TBDP: 4.0000 mg | ORAL_TABLET | Freq: Four times a day (QID) | ORAL | 0 refills | Status: DC | PRN

## 2016-07-22 NOTE — ED Notes (Signed)
This RN attempted IV access x1. Vicente Males, RN attempted x2. Unsuccessful. MD notified and aware.

## 2016-07-22 NOTE — Discharge Instructions (Signed)

## 2016-07-22 NOTE — ED Notes (Signed)
Lab at bedside

## 2016-07-22 NOTE — ED Notes (Signed)
Lab successful in obtaining blood draw.

## 2016-07-22 NOTE — ED Notes (Signed)
Lab called and notified of need of CBC. States they will come draw it.

## 2016-07-22 NOTE — ED Notes (Signed)
Patient transported to [facility] 

## 2016-07-22 NOTE — ED Provider Notes (Signed)
[facility] Emergency Department Provider Note   ____________________________________________   First MD Initiated Contact with Patient 07/22/16 [phone]     (approximate)  I have reviewed the triage vital signs and the nursing notes.   HISTORY  Chief Complaint Abdominal Pain    HPI Renee Benjamin is a 35 y.o. female having intermittent right sided and right upper abdominal pain for the last 3-4 days.  Moderate in intensity, off and on for the last few days. Got worse at about mid evening last night, the persistent pain described as moderate in the right upper abdomen. Associated with nausea and a couple episodes of vomiting. No diarrhea. No fevers or chills. Reports it feels like "gallbladder problems" which she has had previously.  Denies pregnancy, reports a previous hysterectomy and appendectomy.   Past Medical History:  Diagnosis Date  . ADHD (attention deficit hyperactivity disorder) 01/09/2016  . Allergy   . Anemia   . Anxiety   . Anxiety    separation anxiety  . Asthma   . Chronic right hip pain 04/10/2016  . Endometriosis   . Fibromyalgia 01/09/2016  . Headache    otc med prn  . Hypothyroidism   . Plantar fasciitis of left foot 08.29.14   PLANTAR HEEL ,FLUID AROUND MEDIAL BAND  . Polycystic ovarian disease   . Stress fracture 08.15.14   RIGHT   . Thoracic back pain   . Thyroid disease   . Vitamin D deficiency disease     Patient Active Problem List   Diagnosis Date Noted  . Chronic right hip pain 04/10/2016  . Controlled substance agreement signed 02/01/2016  . Obesity 02/01/2016  . ADHD (attention deficit hyperactivity disorder) 01/09/2016  . Fibromyalgia 01/09/2016  . Torticollis, acute 10/08/2015  . Headache 10/02/2015  . Thoracic back pain 10/02/2015  . Lumbar pain 10/02/2015  . Arthralgia 10/02/2015  . Chronic tonsillitis 08/20/2015  . Migraine headache with aura 04/10/2015  . Hypothyroidism 04/10/2015  . PTSD  (post-traumatic stress disorder) 04/10/2015  . Status post laparoscopic hysterectomy 01/03/2015  . Chronic female pelvic pain 11/23/2014  . Plantar fasciitis of left foot   . Stress fracture     Past Surgical History:  Procedure Laterality Date  . ABDOMINAL HYSTERECTOMY  March 2016   Due to uterus being stiched into C Section incision  . APPENDECTOMY    . APPENDECTOMY  2008  . CESAREAN SECTION    . CESAREAN SECTION  2014  . LAPAROSCOPIC BILATERAL SALPINGECTOMY Bilateral 01/02/2015   Procedure: LAPAROSCOPIC BILATERAL SALPINGECTOMY;  Surgeon: Osborne Oman, MD;  Location: [facility];  Service: Gynecology;  Laterality: Bilateral;  . LAPAROSCOPIC HYSTERECTOMY N/A 01/02/2015   Procedure: HYSTERECTOMY TOTAL LAPAROSCOPIC;  Surgeon: Osborne Oman, MD;  Location: [facility];  Service: Gynecology;  Laterality: N/A;  . SHOULDER SURGERY Right   . SHOULDER SURGERY  2001   right    Prior to Admission medications   Medication Sig Start Date End Date Taking? Authorizing Provider  albuterol (PROVENTIL HFA;VENTOLIN HFA) 108 (90 BASE) MCG/ACT inhaler Inhale 2 puffs into the lungs every 6 (six) hours as needed for wheezing or shortness of breath.    Historical Provider, MD  amphetamine-dextroamphetamine (ADDERALL) 10 MG tablet Take 1 tablet (10 mg total) by mouth 2 (two) times daily. 2nd pill 4-6 hours after 1st pill 01/28/16   Arnetha Courser, MD  clonazePAM (KLONOPIN) 0.5 MG tablet Take 0.5 mg by mouth 2 (two) times daily as needed for anxiety.  Historical Provider, MD  Cyanocobalamin (RA VITAMIN B-12 TR) 1000 MCG TBCR Take 1,000 mcg by mouth daily. 12/06/15   Historical Provider, MD  DULoxetine (CYMBALTA) 30 MG capsule Take 3 capsules (90 mg total) by mouth daily. 02/04/16   Arnetha Courser, MD  fexofenadine (ALLEGRA) 180 MG tablet Take 180 mg by mouth daily.    Historical Provider, MD  HYDROcodone-acetaminophen (NORCO/VICODIN) 5-325 MG tablet Take 1 tablet by mouth every 6 (six) hours as needed for moderate  pain. 07/22/16   Delman Kitten, MD  ibuprofen (ADVIL,MOTRIN) 800 MG tablet Take 1 tablet (800 mg total) by mouth every 8 (eight) hours as needed. 04/19/16   Arnetha Courser, MD  levothyroxine (SYNTHROID, LEVOTHROID) 75 MCG tablet Take 1 tablet (75 mcg total) by mouth daily before breakfast. 07/17/16   Arnetha Courser, MD  mometasone (NASONEX) 50 MCG/ACT nasal spray Place 2 sprays into the nose daily.    Arnetha Courser, MD  montelukast (SINGULAIR) 10 MG tablet take 1 tablet by mouth once daily 03/18/16   Arnetha Courser, MD  ondansetron (ZOFRAN ODT) 4 MG disintegrating tablet Take 1 tablet (4 mg total) by mouth every 6 (six) hours as needed for nausea or vomiting. 07/22/16   Delman Kitten, MD  SUMAtriptan (IMITREX) 100 MG tablet Take 100 mg by mouth every 2 (two) hours as needed for migraine. May repeat in 2 hours if headache persists or recurs.    Historical Provider, MD  topiramate (TOPAMAX) 100 MG tablet Take 0.5 tablets (50 mg total) by mouth daily. Never stop abruptly 12/05/15   Arnetha Courser, MD  traZODone (DESYREL) 50 MG tablet take 1 tablet by mouth at bedtime if needed for sleep 02/13/16   Arnetha Courser, MD  Vitamin D, Ergocalciferol, (DRISDOL) 50000 units CAPS capsule Take 50,000 Units by mouth once a week. 12/06/15   Historical Provider, MD    Allergies Depakote [divalproex sodium]; Morphine; Propoxyphene; Tramadol; Tramadol; Darvocet [propoxyphene n-acetaminophen]; and Valproic acid  Family History  Problem Relation Age of Onset  . Diabetes Father   . Osteoporosis Mother   . Cancer Maternal Grandmother     breast  . Osteoporosis Maternal Grandmother     Social History Social History  Substance Use Topics  . Smoking status: Former Smoker    Packs/day: 0.25    Years: 4.00    Types: Cigarettes    Quit date: 04/17/2012  . Smokeless tobacco: Never Used  . Alcohol use No    Review of Systems Constitutional: No fever/chills Eyes: No visual changes. ENT: No sore throat. Cardiovascular:  Denies chest pain. Respiratory: Denies shortness of breath. Gastrointestinal: See history of present illness No diarrhea.  No constipation. Genitourinary: Negative for dysuria. Musculoskeletal: Negative for back pain. Skin: Negative for rash. Neurological: Negative for headaches, focal weakness or numbness.  10-point ROS otherwise negative.  ____________________________________________   PHYSICAL EXAM:  VITAL SIGNS: ED Triage Vitals  Enc Vitals Group     BP 07/22/16 0849 (!) 109/49     Pulse Rate 07/22/16 0849 62     Resp 07/22/16 0849 18     Temp 07/22/16 0849 98 F (36.7 C)     Temp Source 07/22/16 0849 Oral     SpO2 07/22/16 0849 100 %     Weight 07/22/16 0850 212 lb (96.2 kg)     Height 07/22/16 0850 5\' 8"  (1.727 m)     Head Circumference --      Peak Flow --  Pain Score 07/22/16 0845 8     Pain Loc --      Pain Edu? --      Excl. in [place]? --     Constitutional: Alert and oriented. Well appearing and in no acute distress. Eyes: Conjunctivae are normal. PERRL. EOMI. Head: Atraumatic. Nose: No congestion/rhinnorhea. Mouth/Throat: Mucous membranes are moist.  Oropharynx non-erythematous. Neck: No stridor.   Cardiovascular: Normal rate, regular rhythm. Grossly normal heart sounds.  Good peripheral circulation. Respiratory: Normal respiratory effort.  No retractions. Lungs CTAB. Gastrointestinal: Soft and nontenderExcept for moderate tenderness in the right upper abdomen without rebound or guarding. No left-sided abdominal pain. No pain at McBurney's point.. No distention. No abdominal bruits. No CVA tenderness. GYN exam not performed. The patient states previous hysterectomy, no vaginal discharge or discomfort. No lower abdominal pain. Musculoskeletal: No lower extremity tenderness nor edema.  No joint effusions. Neurologic:  Normal speech and language. No gross focal neurologic deficits are appreciated. No gait instability. Skin:  Skin is warm, dry and intact. No  rash noted. Psychiatric: Mood and affect are normal. Speech and behavior are normal.  ____________________________________________   LABS (all labs ordered are listed, but only abnormal results are displayed)  Labs Reviewed  URINALYSIS COMPLETEWITH MICROSCOPIC ([place]) - Abnormal; Notable for the following:       Result Value   Color, Urine YELLOW (*)    APPearance CLOUDY (*)    Bacteria, UA RARE (*)    Squamous Epithelial / LPF TOO NUMEROUS TO COUNT (*)    All other components within normal limits  CBC WITH DIFFERENTIAL/PLATELET  COMPREHENSIVE METABOLIC PANEL  LIPASE, BLOOD  CBC WITH DIFFERENTIAL/PLATELET   ____________________________________________  EKG   ____________________________________________  RADIOLOGY  US Abdomen Limited Ruq  Result Date: 07/22/2016 CLINICAL DATA:  Right upper quadrant pain for 2 weeks. nausea and vomiting for 1 day. EXAM: US ABDOMEN LIMITED - RIGHT UPPER QUADRANT COMPARISON:  05/12/2013 FINDINGS: Gallbladder: No gallstones or wall thickening visualized. No sonographic Murphy sign noted by sonographer. Common bile duct: Diameter: 6 mm, within normal limits. Liver: No focal lesion identified. Within normal limits in parenchymal echogenicity. Portal vein is patent. IMPRESSION: Negative. No gallstones or other hepatobiliary abnormality identified. Electronically Signed   By: Earle Gell M.D.   On: 07/22/2016 11:15    ____________________________________________   PROCEDURES  Procedure(s) performed: None  Procedures  Critical Care performed: No  ____________________________________________   INITIAL IMPRESSION / ASSESSMENT AND PLAN / ED COURSE  Pertinent labs & imaging results that were available during my care of the patient were reviewed by me and considered in my medical decision making (see chart for details).  Differential diagnosis includes but is not limited to, abdominal perforation, aortic dissection, cholecystitis,  appendicitis, diverticulitis, colitis, esophagitis/gastritis, kidney stone, pyelonephritis, urinary tract infection, aortic aneurysm. All are considered in decision and treatment plan. Based upon the patient's presentation and risk factors, patient is reporting right upper quadrant abdominal pain with a fever or leukocytosis. Proceed with right upper quadrant ultrasound to evaluate for possible biliary disease. Patient denies any right lower abdominal pain or gynecologic symptoms. Is moving her bowels normally, and examination does not support that of obstructive process. No significant risk factors for dissection or aneurysm.  ----------------------------------------- [id number] PM on 07/22/2016 ----------------------------------------- I discussed with the patient the risks and benefits of abdominal CT scan. The present time there is no clear indication that the patient requires CT, the patient does have an abdominal complaint but exam does not suggest acute  surgical abdomen and my suspicion for intra-abdominal infection including appendicitis, cholecystitis, aaa, dissection, ischemia, perforation, pancreatitis, diverticulitis or other acute major intra-abdominal process is quite low. After discussing the risks and benefits including benefits of additional evaluation for diagnoses, ruling out infection/perforation/aaa/etc, but also discussing the risks including low, "well less than 1%," but not 0 risk of inducing cancers due to radiation and potential risks of contrast the patient indicated via our shared medical decision-making that she would not do a CAT scan. Rather if the patient does have worsening symptoms, develops a high fever, develops pain or persistent discomfort in the right upper quadrant or right lower quadrant, or other new concerns arise they will come back to emergency room right away. As the patient's clinician I think this is a very reasonable decision having discussed general risks and benefits  of CT, and my clinical suspicion that CT would be of benefit at this time is low.     Clinical Course   ----------------------------------------- [id number] PM on 07/22/2016 ----------------------------------------- Discussed with the patient, she feels improved and would like to be a little home. Nontoxic, well-appearing and we discussed very careful abdominal pain return precautions. Discussed risks and benefits of CT scan, after shared medical decision making patient will monitor symptoms at home and I provided her brief course of pain medication. Return to the ER if any new concerns such as increasing pain, fever, vomiting, or other concerns arise.    ____________________________________________   FINAL CLINICAL IMPRESSION(S) / ED DIAGNOSES  Final diagnoses:  Abdominal pain  Abdominal pain, right lateral      NEW MEDICATIONS STARTED DURING THIS VISIT:  Discharge Medication List as of 07/22/2016 12:51 PM    START taking these medications   Details  HYDROcodone-acetaminophen (NORCO/VICODIN) 5-325 MG tablet Take 1 tablet by mouth every 6 (six) hours as needed for moderate pain., Starting Tue 07/22/2016, Print    ondansetron (ZOFRAN ODT) 4 MG disintegrating tablet Take 1 tablet (4 mg total) by mouth every 6 (six) hours as needed for nausea or vomiting., Starting Tue 07/22/2016, Print         Note:  This document was prepared using Dragon voice recognition software and may include unintentional dictation errors.     Delman Kitten, MD 07/22/16 [phone]

## 2016-07-22 NOTE — ED Triage Notes (Signed)
Pt to ed with c/o right lower abd pain dull pain x 1 week.  After eating last night states pain became severe.

## 2016-07-24 ENCOUNTER — Encounter: Payer: Self-pay | Admitting: Emergency Medicine

## 2016-07-24 ENCOUNTER — Ambulatory Visit (INDEPENDENT_AMBULATORY_CARE_PROVIDER_SITE_OTHER): Payer: BLUE CROSS/BLUE SHIELD | Admitting: Family Medicine

## 2016-07-24 ENCOUNTER — Emergency Department
Admission: EM | Admit: 2016-07-24 | Discharge: 2016-07-24 | Disposition: A | Discharge status: Home / Self Care | Payer: BLUE CROSS/BLUE SHIELD | Attending: Student in an Organized Health Care Education/Training Program | Admitting: Student in an Organized Health Care Education/Training Program

## 2016-07-24 ENCOUNTER — Emergency Department: Payer: BLUE CROSS/BLUE SHIELD

## 2016-07-24 ENCOUNTER — Encounter: Payer: Self-pay | Admitting: Family Medicine

## 2016-07-24 VITALS — BP 116/78 | HR 93 | Temp 98.3°F | Resp 14 | Wt 211.8 lb

## 2016-07-24 DIAGNOSIS — Z8719 Personal history of other diseases of the digestive system: Secondary | ICD-10-CM | POA: Diagnosis not present

## 2016-07-24 DIAGNOSIS — R1013 Epigastric pain: Secondary | ICD-10-CM

## 2016-07-24 DIAGNOSIS — Z87891 Personal history of nicotine dependence: Secondary | ICD-10-CM | POA: Diagnosis not present

## 2016-07-24 DIAGNOSIS — R1011 Right upper quadrant pain: Secondary | ICD-10-CM | POA: Diagnosis not present

## 2016-07-24 DIAGNOSIS — R748 Abnormal levels of other serum enzymes: Secondary | ICD-10-CM | POA: Diagnosis not present

## 2016-07-24 DIAGNOSIS — E039 Hypothyroidism, unspecified: Secondary | ICD-10-CM | POA: Diagnosis not present

## 2016-07-24 DIAGNOSIS — R110 Nausea: Secondary | ICD-10-CM | POA: Diagnosis not present

## 2016-07-24 DIAGNOSIS — F909 Attention-deficit hyperactivity disorder, unspecified type: Secondary | ICD-10-CM | POA: Diagnosis not present

## 2016-07-24 DIAGNOSIS — R197 Diarrhea, unspecified: Secondary | ICD-10-CM | POA: Diagnosis not present

## 2016-07-24 DIAGNOSIS — J45909 Unspecified asthma, uncomplicated: Secondary | ICD-10-CM | POA: Diagnosis not present

## 2016-07-24 HISTORY — DX: Systemic involvement of connective tissue, unspecified: M35.9

## 2016-07-24 LAB — URINALYSIS COMPLETE WITH MICROSCOPIC (ARMC ONLY)
Bilirubin Urine: NEGATIVE
Glucose, UA: NEGATIVE mg/dL
Hgb urine dipstick: NEGATIVE
Ketones, ur: NEGATIVE mg/dL
Leukocytes, UA: NEGATIVE
Nitrite: NEGATIVE
Protein, ur: NEGATIVE mg/dL
Specific Gravity, Urine: 1.001 — ABNORMAL LOW (ref 1.005–1.030)
pH: 7 (ref 5.0–8.0)

## 2016-07-24 LAB — CBC
HCT: 44.6 % (ref 35.0–47.0)
Hemoglobin: 15.3 g/dL (ref 12.0–16.0)
MCH: 31.8 pg (ref 26.0–34.0)
MCHC: 34.3 g/dL (ref 32.0–36.0)
MCV: 92.8 fL (ref 80.0–100.0)
Platelets: 270 10*3/uL (ref 150–440)
RBC: 4.8 MIL/uL (ref 3.80–5.20)
RDW: 12.3 % (ref 11.5–14.5)
WBC: 9.8 10*3/uL (ref 3.6–11.0)

## 2016-07-24 LAB — COMPREHENSIVE METABOLIC PANEL
ALT: 20 U/L (ref 14–54)
AST: 27 U/L (ref 15–41)
Albumin: 4.4 g/dL (ref 3.5–5.0)
Alkaline Phosphatase: 76 U/L (ref 38–126)
Anion gap: 8 (ref 5–15)
BUN: 12 mg/dL (ref 6–20)
CO2: 28 mmol/L (ref 22–32)
Calcium: 9 mg/dL (ref 8.9–10.3)
Chloride: 102 mmol/L (ref 101–111)
Creatinine, Ser: 0.71 mg/dL (ref 0.44–1.00)
GFR calc Af Amer: >60 mL/min (ref >60)
GFR calc non Af Amer: >60 mL/min (ref >60)
Glucose, Bld: 91 mg/dL (ref 65–99)
Potassium: 4.1 mmol/L (ref 3.5–5.1)
Sodium: 138 mmol/L (ref 135–145)
Total Bilirubin: 0.5 mg/dL (ref 0.3–1.2)
Total Protein: 8 g/dL (ref 6.5–8.1)

## 2016-07-24 LAB — LIPASE, BLOOD: Lipase: 29 U/L (ref 11–51)

## 2016-07-24 MED ORDER — IOPAMIDOL (ISOVUE-300) INJECTION 61%
100.0000 mL | Freq: Once | INTRAVENOUS | Status: AC | PRN
  Administered 2016-07-24: 30 mL via INTRAVENOUS

## 2016-07-24 MED ORDER — PROMETHAZINE HCL 12.5 MG PO TABS: 12.5000 mg | ORAL_TABLET | Freq: Four times a day (QID) | ORAL | 0 refills | Status: DC | PRN

## 2016-07-24 MED ORDER — IOPAMIDOL (ISOVUE-300) INJECTION 61%
30.0000 mL | Freq: Once | INTRAVENOUS | Status: AC | PRN
  Administered 2016-07-24: 100 mL via ORAL

## 2016-07-24 MED ORDER — GI COCKTAIL ~~LOC~~
30.0000 mL | Freq: Once | ORAL | Status: AC
  Administered 2016-07-24: 30 mL via ORAL
  Filled 2016-07-24: qty 30

## 2016-07-24 MED ORDER — DICYCLOMINE HCL 20 MG PO TABS: 20.0000 mg | ORAL_TABLET | Freq: Three times a day (TID) | ORAL | 0 refills | Status: DC | PRN

## 2016-07-24 MED ORDER — RANITIDINE HCL 150 MG PO TABS: 150.0000 mg | ORAL_TABLET | Freq: Two times a day (BID) | ORAL | 1 refills | Status: DC

## 2016-07-24 MED ORDER — FENTANYL CITRATE (PF) 100 MCG/2ML IJ SOLN
100.0000 ug | INTRAMUSCULAR | Status: DC | PRN
  Administered 2016-07-24: 100 ug via INTRAVENOUS
  Filled 2016-07-24: qty 2

## 2016-07-24 MED ORDER — SODIUM CHLORIDE 0.9 % IV BOLUS (SEPSIS)
1000.0000 mL | Freq: Once | INTRAVENOUS | Status: AC
  Administered 2016-07-24: 1000 mL via INTRAVENOUS

## 2016-07-24 MED ORDER — PROMETHAZINE HCL 25 MG/ML IJ SOLN
12.5000 mg | Freq: Once | INTRAMUSCULAR | Status: AC
  Administered 2016-07-24: 12.5 mg via INTRAVENOUS
  Filled 2016-07-24: qty 1

## 2016-07-24 NOTE — ED Notes (Signed)
Patient transported to CT 

## 2016-07-24 NOTE — Progress Notes (Signed)
BP 116/78   Pulse 93   Temp 98.3 F (36.8 C) (Oral)   Resp 14   Wt 211 lb 12.8 oz (96.1 kg)   LMP 10/30/2014   SpO2 96%   BMI 32.20 kg/m    Subjective:    Patient ID: Renee Benjamin, female    DOB: 07/04/1981, 35 y.o.   MRN: [id number]  HPI: Renee Benjamin is a 35 y.o. female  Chief Complaint  Patient presents with  . ER followup    Epigatric pain radiating to the right side with nausea and diarrhea   She is here for ER follow-up;for the last week, she has... I don't know; every time I eat, I get immense pain; when she eats, she vomits; terrible smell with bowel movements She was in so much pain she couldn't stand up; she went to the  ER Labs were okay; hurting right now, epigatrium; constant, won't stop Ate a little bit yesterday, trying to keep something in her stomach Within 15-30 minutes, she has pain No blood in the urine Nothing wrong with urine This hurts worse than having a baby she says Labs reviewed; lipase 59 She has had pancreatitis in 2005; "this hurts worse" She had a gallstone get into her pancreas They wanted to take out her gallbladder then, but she didn't have insurance Pain level is an 8 out of 10 right now; 6 to 7 out of 10 before today's visit, getting worse  Depression screen [facility] 2/9 07/24/2016 04/10/2016 07/13/2015  Decreased Interest 0 0 0  Down, Depressed, Hopeless 0 0 0  PHQ - 2 Score 0 0 0   Relevant past medical, surgical, family and social history reviewed Past Medical History:  Diagnosis Date  . ADHD (attention deficit hyperactivity disorder) 01/09/2016  . Allergy   . Anemia   . Anxiety   . Anxiety    separation anxiety  . Asthma   . Chronic right hip pain 04/10/2016  . Endometriosis   . Fibromyalgia 01/09/2016  . Headache    otc med prn  . Hypothyroidism   . Plantar fasciitis of left foot 08.29.14   PLANTAR HEEL ,FLUID AROUND MEDIAL BAND  . Polycystic ovarian disease   . Stress fracture 08.15.14   RIGHT   . Thoracic back  pain   . Thyroid disease   . Vitamin D deficiency disease    Past Surgical History:  Procedure Laterality Date  . ABDOMINAL HYSTERECTOMY  March 2016   Due to uterus being stiched into C Section incision  . APPENDECTOMY    . APPENDECTOMY  2008  . CESAREAN SECTION    . CESAREAN SECTION  2014  . LAPAROSCOPIC BILATERAL SALPINGECTOMY Bilateral 01/02/2015   Procedure: LAPAROSCOPIC BILATERAL SALPINGECTOMY;  Surgeon: Osborne Oman, MD;  Location: [facility];  Service: Gynecology;  Laterality: Bilateral;  . LAPAROSCOPIC HYSTERECTOMY N/A 01/02/2015   Procedure: HYSTERECTOMY TOTAL LAPAROSCOPIC;  Surgeon: Osborne Oman, MD;  Location: [facility];  Service: Gynecology;  Laterality: N/A;  . SHOULDER SURGERY Right   . SHOULDER SURGERY  2001   right   Family History  Problem Relation Age of Onset  . Diabetes Father   . Osteoporosis Mother   . Cancer Maternal Grandmother     breast  . Osteoporosis Maternal Grandmother    Social History  Substance Use Topics  . Smoking status: Former Smoker    Packs/day: 0.25    Years: 4.00    Types: Cigarettes    Quit date: 04/17/2012  .  Smokeless tobacco: Never Used  . Alcohol use No   Interim medical history since last visit reviewed. Allergies and medications reviewed  Review of Systems Per HPI unless specifically indicated above     Objective:    BP 116/78   Pulse 93   Temp 98.3 F (36.8 C) (Oral)   Resp 14   Wt 211 lb 12.8 oz (96.1 kg)   LMP 10/30/2014   SpO2 96%   BMI 32.20 kg/m   Wt Readings from Last 3 Encounters:  07/24/16 211 lb 12.8 oz (96.1 kg)  07/22/16 212 lb (96.2 kg)  05/06/16 205 lb (93 kg)    Physical Exam  Constitutional: She appears well-developed and well-nourished. She appears distressed (crying, in obvious discomfort).  HENT:  Head: Normocephalic and atraumatic.  Eyes: EOM are normal. No scleral icterus.  Neck: No thyromegaly present.  Cardiovascular: Normal rate, regular rhythm and normal heart sounds.   No murmur  heard. Pulmonary/Chest: Effort normal and breath sounds normal. No respiratory distress. She has no wheezes.  Abdominal: Soft. Bowel sounds are normal. She exhibits distension. There is tenderness in the right upper quadrant and epigastric area. There is guarding.  Musculoskeletal: Normal range of motion. She exhibits no edema.  Neurological: She is alert. She exhibits normal muscle tone.  Skin: Skin is warm and dry. She is not diaphoretic. No pallor.  Psychiatric: She has a normal mood and affect. Her behavior is normal. Judgment and thought content normal.  tearful   Results for orders placed or performed during the hospital encounter of 07/22/16  Comprehensive metabolic panel  Result Value Ref Range   Sodium 139 135 - 145 mmol/L   Potassium 4.5 3.5 - 5.1 mmol/L   Chloride 107 101 - 111 mmol/L   CO2 26 22 - 32 mmol/L   Glucose, Bld 96 65 - 99 mg/dL   BUN 14 6 - 20 mg/dL   Creatinine, Ser 0.72 0.44 - 1.00 mg/dL   Calcium 9.0 8.9 - 10.3 mg/dL   Total Protein 7.0 6.5 - 8.1 g/dL   Albumin 4.0 3.5 - 5.0 g/dL   AST 23 15 - 41 U/L   ALT 17 14 - 54 U/L   Alkaline Phosphatase 66 38 - 126 U/L   Total Bilirubin 0.7 0.3 - 1.2 mg/dL   GFR calc non Af Amer >60 >60 mL/min   GFR calc Af Amer >60 >60 mL/min   Anion gap 6 5 - 15  Lipase, blood  Result Value Ref Range   Lipase 59 (H) 11 - 51 U/L  Urinalysis complete, with microscopic ([facility] only)  Result Value Ref Range   Color, Urine YELLOW (A) YELLOW   APPearance CLOUDY (A) CLEAR   Glucose, UA NEGATIVE NEGATIVE mg/dL   Bilirubin Urine NEGATIVE NEGATIVE   Ketones, ur NEGATIVE NEGATIVE mg/dL   Specific Gravity, Urine 1.017 1.005 - 1.030   Hgb urine dipstick NEGATIVE NEGATIVE   pH 6.0 5.0 - 8.0   Protein, ur NEGATIVE NEGATIVE mg/dL   Nitrite NEGATIVE NEGATIVE   Leukocytes, UA NEGATIVE NEGATIVE   RBC / HPF 6-30 0 - 5 RBC/hpf   WBC, UA 6-30 0 - 5 WBC/hpf   Bacteria, UA RARE (A) NONE SEEN   Squamous Epithelial / LPF TOO NUMEROUS TO COUNT  (A) NONE SEEN   Amorphous Crystal PRESENT   CBC with Differential/Platelet  Result Value Ref Range   WBC 8.6 3.6 - 11.0 K/uL   RBC 4.34 3.80 - 5.20 MIL/uL   Hemoglobin 14.1  12.0 - 16.0 g/dL   HCT 39.4 35.0 - 47.0 %   MCV 90.7 80.0 - 100.0 fL   MCH 32.4 26.0 - 34.0 pg   MCHC 35.7 32.0 - 36.0 g/dL   RDW 12.2 11.5 - 14.5 %   Platelets 265 150 - 440 K/uL   Neutrophils Relative % 55 %   Neutro Abs 4.7 1.4 - 6.5 K/uL   Lymphocytes Relative 38 %   Lymphs Abs 3.3 1.0 - 3.6 K/uL   Monocytes Relative 6 %   Monocytes Absolute 0.5 0.2 - 0.9 K/uL   Eosinophils Relative 1 %   Eosinophils Absolute 0.1 0 - 0.7 K/uL   Basophils Relative 0 %   Basophils Absolute 0.0 0 - 0.1 K/uL      Assessment & Plan:   Problem List Items Addressed This Visit    None    Visit Diagnoses    Epigastric abdominal pain    -  Primary   ddx includes duodenitis, pancreatitis, acalculous cholecystitis; given her level of pain, will send her back to the ER   RUQ pain       ddx includes duodenitis, pancreatitis, acalculous cholecystitis; given her level of pain, will send her back to the ER   Elevated lipase       noted in ER labs from two days ago; referring her back to the ER for re-evaluation, work-up, pain control   Hx of pancreatitis       sending pt to ER      Follow up plan: No Follow-up on file.  An after-visit summary was printed and given to the patient at [facility].  Please see the patient instructions which may contain other information and recommendations beyond what is mentioned above in the assessment and plan. Check out given to secretary in the ER; charge nurse not available

## 2016-07-24 NOTE — ED Provider Notes (Signed)
[facility] Emergency Department Provider Note    First MD Initiated Contact with Patient 07/24/16 1206     (approximate)  I have reviewed the triage vital signs and the nursing notes.   HISTORY  Chief Complaint Abdominal Pain    HPI Renee Benjamin is a 35 y.o. female several days of worsening epigastric and right upper quadrant burning abdominal pain associated with nausea and retching. Symptoms are exacerbated by eating. She presented to the ER several days ago and had right upper quadrant ultrasound performed which showed no evidence of acute process. She went to follow up with her PCP and had noted mildly elevated lipase that she was directed to the ER for further evaluation. States that she does have a history of recurrent pancreatitis. This does feel different from previous episodes that she does note pain radiating to her back.  Denies any fevers. States that she is still moving her bowels and is having loose foul-smelling stools.   Past Medical History:  Diagnosis Date  . ADHD (attention deficit hyperactivity disorder) 01/09/2016  . Allergy   . Anemia   . Anxiety   . Anxiety    separation anxiety  . Asthma   . Chronic right hip pain 04/10/2016  . Endometriosis   . Fibromyalgia 01/09/2016  . Headache    otc med prn  . Hypothyroidism   . Plantar fasciitis of left foot 08.29.14   PLANTAR HEEL ,FLUID AROUND MEDIAL BAND  . Polycystic ovarian disease   . Stress fracture 08.15.14   RIGHT   . Thoracic back pain   . Thyroid disease   . Vitamin D deficiency disease     Patient Active Problem List   Diagnosis Date Noted  . Chronic right hip pain 04/10/2016  . Controlled substance agreement signed 02/01/2016  . Obesity 02/01/2016  . ADHD (attention deficit hyperactivity disorder) 01/09/2016  . Fibromyalgia 01/09/2016  . Torticollis, acute 10/08/2015  . Headache 10/02/2015  . Thoracic back pain 10/02/2015  . Lumbar pain 10/02/2015  .  Arthralgia 10/02/2015  . Chronic tonsillitis 08/20/2015  . Migraine headache with aura 04/10/2015  . Hypothyroidism 04/10/2015  . PTSD (post-traumatic stress disorder) 04/10/2015  . Status post laparoscopic hysterectomy 01/03/2015  . Chronic female pelvic pain 11/23/2014  . Plantar fasciitis of left foot   . Stress fracture     Past Surgical History:  Procedure Laterality Date  . ABDOMINAL HYSTERECTOMY  March 2016   Due to uterus being stiched into C Section incision  . APPENDECTOMY    . APPENDECTOMY  2008  . CESAREAN SECTION    . CESAREAN SECTION  2014  . LAPAROSCOPIC BILATERAL SALPINGECTOMY Bilateral 01/02/2015   Procedure: LAPAROSCOPIC BILATERAL SALPINGECTOMY;  Surgeon: Osborne Oman, MD;  Location: [facility];  Service: Gynecology;  Laterality: Bilateral;  . LAPAROSCOPIC HYSTERECTOMY N/A 01/02/2015   Procedure: HYSTERECTOMY TOTAL LAPAROSCOPIC;  Surgeon: Osborne Oman, MD;  Location: [facility];  Service: Gynecology;  Laterality: N/A;  . SHOULDER SURGERY Right   . SHOULDER SURGERY  2001   right    Prior to Admission medications   Medication Sig Start Date End Date Taking? Authorizing Provider  albuterol (PROVENTIL HFA;VENTOLIN HFA) 108 (90 BASE) MCG/ACT inhaler Inhale 2 puffs into the lungs every 6 (six) hours as needed for wheezing or shortness of breath.    Historical Provider, MD  amphetamine-dextroamphetamine (ADDERALL) 10 MG tablet Take 1 tablet (10 mg total) by mouth 2 (two) times daily. 2nd pill 4-6 hours after  1st pill 01/28/16   Arnetha Courser, MD  clonazePAM (KLONOPIN) 0.5 MG tablet Take 0.5 mg by mouth 2 (two) times daily as needed for anxiety.    Historical Provider, MD  Cyanocobalamin (RA VITAMIN B-12 TR) 1000 MCG TBCR Take 1,000 mcg by mouth daily. 12/06/15   Historical Provider, MD  DULoxetine (CYMBALTA) 30 MG capsule Take 3 capsules (90 mg total) by mouth daily. 02/04/16   Arnetha Courser, MD  fexofenadine (ALLEGRA) 180 MG tablet Take 180 mg by mouth daily.    Historical  Provider, MD  HYDROcodone-acetaminophen (NORCO/VICODIN) 5-325 MG tablet Take 1 tablet by mouth every 6 (six) hours as needed for moderate pain. 07/22/16   Delman Kitten, MD  ibuprofen (ADVIL,MOTRIN) 800 MG tablet Take 1 tablet (800 mg total) by mouth every 8 (eight) hours as needed. 04/19/16   Arnetha Courser, MD  levothyroxine (SYNTHROID, LEVOTHROID) 75 MCG tablet Take 1 tablet (75 mcg total) by mouth daily before breakfast. 07/17/16   Arnetha Courser, MD  mometasone (NASONEX) 50 MCG/ACT nasal spray Place 2 sprays into the nose daily.    Arnetha Courser, MD  montelukast (SINGULAIR) 10 MG tablet take 1 tablet by mouth once daily 03/18/16   Arnetha Courser, MD  ondansetron (ZOFRAN ODT) 4 MG disintegrating tablet Take 1 tablet (4 mg total) by mouth every 6 (six) hours as needed for nausea or vomiting. 07/22/16   Delman Kitten, MD  SUMAtriptan (IMITREX) 100 MG tablet Take 100 mg by mouth every 2 (two) hours as needed for migraine. May repeat in 2 hours if headache persists or recurs.    Historical Provider, MD  topiramate (TOPAMAX) 100 MG tablet Take 0.5 tablets (50 mg total) by mouth daily. Never stop abruptly 12/05/15   Arnetha Courser, MD  traZODone (DESYREL) 50 MG tablet take 1 tablet by mouth at bedtime if needed for sleep 02/13/16   Arnetha Courser, MD  Vitamin D, Ergocalciferol, (DRISDOL) 50000 units CAPS capsule Take 50,000 Units by mouth once a week. 12/06/15   Historical Provider, MD    Allergies Depakote [divalproex sodium]; Morphine; Propoxyphene; Tramadol; Tramadol; Darvocet [propoxyphene n-acetaminophen]; and Valproic acid  Family History  Problem Relation Age of Onset  . Diabetes Father   . Osteoporosis Mother   . Cancer Maternal Grandmother     breast  . Osteoporosis Maternal Grandmother     Social History Social History  Substance Use Topics  . Smoking status: Former Smoker    Packs/day: 0.25    Years: 4.00    Types: Cigarettes    Quit date: 04/17/2012  . Smokeless tobacco: Never Used  .  Alcohol use No    Review of Systems Patient denies headaches, rhinorrhea, blurry vision, numbness, shortness of breath, chest pain, edema, cough, abdominal pain, nausea, vomiting, diarrhea, dysuria, fevers, rashes or hallucinations unless otherwise stated above in HPI. ____________________________________________   PHYSICAL EXAM:  VITAL SIGNS: Vitals:   07/24/16 1022  BP: (!) 123/109  Pulse: 77  Resp: 18  Temp: 98.1 F (36.7 C)    Constitutional: Alert and oriented. Well appearing and in no acute distress. Eyes: Conjunctivae are normal. PERRL. EOMI. Head: Atraumatic. Nose: No congestion/rhinnorhea. Mouth/Throat: Mucous membranes are moist.  Oropharynx non-erythematous. Neck: No stridor. Painless ROM. No cervical spine tenderness to palpation Hematological/Lymphatic/Immunilogical: No cervical lymphadenopathy. Cardiovascular: Normal rate, regular rhythm. Grossly normal heart sounds.  Good peripheral circulation. Respiratory: Normal respiratory effort.  No retractions. Lungs CTAB. Gastrointestinal: Soft with mild ttp in RUQ.  No  rebound or guarding.   No distention. No abdominal bruits. No CVA tenderness. Genitourinary:  Musculoskeletal: No lower extremity tenderness nor edema.  No joint effusions. Neurologic:  Normal speech and language. No gross focal neurologic deficits are appreciated. No gait instability. Skin:  Skin is warm, dry and intact. No rash noted. Psychiatric: Mood and affect are normal. Speech and behavior are normal.  ____________________________________________   LABS (all labs ordered are listed, but only abnormal results are displayed)  Results for orders placed or performed during the hospital encounter of 07/24/16 (from the past 24 hour(s))  Lipase, blood     Status: None   Collection Time: 07/24/16 12:14 PM  Result Value Ref Range   Lipase 29 11 - 51 U/L  Comprehensive metabolic panel     Status: None   Collection Time: 07/24/16 12:14 PM  Result  Value Ref Range   Sodium 138 135 - 145 mmol/L   Potassium 4.1 3.5 - 5.1 mmol/L   Chloride 102 101 - 111 mmol/L   CO2 28 22 - 32 mmol/L   Glucose, Bld 91 65 - 99 mg/dL   BUN 12 6 - 20 mg/dL   Creatinine, Ser 0.71 0.44 - 1.00 mg/dL   Calcium 9.0 8.9 - 10.3 mg/dL   Total Protein 8.0 6.5 - 8.1 g/dL   Albumin 4.4 3.5 - 5.0 g/dL   AST 27 15 - 41 U/L   ALT 20 14 - 54 U/L   Alkaline Phosphatase 76 38 - 126 U/L   Total Bilirubin 0.5 0.3 - 1.2 mg/dL   GFR calc non Af Amer >60 >60 mL/min   GFR calc Af Amer >60 >60 mL/min   Anion gap 8 5 - 15  CBC     Status: None   Collection Time: 07/24/16 12:14 PM  Result Value Ref Range   WBC 9.8 3.6 - 11.0 K/uL   RBC 4.80 3.80 - 5.20 MIL/uL   Hemoglobin 15.3 12.0 - 16.0 g/dL   HCT 44.6 35.0 - 47.0 %   MCV 92.8 80.0 - 100.0 fL   MCH 31.8 26.0 - 34.0 pg   MCHC 34.3 32.0 - 36.0 g/dL   RDW 12.3 11.5 - 14.5 %   Platelets 270 150 - 440 K/uL  Urinalysis complete, with microscopic     Status: Abnormal   Collection Time: 07/24/16 12:14 PM  Result Value Ref Range   Color, Urine STRAW (A) YELLOW   APPearance HAZY (A) CLEAR   Glucose, UA NEGATIVE NEGATIVE mg/dL   Bilirubin Urine NEGATIVE NEGATIVE   Ketones, ur NEGATIVE NEGATIVE mg/dL   Specific Gravity, Urine 1.001 (L) 1.005 - 1.030   Hgb urine dipstick NEGATIVE NEGATIVE   pH 7.0 5.0 - 8.0   Protein, ur NEGATIVE NEGATIVE mg/dL   Nitrite NEGATIVE NEGATIVE   Leukocytes, UA NEGATIVE NEGATIVE   RBC / HPF 0-5 0 - 5 RBC/hpf   WBC, UA 0-5 0 - 5 WBC/hpf   Bacteria, UA RARE (A) NONE SEEN   Squamous Epithelial / LPF 0-5 (A) NONE SEEN   Mucous PRESENT    ____________________________________________ _____________________________  RADIOLOGY  See chart ____________________________________________   PROCEDURES  Procedure(s) performed: none    Critical Care performed: no ____________________________________________   INITIAL IMPRESSION / ASSESSMENT AND PLAN / ED COURSE  Pertinent labs & imaging  results that were available during my care of the patient were reviewed by me and considered in my medical decision making (see chart for details).  DDX: cholelithiasis, cholecystitis, pancreatitis, gastritis, pud,  ibd  CHARLEI MESSIMER is a 35 y.o. who presents to the ED with Epigastric pain after eating. Since here from primary care physician's office for CT scan. Patient's lab work is unremarkable and her abdominal exam is soft benign with based on symptoms and patient being sent from her primary care physician's office will order CT scan.   Clinical Course  Comment By Time  Patient reassessed. Abdominal exam benign. Merlyn Lot, MD 09/21 1411  CT imaging unremarkable. Laboratory work unremarkable. Patient remains hemodynamic stable and afebrile. Repeat abdominal exam is soft and benign. We'll discharge for outpatient follow-up. Merlyn Lot, MD 09/21 1554     ____________________________________________   FINAL CLINICAL IMPRESSION(S) / ED DIAGNOSES  Final diagnoses:  Epigastric pain  Nausea  Diarrhea, unspecified type      NEW MEDICATIONS STARTED DURING THIS VISIT:  New Prescriptions   No medications on file     Note:  This document was prepared using Dragon voice recognition software and may include unintentional dictation errors.    Merlyn Lot, MD 07/24/16 2236

## 2016-07-24 NOTE — Patient Instructions (Signed)
Go now to the emergency department

## 2016-07-24 NOTE — ED Notes (Signed)
Abdominal pain and nausea X 3 days. Seen here X 2 days ago. Sent back to ER today for high lipase.

## 2016-07-24 NOTE — Discharge Instructions (Signed)

## 2016-07-24 NOTE — ED Notes (Signed)
Pt alert and oriented X4, active, cooperative, pt in NAD. RR even and unlabored, color WNL.  Pt informed to return if any life threatening symptoms occur.   

## 2016-07-24 NOTE — ED Notes (Signed)
MD at bedside. 

## 2016-07-24 NOTE — ED Triage Notes (Signed)
Pt states her Dr sent her here for CT abd for lipase level that was elevated 2 days ago, was seen here. Pt states pain in upper mid abd and URQ. Pain worsens with eating/drinking.

## 2016-07-24 NOTE — ED Notes (Signed)
Attempt fir IV access X 1 unsuccessful.

## 2016-07-24 NOTE — ED Notes (Signed)
Pt reports that she was stuck X 5 times at last ER visit with no success at IV establishment. Pt anxious and concerned about trouble with IV establishment today. Encouraged to voice concerns with MD and will go from there in course of treatment. Pt has drank 1.5 bottles of CT contrast. Blood obtained from attempt at IV establishment X 1.

## 2016-08-06 ENCOUNTER — Ambulatory Visit (INDEPENDENT_AMBULATORY_CARE_PROVIDER_SITE_OTHER): Payer: BLUE CROSS/BLUE SHIELD | Admitting: Family Medicine

## 2016-08-06 ENCOUNTER — Encounter: Payer: Self-pay | Admitting: Family Medicine

## 2016-08-06 VITALS — BP 116/84 | HR 92 | Temp 98.1°F | Resp 14 | Wt 214.0 lb

## 2016-08-06 DIAGNOSIS — E6609 Other obesity due to excess calories: Secondary | ICD-10-CM

## 2016-08-06 DIAGNOSIS — Z6832 Body mass index (BMI) 32.0-32.9, adult: Secondary | ICD-10-CM

## 2016-08-06 DIAGNOSIS — E66811 Other obesity due to excess calories: Secondary | ICD-10-CM

## 2016-08-06 DIAGNOSIS — R42 Dizziness and giddiness: Secondary | ICD-10-CM

## 2016-08-06 MED ORDER — BUDESONIDE 32 MCG/ACT NA SUSP: 2.0000 | Freq: Every day | NASAL | 1 refills | Status: DC

## 2016-08-06 NOTE — Patient Instructions (Addendum)
Find a provider who is skilled in bio-identical hormones Switch nasal spray Avoid any triggers Use half of the meclizine See Ear Nose Throat if not getting better  Check out the information at familydoctor.org entitled "Nutrition for Weight Loss: What You Need to Know about Fad Diets" Try to lose between 1-2 pounds per week by taking in fewer calories and burning off more calories You can succeed by limiting portions, limiting foods dense in calories and fat, becoming more active, and drinking 8 glasses of water a day (64 ounces) Don't skip meals, especially breakfast, as skipping meals may alter your metabolism Do not use over-the-counter weight loss pills or gimmicks that claim rapid weight loss A healthy BMI (or body mass index) is between 18.5 and 24.9 You can calculate your ideal BMI at the [place] website [employer]fr

## 2016-08-06 NOTE — Progress Notes (Signed)
BP 116/84   Pulse 92   Temp 98.1 F (36.7 C) (Oral)   Resp 14   Wt 214 lb (97.1 kg)   LMP 10/30/2014   SpO2 97%   BMI 32.54 kg/m    Subjective:    Patient ID: Renee Benjamin, female    DOB: 09/02/1981, 35 y.o.   MRN: [id number]  HPI: Renee Benjamin is a 35 y.o. female  Chief Complaint  Patient presents with  . Dizziness   They told her she has an inner ear infection; went to the [facility] urgent care; they gave her meclizine and coricidin; her ear has eased up a little with the pressure but now she has Fall coming, everything is in an uproar; the right side is like the medicine won't go up; her right ear she can barely hear out of it; when she went to sit up, feet flat on the horizon, feels spinny, a little off, not normal; they did not put her on steroids; they said the decongestants and meclizine would help; no fevers; just 99.4 highest  She went to the ER and they thought she had an ulcer; on ranitidine twice a day; no abdominal pain now  She is struggling with weight loss after having her hysterectomy; she is eating the same, exercising the same  Relevant past medical, surgical, family and social history reviewed Past Medical History:  Diagnosis Date  . ADHD (attention deficit hyperactivity disorder) 01/09/2016  . Allergy   . Anemia   . Anxiety   . Anxiety    separation anxiety  . Asthma   . Cancer ([place])   . Chronic right hip pain 04/10/2016  . Collagen vascular disease ([place])   . Endometriosis   . Fibromyalgia 01/09/2016  . Headache    otc med prn  . Hypothyroidism   . Plantar fasciitis of left foot 08.29.14   PLANTAR HEEL ,FLUID AROUND MEDIAL BAND  . Polycystic ovarian disease   . Stress fracture 08.15.14   RIGHT   . Thoracic back pain   . Thyroid disease   . Vitamin D deficiency disease    Past Surgical History:  Procedure Laterality Date  . ABDOMINAL HYSTERECTOMY  March 2016   Due to uterus being stiched into C Section incision  .  APPENDECTOMY    . APPENDECTOMY  2008  . CESAREAN SECTION    . CESAREAN SECTION  2014  . LAPAROSCOPIC BILATERAL SALPINGECTOMY Bilateral 01/02/2015   Procedure: LAPAROSCOPIC BILATERAL SALPINGECTOMY;  Surgeon: Osborne Oman, MD;  Location: [facility];  Service: Gynecology;  Laterality: Bilateral;  . LAPAROSCOPIC HYSTERECTOMY N/A 01/02/2015   Procedure: HYSTERECTOMY TOTAL LAPAROSCOPIC;  Surgeon: Osborne Oman, MD;  Location: [facility];  Service: Gynecology;  Laterality: N/A;  . SHOULDER SURGERY Right   . SHOULDER SURGERY  2001   right   Family History  Problem Relation Age of Onset  . Diabetes Father   . Osteoporosis Mother   . Cancer Maternal Grandmother     breast  . Osteoporosis Maternal Grandmother    Social History  Substance Use Topics  . Smoking status: Former Smoker    Packs/day: 0.25    Years: 4.00    Types: Cigarettes    Quit date: 04/17/2012  . Smokeless tobacco: Never Used  . Alcohol use No   Interim medical history since last visit reviewed. Allergies and medications reviewed  Review of Systems Per HPI unless specifically indicated above     Objective:    BP  116/84   Pulse 92   Temp 98.1 F (36.7 C) (Oral)   Resp 14   Wt 214 lb (97.1 kg)   LMP 10/30/2014   SpO2 97%   BMI 32.54 kg/m   Wt Readings from Last 3 Encounters:  08/06/16 214 lb (97.1 kg)  07/24/16 211 lb (95.7 kg)  07/24/16 211 lb 12.8 oz (96.1 kg)    Physical Exam  Constitutional: She appears well-developed and well-nourished. No distress.  HENT:  Head: Normocephalic and atraumatic.  Right Ear: External ear normal.  Left Ear: External ear normal.  Nose: Nose normal.  Mouth/Throat: Oropharynx is clear and moist. No oropharyngeal exudate.  Eyes: Conjunctivae and EOM are normal. Right eye exhibits no discharge. Left eye exhibits no discharge. No scleral icterus.  Neck: No thyromegaly present.  Cardiovascular: Normal rate, regular rhythm and normal heart sounds.   No murmur  heard. Pulmonary/Chest: Effort normal and breath sounds normal. No respiratory distress. She has no wheezes.  Musculoskeletal: Normal range of motion. She exhibits no edema.  Neurological: She is alert. No cranial nerve deficit. She exhibits normal muscle tone.  Skin: Skin is warm and dry. She is not diaphoretic. No pallor.  Psychiatric: She has a normal mood and affect. Her behavior is normal. Judgment and thought content normal.    Results for orders placed or performed during the hospital encounter of 07/24/16  Lipase, blood  Result Value Ref Range   Lipase 29 11 - 51 U/L  Comprehensive metabolic panel  Result Value Ref Range   Sodium 138 135 - 145 mmol/L   Potassium 4.1 3.5 - 5.1 mmol/L   Chloride 102 101 - 111 mmol/L   CO2 28 22 - 32 mmol/L   Glucose, Bld 91 65 - 99 mg/dL   BUN 12 6 - 20 mg/dL   Creatinine, Ser 0.71 0.44 - 1.00 mg/dL   Calcium 9.0 8.9 - 10.3 mg/dL   Total Protein 8.0 6.5 - 8.1 g/dL   Albumin 4.4 3.5 - 5.0 g/dL   AST 27 15 - 41 U/L   ALT 20 14 - 54 U/L   Alkaline Phosphatase 76 38 - 126 U/L   Total Bilirubin 0.5 0.3 - 1.2 mg/dL   GFR calc non Af Amer >60 >60 mL/min   GFR calc Af Amer >60 >60 mL/min   Anion gap 8 5 - 15  CBC  Result Value Ref Range   WBC 9.8 3.6 - 11.0 K/uL   RBC 4.80 3.80 - 5.20 MIL/uL   Hemoglobin 15.3 12.0 - 16.0 g/dL   HCT 44.6 35.0 - 47.0 %   MCV 92.8 80.0 - 100.0 fL   MCH 31.8 26.0 - 34.0 pg   MCHC 34.3 32.0 - 36.0 g/dL   RDW 12.3 11.5 - 14.5 %   Platelets 270 150 - 440 K/uL  Urinalysis complete, with microscopic  Result Value Ref Range   Color, Urine STRAW (A) YELLOW   APPearance HAZY (A) CLEAR   Glucose, UA NEGATIVE NEGATIVE mg/dL   Bilirubin Urine NEGATIVE NEGATIVE   Ketones, ur NEGATIVE NEGATIVE mg/dL   Specific Gravity, Urine 1.001 (L) 1.005 - 1.030   Hgb urine dipstick NEGATIVE NEGATIVE   pH 7.0 5.0 - 8.0   Protein, ur NEGATIVE NEGATIVE mg/dL   Nitrite NEGATIVE NEGATIVE   Leukocytes, UA NEGATIVE NEGATIVE   RBC /  HPF 0-5 0 - 5 RBC/hpf   WBC, UA 0-5 0 - 5 WBC/hpf   Bacteria, UA RARE (A) NONE SEEN  Squamous Epithelial / LPF 0-5 (A) NONE SEEN   Mucous PRESENT       Assessment & Plan:   Problem List Items Addressed This Visit      Other   Obesity (Chronic)    Weight has increased since last visit; encouraged patient to work on weight loss; see after visit summary       Other Visit Diagnoses    Vertigo    -  Primary   continue meclizine, half dose if needed; nasal corticosteroids; to ENT if not resolving      Follow up plan: No Follow-up on file.  An after-visit summary was printed and given to the patient at [facility].  Please see the patient instructions which may contain other information and recommendations beyond what is mentioned above in the assessment and plan.  Meds ordered this encounter  Medications  . meclizine (ANTIVERT) 25 MG tablet    Sig: Take 1 tablet (25 mg total) by mouth 3 (three) times daily as needed for dizziness.    Refill:  0  . budesonide (RHINOCORT AQUA) 32 MCG/ACT nasal spray    Sig: Place 2 sprays into both nostrils daily.    Dispense:  1 Bottle    Refill:  1

## 2016-08-11 NOTE — Assessment & Plan Note (Addendum)
Weight has increased since last visit; encouraged patient to work on weight loss; see after visit summary

## 2016-09-23 ENCOUNTER — Encounter: Payer: Self-pay | Admitting: Family Medicine

## 2016-09-23 DIAGNOSIS — F4310 Post-traumatic stress disorder, unspecified: Secondary | ICD-10-CM

## 2016-09-24 NOTE — Assessment & Plan Note (Signed)
Refer to psych 

## 2016-10-08 ENCOUNTER — Telehealth: Payer: Self-pay | Admitting: Family Medicine

## 2016-10-08 NOTE — Telephone Encounter (Signed)
I called patient but mail box was full

## 2016-10-08 NOTE — Telephone Encounter (Signed)
I received an unread MyChart notice Please call patient and ask her to check her MyChart messages You can give her the info in the note if desired

## 2016-11-13 ENCOUNTER — Emergency Department
Admission: EM | Admit: 2016-11-13 | Discharge: 2016-11-13 | Disposition: A | Discharge status: Home / Self Care | Payer: BLUE CROSS/BLUE SHIELD | Attending: Emergency Medicine | Admitting: Emergency Medicine

## 2016-11-13 ENCOUNTER — Encounter: Payer: Self-pay | Admitting: Emergency Medicine

## 2016-11-13 DIAGNOSIS — R52 Pain, unspecified: Secondary | ICD-10-CM | POA: Insufficient documentation

## 2016-11-13 DIAGNOSIS — R110 Nausea: Secondary | ICD-10-CM | POA: Insufficient documentation

## 2016-11-13 DIAGNOSIS — J111 Influenza due to unidentified influenza virus with other respiratory manifestations: Secondary | ICD-10-CM

## 2016-11-13 DIAGNOSIS — E039 Hypothyroidism, unspecified: Secondary | ICD-10-CM | POA: Insufficient documentation

## 2016-11-13 DIAGNOSIS — R69 Illness, unspecified: Secondary | ICD-10-CM

## 2016-11-13 DIAGNOSIS — R509 Fever, unspecified: Secondary | ICD-10-CM | POA: Insufficient documentation

## 2016-11-13 DIAGNOSIS — R0981 Nasal congestion: Secondary | ICD-10-CM | POA: Insufficient documentation

## 2016-11-13 DIAGNOSIS — J45909 Unspecified asthma, uncomplicated: Secondary | ICD-10-CM | POA: Insufficient documentation

## 2016-11-13 DIAGNOSIS — R05 Cough: Secondary | ICD-10-CM | POA: Insufficient documentation

## 2016-11-13 DIAGNOSIS — Z87891 Personal history of nicotine dependence: Secondary | ICD-10-CM | POA: Insufficient documentation

## 2016-11-13 LAB — INFLUENZA PANEL BY PCR (TYPE A & B)
Influenza A By PCR: NEGATIVE
Influenza B By PCR: NEGATIVE

## 2016-11-13 MED ORDER — BENZONATATE 100 MG PO CAPS: 100.0000 mg | ORAL_CAPSULE | Freq: Three times a day (TID) | ORAL | 0 refills | Status: AC | PRN

## 2016-11-13 NOTE — ED Provider Notes (Signed)
[facility] Emergency Department Provider Note  ____________________________________________  Time seen: Approximately 4:49 PM  I have reviewed the triage vital signs and the nursing notes.   HISTORY  Chief Complaint Generalized Body Aches    HPI Renee Benjamin is a 36 y.o. female presenting to the emergency department with headache, rhinorrhea, congestion, malaise and myalgias for one day. Additional symptoms include diarrhea, mild nausea and non-productive cough. Non-productive cough also started yesterday. Patient states that her highest temperature has been evaluated 66F, assessed orally.Patient works at a daycare. She has had numerous sick contacts. She denies recent travel, shortness of breath, chest pain and constipation. Patient has tried sleep to relieve her symptoms. No other alleviating measures have been attempted.    Past Medical History:  Diagnosis Date  . ADHD (attention deficit hyperactivity disorder) 01/09/2016  . Allergy   . Anemia   . Anxiety   . Anxiety    separation anxiety  . Asthma   . Cancer ([place])   . Chronic right hip pain 04/10/2016  . Collagen vascular disease ([place])   . Endometriosis   . Fibromyalgia 01/09/2016  . Headache    otc med prn  . Hypothyroidism   . Plantar fasciitis of left foot 08.29.14   PLANTAR HEEL ,FLUID AROUND MEDIAL BAND  . Polycystic ovarian disease   . Stress fracture 08.15.14   RIGHT   . Thoracic back pain   . Thyroid disease   . Vitamin D deficiency disease     Patient Active Problem List   Diagnosis Date Noted  . Chronic right hip pain 04/10/2016  . Controlled substance agreement signed 02/01/2016  . Obesity 02/01/2016  . ADHD (attention deficit hyperactivity disorder) 01/09/2016  . Fibromyalgia 01/09/2016  . Torticollis, acute 10/08/2015  . Headache 10/02/2015  . Thoracic back pain 10/02/2015  . Lumbar pain 10/02/2015  . Arthralgia 10/02/2015  . Chronic tonsillitis 08/20/2015  .  Migraine headache with aura 04/10/2015  . Hypothyroidism 04/10/2015  . PTSD (post-traumatic stress disorder) 04/10/2015  . Status post laparoscopic hysterectomy 01/03/2015  . Chronic female pelvic pain 11/23/2014  . Plantar fasciitis of left foot   . Stress fracture     Past Surgical History:  Procedure Laterality Date  . ABDOMINAL HYSTERECTOMY  March 2016   Due to uterus being stiched into C Section incision  . APPENDECTOMY    . APPENDECTOMY  2008  . CESAREAN SECTION    . CESAREAN SECTION  2014  . LAPAROSCOPIC BILATERAL SALPINGECTOMY Bilateral 01/02/2015   Procedure: LAPAROSCOPIC BILATERAL SALPINGECTOMY;  Surgeon: Osborne Oman, MD;  Location: [facility];  Service: Gynecology;  Laterality: Bilateral;  . LAPAROSCOPIC HYSTERECTOMY N/A 01/02/2015   Procedure: HYSTERECTOMY TOTAL LAPAROSCOPIC;  Surgeon: Osborne Oman, MD;  Location: [facility];  Service: Gynecology;  Laterality: N/A;  . SHOULDER SURGERY Right   . SHOULDER SURGERY  2001   right    Prior to Admission medications   Medication Sig Start Date End Date Taking? Authorizing Provider  albuterol (PROVENTIL HFA;VENTOLIN HFA) 108 (90 BASE) MCG/ACT inhaler Inhale 2 puffs into the lungs every 6 (six) hours as needed for wheezing or shortness of breath.    Historical Provider, MD  benzonatate (TESSALON PERLES) 100 MG capsule Take 1 capsule (100 mg total) by mouth 3 (three) times daily as needed for cough. 11/13/16 11/20/16  Conley Rolls Stacie Knutzen, PA-C  budesonide (RHINOCORT AQUA) 32 MCG/ACT nasal spray Place 2 sprays into both nostrils daily. 08/06/16   Arnetha Courser, MD  Cyanocobalamin (RA VITAMIN B-12 TR) 1000 MCG TBCR Take 1,000 mcg by mouth daily. 12/06/15   Historical Provider, MD  dicyclomine (BENTYL) 20 MG tablet Take 1 tablet (20 mg total) by mouth 3 (three) times daily as needed for spasms. 07/24/16 07/24/17  Merlyn Lot, MD  DULoxetine (CYMBALTA) 30 MG capsule Take 3 capsules (90 mg total) by mouth daily. 02/04/16   Arnetha Courser, MD   fexofenadine (ALLEGRA) 180 MG tablet Take 180 mg by mouth daily.    Historical Provider, MD  levothyroxine (SYNTHROID, LEVOTHROID) 75 MCG tablet Take 1 tablet (75 mcg total) by mouth daily before breakfast. 07/17/16   Arnetha Courser, MD  meclizine (ANTIVERT) 25 MG tablet Take 1 tablet (25 mg total) by mouth 3 (three) times daily as needed for dizziness. 08/06/16   Arnetha Courser, MD  montelukast (SINGULAIR) 10 MG tablet take 1 tablet by mouth once daily 03/18/16   Arnetha Courser, MD  ondansetron (ZOFRAN ODT) 4 MG disintegrating tablet Take 1 tablet (4 mg total) by mouth every 6 (six) hours as needed for nausea or vomiting. 07/22/16   Delman Kitten, MD  promethazine (PHENERGAN) 12.5 MG tablet Take 1 tablet (12.5 mg total) by mouth every 6 (six) hours as needed for nausea or vomiting. 07/24/16   Merlyn Lot, MD  ranitidine (ZANTAC) 150 MG tablet Take 1 tablet (150 mg total) by mouth 2 (two) times daily. 07/24/16 07/24/17  Merlyn Lot, MD  SUMAtriptan (IMITREX) 100 MG tablet Take 100 mg by mouth every 2 (two) hours as needed for migraine. May repeat in 2 hours if headache persists or recurs.    Historical Provider, MD  topiramate (TOPAMAX) 100 MG tablet Take 0.5 tablets (50 mg total) by mouth daily. Never stop abruptly 12/05/15   Arnetha Courser, MD  traZODone (DESYREL) 50 MG tablet take 1 tablet by mouth at bedtime if needed for sleep 02/13/16   Arnetha Courser, MD  Vitamin D, Ergocalciferol, (DRISDOL) 50000 units CAPS capsule Take 50,000 Units by mouth once a week. 12/06/15   Historical Provider, MD    Allergies Depakote [divalproex sodium]; Morphine; Propoxyphene; Tramadol; Tramadol; Darvocet [propoxyphene n-acetaminophen]; and Valproic acid  Family History  Problem Relation Age of Onset  . Diabetes Father   . Osteoporosis Mother   . Cancer Maternal Grandmother     breast  . Osteoporosis Maternal Grandmother     Social History Social History  Substance Use Topics  . Smoking status: Former  Smoker    Packs/day: 0.25    Years: 4.00    Types: Cigarettes    Quit date: 04/17/2012  . Smokeless tobacco: Never Used  . Alcohol use No     Review of Systems  Constitutional: Patient has been afebrile  Eyes: No visual changes. No discharge ENT: Patient has had congestion.  Cardiovascular: no chest pain. Respiratory: Patient has had non-productive cough.  No SOB. Gastrointestinal: Patient has had nausea and diarrhea. Genitourinary: Negative for dysuria. No hematuria Musculoskeletal: Patient has had myalgias. Skin: Negative for rash, abrasions, lacerations, ecchymosis. Neurological: Patient has had headaches, no focal weakness or numbness. 10-point ROS otherwise negative. ____________________________________________   PHYSICAL EXAM:  VITAL SIGNS: ED Triage Vitals  Enc Vitals Group     BP 11/13/16 1613 125/87     Pulse Rate 11/13/16 1613 83     Resp 11/13/16 1613 20     Temp 11/13/16 1613 99 F (37.2 C)     Temp Source 11/13/16 1613 Oral  SpO2 11/13/16 1613 100 %     Weight 11/13/16 1614 215 lb (97.5 kg)     Height 11/13/16 1614 5\' 8"  (1.727 m)     Head Circumference --      Peak Flow --      Pain Score 11/13/16 1638 3     Pain Loc --      Pain Edu? --      Excl. in [place]? --     Constitutional: Alert and oriented. Patient is lying supine in bed in fetal position.  Eyes: Conjunctivae are normal. PERRL. EOMI. Head: Atraumatic. ENT:      Ears: Tympanic membranes are injected bilaterally without evidence of effusion or purulent exudate. Bony landmarks are visualized bilaterally. No pain with palpation at the tragus.      Nose: Nasal turbinates are edematous and erythematous. Copious rhinorrhea visualized.      Mouth/Throat: Mucous membranes are moist. Posterior pharynx is mildly erythematous. No tonsillar hypertrophy or purulent exudate. Uvula is midline. Neck: Full range of motion. No pain is elicited with flexion at the neck. Hematological/Lymphatic/Immunilogical:  No cervical lymphadenopathy. Cardiovascular: Normal rate, regular rhythm. Normal S1 and S2.  Good peripheral circulation. Respiratory: Normal respiratory effort without tachypnea or retractions. Lungs CTAB. Good air entry to the bases with no decreased or absent breath sounds. Gastrointestinal: Bowel sounds 4 quadrants. Soft and nontender to palpation. No guarding or rigidity. No palpable masses. No distention. No CVA tenderness.  Skin:  Skin is warm, dry and intact. No rash noted. Psychiatric: Mood and affect are normal. Speech and behavior are normal. Patient exhibits appropriate insight and judgement. ____________________________________________   LABS (all labs ordered are listed, but only abnormal results are displayed)  Labs Reviewed  INFLUENZA PANEL BY PCR (TYPE A & B, H1N1)   ____________________________________________  EKG   ____________________________________________  RADIOLOGY  No results found.  ____________________________________________    PROCEDURES  Procedure(s) performed:    Procedures    Medications - No data to display   ____________________________________________   INITIAL IMPRESSION / ASSESSMENT AND PLAN / ED COURSE  Pertinent labs & imaging results that were available during my care of the patient were reviewed by me and considered in my medical decision making (see chart for details).  Review of the [place] CSRS was performed in accordance of the [place] prior to dispensing any controlled drugs.  Clinical Course    Assessment and Plan:  Patient presents to the emergency department with headache, congestion, fatigue, myalgias and nonproductive cough for one day. Symptoms are consistent with influenza. Patient declines Tamiflu at this time. Patient education was provided regarding the course of influenza. Rest and hydration were encouraged. Patient was advised to follow-up with her primary care provider in one week. All patient questions were  answered. Vital signs are reassuring at this time. Patient was prescribed Tessalon pearls at discharge.   ____________________________________________  FINAL CLINICAL IMPRESSION(S) / ED DIAGNOSES  Final diagnoses:  Influenza-like illness      NEW MEDICATIONS STARTED DURING THIS VISIT:  Discharge Medication List as of 11/13/2016  4:26 PM    START taking these medications   Details  benzonatate (TESSALON PERLES) 100 MG capsule Take 1 capsule (100 mg total) by mouth 3 (three) times daily as needed for cough., Starting Thu 11/13/2016, Until Thu 11/20/2016, Print            This chart was dictated using voice recognition software/Dragon. Despite best efforts to proofread, errors can occur which can change the meaning. Any  change was purely unintentional.    Lannie Fields, PA-C 11/13/16 1657    Lisa Roca, MD 11/13/16 [id number]

## 2016-11-13 NOTE — ED Notes (Signed)
States she developed increased body aches with fever  Cough this week

## 2016-11-13 NOTE — ED Triage Notes (Signed)
Body aches with fever/chills since yesterday  cough

## 2016-12-30 ENCOUNTER — Emergency Department
Admission: EM | Admit: 2016-12-30 | Discharge: 2016-12-30 | Disposition: A | Discharge status: Home / Self Care | Payer: BLUE CROSS/BLUE SHIELD | Attending: Emergency Medicine | Admitting: Emergency Medicine

## 2016-12-30 DIAGNOSIS — J45909 Unspecified asthma, uncomplicated: Secondary | ICD-10-CM | POA: Insufficient documentation

## 2016-12-30 DIAGNOSIS — F909 Attention-deficit hyperactivity disorder, unspecified type: Secondary | ICD-10-CM | POA: Insufficient documentation

## 2016-12-30 DIAGNOSIS — E039 Hypothyroidism, unspecified: Secondary | ICD-10-CM | POA: Insufficient documentation

## 2016-12-30 DIAGNOSIS — R05 Cough: Secondary | ICD-10-CM

## 2016-12-30 DIAGNOSIS — J0100 Acute maxillary sinusitis, unspecified: Secondary | ICD-10-CM

## 2016-12-30 DIAGNOSIS — R059 Cough, unspecified: Secondary | ICD-10-CM

## 2016-12-30 DIAGNOSIS — Z79899 Other long term (current) drug therapy: Secondary | ICD-10-CM | POA: Insufficient documentation

## 2016-12-30 DIAGNOSIS — Z87891 Personal history of nicotine dependence: Secondary | ICD-10-CM | POA: Insufficient documentation

## 2016-12-30 MED ORDER — AMOXICILLIN 500 MG PO CAPS: 500.0000 mg | ORAL_CAPSULE | Freq: Three times a day (TID) | ORAL | 0 refills | Status: DC

## 2016-12-30 MED ORDER — HYDROCOD POLST-CPM POLST ER 10-8 MG/5ML PO SUER: 5.0000 mL | Freq: Every evening | ORAL | 0 refills | Status: DC | PRN

## 2016-12-30 MED ORDER — FEXOFENADINE-PSEUDOEPHED ER 60-120 MG PO TB12: 1.0000 | ORAL_TABLET | Freq: Two times a day (BID) | ORAL | 0 refills | Status: DC

## 2016-12-30 MED ORDER — BENZONATATE 100 MG PO CAPS: 100.0000 mg | ORAL_CAPSULE | Freq: Three times a day (TID) | ORAL | 0 refills | Status: DC | PRN

## 2016-12-30 NOTE — ED Provider Notes (Signed)
[facility] Emergency Department Provider Note   ____________________________________________   First MD Initiated Contact with Patient 12/30/16 1743     (approximate)  I have reviewed the triage vital signs and the nursing notes.   HISTORY  Chief Complaint URI    HPI Renee Benjamin is a 36 y.o. female patient complaining of 2 weeks of sinus and chest congestion. Patient states there is also a nonproductive cough secondary to postnasal drainage. Patient also complaining of bilateral ear pressure. 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 Patient denies any fever associated this complaint. Patient rates her pain discomfort as a 6/10. Patient is currently using Allegra her complaint.   Past Medical History:  Diagnosis Date  . ADHD (attention deficit hyperactivity disorder) 01/09/2016  . Allergy   . Anemia   . Anxiety   . Anxiety    separation anxiety  . Asthma   . Chronic right hip pain 04/10/2016  . Collagen vascular disease ([place])   . Endometriosis   . Fibromyalgia 01/09/2016  . Headache    otc med prn  . Hypothyroidism   . Plantar fasciitis of left foot 08.29.14   PLANTAR HEEL ,FLUID AROUND MEDIAL BAND  . Polycystic ovarian disease   . Stress fracture 08.15.14   RIGHT   . Thoracic back pain   . Thyroid disease   . Vitamin D deficiency disease     Patient Active Problem List   Diagnosis Date Noted  . Chronic right hip pain 04/10/2016  . Controlled substance agreement signed 02/01/2016  . Obesity 02/01/2016  . ADHD (attention deficit hyperactivity disorder) 01/09/2016  . Fibromyalgia 01/09/2016  . Torticollis, acute 10/08/2015  . Headache 10/02/2015  . Thoracic back pain 10/02/2015  . Lumbar pain 10/02/2015  . Arthralgia 10/02/2015  . Chronic tonsillitis 08/20/2015  . Migraine headache with aura 04/10/2015  . Hypothyroidism 04/10/2015  . PTSD (post-traumatic stress disorder) 04/10/2015  . Status post laparoscopic hysterectomy  01/03/2015  . Chronic female pelvic pain 11/23/2014  . Plantar fasciitis of left foot   . Stress fracture     Past Surgical History:  Procedure Laterality Date  . ABDOMINAL HYSTERECTOMY  March 2016   Due to uterus being stiched into C Section incision  . APPENDECTOMY    . APPENDECTOMY  2008  . CESAREAN SECTION    . CESAREAN SECTION  2014  . LAPAROSCOPIC BILATERAL SALPINGECTOMY Bilateral 01/02/2015   Procedure: LAPAROSCOPIC BILATERAL SALPINGECTOMY;  Surgeon: Osborne Oman, MD;  Location: [facility];  Service: Gynecology;  Laterality: Bilateral;  . LAPAROSCOPIC HYSTERECTOMY N/A 01/02/2015   Procedure: HYSTERECTOMY TOTAL LAPAROSCOPIC;  Surgeon: Osborne Oman, MD;  Location: [facility];  Service: Gynecology;  Laterality: N/A;  . SHOULDER SURGERY Right   . SHOULDER SURGERY  2001   right    Prior to Admission medications   Medication Sig Start Date End Date Taking? Authorizing Provider  albuterol (PROVENTIL HFA;VENTOLIN HFA) 108 (90 BASE) MCG/ACT inhaler Inhale 2 puffs into the lungs every 6 (six) hours as needed for wheezing or shortness of breath.    Historical Provider, MD  amoxicillin (AMOXIL) 500 MG capsule Take 1 capsule (500 mg total) by mouth 3 (three) times daily. 12/30/16   Sable Feil, PA-C  benzonatate (TESSALON PERLES) 100 MG capsule Take 1 capsule (100 mg total) by mouth 3 (three) times daily as needed for cough. 12/30/16 12/30/17  Sable Feil, PA-C  budesonide (RHINOCORT AQUA) 32 MCG/ACT nasal spray Place 2 sprays into both  nostrils daily. 08/06/16   Arnetha Courser, MD  chlorpheniramine-HYDROcodone (TUSSIONEX PENNKINETIC ER) 10-8 MG/5ML SUER Take 5 mLs by mouth at bedtime as needed for cough. 12/30/16   Sable Feil, PA-C  Cyanocobalamin (RA VITAMIN B-12 TR) 1000 MCG TBCR Take 1,000 mcg by mouth daily. 12/06/15   Historical Provider, MD  dicyclomine (BENTYL) 20 MG tablet Take 1 tablet (20 mg total) by mouth 3 (three) times daily as needed for spasms. 07/24/16 07/24/17  Merlyn Lot, MD  DULoxetine (CYMBALTA) 30 MG capsule Take 3 capsules (90 mg total) by mouth daily. 02/04/16   Arnetha Courser, MD  fexofenadine (ALLEGRA) 180 MG tablet Take 180 mg by mouth daily.    Historical Provider, MD  fexofenadine-pseudoephedrine (ALLEGRA-D) 60-120 MG 12 hr tablet Take 1 tablet by mouth 2 (two) times daily. 12/30/16   Sable Feil, PA-C  levothyroxine (SYNTHROID, LEVOTHROID) 75 MCG tablet Take 1 tablet (75 mcg total) by mouth daily before breakfast. 07/17/16   Arnetha Courser, MD  meclizine (ANTIVERT) 25 MG tablet Take 1 tablet (25 mg total) by mouth 3 (three) times daily as needed for dizziness. 08/06/16   Arnetha Courser, MD  montelukast (SINGULAIR) 10 MG tablet take 1 tablet by mouth once daily 03/18/16   Arnetha Courser, MD  ondansetron (ZOFRAN ODT) 4 MG disintegrating tablet Take 1 tablet (4 mg total) by mouth every 6 (six) hours as needed for nausea or vomiting. 07/22/16   Delman Kitten, MD  promethazine (PHENERGAN) 12.5 MG tablet Take 1 tablet (12.5 mg total) by mouth every 6 (six) hours as needed for nausea or vomiting. 07/24/16   Merlyn Lot, MD  ranitidine (ZANTAC) 150 MG tablet Take 1 tablet (150 mg total) by mouth 2 (two) times daily. 07/24/16 07/24/17  Merlyn Lot, MD  SUMAtriptan (IMITREX) 100 MG tablet Take 100 mg by mouth every 2 (two) hours as needed for migraine. May repeat in 2 hours if headache persists or recurs.    Historical Provider, MD  topiramate (TOPAMAX) 100 MG tablet Take 0.5 tablets (50 mg total) by mouth daily. Never stop abruptly 12/05/15   Arnetha Courser, MD  traZODone (DESYREL) 50 MG tablet take 1 tablet by mouth at bedtime if needed for sleep 02/13/16   Arnetha Courser, MD  Vitamin D, Ergocalciferol, (DRISDOL) 50000 units CAPS capsule Take 50,000 Units by mouth once a week. 12/06/15   Historical Provider, MD    Allergies Depakote [divalproex sodium]; Morphine; Propoxyphene; Tramadol; Tramadol; Darvocet [propoxyphene n-acetaminophen]; and Valproic  acid  Family History  Problem Relation Age of Onset  . Diabetes Father   . Osteoporosis Mother   . Cancer Maternal Grandmother     breast  . Osteoporosis Maternal Grandmother     Social History Social History  Substance Use Topics  . Smoking status: Former Smoker    Packs/day: 0.25    Years: 4.00    Types: Cigarettes    Quit date: 04/17/2012  . Smokeless tobacco: Never Used  . Alcohol use No    Review of Systems Constitutional: No fever/chills Eyes: No visual changes. ENT: No sore throat. Nasal congestion and postnasal drainage Cardiovascular: Denies chest pain. Respiratory: Denies shortness of breath. Nonproductive cough  Gastrointestinal: No abdominal pain.  No nausea, no vomiting.  No diarrhea.  No constipation. Genitourinary: Negative for dysuria. Musculoskeletal: Negative for back pain. Skin: Negative for rash. Neurological: Negative for headaches, focal weakness or numbness. Psychiatric:Anxiety Endocrine:Hypothyroidism Hematological/Lymphatic: Allergic/Immunilogical: Medication list   ____________________________________________   PHYSICAL  EXAM:  VITAL SIGNS: ED Triage Vitals  Enc Vitals Group     BP 12/30/16 1717 113/77     Pulse Rate 12/30/16 1717 (!) 101     Resp 12/30/16 1717 18     Temp 12/30/16 1717 98.5 F (36.9 C)     Temp Source 12/30/16 1717 Oral     SpO2 12/30/16 1717 98 %     Weight 12/30/16 1718 215 lb (97.5 kg)     Height 12/30/16 1718 5\' 8"  (1.727 m)     Head Circumference --      Peak Flow --      Pain Score 12/30/16 1718 6     Pain Loc --      Pain Edu? --      Excl. in [place]? --     Constitutional: Alert and oriented. Well appearing and in no acute distress. Eyes: Conjunctivae are normal. PERRL. EOMI. Head: Atraumatic. Nose: Edematous nasal turbinates with thick rhinorrhea. Bilateral maxillary guarding Mouth/Throat: Mucous membranes are moist.  Oropharynx non-erythematous. Postnasal drainage Neck: No stridor.  No cervical  spine tenderness to palpation. Cardiovascular: Normal rate, regular rhythm. Grossly normal heart sounds.  Good peripheral circulation. Respiratory: Normal respiratory effort.  No retractions. Lungs CTAB. Gastrointestinal: Soft and nontender. No distention. No abdominal bruits. No CVA tenderness. Musculoskeletal: No lower extremity tenderness nor edema.  No joint effusions. Neurologic:  Normal speech and language. No gross focal neurologic deficits are appreciated. No gait instability. Skin:  Skin is warm, dry and intact. No rash noted. Psychiatric: Mood and affect are normal. Speech and behavior are normal.  ____________________________________________   LABS (all labs ordered are listed, but only abnormal results are displayed)  Labs Reviewed - No data to display ____________________________________________  EKG   ____________________________________________  RADIOLOGY   ____________________________________________   PROCEDURES  Procedure(s) performed: None  Procedures  Critical Care performed: No  ____________________________________________   INITIAL IMPRESSION / ASSESSMENT AND PLAN / ED COURSE  Pertinent labs & imaging results that were available during my care of the patient were reviewed by me and considered in my medical decision making (see chart for details).  Sinusitis and cough secondary to postnasal drainage. Patient given discharge care instructions. Patient given a prescription for amoxicillin, Allegra-D, Tessalon to take during the day, and tenseness to take only at night. Advised follow-up family doctor condition persists.      ____________________________________________   FINAL CLINICAL IMPRESSION(S) / ED DIAGNOSES  Final diagnoses:  Subacute maxillary sinusitis  Cough      NEW MEDICATIONS STARTED DURING THIS VISIT:  New Prescriptions   AMOXICILLIN (AMOXIL) 500 MG CAPSULE    Take 1 capsule (500 mg total) by mouth 3 (three) times daily.    BENZONATATE (TESSALON PERLES) 100 MG CAPSULE    Take 1 capsule (100 mg total) by mouth 3 (three) times daily as needed for cough.   CHLORPHENIRAMINE-HYDROCODONE (TUSSIONEX PENNKINETIC ER) 10-8 MG/5ML SUER    Take 5 mLs by mouth at bedtime as needed for cough.   FEXOFENADINE-PSEUDOEPHEDRINE (ALLEGRA-D) 60-120 MG 12 HR TABLET    Take 1 tablet by mouth 2 (two) times daily.     Note:  This document was prepared using Dragon voice recognition software and may include unintentional dictation errors.    Sable Feil, PA-C 12/30/16 [place], MD 12/30/16 2038

## 2016-12-30 NOTE — ED Triage Notes (Signed)
Pt c/o cough with sinus and chest congestion for the past 2 weeks. States she works at a daycare.

## 2017-01-20 ENCOUNTER — Emergency Department
Admission: EM | Admit: 2017-01-20 | Discharge: 2017-01-20 | Disposition: A | Discharge status: Home / Self Care | Payer: BLUE CROSS/BLUE SHIELD | Attending: Emergency Medicine | Admitting: Emergency Medicine

## 2017-01-20 ENCOUNTER — Encounter: Payer: Self-pay | Admitting: Emergency Medicine

## 2017-01-20 DIAGNOSIS — E039 Hypothyroidism, unspecified: Secondary | ICD-10-CM | POA: Insufficient documentation

## 2017-01-20 DIAGNOSIS — Z87891 Personal history of nicotine dependence: Secondary | ICD-10-CM | POA: Insufficient documentation

## 2017-01-20 DIAGNOSIS — J45909 Unspecified asthma, uncomplicated: Secondary | ICD-10-CM | POA: Insufficient documentation

## 2017-01-20 DIAGNOSIS — F909 Attention-deficit hyperactivity disorder, unspecified type: Secondary | ICD-10-CM | POA: Insufficient documentation

## 2017-01-20 DIAGNOSIS — R42 Dizziness and giddiness: Secondary | ICD-10-CM

## 2017-01-20 LAB — BASIC METABOLIC PANEL
Anion gap: 7 (ref 5–15)
BUN: 15 mg/dL (ref 6–20)
CO2: 26 mmol/L (ref 22–32)
Calcium: 9.6 mg/dL (ref 8.9–10.3)
Chloride: 104 mmol/L (ref 101–111)
Creatinine, Ser: 0.7 mg/dL (ref 0.44–1.00)
GFR calc Af Amer: >60 mL/min (ref >60)
GFR calc non Af Amer: >60 mL/min (ref >60)
Glucose, Bld: 93 mg/dL (ref 65–99)
Potassium: 4.2 mmol/L (ref 3.5–5.1)
Sodium: 137 mmol/L (ref 135–145)

## 2017-01-20 LAB — CBC
HCT: 44 % (ref 35.0–47.0)
Hemoglobin: 15 g/dL (ref 12.0–16.0)
MCH: 31.5 pg (ref 26.0–34.0)
MCHC: 34.2 g/dL (ref 32.0–36.0)
MCV: 92.2 fL (ref 80.0–100.0)
Platelets: 368 10*3/uL (ref 150–440)
RBC: 4.77 MIL/uL (ref 3.80–5.20)
RDW: 12.4 % (ref 11.5–14.5)
WBC: 15 10*3/uL — ABNORMAL HIGH (ref 3.6–11.0)

## 2017-01-20 MED ORDER — DIAZEPAM 5 MG PO TABS: 5.0000 mg | ORAL_TABLET | Freq: Four times a day (QID) | ORAL | 0 refills | Status: DC | PRN

## 2017-01-20 MED ORDER — ONDANSETRON 4 MG PO TBDP
ORAL_TABLET | ORAL | Status: AC
  Administered 2017-01-20: 4 mg via ORAL
  Filled 2017-01-20: qty 1

## 2017-01-20 MED ORDER — DIAZEPAM 5 MG PO TABS
5.0000 mg | ORAL_TABLET | Freq: Once | ORAL | Status: AC
  Administered 2017-01-20: 5 mg via ORAL
  Filled 2017-01-20: qty 1

## 2017-01-20 MED ORDER — SODIUM CHLORIDE 0.9 % IV BOLUS (SEPSIS)
1000.0000 mL | Freq: Once | INTRAVENOUS | Status: AC
  Administered 2017-01-20: 1000 mL via INTRAVENOUS

## 2017-01-20 MED ORDER — ONDANSETRON 4 MG PO TBDP
4.0000 mg | ORAL_TABLET | Freq: Once | ORAL | Status: AC
  Administered 2017-01-20: 4 mg via ORAL

## 2017-01-20 NOTE — ED Triage Notes (Addendum)
Pt in via triage with complaints of vertigo x 3 days; pt states meclizine not working.  Pt tearful in triage, states, "I just want it to stop spinning."  Pt repots N/V with vertigo as well, denies any changes to vision.  Pt with hx of same, states meclizine typically works for her.

## 2017-01-20 NOTE — ED Notes (Signed)
Pt reports she has a hx of vertigo but has been having an episode of vertigo for the last 3 days - reports nausea/vomiting with vertigo - unable to focus

## 2017-01-20 NOTE — ED Provider Notes (Addendum)
[facility] Emergency Department Provider Note  ____________________________________________   First MD Initiated Contact with Patient 01/20/17 1539     (approximate)  I have reviewed the triage vital signs and the nursing notes.   HISTORY  Chief Complaint Dizziness   HPI DETRICE CALES is a 36 y.o. female with a history of vertigo who is presenting to the emergency department today with 3 days of vertigo as well as nausea and vomiting. Says that she also has pressure to the right ear. She says that over the past several months she has intermittent vertigo that usually resolves with meclizine. However, over the past several days she is taking meclizine and vertigo continues to return. She says the meclizine has partially relieved her symptoms that she is still symptomatic in the room seems to be flickering from side to side. No weakness or numbness noted.   Past Medical History:  Diagnosis Date  . ADHD (attention deficit hyperactivity disorder) 01/09/2016  . Allergy   . Anemia   . Anxiety   . Anxiety    separation anxiety  . Asthma   . Chronic right hip pain 04/10/2016  . Collagen vascular disease ([place])   . Endometriosis   . Fibromyalgia 01/09/2016  . Headache    otc med prn  . Hypothyroidism   . Plantar fasciitis of left foot 08.29.14   PLANTAR HEEL ,FLUID AROUND MEDIAL BAND  . Polycystic ovarian disease   . Stress fracture 08.15.14   RIGHT   . Thoracic back pain   . Thyroid disease   . Vitamin D deficiency disease     Patient Active Problem List   Diagnosis Date Noted  . Chronic right hip pain 04/10/2016  . Controlled substance agreement signed 02/01/2016  . Obesity 02/01/2016  . ADHD (attention deficit hyperactivity disorder) 01/09/2016  . Fibromyalgia 01/09/2016  . Torticollis, acute 10/08/2015  . Headache 10/02/2015  . Thoracic back pain 10/02/2015  . Lumbar pain 10/02/2015  . Arthralgia 10/02/2015  . Chronic tonsillitis  08/20/2015  . Migraine headache with aura 04/10/2015  . Hypothyroidism 04/10/2015  . PTSD (post-traumatic stress disorder) 04/10/2015  . Status post laparoscopic hysterectomy 01/03/2015  . Chronic female pelvic pain 11/23/2014  . Plantar fasciitis of left foot   . Stress fracture     Past Surgical History:  Procedure Laterality Date  . ABDOMINAL HYSTERECTOMY  March 2016   Due to uterus being stiched into C Section incision  . APPENDECTOMY    . APPENDECTOMY  2008  . CESAREAN SECTION    . CESAREAN SECTION  2014  . LAPAROSCOPIC BILATERAL SALPINGECTOMY Bilateral 01/02/2015   Procedure: LAPAROSCOPIC BILATERAL SALPINGECTOMY;  Surgeon: Osborne Oman, MD;  Location: [facility];  Service: Gynecology;  Laterality: Bilateral;  . LAPAROSCOPIC HYSTERECTOMY N/A 01/02/2015   Procedure: HYSTERECTOMY TOTAL LAPAROSCOPIC;  Surgeon: Osborne Oman, MD;  Location: [facility];  Service: Gynecology;  Laterality: N/A;  . SHOULDER SURGERY Right   . SHOULDER SURGERY  2001   right    Prior to Admission medications   Medication Sig Start Date End Date Taking? Authorizing Provider  albuterol (PROVENTIL HFA;VENTOLIN HFA) 108 (90 BASE) MCG/ACT inhaler Inhale 2 puffs into the lungs every 6 (six) hours as needed for wheezing or shortness of breath.    Historical Provider, MD  amoxicillin (AMOXIL) 500 MG capsule Take 1 capsule (500 mg total) by mouth 3 (three) times daily. 12/30/16   Sable Feil, PA-C  benzonatate (TESSALON PERLES) 100 MG capsule Take  1 capsule (100 mg total) by mouth 3 (three) times daily as needed for cough. 12/30/16 12/30/17  Sable Feil, PA-C  budesonide (RHINOCORT AQUA) 32 MCG/ACT nasal spray Place 2 sprays into both nostrils daily. 08/06/16   Arnetha Courser, MD  chlorpheniramine-HYDROcodone (TUSSIONEX PENNKINETIC ER) 10-8 MG/5ML SUER Take 5 mLs by mouth at bedtime as needed for cough. 12/30/16   Sable Feil, PA-C  Cyanocobalamin (RA VITAMIN B-12 TR) 1000 MCG TBCR Take 1,000 mcg by mouth daily.  12/06/15   Historical Provider, MD  dicyclomine (BENTYL) 20 MG tablet Take 1 tablet (20 mg total) by mouth 3 (three) times daily as needed for spasms. 07/24/16 07/24/17  Merlyn Lot, MD  DULoxetine (CYMBALTA) 30 MG capsule Take 3 capsules (90 mg total) by mouth daily. 02/04/16   Arnetha Courser, MD  fexofenadine (ALLEGRA) 180 MG tablet Take 180 mg by mouth daily.    Historical Provider, MD  fexofenadine-pseudoephedrine (ALLEGRA-D) 60-120 MG 12 hr tablet Take 1 tablet by mouth 2 (two) times daily. 12/30/16   Sable Feil, PA-C  levothyroxine (SYNTHROID, LEVOTHROID) 75 MCG tablet Take 1 tablet (75 mcg total) by mouth daily before breakfast. 07/17/16   Arnetha Courser, MD  meclizine (ANTIVERT) 25 MG tablet Take 1 tablet (25 mg total) by mouth 3 (three) times daily as needed for dizziness. 08/06/16   Arnetha Courser, MD  montelukast (SINGULAIR) 10 MG tablet take 1 tablet by mouth once daily 03/18/16   Arnetha Courser, MD  ondansetron (ZOFRAN ODT) 4 MG disintegrating tablet Take 1 tablet (4 mg total) by mouth every 6 (six) hours as needed for nausea or vomiting. 07/22/16   Delman Kitten, MD  promethazine (PHENERGAN) 12.5 MG tablet Take 1 tablet (12.5 mg total) by mouth every 6 (six) hours as needed for nausea or vomiting. 07/24/16   Merlyn Lot, MD  ranitidine (ZANTAC) 150 MG tablet Take 1 tablet (150 mg total) by mouth 2 (two) times daily. 07/24/16 07/24/17  Merlyn Lot, MD  SUMAtriptan (IMITREX) 100 MG tablet Take 100 mg by mouth every 2 (two) hours as needed for migraine. May repeat in 2 hours if headache persists or recurs.    Historical Provider, MD  topiramate (TOPAMAX) 100 MG tablet Take 0.5 tablets (50 mg total) by mouth daily. Never stop abruptly 12/05/15   Arnetha Courser, MD  traZODone (DESYREL) 50 MG tablet take 1 tablet by mouth at bedtime if needed for sleep 02/13/16   Arnetha Courser, MD  Vitamin D, Ergocalciferol, (DRISDOL) 50000 units CAPS capsule Take 50,000 Units by mouth once a week. 12/06/15    Historical Provider, MD    Allergies Depakote [divalproex sodium]; Morphine; Propoxyphene; Tramadol; Tramadol; Darvocet [propoxyphene n-acetaminophen]; and Valproic acid  Family History  Problem Relation Age of Onset  . Diabetes Father   . Osteoporosis Mother   . Cancer Maternal Grandmother     breast  . Osteoporosis Maternal Grandmother     Social History Social History  Substance Use Topics  . Smoking status: Former Smoker    Packs/day: 0.25    Years: 4.00    Types: Cigarettes    Quit date: 04/17/2012  . Smokeless tobacco: Never Used  . Alcohol use No    Review of Systems Constitutional: No fever/chills Eyes: No visual changes. ENT: No sore throat. Cardiovascular: Denies chest pain. Respiratory: Denies shortness of breath. Gastrointestinal: No abdominal pain.  No nausea, no vomiting.  No diarrhea.  No constipation. Genitourinary: Negative for dysuria. Musculoskeletal: Negative  for back pain. Skin: Negative for rash. Neurological: Negative for headaches, focal weakness or numbness.  10-point ROS otherwise negative.  ____________________________________________   PHYSICAL EXAM:  VITAL SIGNS: ED Triage Vitals  Enc Vitals Group     BP 01/20/17 1440 133/87     Pulse Rate 01/20/17 1440 90     Resp 01/20/17 1440 (!) 22     Temp 01/20/17 1440 98 F (36.7 C)     Temp Source 01/20/17 1440 Oral     SpO2 01/20/17 1440 97 %     Weight 01/20/17 1440 220 lb (99.8 kg)     Height 01/20/17 1440 5\' 8"  (1.727 m)     Head Circumference --      Peak Flow --      Pain Score 01/20/17 1542 0     Pain Loc --      Pain Edu? --      Excl. in [place]? --     Constitutional: Alert and oriented. Well appearing and in no acute distress. Eyes: Conjunctivae are normal. PERRL. EOMI. Head: Atraumatic.Normal TMs bilaterally. Nose: No congestion/rhinnorhea. Mouth/Throat: Mucous membranes are moist.   Neck: No stridor.   Cardiovascular: Normal rate, regular rhythm. Grossly normal heart  sounds.   Respiratory: Normal respiratory effort.  No retractions. Lungs CTAB. Gastrointestinal: Soft and nontender. No distention.  Musculoskeletal: No lower extremity tenderness nor edema.  No joint effusions. Neurologic:  Normal speech and language. No gross focal neurologic deficits are appreciated. No gait instability.  No nystagmus. No ataxia on finger to nose testing. Skin:  Skin is warm, dry and intact. No rash noted. Psychiatric: Mood and affect are normal. Speech and behavior are normal.  ____________________________________________   LABS (all labs ordered are listed, but only abnormal results are displayed)  Labs Reviewed  CBC - Abnormal; Notable for the following:       Result Value   WBC 15.0 (*)    All other components within normal limits  BASIC METABOLIC PANEL  CBG MONITORING, ED   ____________________________________________  EKG   ____________________________________________  RADIOLOGY   ____________________________________________   PROCEDURES  Procedure(s) performed:   Epley maneuver attempted but patient only feels minimal relief.  Procedures  Critical Care performed:   ____________________________________________   INITIAL IMPRESSION / ASSESSMENT AND PLAN / ED COURSE  Pertinent labs & imaging results that were available during my care of the patient were reviewed by me and considered in my medical decision making (see chart for details).  ----------------------------------------- 5:41 PM on 01/20/2017 -----------------------------------------  Patient now asymptomatic after fluids and Valium. She is able to ambulate without any dizziness, nausea or vomiting. She'll be discharged home with Valium breakthrough vertigo. No central symptoms. Likely peripheral vertigo. Will give follow-up with ENT.       ____________________________________________   FINAL CLINICAL IMPRESSION(S) / ED DIAGNOSES  Vertigo.    NEW MEDICATIONS  STARTED DURING THIS VISIT:  New Prescriptions   No medications on file     Note:  This document was prepared using Dragon voice recognition software and may include unintentional dictation errors.    Orbie Pyo, MD 01/20/17 1742  ED ECG REPORT I, Doran Stabler, the attending physician, personally viewed and interpreted this ECG.   Date: 01/20/2017  EKG Time: 1450  Rate: 85  Rhythm: normal sinus rhythm  Axis: Normal  Intervals:none  ST&T Change: No ST segment elevation or depression. No abnormal T-wave inversion.     Orbie Pyo, MD 01/20/17 [phone]

## 2017-01-20 NOTE — ED Notes (Addendum)
Pt began vomiting while getting blood drawn, ODT zofran given; see MAR.

## 2017-01-20 NOTE — ED Notes (Signed)
Pt discharged home after verbalizing understanding of discharge instructions; nad noted. 

## 2017-01-20 NOTE — ED Notes (Signed)
Attempted IV start in right [facility] without success

## 2017-01-26 ENCOUNTER — Ambulatory Visit: Payer: Self-pay | Admitting: Family Medicine

## 2017-02-24 ENCOUNTER — Ambulatory Visit (INDEPENDENT_AMBULATORY_CARE_PROVIDER_SITE_OTHER): Payer: BLUE CROSS/BLUE SHIELD | Admitting: Family Medicine

## 2017-02-24 ENCOUNTER — Encounter: Payer: Self-pay | Admitting: Family Medicine

## 2017-02-24 VITALS — BP 128/84 | HR 97 | Temp 98.4°F | Resp 14 | Wt 214.3 lb

## 2017-02-24 DIAGNOSIS — E039 Hypothyroidism, unspecified: Secondary | ICD-10-CM

## 2017-02-24 DIAGNOSIS — S61217D Laceration without foreign body of left little finger without damage to nail, subsequent encounter: Secondary | ICD-10-CM

## 2017-02-24 DIAGNOSIS — G43109 Migraine with aura, not intractable, without status migrainosus: Secondary | ICD-10-CM | POA: Diagnosis not present

## 2017-02-24 DIAGNOSIS — J309 Allergic rhinitis, unspecified: Secondary | ICD-10-CM | POA: Insufficient documentation

## 2017-02-24 DIAGNOSIS — D72829 Elevated white blood cell count, unspecified: Secondary | ICD-10-CM | POA: Diagnosis not present

## 2017-02-24 DIAGNOSIS — R5383 Other fatigue: Secondary | ICD-10-CM | POA: Diagnosis not present

## 2017-02-24 DIAGNOSIS — E538 Deficiency of other specified B group vitamins: Secondary | ICD-10-CM | POA: Diagnosis not present

## 2017-02-24 DIAGNOSIS — J301 Allergic rhinitis due to pollen: Secondary | ICD-10-CM | POA: Diagnosis not present

## 2017-02-24 DIAGNOSIS — R42 Dizziness and giddiness: Secondary | ICD-10-CM

## 2017-02-24 LAB — CBC WITH DIFFERENTIAL/PLATELET
Basophils Absolute: 0 cells/uL (ref 0–200)
Basophils Relative: 0 %
Eosinophils Absolute: 101 cells/uL (ref 15–500)
Eosinophils Relative: 1 %
HCT: 39.6 % (ref 35.0–45.0)
Hemoglobin: 13.5 g/dL (ref 11.7–15.5)
Lymphocytes Relative: 41 %
Lymphs Abs: 4141 cells/uL — ABNORMAL HIGH (ref 850–3900)
MCH: 31.1 pg (ref 27.0–33.0)
MCHC: 34.1 g/dL (ref 32.0–36.0)
MCV: 91.2 fL (ref 80.0–100.0)
MPV: 9.3 fL (ref 7.5–12.5)
Monocytes Absolute: 808 cells/uL (ref 200–950)
Monocytes Relative: 8 %
Neutro Abs: 5050 cells/uL (ref 1500–7800)
Neutrophils Relative %: 50 %
Platelets: 294 10*3/uL (ref 140–400)
RBC: 4.34 MIL/uL (ref 3.80–5.10)
RDW: 13.3 % (ref 11.0–15.0)
WBC: 10.1 10*3/uL (ref 3.8–10.8)

## 2017-02-24 LAB — TSH: TSH: 4.99 mIU/L — ABNORMAL HIGH

## 2017-02-24 LAB — VITAMIN B12: Vitamin B-12: 516 pg/mL (ref 200–1100)

## 2017-02-24 MED ORDER — BECLOMETHASONE DIPROP HFA 80 MCG/ACT IN AERB: 2.0000 | INHALATION_SPRAY | Freq: Two times a day (BID) | RESPIRATORY_TRACT | 11 refills | Status: DC

## 2017-02-24 MED ORDER — SUMATRIPTAN SUCCINATE 100 MG PO TABS: ORAL_TABLET | ORAL | 2 refills | Status: DC

## 2017-02-24 MED ORDER — MONTELUKAST SODIUM 10 MG PO TABS: 10.0000 mg | ORAL_TABLET | Freq: Every day | ORAL | 11 refills | Status: DC

## 2017-02-24 MED ORDER — LEVOTHYROXINE SODIUM 75 MCG PO TABS: ORAL_TABLET | ORAL | 0 refills | Status: DC

## 2017-02-24 NOTE — Patient Instructions (Signed)
We'll have you see the ear nose throat doctor We'll get labs today Start the QVAR inhaler and use twice a day every day, rinse out your mouth after each use Start back on the thyroid medicine, half of a pill daily fo 8 days, then whole pill

## 2017-02-24 NOTE — Assessment & Plan Note (Signed)
Check level 

## 2017-02-24 NOTE — Assessment & Plan Note (Signed)
Refer back to ENT 

## 2017-02-24 NOTE — Progress Notes (Signed)
BP 128/84 (BP Location: Left Arm, Patient Position: Sitting, Cuff Size: Normal)   Pulse 97   Temp 98.4 F (36.9 C) (Oral)   Resp 14   Wt 214 lb 4.8 oz (97.2 kg)   LMP 10/30/2014   SpO2 91%   BMI 32.58 kg/m    Subjective:    Patient ID: Renee Benjamin, female    DOB: June 16, 1981, 36 y.o.   MRN: [id number]  HPI: Renee Benjamin is a 36 y.o. female  Chief Complaint  Patient presents with  . Suture / Staple Removal    Pinky finger   . Cough    Chest congestion, little tightness.     HPI Patient was in the ED at [facility], [place] a laceration of the finger of the left hand, no foreign body, no damage to nail Date was 02/14/17 She was working in the kitchen and sliced her finger with a knife; rinsed with warm water for five minutes after laceration Five sutures applied, Prolene 4-0 She was already on augmentin at the time of ER visit Tetanus was UTD per their note No numbness, no decreased ROM She showed me pictures of the laceration  The piercings for her migraines work; on both sides; still healing; would like imitrex to have on hand  She has been out of medicines, no thyroid medicines for months  She has levaquin on her medicine list; they gave her tussionex too, couldn't quit coughing; she was on augmentin for 7 days prior, plus prednisone for five days; the sinus infection went into her chest; no chest xray done; it's really bad in her chest; bradford pears  She has had seven episodes of vertigo since January; on meclizine every day, has valium PRN  Depression screen [facility] 2/9 02/24/2017 07/24/2016 04/10/2016 07/13/2015  Decreased Interest 0 0 0 0  Down, Depressed, Hopeless 0 0 0 0  PHQ - 2 Score 0 0 0 0   Relevant past medical, surgical, family and social history reviewed Past Medical History:  Diagnosis Date  . ADHD (attention deficit hyperactivity disorder) 01/09/2016  . Allergy   . Anemia   . Anxiety   . Anxiety    separation anxiety  . Asthma   .  Chronic right hip pain 04/10/2016  . Collagen vascular disease ([place])   . Endometriosis   . Fibromyalgia 01/09/2016  . Headache    otc med prn  . Hypothyroidism   . Plantar fasciitis of left foot 08.29.14   PLANTAR HEEL ,FLUID AROUND MEDIAL BAND  . Polycystic ovarian disease   . Stress fracture 08.15.14   RIGHT   . Thoracic back pain   . Thyroid disease   . Vitamin D deficiency disease    Past Surgical History:  Procedure Laterality Date  . ABDOMINAL HYSTERECTOMY  March 2016   Due to uterus being stiched into C Section incision  . APPENDECTOMY    . APPENDECTOMY  2008  . CESAREAN SECTION    . CESAREAN SECTION  2014  . LAPAROSCOPIC BILATERAL SALPINGECTOMY Bilateral 01/02/2015   Procedure: LAPAROSCOPIC BILATERAL SALPINGECTOMY;  Surgeon: Osborne Oman, MD;  Location: [facility];  Service: Gynecology;  Laterality: Bilateral;  . LAPAROSCOPIC HYSTERECTOMY N/A 01/02/2015   Procedure: HYSTERECTOMY TOTAL LAPAROSCOPIC;  Surgeon: Osborne Oman, MD;  Location: [facility];  Service: Gynecology;  Laterality: N/A;  . SHOULDER SURGERY Right   . SHOULDER SURGERY  2001   right   Family History  Problem Relation Age of Onset  .  Diabetes Father   . Osteoporosis Mother   . Cancer Maternal Grandmother     breast  . Osteoporosis Maternal Grandmother    Social History  Substance Use Topics  . Smoking status: Former Smoker    Packs/day: 0.25    Years: 4.00    Types: Cigarettes    Quit date: 04/17/2012  . Smokeless tobacco: Never Used  . Alcohol use No    Interim medical history since last visit reviewed. Allergies and medications reviewed  Review of Systems Per HPI unless specifically indicated above     Objective:    BP 128/84 (BP Location: Left Arm, Patient Position: Sitting, Cuff Size: Normal)   Pulse 97   Temp 98.4 F (36.9 C) (Oral)   Resp 14   Wt 214 lb 4.8 oz (97.2 kg)   LMP 10/30/2014   SpO2 91%   BMI 32.58 kg/m   Wt Readings from Last 3 Encounters:  02/24/17 214 lb 4.8 oz  (97.2 kg)  01/20/17 220 lb (99.8 kg)  12/30/16 215 lb (97.5 kg)    Physical Exam  Constitutional: She appears well-developed and well-nourished. No distress.  HENT:  Head: Normocephalic and atraumatic.  Right Ear: External ear normal.  Left Ear: External ear normal.  Nose: Nose normal.  Mouth/Throat: Oropharynx is clear and moist. No oropharyngeal exudate.  Eyes: Conjunctivae and EOM are normal. Right eye exhibits no discharge. Left eye exhibits no discharge. No scleral icterus.  Neck: No thyromegaly present.  Cardiovascular: Normal rate, regular rhythm and normal heart sounds.   No murmur heard. Pulmonary/Chest: Effort normal and breath sounds normal. No respiratory distress. She has no wheezes.  Musculoskeletal: Normal range of motion. She exhibits no edema.  Neurological: She is alert. She displays no tremor. No cranial nerve deficit or sensory deficit. She exhibits normal muscle tone.  No facial asymmetry  Skin: Skin is warm and dry. She is not diaphoretic. No pallor.  Laceration site on the left pinky finger extensor surface is C/D/I, five prolene sutures in place; mild erythema, no streaks proximally; no fluctuance, no drainage; cleaned with alcohol, all five sutures removed with suture scissors or #11 blade and pick-ups; tolerated well; covered with antibacterial ointment and bandage  Psychiatric: She has a normal mood and affect. Her behavior is normal. Judgment and thought content normal.      Assessment & Plan:   Problem List Items Addressed This Visit      Cardiovascular and Mediastinum   Migraine headache with aura   Relevant Medications   SUMAtriptan (IMITREX) 100 MG tablet     Respiratory   Allergic rhinitis    Refer back to ENT      Relevant Medications   montelukast (SINGULAIR) 10 MG tablet   Other Relevant Orders   Ambulatory referral to ENT     Endocrine   Hypothyroidism   Relevant Medications   levothyroxine (SYNTHROID, LEVOTHROID) 75 MCG tablet    Other Relevant Orders   TSH (Completed)     Other   Vitamin B12 deficiency    Check level      Relevant Orders   B12 (Completed)    Other Visit Diagnoses    Vertigo    -  Primary   Relevant Orders   Ambulatory referral to ENT   Leukocytosis, unspecified type       Relevant Orders   CBC with Differential/Platelet (Completed)   Fatigue, unspecified type       Relevant Orders   B12 (Completed)   VITAMIN  D 25 Hydroxy (Vit-D Deficiency, Fractures) (Completed)       Follow up plan: Return in about 8 weeks (around 04/21/2017) for twenty minute follow-up with fasting labs.  An after-visit summary was printed and given to the patient at [facility].  Please see the patient instructions which may contain other information and recommendations beyond what is mentioned above in the assessment and plan.  Meds ordered this encounter  Medications  . levofloxacin (LEVAQUIN) 500 MG tablet    Sig: Take 700 mg by mouth daily.  . SUMAtriptan (IMITREX) 100 MG tablet    Sig: Take one at onset of migraine; may repeat in 2 hours if headache persists or recurs, max 2 pills per 24 hours    Dispense:  10 tablet    Refill:  2  . montelukast (SINGULAIR) 10 MG tablet    Sig: Take 1 tablet (10 mg total) by mouth daily.    Dispense:  30 tablet    Refill:  11  . levothyroxine (SYNTHROID, LEVOTHROID) 75 MCG tablet    Sig: One-half of a pill by mouth daily x 8 days, then one pill daily    Dispense:  26 tablet    Refill:  0  . Beclomethasone Diprop HFA (QVAR REDIHALER) 80 MCG/ACT AERB    Sig: Inhale 2 puffs into the lungs 2 (two) times daily.    Dispense:  10.6 g    Refill:  11    Orders Placed This Encounter  Procedures  . CBC with Differential/Platelet  . TSH  . B12  . VITAMIN D 25 Hydroxy (Vit-D Deficiency, Fractures)  . Ambulatory referral to ENT

## 2017-02-25 LAB — VITAMIN D 25 HYDROXY (VIT D DEFICIENCY, FRACTURES): Vit D, 25-Hydroxy: 26 ng/mL — ABNORMAL LOW (ref 30–100)

## 2017-02-27 ENCOUNTER — Encounter: Payer: Self-pay | Admitting: Family Medicine

## 2017-03-02 ENCOUNTER — Encounter: Payer: Self-pay | Admitting: Family Medicine

## 2017-03-02 DIAGNOSIS — H938X9 Other specified disorders of ear, unspecified ear: Secondary | ICD-10-CM

## 2017-03-02 DIAGNOSIS — R42 Dizziness and giddiness: Secondary | ICD-10-CM

## 2017-03-02 DIAGNOSIS — R29898 Other symptoms and signs involving the musculoskeletal system: Secondary | ICD-10-CM

## 2017-03-06 NOTE — Addendum Note (Signed)
Addended by: LADA, Satira Anis on: 03/06/2017 12:02 PM   Modules accepted: Orders

## 2017-03-06 NOTE — Addendum Note (Signed)
Addended by: Xiara Knisley, Satira Anis on: 03/06/2017 02:08 PM   Modules accepted: Orders

## 2017-03-10 NOTE — Telephone Encounter (Signed)
I put in a referral to ENT for vertigo on 02/24/17; please read patient's MyChart note and see if you can get her in somewhere ASAP She needs to be seen quickly Thank you

## 2017-03-17 ENCOUNTER — Ambulatory Visit: Payer: BLUE CROSS/BLUE SHIELD | Attending: Family Medicine | Admitting: Physical Therapy

## 2017-03-19 ENCOUNTER — Encounter: Payer: Self-pay | Admitting: Family Medicine

## 2017-03-23 NOTE — Telephone Encounter (Signed)
Please refer to doctor in her network; thank you

## 2017-03-24 ENCOUNTER — Encounter: Payer: BLUE CROSS/BLUE SHIELD | Admitting: Physical Therapy

## 2017-03-31 ENCOUNTER — Encounter: Payer: BLUE CROSS/BLUE SHIELD | Admitting: Physical Therapy

## 2017-04-07 ENCOUNTER — Encounter: Payer: BLUE CROSS/BLUE SHIELD | Admitting: Physical Therapy

## 2017-04-14 ENCOUNTER — Encounter: Payer: Self-pay | Admitting: Physical Therapy

## 2017-04-21 ENCOUNTER — Encounter: Payer: Self-pay | Admitting: Physical Therapy

## 2017-04-21 ENCOUNTER — Ambulatory Visit: Payer: Self-pay | Admitting: Family Medicine

## 2017-04-28 ENCOUNTER — Encounter: Payer: Self-pay | Admitting: Physical Therapy

## 2017-05-11 ENCOUNTER — Encounter: Payer: Self-pay | Admitting: Family Medicine

## 2017-05-11 ENCOUNTER — Ambulatory Visit (INDEPENDENT_AMBULATORY_CARE_PROVIDER_SITE_OTHER): Payer: BLUE CROSS/BLUE SHIELD | Admitting: Family Medicine

## 2017-05-11 VITALS — BP 120/84 | HR 110 | Temp 101.0°F | Resp 18 | Ht 68.0 in | Wt 202.8 lb

## 2017-05-11 DIAGNOSIS — B379 Candidiasis, unspecified: Secondary | ICD-10-CM | POA: Diagnosis not present

## 2017-05-11 DIAGNOSIS — J029 Acute pharyngitis, unspecified: Secondary | ICD-10-CM | POA: Diagnosis not present

## 2017-05-11 DIAGNOSIS — J039 Acute tonsillitis, unspecified: Secondary | ICD-10-CM

## 2017-05-11 DIAGNOSIS — T3695XA Adverse effect of unspecified systemic antibiotic, initial encounter: Secondary | ICD-10-CM

## 2017-05-11 LAB — POCT RAPID STREP A (OFFICE): Rapid Strep A Screen: NEGATIVE

## 2017-05-11 MED ORDER — MAGIC MOUTHWASH W/LIDOCAINE: 5.0000 mL | Freq: Three times a day (TID) | ORAL | 0 refills | Status: DC | PRN

## 2017-05-11 MED ORDER — AMOXICILLIN-POT CLAVULANATE 875-125 MG PO TABS: 1.0000 | ORAL_TABLET | Freq: Two times a day (BID) | ORAL | 0 refills | Status: DC

## 2017-05-11 MED ORDER — FLUCONAZOLE 150 MG PO TABS: 150.0000 mg | ORAL_TABLET | Freq: Once | ORAL | 1 refills | Status: AC

## 2017-05-11 NOTE — Patient Instructions (Addendum)
-  Take Tylenol 650mg  every 6 hours, do not exceed 3000mg  of Tylenol in a 24-hour period. -Stay really well hydrated. -Use humidifier at home. - Take a probiotic or eat yogurt with probiotic while taking antibiotic and for 7 days after you finish. - May use Diflucan if you develop yeast infection while taking antibiotic.   Tonsillitis Tonsillitis is an infection of the throat. This infection causes the tonsils to become red, tender, and swollen. Tonsils are tissues in the back of your throat. If bacteria caused your infection, antibiotic medicine will be given to you. Sometimes, symptoms of this infection can be helped with the use of steroid medicine. If your tonsillitis is very bad (severe) and happens often, you may need to get your tonsils removed (tonsillectomy). Follow these instructions at home: Medicines  Take over-the-counter and prescription medicines only as told by your doctor.  If you were prescribed an antibiotic, take it as told by your doctor. Do not stop taking the antibiotic even if you start to feel better. Eating and drinking  Drink enough fluid to keep your pee (urine) clear or pale yellow.  While your throat is sore, eat soft or liquid foods like: ? Soup. ? Sherbert. ? Instant breakfast drinks.  Drink warm fluids.  Eat frozen ice pops. General instructions  Rest as much as possible and get plenty of sleep.  Gargle with a salt-water mixture 3-4 times a day or as needed. To make a salt-water mixture, completely dissolve -1 tsp of salt in 1 cup of warm water.  Wash your hands often with soap and water. If there is no soap and water, use hand sanitizer.  Do not share cups, bottles, or other utensils until your symptoms are gone.  Do not smoke. If you need help quitting, ask your doctor.  Keep all follow-up visits as told by your doctor. This is important. Contact a doctor if:  You have large, tender lumps in your neck.  You have a fever that does not go  away after 2-3 days.  You have a rash.  You cough up green, yellow-brown, or bloody fluid.  You cannot swallow liquids or food for 24 hours.  Only one of your tonsils is swollen. Get help right away if:  You have any new symptoms such as: ? Vomiting ? Very bad headache ? Stiff neck ? Chest pain ? Trouble breathing or swallowing  You have very bad throat pain and also have drooling or voice changes.  You have very bad pain that is not helped by medicine.  You cannot fully open your mouth.  You have redness, swelling, or severe pain anywhere in your neck. Summary  Tonsillitis causes your tonsils to be red, tender, and swollen.  While your throat is sore eat soft or liquid foods.  Gargle with a salt-water mixture 3-4 times a day or as needed.  Do not share cups, bottles, or other utensils until your symptoms are gone. This information is not intended to replace advice given to you by your health care provider. Make sure you discuss any questions you have with your health care provider. Document Released: 04/07/2008 Document Revised: 03/27/2016 Document Reviewed: 04/08/2013 Elsevier Interactive Patient Education  2017 [employer].

## 2017-05-11 NOTE — Progress Notes (Addendum)
Name: Renee Benjamin   MRN: [id number]    DOB: 08-11-1981   Date:05/11/2017       Progress Note  Subjective  Chief Complaint  Chief Complaint  Patient presents with  . Sore Throat    bodyaches, fever 102  for 2 days    HPI  Pt presents with 2 day history of very sore throat, fevers (up to 102F at home), body aches/myalgais/arthralgias, mild cough intermittently, significant fatigue; passed a tonsil stone 3-4 days ago, pt gets these all the time. Has been around co-workers with strep throat and a flu-like illness recently.  No NVD, shortness of breath, chest pain, abdominal pain, nasal congestion, ear pain/pressure. Has taken Tylenol 2 hours ago, still has fever in office.  Patient Active Problem List   Diagnosis Date Noted  . Allergic rhinitis 02/24/2017  . Vitamin B12 deficiency 02/24/2017  . Chronic right hip pain 04/10/2016  . Controlled substance agreement signed 02/01/2016  . Obesity 02/01/2016  . ADHD (attention deficit hyperactivity disorder) 01/09/2016  . Fibromyalgia 01/09/2016  . Torticollis, acute 10/08/2015  . Headache 10/02/2015  . Thoracic back pain 10/02/2015  . Lumbar pain 10/02/2015  . Arthralgia 10/02/2015  . Chronic tonsillitis 08/20/2015  . Migraine headache with aura 04/10/2015  . Hypothyroidism 04/10/2015  . PTSD (post-traumatic stress disorder) 04/10/2015  . Status post laparoscopic hysterectomy 01/03/2015  . Chronic female pelvic pain 11/23/2014  . Plantar fasciitis of left foot   . Stress fracture     Social History  Substance Use Topics  . Smoking status: Former Smoker    Packs/day: 0.25    Years: 4.00    Types: Cigarettes    Quit date: 04/17/2012  . Smokeless tobacco: Never Used  . Alcohol use No     Current Outpatient Prescriptions:  .  albuterol (PROVENTIL HFA;VENTOLIN HFA) 108 (90 BASE) MCG/ACT inhaler, Inhale 2 puffs into the lungs every 6 (six) hours as needed for wheezing or shortness of breath., Disp: , Rfl:  .  Beclomethasone  Diprop HFA (QVAR REDIHALER) 80 MCG/ACT AERB, Inhale 2 puffs into the lungs 2 (two) times daily., Disp: 10.6 g, Rfl: 11 .  budesonide (RHINOCORT AQUA) 32 MCG/ACT nasal spray, Place 2 sprays into both nostrils daily., Disp: 1 Bottle, Rfl: 1 .  chlorpheniramine-HYDROcodone (TUSSIONEX PENNKINETIC ER) 10-8 MG/5ML SUER, Take 5 mLs by mouth at bedtime as needed for cough., Disp: 115 mL, Rfl: 0 .  Cyanocobalamin (RA VITAMIN B-12 TR) 1000 MCG TBCR, Take 1,000 mcg by mouth daily., Disp: , Rfl:  .  diazepam (VALIUM) 5 MG tablet, Take 1 tablet (5 mg total) by mouth every 6 (six) hours as needed (vertigo)., Disp: 6 tablet, Rfl: 0 .  fexofenadine (ALLEGRA) 180 MG tablet, Take 180 mg by mouth daily., Disp: , Rfl:  .  levothyroxine (SYNTHROID, LEVOTHROID) 75 MCG tablet, One-half of a pill by mouth daily x 8 days, then one pill daily, Disp: 26 tablet, Rfl: 0 .  meclizine (ANTIVERT) 25 MG tablet, Take 1 tablet (25 mg total) by mouth 3 (three) times daily as needed for dizziness., Disp: , Rfl: 0 .  montelukast (SINGULAIR) 10 MG tablet, Take 1 tablet (10 mg total) by mouth daily., Disp: 30 tablet, Rfl: 11 .  SUMAtriptan (IMITREX) 100 MG tablet, Take one at onset of migraine; may repeat in 2 hours if headache persists or recurs, max 2 pills per 24 hours, Disp: 10 tablet, Rfl: 2  Allergies  Allergen Reactions  . Depakote [Divalproex Sodium] Hives  . Morphine  Hives  . Propoxyphene Other (See Comments)  . Tramadol Nausea Only  . Tramadol Nausea And Vomiting and Other (See Comments)  . Darvocet [Propoxyphene N-Acetaminophen] Rash  . Valproic Acid Rash    ROS  Ten systems reviewed and is negative except as mentioned in HPI  Objective  Vitals:   05/11/17 1011  BP: 120/84  Pulse: (!) 110  Resp: 18  Temp: (!) 101 F (38.3 C)  TempSrc: Oral  SpO2: 95%  Weight: 202 lb 12.8 oz (92 kg)  Height: '5\' 8"'  (1.727 m)   Body mass index is 30.84 kg/m.  Nursing Note and Vital Signs reviewed. Elevated HR likely  secondary to infection and fever of 101.  Physical Exam  Constitutional: Patient appears well-developed and well-nourished. Obese No distress.  HEENT: head atraumatic, normocephalic, pupils equal and reactive to light, EOM's intact, TM's without erythema or bulging, no maxillary or frontal sinus pain on palpation, neck supple with bilateral lymphadenopathy, oropharynx erythematous and moist with white exudate bilaterally, Tonsils +2 swelling. Cardiovascular: Normal rate, regular rhythm, S1/S2 present.  No murmur or rub heard. No BLE edema. Pulmonary/Chest: Effort normal and breath sounds clear. No respiratory distress or retractions. Psychiatric: Patient has a normal mood and affect. behavior is normal. Judgment and thought content normal.  Recent Results (from the past 2160 hour(s))  CBC with Differential/Platelet     Status: Abnormal   Collection Time: 02/24/17  4:35 PM  Result Value Ref Range   WBC 10.1 3.8 - 10.8 K/uL   RBC 4.34 3.80 - 5.10 MIL/uL   Hemoglobin 13.5 11.7 - 15.5 g/dL   HCT 39.6 35.0 - 45.0 %   MCV 91.2 80.0 - 100.0 fL   MCH 31.1 27.0 - 33.0 pg   MCHC 34.1 32.0 - 36.0 g/dL   RDW 13.3 11.0 - 15.0 %   Platelets 294 140 - 400 K/uL   MPV 9.3 7.5 - 12.5 fL   Neutro Abs 5,050 1,500 - 7,800 cells/uL   Lymphs Abs 4,141 (H) 850 - 3,900 cells/uL   Monocytes Absolute 808 200 - 950 cells/uL   Eosinophils Absolute 101 15 - 500 cells/uL   Basophils Absolute 0 0 - 200 cells/uL   Neutrophils Relative % 50 %   Lymphocytes Relative 41 %   Monocytes Relative 8 %   Eosinophils Relative 1 %   Basophils Relative 0 %   Smear Review Criteria for review not met   TSH     Status: Abnormal   Collection Time: 02/24/17  4:35 PM  Result Value Ref Range   TSH 4.99 (H) mIU/L    Comment:   Reference Range   > or = 20 Years  0.40-4.50   Pregnancy Range First trimester  0.26-2.66 Second trimester 0.55-2.73 Third trimester  0.43-2.91     B12     Status: None   Collection Time:  02/24/17  4:35 PM  Result Value Ref Range   Vitamin B-12 516 200 - 1,100 pg/mL  VITAMIN D 25 Hydroxy (Vit-D Deficiency, Fractures)     Status: Abnormal   Collection Time: 02/24/17  4:35 PM  Result Value Ref Range   Vit D, 25-Hydroxy 26 (L) 30 - 100 ng/mL    Comment: Vitamin D Status           25-OH Vitamin D        Deficiency                <20 ng/mL  Insufficiency         20 - 29 ng/mL        Optimal             > or = 30 ng/mL   For 25-OH Vitamin D testing on patients on D2-supplementation and patients for whom quantitation of D2 and D3 fractions is required, the [facility] 25-OH VIT D, (D2,D3), LC/MS/MS is recommended: order code [id number] (patients > 2 yrs).   POCT rapid strep A     Status: None   Collection Time: 05/11/17 10:13 AM  Result Value Ref Range   Rapid Strep A Screen Negative Negative     Assessment & Plan  1. Tonsillitis with exudate - amoxicillin-clavulanate (AUGMENTIN) 875-125 MG tablet; Take 1 tablet by mouth 2 (two) times daily.  Dispense: 20 tablet; Refill: 0 - magic mouthwash w/lidocaine SOLN; Take 5 mLs by mouth 3 (three) times daily as needed for mouth pain.  Dispense: 50 mL; Refill: 0  2. Sore throat - POCT rapid strep A - magic mouthwash w/lidocaine SOLN; Take 5 mLs by mouth 3 (three) times daily as needed for mouth pain.  Dispense: 50 mL; Refill: 0  3. Antibiotic induced yeast infection  - fluconazole (DIFLUCAN) 150 MG tablet; Take 1 tablet (150 mg total) by mouth once.  Dispense: 1 tablet; Refill: 1 - Pt is very prone to vaginal yeast infections when on antibiotics and requests Diflucan on hand in case this occurs.   - Work note for today and tomorrow provided. -Red flags and when to present for emergency care or RTC including fever >101.32F, chest pain, shortness of breath, new/worsening/un-resolving symptoms, unable to swallow, throat closing/swelling sensation reviewed with patient at time of visit. Follow up and care instructions discussed  and provided in AVS.  I have reviewed this encounter including the documentation in this note and/or discussed this patient with the Johney Maine, FNP, NP-C. I am certifying that I agree with the content of this note as supervising physician.  Steele Sizer, MD [facility] 05/11/2017, 10:57 AM

## 2017-05-12 ENCOUNTER — Encounter: Payer: Self-pay | Admitting: Family Medicine

## 2017-05-12 DIAGNOSIS — J3501 Chronic tonsillitis: Secondary | ICD-10-CM

## 2017-05-12 NOTE — Assessment & Plan Note (Signed)
Refer to ENT

## 2017-05-13 ENCOUNTER — Encounter: Payer: Self-pay | Admitting: Family Medicine

## 2017-05-13 MED ORDER — IBUPROFEN 600 MG PO TABS: 600.0000 mg | ORAL_TABLET | Freq: Four times a day (QID) | ORAL | 0 refills | Status: DC | PRN

## 2017-05-28 ENCOUNTER — Encounter: Payer: Self-pay | Admitting: Family Medicine

## 2017-06-05 ENCOUNTER — Encounter: Payer: Self-pay | Admitting: Family Medicine

## 2017-06-05 ENCOUNTER — Ambulatory Visit (INDEPENDENT_AMBULATORY_CARE_PROVIDER_SITE_OTHER): Payer: BLUE CROSS/BLUE SHIELD | Admitting: Family Medicine

## 2017-06-05 VITALS — BP 124/60 | HR 79 | Temp 97.6°F | Resp 16 | Ht 68.0 in | Wt 211.5 lb

## 2017-06-05 DIAGNOSIS — J40 Bronchitis, not specified as acute or chronic: Secondary | ICD-10-CM | POA: Diagnosis not present

## 2017-06-05 DIAGNOSIS — J0100 Acute maxillary sinusitis, unspecified: Secondary | ICD-10-CM

## 2017-06-05 MED ORDER — BECLOMETHASONE DIPROP HFA 80 MCG/ACT IN AERB: 2.0000 | INHALATION_SPRAY | Freq: Two times a day (BID) | RESPIRATORY_TRACT | 11 refills | Status: DC

## 2017-06-05 MED ORDER — ALBUTEROL SULFATE HFA 108 (90 BASE) MCG/ACT IN AERS: 2.0000 | INHALATION_SPRAY | Freq: Four times a day (QID) | RESPIRATORY_TRACT | 0 refills | Status: DC | PRN

## 2017-06-05 MED ORDER — PREDNISONE 10 MG PO TABS: 10.0000 mg | ORAL_TABLET | Freq: Every day | ORAL | 0 refills | Status: DC

## 2017-06-05 MED ORDER — CEFDINIR 300 MG PO CAPS: 300.0000 mg | ORAL_CAPSULE | Freq: Two times a day (BID) | ORAL | 0 refills | Status: AC

## 2017-06-05 NOTE — Patient Instructions (Addendum)
Upper Respiratory Infection, Adult Most upper respiratory infections (URIs) are caused by a virus. A URI affects the nose, throat, and upper air passages. The most common type of URI is often called "the common cold." Follow these instructions at home:  Take medicines only as told by your doctor.  Gargle warm saltwater or take cough drops to comfort your throat as told by your doctor.  Use a warm mist humidifier or inhale steam from a shower to increase air moisture. This may make it easier to breathe.  Drink enough fluid to keep your pee (urine) clear or pale yellow.  Eat soups and other clear broths.  Have a healthy diet.  Rest as needed.  Go back to work when your fever is gone or your doctor says it is okay. ? You may need to stay home longer to avoid giving your URI to others. ? You can also wear a face mask and wash your hands often to prevent spread of the virus.  Use your inhaler more if you have asthma.  Do not use any tobacco products, including cigarettes, chewing tobacco, or electronic cigarettes. If you need help quitting, ask your doctor. Contact a doctor if:  You are getting worse, not better.  Your symptoms are not helped by medicine.  You have chills.  You are getting more short of breath.  You have brown or red mucus.  You have yellow or brown discharge from your nose.  You have pain in your face, especially when you bend forward.  You have a fever.  You have puffy (swollen) neck glands.  You have pain while swallowing.  You have white areas in the back of your throat. Get help right away if:  You have very bad or constant: ? Headache. ? Ear pain. ? Pain in your forehead, behind your eyes, and over your cheekbones (sinus pain). ? Chest pain.  You have long-lasting (chronic) lung disease and any of the following: ? Wheezing. ? Long-lasting cough. ? Coughing up blood. ? A change in your usual mucus.  You have a stiff neck.  You have  changes in your: ? Vision. ? Hearing. ? Thinking. ? Mood. This information is not intended to replace advice given to you by your health care provider. Make sure you discuss any questions you have with your health care provider. Document Released: 04/07/2008 Document Revised: 06/22/2016 Document Reviewed: 01/25/2014 Elsevier Interactive Patient Education  2018 [employer].  Acute Bronchitis, Adult Acute bronchitis is when air tubes (bronchi) in the lungs suddenly get swollen. The condition can make it hard to breathe. It can also cause these symptoms:  A cough.  Coughing up clear, yellow, or green mucus.  Wheezing.  Chest congestion.  Shortness of breath.  A fever.  Body aches.  Chills.  A sore throat.  Follow these instructions at home: Medicines  Take over-the-counter and prescription medicines only as told by your doctor.  If you were prescribed an antibiotic medicine, take it as told by your doctor. Do not stop taking the antibiotic even if you start to feel better. General instructions  Rest.  Drink enough fluids to keep your pee (urine) clear or pale yellow.  Avoid smoking and secondhand smoke. If you smoke and you need help quitting, ask your doctor. Quitting will help your lungs heal faster.  Use an inhaler, cool mist vaporizer, or humidifier as told by your doctor.  Keep all follow-up visits as told by your doctor. This is important. How is this  prevented? To lower your risk of getting this condition again:  Wash your hands often with soap and water. If you cannot use soap and water, use hand sanitizer.  Avoid contact with people who have cold symptoms.  Try not to touch your hands to your mouth, nose, or eyes.  Make sure to get the flu shot every year.  Contact a doctor if:  Your symptoms do not get better in 2 weeks. Get help right away if:  You cough up blood.  You have chest pain.  You have very bad shortness of breath.  You become  dehydrated.  You faint (pass out) or keep feeling like you are going to pass out.  You keep throwing up (vomiting).  You have a very bad headache.  Your fever or chills gets worse. This information is not intended to replace advice given to you by your health care provider. Make sure you discuss any questions you have with your health care provider. Document Released: 04/07/2008 Document Revised: 05/28/2016 Document Reviewed: 04/09/2016 Elsevier Interactive Patient Education  2017 [employer].

## 2017-06-05 NOTE — Progress Notes (Addendum)
Name: Renee Benjamin   MRN: [id number]    DOB: [birth year]   Date:06/05/2017       Progress Note  Subjective  Chief Complaint  Chief Complaint  Patient presents with  . URI    cough, congested, pressure, SOB since yesterday    HPI  PT presents with x5 days of congested cough, some shortness of breath, LEFT tonsillar pain (no tonsil stones), nasal congestion, sinus pain/congestion, and ear pain/congestion.  She has been sing her albuterol inhaler for SOB PRN. Has not been using Qvar daily; using rhinocort aqua, singulair, and allegra daily. She has a history of reactive airway/asthma.  Patient Active Problem List   Diagnosis Date Noted  . Allergic rhinitis 02/24/2017  . Vitamin B12 deficiency 02/24/2017  . Chronic right hip pain 04/10/2016  . Controlled substance agreement signed 02/01/2016  . Obesity 02/01/2016  . ADHD (attention deficit hyperactivity disorder) 01/09/2016  . Fibromyalgia 01/09/2016  . Torticollis, acute 10/08/2015  . Headache 10/02/2015  . Thoracic back pain 10/02/2015  . Lumbar pain 10/02/2015  . Arthralgia 10/02/2015  . Chronic tonsillitis 08/20/2015  . Migraine headache with aura 04/10/2015  . Hypothyroidism 04/10/2015  . PTSD (post-traumatic stress disorder) 04/10/2015  . Status post laparoscopic hysterectomy 01/03/2015  . Chronic female pelvic pain 11/23/2014  . Plantar fasciitis of left foot   . Stress fracture     Social History  Substance Use Topics  . Smoking status: Former Smoker    Packs/day: 0.25    Years: 4.00    Types: Cigarettes    Quit date: 04/17/2012  . Smokeless tobacco: Never Used  . Alcohol use No     Current Outpatient Prescriptions:  .  albuterol (PROVENTIL HFA;VENTOLIN HFA) 108 (90 BASE) MCG/ACT inhaler, Inhale 2 puffs into the lungs every 6 (six) hours as needed for wheezing or shortness of breath., Disp: , Rfl:  .  Beclomethasone Diprop HFA (QVAR REDIHALER) 80 MCG/ACT AERB, Inhale 2 puffs into the lungs 2 (two) times  daily., Disp: 10.6 g, Rfl: 11 .  budesonide (RHINOCORT AQUA) 32 MCG/ACT nasal spray, Place 2 sprays into both nostrils daily., Disp: 1 Bottle, Rfl: 1 .  Cyanocobalamin (RA VITAMIN B-12 TR) 1000 MCG TBCR, Take 1,000 mcg by mouth daily., Disp: , Rfl:  .  diazepam (VALIUM) 5 MG tablet, Take 1 tablet (5 mg total) by mouth every 6 (six) hours as needed (vertigo)., Disp: 6 tablet, Rfl: 0 .  fexofenadine (ALLEGRA) 180 MG tablet, Take 180 mg by mouth daily., Disp: , Rfl:  .  ibuprofen (ADVIL,MOTRIN) 600 MG tablet, Take 1 tablet (600 mg total) by mouth every 6 (six) hours as needed., Disp: 30 tablet, Rfl: 0 .  levothyroxine (SYNTHROID, LEVOTHROID) 75 MCG tablet, One-half of a pill by mouth daily x 8 days, then one pill daily, Disp: 26 tablet, Rfl: 0 .  meclizine (ANTIVERT) 25 MG tablet, Take 1 tablet (25 mg total) by mouth 3 (three) times daily as needed for dizziness., Disp: , Rfl: 0 .  montelukast (SINGULAIR) 10 MG tablet, Take 1 tablet (10 mg total) by mouth daily., Disp: 30 tablet, Rfl: 11 .  SUMAtriptan (IMITREX) 100 MG tablet, Take one at onset of migraine; may repeat in 2 hours if headache persists or recurs, max 2 pills per 24 hours, Disp: 10 tablet, Rfl: 2 .  amoxicillin-clavulanate (AUGMENTIN) 875-125 MG tablet, Take 1 tablet by mouth 2 (two) times daily. (Patient not taking: Reported on 06/05/2017), Disp: 20 tablet, Rfl: 0 .  chlorpheniramine-HYDROcodone (TUSSIONEX PENNKINETIC  ER) 10-8 MG/5ML SUER, Take 5 mLs by mouth at bedtime as needed for cough. (Patient not taking: Reported on 06/05/2017), Disp: 115 mL, Rfl: 0 .  magic mouthwash w/lidocaine SOLN, Take 5 mLs by mouth 3 (three) times daily as needed for mouth pain. (Patient not taking: Reported on 06/05/2017), Disp: 50 mL, Rfl: 0  Allergies  Allergen Reactions  . Depakote [Divalproex Sodium] Hives  . Morphine Hives  . Propoxyphene Other (See Comments)  . Tramadol Nausea Only  . Tramadol Nausea And Vomiting and Other (See Comments)  . Darvocet  [Propoxyphene N-Acetaminophen] Rash  . Valproic Acid Rash    ROS  Ten systems reviewed and is negative except as mentioned in HPI  Objective  Vitals:   06/05/17 0830  BP: 124/60  Pulse: 79  Resp: 16  Temp: 97.6 F (36.4 C)  TempSrc: Oral  SpO2: 94%  Weight: 211 lb 8 oz (95.9 kg)  Height: 5\' 8"  (1.727 m)   Body mass index is 32.16 kg/m.  Nursing Note and Vital Signs reviewed.  Physical Exam  Constitutional: Patient appears well-developed and well-nourished. Obese No distress.  HEENT: head atraumatic, normocephalic, pupils equal and reactive to light, EOM's intact, TM's without erythema or bulging, maxillary or frontal sinus pain on palpation bilaterally, neck supple without lymphadenopathy, oropharynx mildly erythematous and moist without exudate, LEFT tonsil +1, RIGHT tonsil WNL. Cardiovascular: Normal rate, regular rhythm, S1/S2 present.  No murmur or rub heard. No BLE edema. Pulmonary/Chest: Effort normal and breath sounds clear - no wheezing/rhonchi/crackles. No respiratory distress or retractions. Psychiatric: Patient has a normal mood and affect. behavior is normal. Judgment and thought content normal.  Recent Results (from the past 2160 hour(s))  POCT rapid strep A     Status: None   Collection Time: 05/11/17 10:13 AM  Result Value Ref Range   Rapid Strep A Screen Negative Negative     Assessment & Plan  1. Bronchitis - albuterol (PROVENTIL HFA;VENTOLIN HFA) 108 (90 Base) MCG/ACT inhaler; Inhale 2 puffs into the lungs every 6 (six) hours as needed for wheezing or shortness of breath.  Dispense: 1 Inhaler; Refill: 0 - beclomethasone (QVAR REDIHALER) 80 MCG/ACT inhaler; Inhale 2 puffs into the lungs 2 (two) times daily.  Dispense: 10.6 g; Refill: 11 - predniSONE (DELTASONE) 10 MG tablet; Take 1 tablet (10 mg total) by mouth daily with breakfast. Day1:6tabs, Day2:5tabs, Day3:4, Day4:3tabs, Day5:2tabs, Day6:1tab  Dispense: 21 tablet; Refill: 0  2. Acute  non-recurrent maxillary sinusitis - cefdinir (OMNICEF) 300 MG capsule; Take 1 capsule (300 mg total) by mouth 2 (two) times daily.  Dispense: 20 capsule; Refill: 0 - Patient was on Augmentin Early July. We will treat with Cefdinir instead for this current infection.  -Work note for today provided. -Red flags and when to present for emergency care or RTC including fever >101.7F, chest pain, shortness of breath unrelieved with medicatoins, new/worsening/un-resolving symptoms, eye pain with movement, eye swelling, tonsil swelling reviewed with patient at time of visit. Follow up and care instructions discussed and provided in AVS.  I have reviewed this encounter including the documentation in this note and/or discussed this patient with the Johney Maine, FNP, NP-C. I am certifying that I agree with the content of this note as supervising physician.  Steele Sizer, MD [facility] 06/05/2017, 1:21 PM

## 2017-06-08 ENCOUNTER — Encounter: Payer: Self-pay | Admitting: Family Medicine

## 2017-06-16 ENCOUNTER — Encounter: Payer: Self-pay | Admitting: Family Medicine

## 2017-06-16 ENCOUNTER — Ambulatory Visit (INDEPENDENT_AMBULATORY_CARE_PROVIDER_SITE_OTHER): Payer: BLUE CROSS/BLUE SHIELD | Admitting: Family Medicine

## 2017-06-16 VITALS — BP 124/82 | HR 76 | Temp 98.2°F | Resp 16 | Ht 68.0 in | Wt 213.7 lb

## 2017-06-16 DIAGNOSIS — H60331 Swimmer's ear, right ear: Secondary | ICD-10-CM | POA: Diagnosis not present

## 2017-06-16 MED ORDER — OFLOXACIN 0.3 % OT SOLN: 10.0000 [drp] | Freq: Every day | OTIC | 0 refills | Status: AC

## 2017-06-16 NOTE — Progress Notes (Addendum)
Name: Renee Benjamin   MRN: [id number]    DOB: 07/07/1981   Date:06/16/2017       Progress Note  Subjective  Chief Complaint  Chief Complaint  Patient presents with  . Otalgia    right ear, poppong started today    HPI  Pt presents with 2 day history of RIGHT ear pain after returning from a trip to the beach. She has a long history of ear infections.  She denies ear itching, endorses some feeling of pressure.  She denies nasal congestion, sinus pain/pressure, chest pain, shortness of breath.  Continues to have intermittent cough from recent bronchitis - worse in the mornings, but is feeling much better overall after this previous illness.  She has been waiting on ENT referral to evaluate for tonsillectomy - needs the referral switched to [facility] so that her insurance will cover - staff message sent to Ms. Saunders Glance, referral coordinator to make this request.  Patient Active Problem List   Diagnosis Date Noted  . Allergic rhinitis 02/24/2017  . Vitamin B12 deficiency 02/24/2017  . Chronic right hip pain 04/10/2016  . Controlled substance agreement signed 02/01/2016  . Obesity 02/01/2016  . ADHD (attention deficit hyperactivity disorder) 01/09/2016  . Fibromyalgia 01/09/2016  . Torticollis, acute 10/08/2015  . Headache 10/02/2015  . Thoracic back pain 10/02/2015  . Lumbar pain 10/02/2015  . Arthralgia 10/02/2015  . Chronic tonsillitis 08/20/2015  . Migraine headache with aura 04/10/2015  . Hypothyroidism 04/10/2015  . PTSD (post-traumatic stress disorder) 04/10/2015  . Status post laparoscopic hysterectomy 01/03/2015  . Chronic female pelvic pain 11/23/2014  . Plantar fasciitis of left foot   . Stress fracture     Social History  Substance Use Topics  . Smoking status: Former Smoker    Packs/day: 0.25    Years: 4.00    Types: Cigarettes    Quit date: 04/17/2012  . Smokeless tobacco: Never Used  . Alcohol use No     Current Outpatient Prescriptions:   .  albuterol (PROVENTIL HFA;VENTOLIN HFA) 108 (90 Base) MCG/ACT inhaler, Inhale 2 puffs into the lungs every 6 (six) hours as needed for wheezing or shortness of breath., Disp: 1 Inhaler, Rfl: 0 .  beclomethasone (QVAR REDIHALER) 80 MCG/ACT inhaler, Inhale 2 puffs into the lungs 2 (two) times daily., Disp: 10.6 g, Rfl: 11 .  budesonide (RHINOCORT AQUA) 32 MCG/ACT nasal spray, Place 2 sprays into both nostrils daily., Disp: 1 Bottle, Rfl: 1 .  chlorpheniramine-HYDROcodone (TUSSIONEX PENNKINETIC ER) 10-8 MG/5ML SUER, Take 5 mLs by mouth at bedtime as needed for cough., Disp: 115 mL, Rfl: 0 .  diazepam (VALIUM) 5 MG tablet, Take 1 tablet (5 mg total) by mouth every 6 (six) hours as needed (vertigo)., Disp: 6 tablet, Rfl: 0 .  fexofenadine (ALLEGRA) 180 MG tablet, Take 180 mg by mouth daily., Disp: , Rfl:  .  ibuprofen (ADVIL,MOTRIN) 600 MG tablet, Take 1 tablet (600 mg total) by mouth every 6 (six) hours as needed., Disp: 30 tablet, Rfl: 0 .  levothyroxine (SYNTHROID, LEVOTHROID) 75 MCG tablet, One-half of a pill by mouth daily x 8 days, then one pill daily, Disp: 26 tablet, Rfl: 0 .  magic mouthwash w/lidocaine SOLN, Take 5 mLs by mouth 3 (three) times daily as needed for mouth pain., Disp: 50 mL, Rfl: 0 .  meclizine (ANTIVERT) 25 MG tablet, Take 1 tablet (25 mg total) by mouth 3 (three) times daily as needed for dizziness., Disp: , Rfl: 0 .  SUMAtriptan (  IMITREX) 100 MG tablet, Take one at onset of migraine; may repeat in 2 hours if headache persists or recurs, max 2 pills per 24 hours, Disp: 10 tablet, Rfl: 2 .  Cyanocobalamin (RA VITAMIN B-12 TR) 1000 MCG TBCR, Take 1,000 mcg by mouth daily., Disp: , Rfl:  .  montelukast (SINGULAIR) 10 MG tablet, Take 1 tablet (10 mg total) by mouth daily., Disp: 30 tablet, Rfl: 11 .  predniSONE (DELTASONE) 10 MG tablet, Take 1 tablet (10 mg total) by mouth daily with breakfast. Day1:6tabs, Day2:5tabs, Day3:4, Day4:3tabs, Day5:2tabs, Day6:1tab (Patient not taking:  Reported on 06/16/2017), Disp: 21 tablet, Rfl: 0  Allergies  Allergen Reactions  . Depakote [Divalproex Sodium] Hives  . Morphine Hives  . Propoxyphene Other (See Comments)  . Tramadol Nausea Only  . Tramadol Nausea And Vomiting and Other (See Comments)  . Darvocet [Propoxyphene N-Acetaminophen] Rash  . Valproic Acid Rash    ROS  Constitutional: Negative for fever or weight change.  Respiratory: Positive for cough and negative for shortness of breath.   Cardiovascular: Negative for chest pain or palpitations.  Gastrointestinal: Negative for abdominal pain, no bowel changes.  Musculoskeletal: Negative for gait problem or joint swelling.  Skin: Negative for rash.  Neurological: Negative for dizziness or headache.  No other specific complaints in a complete review of systems (except as listed in HPI above).  Objective  Vitals:   06/16/17 0847  BP: 124/82  Pulse: 76  Resp: 16  Temp: 98.2 F (36.8 C)  TempSrc: Oral  SpO2: 98%  Weight: 213 lb 11.2 oz (96.9 kg)  Height: 5\' 8"  (1.727 m)   Body mass index is 32.49 kg/m.  Nursing Note and Vital Signs reviewed.  Physical Exam  Constitutional: Patient appears well-developed and well-nourished. Obese No distress.  HEENT: head atraumatic, normocephalic, pupils equal and reactive to light, EOM's intact, LEFT TM non-erythematous, scarring visible; RIGHT canal is inflamed, unable to visualize RIGHT TM.  No maxillary or frontal sinus pain on palpation, neck supple without lymphadenopathy, oropharynx pink and moist without exudate Cardiovascular: Normal rate, regular rhythm, S1/S2 present.  No murmur or rub heard. No BLE edema. Pulmonary/Chest: Effort normal and breath sounds clear. No respiratory distress or retractions. Psychiatric: Patient has a normal mood and affect. behavior is normal. Judgment and thought content normal.  Recent Results (from the past 2160 hour(s))  POCT rapid strep A     Status: None   Collection Time:  05/11/17 10:13 AM  Result Value Ref Range   Rapid Strep A Screen Negative Negative     Assessment & Plan  1. Acute swimmer's ear of right side - ofloxacin (FLOXIN OTIC) 0.3 % OTIC solution; Place 10 drops into the right ear daily.  Dispense: 5 mL; Refill: 0  -Red flags and when to present for emergency care or RTC including fever >101.22F, chest pain, shortness of breath, vertigo symptoms, new/worsening/un-resolving symptoms, reviewed with patient at time of visit. Follow up and care instructions discussed and provided in AVS. I have reviewed this encounter including the documentation in this note and/or discussed this patient with the Johney Maine, FNP, NP-C. I am certifying that I agree with the content of this note as supervising physician.  Steele Sizer, MD [facility] 06/16/2017, 5:09 PM

## 2017-06-16 NOTE — Patient Instructions (Addendum)
Ear Drops, Adult  Your doctor has found that you have a condition that requires you to use ear drops. Ear drops are a medicine that is placed in the ear. This sheet gives you information about how to use this medicine. Your doctor may also give you more specific instructions.  Supplies needed:   Cotton ball.   Medicine.  How to put ear drops into your ear  1. Wash your hands with soap and water.  2. Make sure your ears are clean and dry.  3. Warm the medicine by holding it in your hand for a few minutes.  4. Shake the medicine to mix the ear drops.  5. Use the tube to get the medicine. You will need to squeeze the round part of the tube to do this.  6. Put the drops in your ear as told. Hold the tube above your ear. Do not let the tube touch your ear. The medicine may go in easier if you pull the flap of your ear up and back while you put the drops in.  7. To make sure your ear soaks up the medicine, do one of these things:  ? Lie down for 10 minutes. The ear with the medicine should face up.  ? Put a cotton ball in your ear. Do not push it deeper into your ear. Take out the cotton ball when the drops have been soaked up.  8. If you need to put drops in your other ear, repeat the same steps. Your doctor will tell you if you should put drops in both ears.  Follow these instructions at home:   Use the ear drops for as long as your doctor tells you to. Do not stop even if your symptoms get better.   Keep the ear drops at room temperature.   Keep all follow-up visits as told by your doctor. This is important.  Contact a doctor if:   Your condition gets worse.   Your pain gets worse.   Unusual fluid (drainage) is coming from your ear, especially if the fluid stinks.   You have trouble hearing.  Get help right away if:   You feel like the room is spinning and you feel sick to your stomach. This condition is called vertigo.   The outside of your ear becomes red or swollen.   You have a very bad  headache.  Summary   Ear drops are a medicine that is put in the ear.   Put the drops in your ear as told by your doctor.   Use the ear drops for as long as your doctor tells you to. Do not stop even if your symptoms get better.  This information is not intended to replace advice given to you by your health care provider. Make sure you discuss any questions you have with your health care provider.  Document Released: 04/09/2010 Document Revised: 10/24/2016 Document Reviewed: 10/24/2016  Elsevier Interactive Patient Education  2017 [employer] Inc[employer]  Otitis Externa  Otitis externa is an infection of the outer ear canal. The outer ear canal is the area between the outside of the ear and the eardrum. Otitis externa is sometimes called "swimmer's ear."  Follow these instructions at home:   If you were given antibiotic ear drops, use them as told by your doctor. Do not stop using them even if your condition gets better.   Take over-the-counter and prescription medicines only as told by your doctor.   Keep all follow-up   visits as told by your doctor. This is important.  How is this prevented?   Keep your ear dry. Use the corner of a towel to dry your ear after you swim or bathe.   Try not to scratch or put things in your ear. Doing these things makes it easier for germs to grow in your ear.   Avoid swimming in lakes, dirty water, or pools that may not have the right amount of a chemical called chlorine.   Consider making ear drops and putting 3 or 4 drops in each ear after you swim. Ask your doctor about how you can make ear drops.  Contact a doctor if:   You have a fever.   After 3 days your ear is still red, swollen, or painful.   After 3 days you still have pus coming from your ear.   Your redness, swelling, or pain gets worse.   You have a really bad headache.   You have redness, swelling, pain, or tenderness behind your ear.  This information is not intended to replace advice given to you by your health  care provider. Make sure you discuss any questions you have with your health care provider.  Document Released: 04/07/2008 Document Revised: 11/15/2015 Document Reviewed: 07/30/2015  Elsevier Interactive Patient Education  2018 Elsevier Inc.

## 2017-06-19 ENCOUNTER — Encounter: Payer: Self-pay | Admitting: Family Medicine

## 2017-06-19 ENCOUNTER — Ambulatory Visit (INDEPENDENT_AMBULATORY_CARE_PROVIDER_SITE_OTHER): Payer: BLUE CROSS/BLUE SHIELD | Admitting: Family Medicine

## 2017-06-19 VITALS — BP 118/78 | HR 95 | Temp 98.5°F | Resp 16 | Ht 68.0 in | Wt 213.2 lb

## 2017-06-19 DIAGNOSIS — W57XXXA Bitten or stung by nonvenomous insect and other nonvenomous arthropods, initial encounter: Secondary | ICD-10-CM | POA: Diagnosis not present

## 2017-06-19 DIAGNOSIS — R21 Rash and other nonspecific skin eruption: Secondary | ICD-10-CM | POA: Diagnosis not present

## 2017-06-19 MED ORDER — HYDROXYZINE HCL 25 MG PO TABS: 25.0000 mg | ORAL_TABLET | Freq: Four times a day (QID) | ORAL | 0 refills | Status: DC | PRN

## 2017-06-19 MED ORDER — PREDNISONE 10 MG PO TABS: 10.0000 mg | ORAL_TABLET | Freq: Every day | ORAL | 0 refills | Status: DC

## 2017-06-19 NOTE — Progress Notes (Addendum)
Name: Renee Benjamin   MRN: [id number]    DOB: [birth year]   Date:06/19/2017       Progress Note  Subjective  Chief Complaint  Chief Complaint  Patient presents with  . Rash    bites all over pt thinks it may be bed bug bites    HPI  Pt presents with complaint of progressively worsening rash. She describes the rash as very itchy, raised red "bumps".  Has been taking benadryl, using hydrocortisone cream, using ice packs and taking cold showers without relief.  She recently returned from the beach where she stayed in a different home - concerned that she may have contracted bed bugs. She does not think that she has bed bugs in her home.   No new medications, no new detergents, soaps, foods, etc.  Patient Active Problem List   Diagnosis Date Noted  . Allergic rhinitis 02/24/2017  . Vitamin B12 deficiency 02/24/2017  . Chronic right hip pain 04/10/2016  . Controlled substance agreement signed 02/01/2016  . Obesity 02/01/2016  . ADHD (attention deficit hyperactivity disorder) 01/09/2016  . Fibromyalgia 01/09/2016  . Torticollis, acute 10/08/2015  . Headache 10/02/2015  . Thoracic back pain 10/02/2015  . Lumbar pain 10/02/2015  . Arthralgia 10/02/2015  . Chronic tonsillitis 08/20/2015  . Migraine headache with aura 04/10/2015  . Hypothyroidism 04/10/2015  . PTSD (post-traumatic stress disorder) 04/10/2015  . Status post laparoscopic hysterectomy 01/03/2015  . Chronic female pelvic pain 11/23/2014  . Plantar fasciitis of left foot   . Stress fracture     Social History  Substance Use Topics  . Smoking status: Former Smoker    Packs/day: 0.25    Years: 4.00    Types: Cigarettes    Quit date: 04/17/2012  . Smokeless tobacco: Never Used  . Alcohol use No     Current Outpatient Prescriptions:  .  albuterol (PROVENTIL HFA;VENTOLIN HFA) 108 (90 Base) MCG/ACT inhaler, Inhale 2 puffs into the lungs every 6 (six) hours as needed for wheezing or shortness of breath., Disp: 1  Inhaler, Rfl: 0 .  beclomethasone (QVAR REDIHALER) 80 MCG/ACT inhaler, Inhale 2 puffs into the lungs 2 (two) times daily., Disp: 10.6 g, Rfl: 11 .  budesonide (RHINOCORT AQUA) 32 MCG/ACT nasal spray, Place 2 sprays into both nostrils daily., Disp: 1 Bottle, Rfl: 1 .  chlorpheniramine-HYDROcodone (TUSSIONEX PENNKINETIC ER) 10-8 MG/5ML SUER, Take 5 mLs by mouth at bedtime as needed for cough., Disp: 115 mL, Rfl: 0 .  Cyanocobalamin (RA VITAMIN B-12 TR) 1000 MCG TBCR, Take 1,000 mcg by mouth daily., Disp: , Rfl:  .  diazepam (VALIUM) 5 MG tablet, Take 1 tablet (5 mg total) by mouth every 6 (six) hours as needed (vertigo)., Disp: 6 tablet, Rfl: 0 .  fexofenadine (ALLEGRA) 180 MG tablet, Take 180 mg by mouth daily., Disp: , Rfl:  .  ibuprofen (ADVIL,MOTRIN) 600 MG tablet, Take 1 tablet (600 mg total) by mouth every 6 (six) hours as needed., Disp: 30 tablet, Rfl: 0 .  levothyroxine (SYNTHROID, LEVOTHROID) 75 MCG tablet, One-half of a pill by mouth daily x 8 days, then one pill daily, Disp: 26 tablet, Rfl: 0 .  magic mouthwash w/lidocaine SOLN, Take 5 mLs by mouth 3 (three) times daily as needed for mouth pain., Disp: 50 mL, Rfl: 0 .  meclizine (ANTIVERT) 25 MG tablet, Take 1 tablet (25 mg total) by mouth 3 (three) times daily as needed for dizziness., Disp: , Rfl: 0 .  montelukast (SINGULAIR) 10 MG tablet, Take  1 tablet (10 mg total) by mouth daily., Disp: 30 tablet, Rfl: 11 .  ofloxacin (FLOXIN OTIC) 0.3 % OTIC solution, Place 10 drops into the right ear daily., Disp: 5 mL, Rfl: 0 .  predniSONE (DELTASONE) 10 MG tablet, Take 1 tablet (10 mg total) by mouth daily with breakfast. Day1:6tabs, Day2:5tabs, Day3:4, Day4:3tabs, Day5:2tabs, Day6:1tab (Patient not taking: Reported on 06/16/2017), Disp: 21 tablet, Rfl: 0 .  SUMAtriptan (IMITREX) 100 MG tablet, Take one at onset of migraine; may repeat in 2 hours if headache persists or recurs, max 2 pills per 24 hours, Disp: 10 tablet, Rfl: 2  Allergies  Allergen  Reactions  . Depakote [Divalproex Sodium] Hives  . Morphine Hives  . Propoxyphene Other (See Comments)  . Tramadol Nausea Only  . Tramadol Nausea And Vomiting and Other (See Comments)  . Darvocet [Propoxyphene N-Acetaminophen] Rash  . Valproic Acid Rash    ROS  Constitutional: Negative for fever or weight change.  Respiratory: Negative for cough and shortness of breath.   Cardiovascular: Negative for chest pain or palpitations.  Gastrointestinal: Negative for abdominal pain, no bowel changes.  Musculoskeletal: Negative for gait problem or joint swelling.  Skin: Positive for rash.  Neurological: Negative for dizziness or headache.  No other specific complaints in a complete review of systems (except as listed in HPI above).  Objective  Vitals:   06/19/17 1543  BP: 118/78  Pulse: 95  Resp: 16  Temp: 98.5 F (36.9 C)  SpO2: 98%  Weight: 213 lb 4 oz (96.7 kg)  Height: 5\' 8"  (1.727 m)   Body mass index is 32.42 kg/m.  Nursing Note and Vital Signs reviewed.  Physical Exam  Constitutional: Patient appears well-developed and well-nourished. Obese. No distress.  HEENT: head atraumatic, normocephalic Cardiovascular: Normal rate, regular rhythm, S1/S2 present.  No murmur or rub heard. No BLE edema. Pulmonary/Chest: Effort normal and breath sounds clear. No respiratory distress or retractions. Psychiatric: Patient has a normal mood and affect. behavior is normal. Judgment and thought content normal. Skin: Many papular, erythematous lesions on BUE, BLE, abdomen, back, feet, hands, and chest.  No buttocks, breast, or genital involvement.  Recent Results (from the past 2160 hour(s))  POCT rapid strep A     Status: None   Collection Time: 05/11/17 10:13 AM  Result Value Ref Range   Rapid Strep A Screen Negative Negative     Assessment & Plan  1. Bedbug bite, initial encounter - hydrOXYzine (ATARAX/VISTARIL) 25 MG tablet; Take 1 tablet (25 mg total) by mouth every 6 (six)  hours as needed.  Dispense: 30 tablet; Refill: 0 - predniSONE (DELTASONE) 10 MG tablet; Take 1 tablet (10 mg total) by mouth daily with breakfast. Day1:6tabs, Day2:5tabs, Day3:4tabs, Day3:3tabs, Day2:2tabs, Day6:1tabs  Dispense: 21 tablet; Refill: 0  2. Rash - hydrOXYzine (ATARAX/VISTARIL) 25 MG tablet; Take 1 tablet (25 mg total) by mouth every 6 (six) hours as needed.  Dispense: 30 tablet; Refill: 0 - predniSONE (DELTASONE) 10 MG tablet; Take 1 tablet (10 mg total) by mouth daily with breakfast. Day1:6tabs, Day2:5tabs, Day3:4tabs, Day3:3tabs, Day2:2tabs, Day6:1tabs  Dispense: 21 tablet; Refill: 0  - Tepid baths with colloidal oatmeal. - Return in about 2 days (around 06/21/2017), or if symptoms worsen or fail to improve. -Red flags and when to present for emergency care or RTC including fever >101.51F, chest pain, shortness of breath, throat tightness, new/worsening/un-resolving symptoms, reviewed with patient at time of visit. Follow up and care instructions discussed and provided in AVS.  I have reviewed  this encounter including the documentation in this note and/or discussed this patient with the Johney Maine, FNP, NP-C. I am certifying that I agree with the content of this note as supervising physician.  Steele Sizer, MD [facility] 06/21/2017, 4:15 PM

## 2017-06-19 NOTE — Patient Instructions (Signed)
Colloidal Oatmeal baths - in tepid (lukewarm) water.

## 2017-06-28 ENCOUNTER — Encounter: Payer: Self-pay | Admitting: Family Medicine

## 2017-06-29 ENCOUNTER — Other Ambulatory Visit: Payer: Self-pay | Admitting: Family Medicine

## 2017-06-29 ENCOUNTER — Encounter: Payer: Self-pay | Admitting: Family Medicine

## 2017-06-29 DIAGNOSIS — R21 Rash and other nonspecific skin eruption: Secondary | ICD-10-CM

## 2017-06-29 DIAGNOSIS — B888 Other specified infestations: Secondary | ICD-10-CM

## 2017-06-29 DIAGNOSIS — W57XXXA Bitten or stung by nonvenomous insect and other nonvenomous arthropods, initial encounter: Secondary | ICD-10-CM

## 2017-06-29 MED ORDER — TRIAMCINOLONE ACETONIDE 0.025 % EX OINT: 1.0000 "application " | TOPICAL_OINTMENT | Freq: Two times a day (BID) | CUTANEOUS | 0 refills | Status: DC

## 2017-06-29 MED ORDER — HYDROXYZINE HCL 25 MG PO TABS: 25.0000 mg | ORAL_TABLET | Freq: Four times a day (QID) | ORAL | 0 refills | Status: DC | PRN

## 2017-07-20 ENCOUNTER — Other Ambulatory Visit: Payer: Self-pay

## 2017-07-20 MED ORDER — LEVOTHYROXINE SODIUM 75 MCG PO TABS: 75.0000 ug | ORAL_TABLET | Freq: Every day | ORAL | 0 refills | Status: DC

## 2017-07-28 ENCOUNTER — Encounter: Payer: Self-pay | Admitting: Family Medicine

## 2017-07-28 ENCOUNTER — Other Ambulatory Visit: Payer: Self-pay | Admitting: Family Medicine

## 2017-07-28 ENCOUNTER — Ambulatory Visit (INDEPENDENT_AMBULATORY_CARE_PROVIDER_SITE_OTHER): Payer: BLUE CROSS/BLUE SHIELD | Admitting: Family Medicine

## 2017-07-28 VITALS — BP 122/78 | HR 95 | Temp 98.4°F | Resp 16 | Wt 212.0 lb

## 2017-07-28 DIAGNOSIS — J0100 Acute maxillary sinusitis, unspecified: Secondary | ICD-10-CM

## 2017-07-28 MED ORDER — AZITHROMYCIN 250 MG PO TABS: ORAL_TABLET | ORAL | 0 refills | Status: DC

## 2017-07-28 MED ORDER — FLUCONAZOLE 150 MG PO TABS: 150.0000 mg | ORAL_TABLET | Freq: Once | ORAL | 0 refills | Status: AC

## 2017-07-28 NOTE — Progress Notes (Signed)
BP 122/78   Pulse 95   Temp 98.4 F (36.9 C) (Oral)   Resp 16   Wt 212 lb (96.2 kg)   LMP 10/30/2014   SpO2 95%   BMI 32.23 kg/m    Subjective:    Patient ID: Renee Benjamin, female    DOB: 1980-11-15, 36 y.o.   MRN: [id number]  HPI: Renee Benjamin is a 36 y.o. female  Chief Complaint  Patient presents with  . URI    onset 2 days, chest/nasal congestion,coughing, sore throat    HPI Sick for 2-3 days Really thick whitish and green discharge from nose and coughing stuff up Tried mucinex, nasal spray, allergy medicine Around sick kids Humidity affects her Left is equal to right Little bit of ear pressure No rash No travel  Went to see ENT about her tonsils; going to do surgery later; throat not especially sore today, just irritated  Depression screen [facility] 2/9 07/28/2017 02/24/2017 07/24/2016 04/10/2016 07/13/2015  Decreased Interest 0 0 0 0 0  Down, Depressed, Hopeless 0 0 0 0 0  PHQ - 2 Score 0 0 0 0 0    Relevant past medical, surgical, family and social history reviewed Past Medical History:  Diagnosis Date  . ADHD (attention deficit hyperactivity disorder) 01/09/2016  . Allergy   . Anemia   . Anxiety   . Anxiety    separation anxiety  . Asthma   . Chronic right hip pain 04/10/2016  . Collagen vascular disease ([place])   . Endometriosis   . Fibromyalgia 01/09/2016  . Headache    otc med prn  . Hypothyroidism   . Plantar fasciitis of left foot 08.29.14   PLANTAR HEEL ,FLUID AROUND MEDIAL BAND  . Polycystic ovarian disease   . Stress fracture 08.15.14   RIGHT   . Thoracic back pain   . Thyroid disease   . Vitamin D deficiency disease    Past Surgical History:  Procedure Laterality Date  . ABDOMINAL HYSTERECTOMY  March 2016   Due to uterus being stiched into C Section incision  . APPENDECTOMY    . APPENDECTOMY  2008  . CESAREAN SECTION    . CESAREAN SECTION  2014  . LAPAROSCOPIC BILATERAL SALPINGECTOMY Bilateral 01/02/2015   Procedure: LAPAROSCOPIC  BILATERAL SALPINGECTOMY;  Surgeon: Osborne Oman, MD;  Location: [facility];  Service: Gynecology;  Laterality: Bilateral;  . LAPAROSCOPIC HYSTERECTOMY N/A 01/02/2015   Procedure: HYSTERECTOMY TOTAL LAPAROSCOPIC;  Surgeon: Osborne Oman, MD;  Location: [facility];  Service: Gynecology;  Laterality: N/A;  . SHOULDER SURGERY Right   . SHOULDER SURGERY  2001   right   Family History  Problem Relation Age of Onset  . Diabetes Father   . Osteoporosis Mother   . Cancer Maternal Grandmother        breast  . Osteoporosis Maternal Grandmother    Social History   Social History  . Marital status: Single    Spouse name: N/A  . Number of children: N/A  . Years of education: N/A   Occupational History  . Not on file.   Social History Main Topics  . Smoking status: Former Smoker    Packs/day: 0.25    Years: 4.00    Types: Cigarettes    Quit date: 04/17/2012  . Smokeless tobacco: Never Used  . Alcohol use No  . Drug use: No  . Sexual activity: Yes    Partners: Male    Birth control/ protection: None   Other  Topics Concern  . Not on file   Social History Narrative   ** Merged History Encounter **        Interim medical history since last visit reviewed. Allergies and medications reviewed  Review of Systems Per HPI unless specifically indicated above     Objective:    BP 122/78   Pulse 95   Temp 98.4 F (36.9 C) (Oral)   Resp 16   Wt 212 lb (96.2 kg)   LMP 10/30/2014   SpO2 95%   BMI 32.23 kg/m   Wt Readings from Last 3 Encounters:  07/28/17 212 lb (96.2 kg)  06/19/17 213 lb 4 oz (96.7 kg)  06/16/17 213 lb 11.2 oz (96.9 kg)    Physical Exam  Constitutional: She appears well-developed and well-nourished. No distress.  HENT:  Right Ear: Tympanic membrane and ear canal normal.  Left Ear: Tympanic membrane and ear canal normal.  Nose: Mucosal edema and rhinorrhea present. Right sinus exhibits maxillary sinus tenderness. Right sinus exhibits no frontal sinus  tenderness. Left sinus exhibits maxillary sinus tenderness. Left sinus exhibits no frontal sinus tenderness.  Mouth/Throat: Oropharynx is clear and moist and mucous membranes are normal.  Eyes: EOM are normal. Right eye exhibits no discharge. Left eye exhibits no discharge. No scleral icterus.  Cardiovascular: Normal rate and regular rhythm.   Pulmonary/Chest: Effort normal and breath sounds normal. No respiratory distress. She has no wheezes. She has no rales.  Lymphadenopathy:    She has no cervical adenopathy.    Results for orders placed or performed in visit on 05/11/17  POCT rapid strep A  Result Value Ref Range   Rapid Strep A Screen Negative Negative      Assessment & Plan:   Problem List Items Addressed This Visit    None    Visit Diagnoses    Acute non-recurrent maxillary sinusitis    -  Primary   rest, hydration, antibiotics; diflucan if needed for secondary yeast infection; yogurt to prevent C diff, out of work today and tomorrow   Relevant Medications   azithromycin (ZITHROMAX) 250 MG tablet   fluconazole (DIFLUCAN) 150 MG tablet       Follow up plan: Return if symptoms worsen or fail to improve.  An after-visit summary was printed and given to the patient at [place] [facility].  Please see the patient instructions which may contain other information and recommendations beyond what is mentioned above in the assessment and plan.  Meds ordered this encounter  Medications  . azithromycin (ZITHROMAX) 250 MG tablet    Sig: Two pills by mouth today, then one daily for four more days    Dispense:  6 tablet    Refill:  0  . fluconazole (DIFLUCAN) 150 MG tablet    Sig: Take 1 tablet (150 mg total) by mouth once.    Dispense:  1 tablet    Refill:  0    No orders of the defined types were placed in this encounter.

## 2017-07-28 NOTE — Patient Instructions (Addendum)
Try vitamin C (orange juice if not diabetic or vitamin C tablets) and drink green tea to help your immune system during your illness Get plenty of rest and hydration Please do eat yogurt daily or take a probiotic daily for the next month We want to replace the healthy germs in the gut If you notice foul, watery diarrhea in the next two months, schedule an appointment RIGHT AWAY  Sinusitis, Adult Sinusitis is soreness and inflammation of your sinuses. Sinuses are hollow spaces in the bones around your face. They are located:  Around your eyes.  In the middle of your forehead.  Behind your nose.  In your cheekbones.  Your sinuses and nasal passages are lined with a stringy fluid (mucus). Mucus normally drains out of your sinuses. When your nasal tissues get inflamed or swollen, the mucus can get trapped or blocked so air cannot flow through your sinuses. This lets bacteria, viruses, and funguses grow, and that leads to infection. Follow these instructions at home: Medicines  Take, use, or apply over-the-counter and prescription medicines only as told by your doctor. These may include nasal sprays.  If you were prescribed an antibiotic medicine, take it as told by your doctor. Do not stop taking the antibiotic even if you start to feel better. Hydrate and Humidify  Drink enough water to keep your pee (urine) clear or pale yellow.  Use a cool mist humidifier to keep the humidity level in your home above 50%.  Breathe in steam for 10-15 minutes, 3-4 times a day or as told by your doctor. You can do this in the bathroom while a hot shower is running.  Try not to spend time in cool or dry air. Rest  Rest as much as possible.  Sleep with your head raised (elevated).  Make sure to get enough sleep each night. General instructions  Put a warm, moist washcloth on your face 3-4 times a day or as told by your doctor. This will help with discomfort.  Wash your hands often with soap and  water. If there is no soap and water, use hand sanitizer.  Do not smoke. Avoid being around people who are smoking (secondhand smoke).  Keep all follow-up visits as told by your doctor. This is important. Contact a doctor if:  You have a fever.  Your symptoms get worse.  Your symptoms do not get better within 10 days. Get help right away if:  You have a very bad headache.  You cannot stop throwing up (vomiting).  You have pain or swelling around your face or eyes.  You have trouble seeing.  You feel confused.  Your neck is stiff.  You have trouble breathing. This information is not intended to replace advice given to you by your health care provider. Make sure you discuss any questions you have with your health care provider. Document Released: 04/07/2008 Document Revised: 06/15/2016 Document Reviewed: 08/15/2015 Elsevier Interactive Patient Education  [employer].

## 2017-07-29 MED ORDER — LEVOTHYROXINE SODIUM 75 MCG PO TABS
75.0000 ug | ORAL_TABLET | Freq: Every day | ORAL | 2 refills | Status: DC
Start: 2017-07-29 — End: 2017-12-28

## 2017-09-28 ENCOUNTER — Other Ambulatory Visit: Payer: Self-pay | Admitting: Family Medicine

## 2017-09-28 MED ORDER — FLUTICASONE PROPIONATE HFA 110 MCG/ACT IN AERO: 2.0000 | INHALATION_SPRAY | Freq: Two times a day (BID) | RESPIRATORY_TRACT | 12 refills | Status: DC

## 2017-09-28 NOTE — Progress Notes (Signed)
Changing inhaler based on insurance coverage

## 2017-09-29 ENCOUNTER — Encounter: Payer: Self-pay | Admitting: Family Medicine

## 2017-09-30 MED ORDER — DULOXETINE HCL 30 MG PO CPEP
ORAL_CAPSULE | ORAL | 0 refills | Status: DC
Start: 2017-09-30 — End: 2018-03-12

## 2017-11-25 ENCOUNTER — Ambulatory Visit: Payer: BLUE CROSS/BLUE SHIELD | Admitting: Family Medicine

## 2017-12-03 ENCOUNTER — Ambulatory Visit: Payer: Medicaid Other | Admitting: Family Medicine

## 2017-12-28 ENCOUNTER — Other Ambulatory Visit: Payer: Self-pay | Admitting: Family Medicine

## 2017-12-29 NOTE — Telephone Encounter (Signed)
See my chart message

## 2017-12-29 NOTE — Telephone Encounter (Signed)
Lab Results  Component Value Date   TSH 4.99 (H) 02/24/2017   Please call patient and ask her to schedule an appointment and we'll check labs at that time I'll refill her meds and we look forward to seeing her soon

## 2018-01-21 ENCOUNTER — Emergency Department
Admission: EM | Admit: 2018-01-21 | Discharge: 2018-01-21 | Disposition: A | Discharge status: Home / Self Care | Payer: Medicaid Other | Attending: Emergency Medicine | Admitting: Emergency Medicine

## 2018-01-21 ENCOUNTER — Encounter: Payer: Self-pay | Admitting: Emergency Medicine

## 2018-01-21 ENCOUNTER — Other Ambulatory Visit: Payer: Self-pay

## 2018-01-21 DIAGNOSIS — F341 Dysthymic disorder: Secondary | ICD-10-CM

## 2018-01-21 DIAGNOSIS — Z87891 Personal history of nicotine dependence: Secondary | ICD-10-CM | POA: Diagnosis not present

## 2018-01-21 DIAGNOSIS — F331 Major depressive disorder, recurrent, moderate: Secondary | ICD-10-CM | POA: Diagnosis not present

## 2018-01-21 DIAGNOSIS — F1410 Cocaine abuse, uncomplicated: Secondary | ICD-10-CM

## 2018-01-21 DIAGNOSIS — F419 Anxiety disorder, unspecified: Secondary | ICD-10-CM | POA: Diagnosis not present

## 2018-01-21 DIAGNOSIS — Z79899 Other long term (current) drug therapy: Secondary | ICD-10-CM | POA: Insufficient documentation

## 2018-01-21 DIAGNOSIS — F1310 Sedative, hypnotic or anxiolytic abuse, uncomplicated: Secondary | ICD-10-CM

## 2018-01-21 DIAGNOSIS — E039 Hypothyroidism, unspecified: Secondary | ICD-10-CM | POA: Insufficient documentation

## 2018-01-21 DIAGNOSIS — F1110 Opioid abuse, uncomplicated: Secondary | ICD-10-CM

## 2018-01-21 DIAGNOSIS — F1210 Cannabis abuse, uncomplicated: Secondary | ICD-10-CM

## 2018-01-21 DIAGNOSIS — F4310 Post-traumatic stress disorder, unspecified: Secondary | ICD-10-CM

## 2018-01-21 DIAGNOSIS — J45909 Unspecified asthma, uncomplicated: Secondary | ICD-10-CM | POA: Diagnosis not present

## 2018-01-21 LAB — CBC
HCT: 41.9 % (ref 35.0–47.0)
Hemoglobin: 14.2 g/dL (ref 12.0–16.0)
MCH: 31.6 pg (ref 26.0–34.0)
MCHC: 33.8 g/dL (ref 32.0–36.0)
MCV: 93.5 fL (ref 80.0–100.0)
Platelets: 316 10*3/uL (ref 150–440)
RBC: 4.48 MIL/uL (ref 3.80–5.20)
RDW: 12.3 % (ref 11.5–14.5)
WBC: 8.9 10*3/uL (ref 3.6–11.0)

## 2018-01-21 LAB — URINE DRUG SCREEN, QUALITATIVE (ARMC ONLY)
Amphetamines, Ur Screen: NOT DETECTED
Barbiturates, Ur Screen: POSITIVE — AB
Benzodiazepine, Ur Scrn: POSITIVE — AB
Cannabinoid 50 Ng, Ur ~~LOC~~: POSITIVE — AB
Cocaine Metabolite,Ur ~~LOC~~: POSITIVE — AB
MDMA (Ecstasy)Ur Screen: NOT DETECTED
Methadone Scn, Ur: NOT DETECTED
Opiate, Ur Screen: POSITIVE — AB
Phencyclidine (PCP) Ur S: NOT DETECTED
Tricyclic, Ur Screen: NOT DETECTED

## 2018-01-21 LAB — COMPREHENSIVE METABOLIC PANEL
ALT: 10 U/L — ABNORMAL LOW (ref 14–54)
AST: 21 U/L (ref 15–41)
Albumin: 4.1 g/dL (ref 3.5–5.0)
Alkaline Phosphatase: 77 U/L (ref 38–126)
Anion gap: 10 (ref 5–15)
BUN: 10 mg/dL (ref 6–20)
CO2: 26 mmol/L (ref 22–32)
Calcium: 8.8 mg/dL — ABNORMAL LOW (ref 8.9–10.3)
Chloride: 104 mmol/L (ref 101–111)
Creatinine, Ser: 0.9 mg/dL (ref 0.44–1.00)
GFR calc Af Amer: >60 mL/min (ref >60)
GFR calc non Af Amer: >60 mL/min (ref >60)
Glucose, Bld: 111 mg/dL — ABNORMAL HIGH (ref 65–99)
Potassium: 3.7 mmol/L (ref 3.5–5.1)
Sodium: 140 mmol/L (ref 135–145)
Total Bilirubin: 0.6 mg/dL (ref 0.3–1.2)
Total Protein: 7.4 g/dL (ref 6.5–8.1)

## 2018-01-21 LAB — POCT PREGNANCY, URINE: Preg Test, Ur: NEGATIVE

## 2018-01-21 LAB — ACETAMINOPHEN LEVEL: Acetaminophen (Tylenol), Serum: <10 ug/mL — ABNORMAL LOW (ref 10–30)

## 2018-01-21 LAB — ETHANOL: Alcohol, Ethyl (B): <10 mg/dL (ref <10)

## 2018-01-21 LAB — SALICYLATE LEVEL: Salicylate Lvl: <7 mg/dL (ref 2.8–30.0)

## 2018-01-21 MED ORDER — BUPROPION HCL ER (XL) 150 MG PO TB24: 300.0000 mg | ORAL_TABLET | ORAL | 1 refills | Status: DC
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
## 2018-01-21 NOTE — ED Notes (Signed)
Pt moved to [facility]

## 2018-01-21 NOTE — ED Notes (Signed)
Dr. Clapacs at beside.  

## 2018-01-21 NOTE — ED Notes (Signed)
Pt denies SI/HI/AVH. Pt given discharge instructions including prescriptions. Pt states understanding. Pt states receipt of all belongings.   

## 2018-01-21 NOTE — Discharge Instructions (Addendum)
please continue your medicines. Please follow-up with [facility]. remember you can just walk in there.Contact information ifor them is on the other sheet of paper. please return here for any further problems

## 2018-01-21 NOTE — ED Notes (Signed)
Pt states on cymbalta for her fibromyalgia but is very anxiety and wants something for that but her dr "just tells me to deep breath".

## 2018-01-21 NOTE — BH Assessment (Signed)
Patient seen by Psych MD (Dr. Weber Cooks) and do not meet inpatient criteria and discharged prior to being seen by TTS.

## 2018-01-22 ENCOUNTER — Encounter: Payer: Self-pay | Admitting: Family Medicine

## 2018-01-22 DIAGNOSIS — F1910 Other psychoactive substance abuse, uncomplicated: Secondary | ICD-10-CM | POA: Insufficient documentation

## 2018-01-22 HISTORY — DX: Other psychoactive substance abuse, uncomplicated: F19.10

## 2018-02-11 ENCOUNTER — Other Ambulatory Visit: Payer: Self-pay | Admitting: Family Medicine

## 2018-02-12 NOTE — Telephone Encounter (Signed)
This cymbalta was prescribed in November and is just now being requested for a refill Patient was seen in ER on 01/21/18 and referred to psych She should be getting psych meds from only on monitoring prescriber

## 2018-02-17 ENCOUNTER — Other Ambulatory Visit: Payer: Self-pay | Admitting: Family Medicine

## 2018-02-17 NOTE — Telephone Encounter (Signed)
See note on 02/11/18 refill request Pt was seen in the ER in March, referred to psych Should only be getting psych meds from one prescriber

## 2018-02-18 ENCOUNTER — Other Ambulatory Visit: Payer: Self-pay | Admitting: Family Medicine

## 2018-02-19 ENCOUNTER — Other Ambulatory Visit: Payer: Self-pay

## 2018-02-19 NOTE — Telephone Encounter (Signed)
She still needs to be seen for a prescription That cymbalta Rx was from months ago and I need to coordinate what she's taking with what psych may be prescribing her

## 2018-02-19 NOTE — Telephone Encounter (Signed)
Pt called back states is getting medicaid card changed to you here at [facility], before she can see you.  But states she is taking the Cymbalta due to fibromyalgia so her psch does not prescribe?

## 2018-02-22 NOTE — Telephone Encounter (Signed)
Tried calling pt to schedule an appt Mailbox is full.  °

## 2018-02-23 ENCOUNTER — Other Ambulatory Visit: Payer: Self-pay | Admitting: Family Medicine

## 2018-02-23 DIAGNOSIS — J40 Bronchitis, not specified as acute or chronic: Secondary | ICD-10-CM

## 2018-03-08 ENCOUNTER — Emergency Department: Payer: Medicaid Other

## 2018-03-08 ENCOUNTER — Encounter: Payer: Self-pay | Admitting: Emergency Medicine

## 2018-03-08 ENCOUNTER — Emergency Department
Admission: EM | Admit: 2018-03-08 | Discharge: 2018-03-08 | Disposition: A | Discharge status: Home / Self Care | Payer: Medicaid Other | Attending: Emergency Medicine | Admitting: Emergency Medicine

## 2018-03-08 DIAGNOSIS — J45901 Unspecified asthma with (acute) exacerbation: Secondary | ICD-10-CM | POA: Insufficient documentation

## 2018-03-08 DIAGNOSIS — J45909 Unspecified asthma, uncomplicated: Secondary | ICD-10-CM | POA: Diagnosis not present

## 2018-03-08 DIAGNOSIS — E876 Hypokalemia: Secondary | ICD-10-CM | POA: Diagnosis not present

## 2018-03-08 DIAGNOSIS — Z87891 Personal history of nicotine dependence: Secondary | ICD-10-CM | POA: Insufficient documentation

## 2018-03-08 DIAGNOSIS — E039 Hypothyroidism, unspecified: Secondary | ICD-10-CM | POA: Insufficient documentation

## 2018-03-08 DIAGNOSIS — J301 Allergic rhinitis due to pollen: Secondary | ICD-10-CM

## 2018-03-08 DIAGNOSIS — Z79899 Other long term (current) drug therapy: Secondary | ICD-10-CM | POA: Diagnosis not present

## 2018-03-08 DIAGNOSIS — J209 Acute bronchitis, unspecified: Secondary | ICD-10-CM | POA: Insufficient documentation

## 2018-03-08 DIAGNOSIS — R05 Cough: Secondary | ICD-10-CM | POA: Diagnosis present

## 2018-03-08 LAB — CBC
HCT: 40.3 % (ref 35.0–47.0)
Hemoglobin: 13.9 g/dL (ref 12.0–16.0)
MCH: 32.1 pg (ref 26.0–34.0)
MCHC: 34.6 g/dL (ref 32.0–36.0)
MCV: 92.8 fL (ref 80.0–100.0)
Platelets: 326 10*3/uL (ref 150–440)
RBC: 4.34 MIL/uL (ref 3.80–5.20)
RDW: 12.4 % (ref 11.5–14.5)
WBC: 14.5 10*3/uL — ABNORMAL HIGH (ref 3.6–11.0)

## 2018-03-08 LAB — COMPREHENSIVE METABOLIC PANEL
ALT: 11 U/L — ABNORMAL LOW (ref 14–54)
AST: 13 U/L — ABNORMAL LOW (ref 15–41)
Albumin: 3.7 g/dL (ref 3.5–5.0)
Alkaline Phosphatase: 57 U/L (ref 38–126)
Anion gap: 7 (ref 5–15)
BUN: 15 mg/dL (ref 6–20)
CO2: 31 mmol/L (ref 22–32)
Calcium: 8.4 mg/dL — ABNORMAL LOW (ref 8.9–10.3)
Chloride: 101 mmol/L (ref 101–111)
Creatinine, Ser: 0.81 mg/dL (ref 0.44–1.00)
GFR calc Af Amer: >60 mL/min (ref >60)
GFR calc non Af Amer: >60 mL/min (ref >60)
Glucose, Bld: 96 mg/dL (ref 65–99)
Potassium: 2.8 mmol/L — ABNORMAL LOW (ref 3.5–5.1)
Sodium: 139 mmol/L (ref 135–145)
Total Bilirubin: 0.5 mg/dL (ref 0.3–1.2)
Total Protein: 6.5 g/dL (ref 6.5–8.1)

## 2018-03-08 LAB — BRAIN NATRIURETIC PEPTIDE: B Natriuretic Peptide: 30 pg/mL (ref 0.0–100.0)

## 2018-03-08 LAB — FIBRIN DERIVATIVES D-DIMER (ARMC ONLY): Fibrin derivatives D-dimer (ARMC): 345.09 ng/mL (FEU) (ref 0.00–499.00)

## 2018-03-08 MED ORDER — IPRATROPIUM-ALBUTEROL 0.5-2.5 (3) MG/3ML IN SOLN
3.0000 mL | Freq: Once | RESPIRATORY_TRACT | Status: AC
  Administered 2018-03-08: 3 mL via RESPIRATORY_TRACT
  Filled 2018-03-08: qty 3

## 2018-03-08 MED ORDER — MONTELUKAST SODIUM 10 MG PO TABS: 10.0000 mg | ORAL_TABLET | Freq: Every day | ORAL | 0 refills | Status: DC

## 2018-03-08 MED ORDER — AZITHROMYCIN 250 MG PO TABS: ORAL_TABLET | ORAL | 0 refills | Status: DC

## 2018-03-08 MED ORDER — POTASSIUM CHLORIDE CRYS ER 20 MEQ PO TBCR
40.0000 meq | EXTENDED_RELEASE_TABLET | Freq: Once | ORAL | Status: AC
  Administered 2018-03-08: 40 meq via ORAL
  Filled 2018-03-08: qty 2

## 2018-03-08 MED ORDER — POTASSIUM CHLORIDE ER 10 MEQ PO TBCR: 10.0000 meq | EXTENDED_RELEASE_TABLET | Freq: Two times a day (BID) | ORAL | 0 refills | Status: DC

## 2018-03-08 MED ORDER — TIOTROPIUM BROMIDE MONOHYDRATE 2.5 MCG/ACT IN AERS: 2.5000 ug | INHALATION_SPRAY | Freq: Every day | RESPIRATORY_TRACT | 0 refills | Status: DC

## 2018-03-08 MED ORDER — PREDNISONE 50 MG PO TABS: ORAL_TABLET | ORAL | 0 refills | Status: DC

## 2018-03-08 NOTE — ED Notes (Signed)
See triage note  States she has had some diff breathing and asthma for couple of weeks  States she was placed on prednisone for 5 days with inhalers  Then was seen last week at urgent care and placed on 2nd round of steroids .Marland Kitchen But states she still is having a hard time breathing  Non prod cough for the most part    Occasional prod cough in the mornings  No fever

## 2018-03-08 NOTE — ED Triage Notes (Signed)
Pt reports asthma has been flaring up and she is coughing up thick green sputum. Denies fevers. Pt reports went to [facility] and they gave her steroids but no x-ray and reports she is not feeling better.

## 2018-03-08 NOTE — ED Provider Notes (Signed)
[facility] Emergency Department Provider Note  ____________________________________________  Time seen: Approximately 10:35 AM  I have reviewed the triage vital signs and the nursing notes.   HISTORY  Chief Complaint Asthma and Cough    HPI Renee Benjamin is a 37 y.o. female that presents to the emergency department for evaluation of non productive cough and SOB for 8 days. During the day, cough is a dry nonproductive cough but when she wakes up in the morning she is coughing up green sputum. Patient was started on 40 mg of prednisone 8 days ago from urgent care.  She went back to urgent care on Friday and was started on Augmentin and received a steroid shot and 20mg  of prednisone.  She has been using albuterol inhalers and albuterol nebulizers with minimal relief. She denies fever, chills, palpitations, hemoptysis, chest pain, nausea, vomiting, abdominal pain, muscle aches, leg swelling.   Past Medical History:  Diagnosis Date  . ADHD (attention deficit hyperactivity disorder) 01/09/2016  . Allergy   . Anemia   . Anxiety   . Anxiety    separation anxiety  . Asthma   . Chronic right hip pain 04/10/2016  . Collagen vascular disease ([place])   . Endometriosis   . Fibromyalgia 01/09/2016  . Headache    otc med prn  . Hypothyroidism   . Plantar fasciitis of left foot 08.29.14   PLANTAR HEEL ,FLUID AROUND MEDIAL BAND  . Polycystic ovarian disease   . Polysubstance abuse ([place]) 01/22/2018   ER visit March 2019: Drug screen is positive for marijuana, cocaine, benzodiazepines, barbiturates, and opiates  . Stress fracture 08.15.14   RIGHT   . Thoracic back pain   . Thyroid disease   . Vitamin D deficiency disease     Patient Active Problem List   Diagnosis Date Noted  . Polysubstance abuse ([place]) 01/22/2018  . Moderate recurrent major depression ([place]) 01/21/2018  . Cocaine abuse ([place]) 01/21/2018  . Cannabis abuse 01/21/2018  . Benzodiazepine abuse ([place])  01/21/2018  . Barbiturate abuse ([place]) 01/21/2018  . Opiate abuse, episodic ([place]) 01/21/2018  . Allergic rhinitis 02/24/2017  . Vitamin B12 deficiency 02/24/2017  . Chronic right hip pain 04/10/2016  . Controlled substance agreement signed 02/01/2016  . Obesity 02/01/2016  . ADHD (attention deficit hyperactivity disorder) 01/09/2016  . Fibromyalgia 01/09/2016  . Torticollis, acute 10/08/2015  . Headache 10/02/2015  . Thoracic back pain 10/02/2015  . Lumbar pain 10/02/2015  . Arthralgia 10/02/2015  . Chronic tonsillitis 08/20/2015  . Migraine headache with aura 04/10/2015  . Hypothyroidism 04/10/2015  . PTSD (post-traumatic stress disorder) 04/10/2015  . Status post laparoscopic hysterectomy 01/03/2015  . Chronic female pelvic pain 11/23/2014  . Plantar fasciitis of left foot   . Stress fracture     Past Surgical History:  Procedure Laterality Date  . ABDOMINAL HYSTERECTOMY  March 2016   Due to uterus being stiched into C Section incision  . APPENDECTOMY    . APPENDECTOMY  2008  . CESAREAN SECTION    . CESAREAN SECTION  2014  . LAPAROSCOPIC BILATERAL SALPINGECTOMY Bilateral 01/02/2015   Procedure: LAPAROSCOPIC BILATERAL SALPINGECTOMY;  Surgeon: Osborne Oman, MD;  Location: [facility];  Service: Gynecology;  Laterality: Bilateral;  . LAPAROSCOPIC HYSTERECTOMY N/A 01/02/2015   Procedure: HYSTERECTOMY TOTAL LAPAROSCOPIC;  Surgeon: Osborne Oman, MD;  Location: [facility];  Service: Gynecology;  Laterality: N/A;  . SHOULDER SURGERY Right   . SHOULDER SURGERY  2001   right    Prior  to Admission medications   Medication Sig Start Date End Date Taking? Authorizing Provider  azithromycin (ZITHROMAX Z-PAK) 250 MG tablet Take 2 tablets (500 mg) on  Day 1,  followed by 1 tablet (250 mg) once daily on Days 2 through 5. 03/08/18   Laban Emperor, PA-C  budesonide (RHINOCORT AQUA) 32 MCG/ACT nasal spray Place 2 sprays into both nostrils daily. 08/06/16   Arnetha Courser, MD  buPROPion  (WELLBUTRIN XL) 150 MG 24 hr tablet Take 2 tablets (300 mg total) by mouth every morning. 01/21/18 03/22/18  Clapacs, Madie Reno, MD  Cyanocobalamin (RA VITAMIN B-12 TR) 1000 MCG TBCR Take 1,000 mcg by mouth daily. 12/06/15   [provider]  DULoxetine (CYMBALTA) 30 MG capsule One by mouth daily for one week, then two daily 09/30/17   Lada, Satira Anis, MD  fexofenadine (ALLEGRA) 180 MG tablet Take 180 mg by mouth daily.    [provider]  fluticasone (FLOVENT HFA) 110 MCG/ACT inhaler Inhale 2 puffs into the lungs 2 (two) times daily. (rinse mouth out after each use); this replaces QVAR 09/28/17   Lada, Satira Anis, MD  hydrOXYzine (ATARAX/VISTARIL) 25 MG tablet Take 1-2 tablets (25-50 mg total) by mouth every 6 (six) hours as needed for anxiety or itching. 06/29/17   Hubbard Hartshorn, FNP  levothyroxine (SYNTHROID, LEVOTHROID) 75 MCG tablet TAKE 1 TABLET BY MOUTH ONCE DAILY ON AN EMPTY STOMACH. WAIT 30 MINUTES BEFORE TAKING OTHER MEDS. 12/29/17   Arnetha Courser, MD  meclizine (ANTIVERT) 25 MG tablet Take 1 tablet (25 mg total) by mouth 3 (three) times daily as needed for dizziness. 08/06/16   Lada, Satira Anis, MD  montelukast (SINGULAIR) 10 MG tablet Take 1 tablet (10 mg total) by mouth daily. 03/08/18   Laban Emperor, PA-C  potassium chloride (K-DUR) 10 MEQ tablet Take 1 tablet (10 mEq total) by mouth 2 (two) times daily for 5 days. 03/08/18 03/13/18  Laban Emperor, PA-C  predniSONE (DELTASONE) 50 MG tablet Take 1 tab per day 03/08/18   Laban Emperor, PA-C  PROAIR HFA 108 (90 Base) MCG/ACT inhaler INHALE 2 PUFFS BY MOUTH EVERY 6 HOURS IFNEEDED FOR WHEEZING OR SHORTNESS OF BREATH. 02/23/18   Poulose, Bethel Born, NP  SUMAtriptan (IMITREX) 100 MG tablet Take one at onset of migraine; may repeat in 2 hours if headache persists or recurs, max 2 pills per 24 hours 02/24/17   Lada, Satira Anis, MD  Tiotropium Bromide Monohydrate (SPIRIVA RESPIMAT) 2.5 MCG/ACT AERS Inhale 2.5 mcg into the lungs daily. 03/08/18    Laban Emperor, PA-C    Allergies Depakote [divalproex sodium]; Morphine; Propoxyphene; Tramadol; Tramadol; Darvocet [propoxyphene n-acetaminophen]; and Valproic acid  Family History  Problem Relation Age of Onset  . Diabetes Father   . Osteoporosis Mother   . Cancer Maternal Grandmother        breast  . Osteoporosis Maternal Grandmother     Social History Social History   Tobacco Use  . Smoking status: Former Smoker    Packs/day: 0.25    Years: 4.00    Pack years: 1.00    Types: Cigarettes    Last attempt to quit: 04/17/2012    Years since quitting: 5.8  . Smokeless tobacco: Never Used  Substance Use Topics  . Alcohol use: No    Alcohol/week: 0.0 oz  . Drug use: No     Review of Systems  Constitutional: No fever/chills Eyes: No visual changes. No discharge. ENT: Negative for congestion and rhinorrhea. Cardiovascular: No chest  pain. Respiratory: Positive for cough and SOB. Gastrointestinal: No abdominal pain.  No nausea, no vomiting.  No diarrhea.  No constipation. Musculoskeletal: Negative for musculoskeletal pain. Skin: Negative for rash, abrasions, lacerations, ecchymosis. Neurological: Negative for headaches.   ____________________________________________   PHYSICAL EXAM:  VITAL SIGNS: ED Triage Vitals [03/08/18 0926]  Enc Vitals Group     BP 123/78     Pulse Rate 65     Resp 20     Temp 98 F (36.7 C)     Temp Source Oral     SpO2 97 %     Weight 213 lb (96.6 kg)     Height 5\' 8"  (1.727 m)     Head Circumference      Peak Flow      Pain Score 0     Pain Loc      Pain Edu?      Excl. in [place]?      Constitutional: Alert and oriented. Well appearing and in no acute distress. Eyes: Conjunctivae are normal. PERRL. EOMI. No discharge. Head: Atraumatic. ENT: No frontal and maxillary sinus tenderness.      Ears: Tympanic membranes pearly gray with good landmarks. No discharge.      Nose: No congestion/rhinnorhea.      Mouth/Throat: Mucous  membranes are moist. Oropharynx non-erythematous. Tonsils not enlarged. No exudates. Uvula midline. Neck: No stridor.   Hematological/Lymphatic/Immunilogical: No cervical lymphadenopathy. Cardiovascular: Normal rate, regular rhythm.  Good peripheral circulation. Respiratory: Normal respiratory effort without tachypnea or retractions. Lungs CTAB. Good air entry to the bases with no decreased or absent breath sounds. Gastrointestinal: Bowel sounds 4 quadrants. Soft and nontender to palpation. No guarding or rigidity. No palpable masses. No distention. Musculoskeletal: Full range of motion to all extremities. No gross deformities appreciated. Neurologic:  Normal speech and language. No gross focal neurologic deficits are appreciated.  Skin:  Skin is warm, dry and intact. No rash noted. Psychiatric: Mood and affect are normal. Speech and behavior are normal. Patient exhibits appropriate insight and judgement.   ____________________________________________   LABS (all labs ordered are listed, but only abnormal results are displayed)  Labs Reviewed  COMPREHENSIVE METABOLIC PANEL - Abnormal; Notable for the following components:      Result Value   Potassium 2.8 (*)    Calcium 8.4 (*)    AST 13 (*)    ALT 11 (*)    All other components within normal limits  CBC - Abnormal; Notable for the following components:   WBC 14.5 (*)    All other components within normal limits  BRAIN NATRIURETIC PEPTIDE  FIBRIN DERIVATIVES D-DIMER ([facility] ONLY)   ____________________________________________  EKG  NSR  ____________________________________________  RADIOLOGY Robinette Haines, personally viewed and evaluated these images (plain radiographs) as part of my medical decision making, as well as reviewing the written report by the radiologist.  Dg Chest 2 View  Result Date: 03/08/2018 CLINICAL DATA:  Productive cough, coughing up thick green sputum, history asthma flaring up EXAM: CHEST - 2 VIEW  COMPARISON:  07/15/2011 FINDINGS: Minimal enlargement of cardiac silhouette. Mediastinal contours and pulmonary vascularity normal. Lungs clear. No pulmonary infiltrate, pleural effusion or pneumothorax. Mild pectus excavatum again seen. No acute osseous findings. IMPRESSION: Minimal enlargement of cardiac silhouette. No acute abnormalities. Electronically Signed   By: Lavonia Dana M.D.   On: 03/08/2018 10:06    ____________________________________________    PROCEDURES  Procedure(s) performed:    Procedures    Medications  ipratropium-albuterol (DUONEB) 0.5-2.5 (3)  MG/3ML nebulizer solution 3 mL (3 mLs Nebulization Given 03/08/18 1049)  potassium chloride SA (K-DUR,KLOR-CON) CR tablet 40 mEq (40 mEq Oral Given 03/08/18 1144)  ipratropium-albuterol (DUONEB) 0.5-2.5 (3) MG/3ML nebulizer solution 3 mL (3 mLs Nebulization Given 03/08/18 1235)     ____________________________________________   INITIAL IMPRESSION / ASSESSMENT AND PLAN / ED COURSE  Pertinent labs & imaging results that were available during my care of the patient were reviewed by me and considered in my medical decision making (see chart for details).  Review of the [place] CSRS was performed in accordance of the [place] prior to dispensing any controlled drugs.     Patient's diagnosis is consistent with hypokalemia, asthma exacerbation, bronchitis. Vital signs and exam are reassuring. No infilitrate on xray. Findings were discussed with patient. Ddimer and BNP within normal limits. CMP remarkable for potassium 2.8. No EKG changes or body aches. EKG shows NSR. Cough and SOB improved with duoneb. Antibiotics will be changed from Augmentin to azithromycin. Steroid dose will be increased. Patient and plan of care was discussed with Dr. Mable Paris and he is in agreement with plan. Patient feels comfortable going home. Patient will be discharged home with prescriptions for prednisone, azithromycin, singular, spiriva, potassium. Patient is to  follow up with PCP in 2 days. Patient is given ED precautions to return to the ED for any worsening or new symptoms.     ____________________________________________  FINAL CLINICAL IMPRESSION(S) / ED DIAGNOSES  Final diagnoses:  Hypokalemia  Exacerbation of asthma, unspecified asthma severity, unspecified whether persistent  Acute bronchitis, unspecified organism      NEW MEDICATIONS STARTED DURING THIS VISIT:  ED Discharge Orders        Ordered    potassium chloride (K-DUR) 10 MEQ tablet  2 times daily     03/08/18 1304    predniSONE (DELTASONE) 50 MG tablet     03/08/18 1304    azithromycin (ZITHROMAX Z-PAK) 250 MG tablet     03/08/18 1304    Tiotropium Bromide Monohydrate (SPIRIVA RESPIMAT) 2.5 MCG/ACT AERS  Daily     03/08/18 1304    montelukast (SINGULAIR) 10 MG tablet  Daily     03/08/18 1308          This chart was dictated using voice recognition software/Dragon. Despite best efforts to proofread, errors can occur which can change the meaning. Any change was purely unintentional.    Laban Emperor, PA-C 03/08/18 1739    Darel Hong, MD 03/09/18 2205

## 2018-03-08 NOTE — ED Provider Notes (Signed)
-----------------------------------------   10:46 AM on 03/08/2018 -----------------------------------------  EKG shows normal sinus rhythm at 63 bpm, narrow QRS, normal axis, normal intervals, no concerning ST changes.  Overall reassuring EKG.   Harvest Dark, MD 03/08/18 1047

## 2018-03-12 ENCOUNTER — Ambulatory Visit (INDEPENDENT_AMBULATORY_CARE_PROVIDER_SITE_OTHER): Payer: Medicaid Other | Admitting: Family Medicine

## 2018-03-12 ENCOUNTER — Encounter: Payer: Self-pay | Admitting: Family Medicine

## 2018-03-12 ENCOUNTER — Ambulatory Visit: Payer: Self-pay | Admitting: Family Medicine

## 2018-03-12 ENCOUNTER — Ambulatory Visit: Payer: Self-pay | Admitting: *Deleted

## 2018-03-12 ENCOUNTER — Other Ambulatory Visit
Admission: RE | Admit: 2018-03-12 | Discharge: 2018-03-12 | Disposition: A | Discharge status: Home / Self Care | Payer: Medicaid Other | Source: Ambulatory Visit | Attending: Family Medicine | Admitting: Family Medicine

## 2018-03-12 VITALS — BP 126/74 | HR 98 | Temp 98.0°F | Resp 14 | Ht 66.0 in | Wt 209.8 lb

## 2018-03-12 DIAGNOSIS — E876 Hypokalemia: Secondary | ICD-10-CM | POA: Insufficient documentation

## 2018-03-12 DIAGNOSIS — J4531 Mild persistent asthma with (acute) exacerbation: Secondary | ICD-10-CM | POA: Diagnosis not present

## 2018-03-12 DIAGNOSIS — F4310 Post-traumatic stress disorder, unspecified: Secondary | ICD-10-CM

## 2018-03-12 DIAGNOSIS — E039 Hypothyroidism, unspecified: Secondary | ICD-10-CM | POA: Insufficient documentation

## 2018-03-12 DIAGNOSIS — M2550 Pain in unspecified joint: Secondary | ICD-10-CM

## 2018-03-12 DIAGNOSIS — J4540 Moderate persistent asthma, uncomplicated: Secondary | ICD-10-CM

## 2018-03-12 DIAGNOSIS — F1910 Other psychoactive substance abuse, uncomplicated: Secondary | ICD-10-CM

## 2018-03-12 DIAGNOSIS — J3089 Other allergic rhinitis: Secondary | ICD-10-CM

## 2018-03-12 LAB — TSH: TSH: 3.713 u[IU]/mL (ref 0.350–4.500)

## 2018-03-12 LAB — BASIC METABOLIC PANEL
Anion gap: 8 (ref 5–15)
BUN: 14 mg/dL (ref 6–20)
CO2: 25 mmol/L (ref 22–32)
Calcium: 8.7 mg/dL — ABNORMAL LOW (ref 8.9–10.3)
Chloride: 104 mmol/L (ref 101–111)
Creatinine, Ser: 0.76 mg/dL (ref 0.44–1.00)
GFR calc Af Amer: >60 mL/min (ref >60)
GFR calc non Af Amer: >60 mL/min (ref >60)
Glucose, Bld: 118 mg/dL — ABNORMAL HIGH (ref 65–99)
Potassium: 4.1 mmol/L (ref 3.5–5.1)
Sodium: 137 mmol/L (ref 135–145)

## 2018-03-12 LAB — MAGNESIUM: Magnesium: 2 mg/dL (ref 1.7–2.4)

## 2018-03-12 LAB — C-REACTIVE PROTEIN: CRP: <0.8 mg/dL (ref <1.0)

## 2018-03-12 MED ORDER — PREDNISONE 10 MG PO TABS: ORAL_TABLET | ORAL | 0 refills | Status: DC

## 2018-03-12 MED ORDER — FLUTICASONE PROPIONATE HFA 110 MCG/ACT IN AERO: 2.0000 | INHALATION_SPRAY | Freq: Two times a day (BID) | RESPIRATORY_TRACT | 12 refills | Status: DC

## 2018-03-12 NOTE — Assessment & Plan Note (Signed)
Check TSH 

## 2018-03-12 NOTE — Assessment & Plan Note (Signed)
Seeing psychiatrist; no thoughts of SI or anything else; on medicine

## 2018-03-12 NOTE — Progress Notes (Signed)
BP 126/74   Pulse 98   Temp 98 F (36.7 C) (Oral)   Resp 14   Ht 5\' 6"  (1.676 m)   Wt 209 lb 12.8 oz (95.2 kg)   LMP 10/30/2014   SpO2 97%   BMI 33.86 kg/m    Subjective:    Patient ID: Renee Benjamin, female    DOB: 11/28/1980, 37 y.o.   MRN: [id number]  HPI: Renee Benjamin is a 37 y.o. female  Chief Complaint  Patient presents with  . Leg Pain    with weakness, onset 5 days ago went to ER was dx with low potassium  . Medication Refill    HPI  Leg Pain Patient started this morning, woke up normally and then tried to stand and had significant pain in right anterior lower leg and bilateral knee pain. Patient has never had pain in this location before. States has fibromyalgia but doesn't feel like that pain. Patient endorses right toe numbness constant ongoing for a few months. States is constantly accidentally tripping on things, but no known significant injury or trauma.  Doesn't smoke, no oral contraceptives, no long travels Mother and grandmother both diagnosed with lupus; rash on the lateral arms, present for years; butterfly rash on the cheeks comes and goes; when hot or agitated, few months Hx of low B12 and low K+ Just finished prednisone since 03/01/18; 50 mg just blast blast blast she says; no taper; first Rx was 03/01/18; then had asthma attack at [place] and got too hot and ended up at urgent care; then coughing dark nasty stuff; treated with azithromycin too  Hypothyroidism 77mcg a day has been out of medicine for the last 2 days.  Patient endorses diaphoresis, and fatigue with mild constipation  Denies cold/heat intolerances  Lab Results  Component Value Date   TSH 3.713 03/12/2018   Rash Started three days ago after she was outside at the park. States finished prednisone course today. Rash is mildly painful and itchy- has been using triamcinolone cream with relief. sts rash hasn't worsened  PTSD Now seeing psychiatrist at [facility]; she is  embarrassed to admit that she was using recreational drugs to cope  Depression screen [facility] 2/9 03/12/2018 07/28/2017 02/24/2017 07/24/2016 04/10/2016  Decreased Interest 0 0 0 0 0  Down, Depressed, Hopeless 1 0 0 0 0  PHQ - 2 Score 1 0 0 0 0    Relevant past medical, surgical, family and social history reviewed Past Medical History:  Diagnosis Date  . ADHD (attention deficit hyperactivity disorder) 01/09/2016  . Allergy   . Anemia   . Anxiety   . Anxiety    separation anxiety  . Asthma   . Chronic right hip pain 04/10/2016  . Collagen vascular disease ([place])   . Endometriosis   . Fibromyalgia 01/09/2016  . Headache    otc med prn  . Hypothyroidism   . Plantar fasciitis of left foot 08.29.14   PLANTAR HEEL ,FLUID AROUND MEDIAL BAND  . Polycystic ovarian disease   . Polysubstance abuse ([place]) 01/22/2018   ER visit March 2019: Drug screen is positive for marijuana, cocaine, benzodiazepines, barbiturates, and opiates  . Stress fracture 08.15.14   RIGHT   . Thoracic back pain   . Thyroid disease   . Vitamin D deficiency disease    Past Surgical History:  Procedure Laterality Date  . ABDOMINAL HYSTERECTOMY  March 2016   Due to uterus being stiched into C Section incision  . APPENDECTOMY    .  APPENDECTOMY  2008  . CESAREAN SECTION    . CESAREAN SECTION  2014  . LAPAROSCOPIC BILATERAL SALPINGECTOMY Bilateral 01/02/2015   Procedure: LAPAROSCOPIC BILATERAL SALPINGECTOMY;  Surgeon: Osborne Oman, MD;  Location: [facility];  Service: Gynecology;  Laterality: Bilateral;  . LAPAROSCOPIC HYSTERECTOMY N/A 01/02/2015   Procedure: HYSTERECTOMY TOTAL LAPAROSCOPIC;  Surgeon: Osborne Oman, MD;  Location: [facility];  Service: Gynecology;  Laterality: N/A;  . SHOULDER SURGERY Right   . SHOULDER SURGERY  2001   right   Family History  Problem Relation Age of Onset  . Diabetes Father   . Osteoporosis Mother   . Cancer Maternal Grandmother        breast  . Osteoporosis Maternal Grandmother    Social  History   Tobacco Use  . Smoking status: Former Smoker    Packs/day: 0.25    Years: 4.00    Pack years: 1.00    Types: Cigarettes    Last attempt to quit: 04/17/2012    Years since quitting: 5.9  . Smokeless tobacco: Never Used  Substance Use Topics  . Alcohol use: No    Alcohol/week: 0.0 oz  . Drug use: No    Interim medical history since last visit reviewed. Allergies and medications reviewed  Review of Systems Per HPI unless specifically indicated above     Objective:    BP 126/74   Pulse 98   Temp 98 F (36.7 C) (Oral)   Resp 14   Ht 5\' 6"  (1.676 m)   Wt 209 lb 12.8 oz (95.2 kg)   LMP 10/30/2014   SpO2 97%   BMI 33.86 kg/m   Wt Readings from Last 3 Encounters:  03/16/18 209 lb (94.8 kg)  03/12/18 209 lb 12.8 oz (95.2 kg)  03/08/18 213 lb (96.6 kg)    Physical Exam  Constitutional: She appears well-developed and well-nourished.  HENT:  Mouth/Throat: Mucous membranes are normal.  Eyes: EOM are normal. No scleral icterus.  Cardiovascular: Normal rate and regular rhythm.  Pulmonary/Chest: Effort normal and breath sounds normal.  Skin:     Psychiatric: She has a normal mood and affect. Her behavior is normal. Her speech is not rapid and/or pressured and not slurred.  Good eye contact    Results for orders placed or performed during the hospital encounter of 03/08/18  Brain natriuretic peptide  Result Value Ref Range   B Natriuretic Peptide 30.0 0.0 - 100.0 pg/mL  Comprehensive metabolic panel  Result Value Ref Range   Sodium 139 135 - 145 mmol/L   Potassium 2.8 (L) 3.5 - 5.1 mmol/L   Chloride 101 101 - 111 mmol/L   CO2 31 22 - 32 mmol/L   Glucose, Bld 96 65 - 99 mg/dL   BUN 15 6 - 20 mg/dL   Creatinine, Ser 0.81 0.44 - 1.00 mg/dL   Calcium 8.4 (L) 8.9 - 10.3 mg/dL   Total Protein 6.5 6.5 - 8.1 g/dL   Albumin 3.7 3.5 - 5.0 g/dL   AST 13 (L) 15 - 41 U/L   ALT 11 (L) 14 - 54 U/L   Alkaline Phosphatase 57 38 - 126 U/L   Total Bilirubin 0.5 0.3 -  1.2 mg/dL   GFR calc non Af Amer >60 >60 mL/min   GFR calc Af Amer >60 >60 mL/min   Anion gap 7 5 - 15  CBC  Result Value Ref Range   WBC 14.5 (H) 3.6 - 11.0 K/uL   RBC 4.34 3.80 - 5.20  MIL/uL   Hemoglobin 13.9 12.0 - 16.0 g/dL   HCT 40.3 35.0 - 47.0 %   MCV 92.8 80.0 - 100.0 fL   MCH 32.1 26.0 - 34.0 pg   MCHC 34.6 32.0 - 36.0 g/dL   RDW 12.4 11.5 - 14.5 %   Platelets 326 150 - 440 K/uL  Fibrin derivatives D-Dimer ([facility] only)  Result Value Ref Range   Fibrin derivatives D-dimer ([facility]) 345.09 0.00 - 499.00 ng/mL (FEU)      Assessment & Plan:   Problem List Items Addressed This Visit      Respiratory   Allergic rhinitis   Relevant Medications   fluticasone (FLOVENT HFA) 110 MCG/ACT inhaler     Endocrine   Hypothyroidism - Primary    Check TSH        Other   PTSD (post-traumatic stress disorder)    Seeing psychiatrist; no thoughts of SI or anything else; on medicine      Polysubstance abuse ([place])    Previous drug screen positive for marijuana, cocaine, benzos, barbiturates, and opiates; absolutely no controlled substances will be prescribed by me       Other Visit Diagnoses    Moderate persistent asthma, unspecified whether complicated       Relevant Medications   fluticasone (FLOVENT HFA) 110 MCG/ACT inhaler   predniSONE (DELTASONE) 10 MG tablet   Arthralgia of multiple sites       Hypokalemia       Mild persistent asthma with acute exacerbation       Relevant Medications   fluticasone (FLOVENT HFA) 110 MCG/ACT inhaler   predniSONE (DELTASONE) 10 MG tablet       Follow up plan: No follow-ups on file.  An after-visit summary was printed and given to the patient at [facility].  Please see the patient instructions which may contain other information and recommendations beyond what is mentioned above in the assessment and plan.  Meds ordered this encounter  Medications  . fluticasone (FLOVENT HFA) 110 MCG/ACT inhaler    Sig: Inhale 2 puffs into the lungs 2  (two) times daily. (rinse mouth out after each use); this replaces QVAR    Dispense:  1 Inhaler    Refill:  12    Order Specific Question:   Supervising Provider    Answer:   Evadean Sproule, Satira Anis [[id number]]  . predniSONE (DELTASONE) 10 MG tablet    Sig: Take 4 pills on Sat and Sun, 3 pills on Mon and Tues, 2 pills on Wed and Thurs, 1 pill on Fri and Sat    Dispense:  20 tablet    Refill:  0    No orders of the defined types were placed in this encounter.

## 2018-03-12 NOTE — Patient Instructions (Signed)
Please have labs done at the hospital today, and we'll contact you If you have not heard anything from my staff in a week about any orders/referrals/studies from today, please contact us here to follow-up [phone]

## 2018-03-12 NOTE — Telephone Encounter (Signed)
Patient phones to report she woke up this morning with pain in both legs. The posteriors pain starts just below her kidneys and down both legs. She stated she feels a lot of weakness in legs, also.Reports she has been on prednisone 4/29. She was seen at [facility] emergency room on 5/6 dx with  Hypokalemia-Potassium 2.8, asthma exacerbation, bronchitis. She was prescribed potassium, abx, and duonebs. She completed the potassium, yesterday.  . Also, stated at a previous ER visit she was told she has an enlarged heart. She reports a productive cough this morning. Since waking this am, she has noticed a quick, sharp pain in her chest. Denies any fever, SOB, dizziness and swelling. Her Medicaid card has been updated to show [facility] as PCP but she does not have updated copy until June 1st.   Reason for Disposition . [1] SEVERE pain (e.g., excruciating, unable to do any normal activities) AND [2] not improved after 2 hours of pain medicine  Answer Assessment - Initial Assessment Questions 1. ONSET: "When did the pain start?"   Woke up this morning with bilateral leg pain, anterior and posterior. Lower back, just below the kidneys is where the pain starts and goes down both legs.  2. LOCATION: "Where is the pain located?"   Below the kidneys down both legs, front and back.  3. PAIN: "How bad is the pain?"    (Scale 1-10; or mild, moderate, severe)   -  MILD (1-3): doesn't interfere with normal activities    -  MODERATE (4-7): interferes with normal activities (e.g., work or school) or awakens from sleep, limping    -  SEVERE (8-10): excruciating pain, unable to do any normal activities, unable to walk     8 with trying to walk.  4. WORK OR EXERCISE: "Has there been any recent work or exercise that involved this part of the body?"     none 5. CAUSE: "What do you think is causing the leg pain?"     Does not know. Stated she has been on prednisone prescribed by the ER and UC for 4.5 weeks.  6. OTHER  SYMPTOMS: "Do you have any other symptoms?" (e.g., chest pain, back pain, breathing difficulty, swelling, rash, fever, numbness, weakness)    2 stabbing pains in the area of her heart, that lasted only seconds this morning.  7. PREGNANCY: "Is there any chance you are pregnant?" "When was your last menstrual period?"   no  Protocols used: LEG PAIN-A-AH

## 2018-03-13 LAB — ANA W/REFLEX IF POSITIVE: Anti Nuclear Antibody(ANA): NEGATIVE

## 2018-03-15 ENCOUNTER — Encounter: Payer: Self-pay | Admitting: Family Medicine

## 2018-03-15 MED ORDER — TRIAMCINOLONE ACETONIDE 0.1 % EX CREA: 1.0000 "application " | TOPICAL_CREAM | Freq: Three times a day (TID) | CUTANEOUS | 0 refills | Status: DC

## 2018-03-15 MED ORDER — LEVOTHYROXINE SODIUM 75 MCG PO TABS: 75.0000 ug | ORAL_TABLET | Freq: Every day | ORAL | 5 refills | Status: DC

## 2018-03-16 ENCOUNTER — Other Ambulatory Visit: Payer: Self-pay

## 2018-03-16 ENCOUNTER — Emergency Department
Admission: EM | Admit: 2018-03-16 | Discharge: 2018-03-16 | Disposition: A | Discharge status: Home / Self Care | Payer: Medicaid Other | Attending: Emergency Medicine | Admitting: Emergency Medicine

## 2018-03-16 ENCOUNTER — Encounter: Payer: Self-pay | Admitting: Emergency Medicine

## 2018-03-16 DIAGNOSIS — Z5321 Procedure and treatment not carried out due to patient leaving prior to being seen by health care provider: Secondary | ICD-10-CM | POA: Insufficient documentation

## 2018-03-16 DIAGNOSIS — M2550 Pain in unspecified joint: Secondary | ICD-10-CM | POA: Diagnosis present

## 2018-03-16 NOTE — ED Triage Notes (Signed)
Pt to triage via w/c with no distress noted; st she is currently being tapered off prednisone and thinks she is having withdrawal; st awakening with joint pain

## 2018-03-19 ENCOUNTER — Telehealth: Payer: Self-pay

## 2018-03-19 ENCOUNTER — Encounter: Payer: Self-pay | Admitting: Family Medicine

## 2018-03-19 NOTE — Telephone Encounter (Signed)
Copied from [place] [id number]. Topic: Inquiry >> Mar 19, 2018 12:48 PM Oliver Pila B wrote: Reason for CRM: pt called b/c she came in last week about a rash that has not got better, when pt is hot the rash is immensely radiating pain, call pt b/c no slots are available and she is seeing if maybe another medication could be called in

## 2018-03-19 NOTE — Telephone Encounter (Signed)
No, I'll continue the triamcinolone cream and hydroxyzine and encourage patient to stay cool so she doesn't overheat

## 2018-03-29 NOTE — Assessment & Plan Note (Signed)
Previous drug screen positive for marijuana, cocaine, benzos, barbiturates, and opiates; absolutely no controlled substances will be prescribed by me

## 2018-03-31 ENCOUNTER — Encounter: Payer: Self-pay | Admitting: Family Medicine

## 2018-03-31 ENCOUNTER — Ambulatory Visit: Payer: Medicaid Other | Admitting: Family Medicine

## 2018-03-31 VITALS — BP 118/64 | HR 98 | Temp 98.3°F | Resp 14 | Ht 66.0 in | Wt 215.2 lb

## 2018-03-31 DIAGNOSIS — Z111 Encounter for screening for respiratory tuberculosis: Secondary | ICD-10-CM | POA: Diagnosis not present

## 2018-03-31 DIAGNOSIS — F4310 Post-traumatic stress disorder, unspecified: Secondary | ICD-10-CM | POA: Diagnosis not present

## 2018-03-31 DIAGNOSIS — J301 Allergic rhinitis due to pollen: Secondary | ICD-10-CM | POA: Diagnosis not present

## 2018-03-31 DIAGNOSIS — F331 Major depressive disorder, recurrent, moderate: Secondary | ICD-10-CM | POA: Diagnosis not present

## 2018-03-31 DIAGNOSIS — Z0289 Encounter for other administrative examinations: Secondary | ICD-10-CM

## 2018-03-31 NOTE — Assessment & Plan Note (Signed)
Taking medications;s table

## 2018-03-31 NOTE — Assessment & Plan Note (Signed)
Under the care of psychiatrist  

## 2018-03-31 NOTE — Progress Notes (Signed)
BP 118/64   Pulse 98   Temp 98.3 F (36.8 C) (Oral)   Resp 14   Ht 5\' 6"  (1.676 m)   Wt 215 lb 3.2 oz (97.6 kg)   LMP 10/30/2014   SpO2 92%   BMI 34.73 kg/m    Subjective:    Patient ID: Renee Benjamin, female    DOB: 1980/12/20, 37 y.o.   MRN: [id number]  HPI: AMYRI Benjamin is a 37 y.o. female  Chief Complaint  Patient presents with  . 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  She is going to [facility]; treating her for bipolar mania and PTSD; self-treating with recreational substances earlier; trying to move away from her step-mother  She went to urgent care; hurt her left shoulder; went to urgent care 3 weeks ago and got vicodin; caution given immediately  Allergic rhinitis; on allegra and singulair; helping to control symptoms; also using flonase  She had pneumonia and was given another inhaler at urgent care; spiriva; "that stuff is nasty"; all done with that  Depression screen [facility] 2/9 03/12/2018 07/28/2017 02/24/2017 07/24/2016 04/10/2016  Decreased Interest 0 0 0 0 0  Down, Depressed, Hopeless 1 0 0 0 0  PHQ - 2 Score 1 0 0 0 0    Relevant past medical, surgical, family and social history reviewed Past Medical History:  Diagnosis Date  . ADHD (attention deficit hyperactivity disorder) 01/09/2016  . Allergy   . Anemia   . Anxiety   . Anxiety    separation anxiety  . Asthma   . Chronic right hip pain 04/10/2016  . Collagen vascular disease ([place])   . Endometriosis   . Fibromyalgia 01/09/2016  . Headache    otc med prn  . Hypothyroidism   . Plantar fasciitis of left foot 08.29.14   PLANTAR HEEL ,FLUID AROUND MEDIAL BAND  . Polycystic ovarian disease   . Polysubstance abuse ([place]) 01/22/2018   ER visit March 2019:  Drug screen is positive for marijuana, cocaine, benzodiazepines, barbiturates, and opiates  . Stress fracture 08.15.14   RIGHT   . Thoracic back pain   . Thyroid disease   . Vitamin D deficiency disease    Past Surgical History:  Procedure Laterality Date  . ABDOMINAL HYSTERECTOMY  March 2016   Due to uterus being stiched into C Section incision  . APPENDECTOMY    . APPENDECTOMY  2008  . CESAREAN SECTION    . CESAREAN SECTION  2014  . LAPAROSCOPIC BILATERAL SALPINGECTOMY Bilateral 01/02/2015   Procedure: LAPAROSCOPIC BILATERAL SALPINGECTOMY;  Surgeon: Osborne Oman, MD;  Location: [facility];  Service: Gynecology;  Laterality: Bilateral;  . LAPAROSCOPIC HYSTERECTOMY N/A 01/02/2015   Procedure: HYSTERECTOMY TOTAL LAPAROSCOPIC;  Surgeon: Osborne Oman, MD;  Location: [facility];  Service: Gynecology;  Laterality: N/A;  . SHOULDER SURGERY Right   . SHOULDER SURGERY  2001   right   Family History  Problem Relation Age of Onset  . Diabetes Father   . Osteoporosis Mother   . Cancer Maternal Grandmother        breast  . Osteoporosis Maternal Grandmother    Social History   Tobacco Use  . Smoking status: Former Smoker  Packs/day: 0.25    Years: 4.00    Pack years: 1.00    Types: Cigarettes    Last attempt to quit: 04/17/2012    Years since quitting: 5.9  . Smokeless tobacco: Never Used  Substance Use Topics  . Alcohol use: No    Alcohol/week: 0.0 oz  . Drug use: No    Interim medical history since last visit reviewed. Allergies and medications reviewed  Review of Systems Per HPI unless specifically indicated above     Objective:    BP 118/64   Pulse 98   Temp 98.3 F (36.8 C) (Oral)   Resp 14   Ht 5\' 6"  (1.676 m)   Wt 215 lb 3.2 oz (97.6 kg)   LMP 10/30/2014   SpO2 92%   BMI 34.73 kg/m   Wt Readings from Last 3 Encounters:  03/31/18 215 lb 3.2 oz (97.6 kg)  03/16/18 209 lb (94.8 kg)  03/12/18 209 lb 12.8 oz (95.2 kg)    Physical Exam  Constitutional: She  appears well-developed and well-nourished.  HENT:  Mouth/Throat: Mucous membranes are normal.  Eyes: EOM are normal. No scleral icterus.  Cardiovascular: Normal rate and regular rhythm.  Pulmonary/Chest: Effort normal and breath sounds normal.  Psychiatric: She has a normal mood and affect. Her behavior is normal.    Results for orders placed or performed during the hospital encounter of 03/12/18  C-reactive protein  Result Value Ref Range   CRP <0.8 <1.0 mg/dL  Basic metabolic panel  Result Value Ref Range   Sodium 137 135 - 145 mmol/L   Potassium 4.1 3.5 - 5.1 mmol/L   Chloride 104 101 - 111 mmol/L   CO2 25 22 - 32 mmol/L   Glucose, Bld 118 (H) 65 - 99 mg/dL   BUN 14 6 - 20 mg/dL   Creatinine, Ser 0.76 0.44 - 1.00 mg/dL   Calcium 8.7 (L) 8.9 - 10.3 mg/dL   GFR calc non Af Amer >60 >60 mL/min   GFR calc Af Amer >60 >60 mL/min   Anion gap 8 5 - 15  Magnesium  Result Value Ref Range   Magnesium 2.0 1.7 - 2.4 mg/dL  ANA w/Reflex if Positive  Result Value Ref Range   Anit Nuclear Antibody(ANA) Negative Negative  TSH  Result Value Ref Range   TSH 3.713 0.350 - 4.500 uIU/mL      Assessment & Plan:   Problem List Items Addressed This Visit      Respiratory   Allergic rhinitis - Primary    Taking medications;s table        Other   PTSD (post-traumatic stress disorder)    Under the care of psychiatrist      Moderate recurrent major depression ([place])    Under the care of psychiatrist       Other Visit Diagnoses    Encounter for completion of form with patient       form completed for job; I do not believe any of her current diagnoses 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  An after-visit summary was printed and given to the patient at [place].  Please see the patient instructions which  may contain other information and recommendations beyond what is mentioned above in the assessment and plan.  No orders of the defined types were placed in this encounter.   Orders Placed This Encounter  Procedures  . QuantiFERON-TB Gold Plus

## 2018-03-31 NOTE — Patient Instructions (Signed)
We'll get the labs for you We'll finish the form when ready Good luck with everything

## 2018-04-02 LAB — QUANTIFERON-TB GOLD PLUS
Mitogen-NIL: >10 IU/mL
NIL: 0.02 IU/mL
QuantiFERON-TB Gold Plus: NEGATIVE
TB1-NIL: 0.01 IU/mL
TB2-NIL: 0.01 IU/mL

## 2018-05-17 ENCOUNTER — Ambulatory Visit: Payer: Medicaid Other | Admitting: Family Medicine

## 2018-05-17 ENCOUNTER — Encounter: Payer: Self-pay | Admitting: Family Medicine

## 2018-05-17 VITALS — BP 124/80 | HR 59 | Temp 99.0°F | Resp 16 | Ht 66.0 in | Wt 229.9 lb

## 2018-05-17 DIAGNOSIS — F1910 Other psychoactive substance abuse, uncomplicated: Secondary | ICD-10-CM

## 2018-05-17 DIAGNOSIS — F909 Attention-deficit hyperactivity disorder, unspecified type: Secondary | ICD-10-CM | POA: Diagnosis not present

## 2018-05-17 NOTE — Progress Notes (Signed)
Name: Renee Benjamin   MRN: [id number]    DOB: 1981/06/16   Date:05/17/2018       Progress Note  Subjective  Chief Complaint  Chief Complaint  Patient presents with  . ADHD    med refill    HPI  Pt presents to request Adderral refill for her ADHD.  She took this in the past when she was in school - stopped about 3 years ago. Last fill was 01/28/2016 for 10mg  BID. She is returning to school now - at [employer] for childcare degree.  She was at a seminar the other day and was really struggling to pay attention and sit still in class, and she is concerned that she will not be successful without having her medication again.  Endorses hyperactivity, complete lack of focus.  She does have a history of polysubstance abuse which she states was to help to self medicate her PTSD; she is feeling better on her psych medications - [facility] - however she is frustrated that she has gained 15lbs since last visit.  Patient Active Problem List   Diagnosis Date Noted  . Polysubstance abuse ([place]) 01/22/2018  . Moderate recurrent major depression ([place]) 01/21/2018  . Cocaine abuse ([place]) 01/21/2018  . Cannabis abuse 01/21/2018  . Benzodiazepine abuse ([place]) 01/21/2018  . Barbiturate abuse ([place]) 01/21/2018  . Opiate abuse, episodic ([place]) 01/21/2018  . Allergic rhinitis 02/24/2017  . Vitamin B12 deficiency 02/24/2017  . Chronic right hip pain 04/10/2016  . Controlled substance agreement signed 02/01/2016  . Obesity 02/01/2016  . ADHD (attention deficit hyperactivity disorder) 01/09/2016  . Fibromyalgia 01/09/2016  . Torticollis, acute 10/08/2015  . Headache 10/02/2015  . Thoracic back pain 10/02/2015  . Lumbar pain 10/02/2015  . Arthralgia 10/02/2015  . Chronic tonsillitis 08/20/2015  . Migraine headache with aura 04/10/2015  . Hypothyroidism 04/10/2015  . PTSD (post-traumatic stress disorder) 04/10/2015  . Status post laparoscopic hysterectomy 01/03/2015  . Chronic female pelvic  pain 11/23/2014  . Plantar fasciitis of left foot   . Stress fracture     Social History   Tobacco Use  . Smoking status: Former Smoker    Packs/day: 0.25    Years: 4.00    Pack years: 1.00    Types: Cigarettes    Last attempt to quit: 04/17/2012    Years since quitting: 6.0  . Smokeless tobacco: Never Used  Substance Use Topics  . Alcohol use: No    Alcohol/week: 0.0 oz     Current Outpatient Medications:  .  ARIPiprazole (ABILIFY) 5 MG tablet, Take 5 mg by mouth at bedtime., Disp: , Rfl: 0 .  fexofenadine (ALLEGRA) 180 MG tablet, Take 180 mg by mouth daily., Disp: , Rfl:  .  fluticasone (FLOVENT HFA) 110 MCG/ACT inhaler, Inhale 2 puffs into the lungs 2 (two) times daily. (rinse mouth out after each use); this replaces QVAR, Disp: 1 Inhaler, Rfl: 12 .  levothyroxine (SYNTHROID, LEVOTHROID) 75 MCG tablet, Take 1 tablet (75 mcg total) by mouth daily before breakfast., Disp: 30 tablet, Rfl: 5 .  meclizine (ANTIVERT) 25 MG tablet, Take 1 tablet (25 mg total) by mouth 3 (three) times daily as needed for dizziness., Disp: , Rfl: 0 .  montelukast (SINGULAIR) 10 MG tablet, Take 1 tablet (10 mg total) by mouth daily., Disp: 30 tablet, Rfl: 0 .  PROAIR HFA 108 (90 Base) MCG/ACT inhaler, INHALE 2 PUFFS BY MOUTH EVERY 6 HOURS IFNEEDED FOR WHEEZING OR SHORTNESS OF BREATH., Disp: 8.5  g, Rfl: 0 .  QUEtiapine (SEROQUEL) 25 MG tablet, Take 25 mg by mouth at bedtime as needed., Disp: , Rfl: 0 .  Tiotropium Bromide Monohydrate (SPIRIVA RESPIMAT) 2.5 MCG/ACT AERS, Inhale 2.5 mcg into the lungs daily., Disp: 4 g, Rfl: 0 .  Cyanocobalamin (RA VITAMIN B-12 TR) 1000 MCG TBCR, Take 1,000 mcg by mouth daily., Disp: , Rfl:  .  potassium chloride (K-DUR) 10 MEQ tablet, Take 1 tablet (10 mEq total) by mouth 2 (two) times daily for 5 days., Disp: 10 tablet, Rfl: 0 .  triamcinolone cream (KENALOG) 0.1 %, Apply 1 application topically 3 (three) times daily. If needed; too strong for face, groin, underarms (Patient  not taking: Reported on 03/31/2018), Disp: 45 g, Rfl: 0  Allergies  Allergen Reactions  . Depakote [Divalproex Sodium] Hives  . Morphine Hives  . Propoxyphene Other (See Comments)  . Tramadol Nausea Only  . Tramadol Nausea And Vomiting and Other (See Comments)  . Darvocet [Propoxyphene N-Acetaminophen] Rash  . Valproic Acid Rash    ROS  Ten systems reviewed and is negative except as mentioned in HPI  Objective  Vitals:   05/17/18 1306  BP: 124/80  Pulse: (!) 59  Resp: 16  Temp: 99 F (37.2 C)  TempSrc: Oral  SpO2: 93%  Weight: 229 lb 14.4 oz (104.3 kg)  Height: 5\' 6"  (1.676 m)   Body mass index is 37.11 kg/m.  Nursing Note and Vital Signs reviewed.  Physical Exam  Constitutional: Patient appears well-developed and well-nourished. Obese. No distress.  HEENT: head atraumatic, normocephalic Cardiovascular: Normal rate, regular rhythm, S1/S2 present.  No murmur or rub heard. No BLE edema. Pulmonary/Chest: Effort normal and breath sounds clear. No respiratory distress or retractions. Psychiatric: Patient has a normal mood and affect. behavior is appropriate. Judgment and thought content appropriate.  No results found for this or any previous visit (from the past 72 hour(s)).  Assessment & Plan  1. Attention deficit hyperactivity disorder (ADHD), unspecified ADHD type - Ambulatory referral to Psychiatry  2. Polysubstance abuse ([place]) - Ambulatory referral to Psychiatry

## 2018-06-02 ENCOUNTER — Other Ambulatory Visit: Payer: Self-pay

## 2018-06-02 ENCOUNTER — Emergency Department
Admission: EM | Admit: 2018-06-02 | Discharge: 2018-06-02 | Disposition: A | Discharge status: Home / Self Care | Payer: Medicaid Other | Attending: Emergency Medicine | Admitting: Emergency Medicine

## 2018-06-02 ENCOUNTER — Emergency Department: Payer: Medicaid Other

## 2018-06-02 DIAGNOSIS — S93505A Unspecified sprain of left lesser toe(s), initial encounter: Secondary | ICD-10-CM | POA: Diagnosis not present

## 2018-06-02 DIAGNOSIS — Y9301 Activity, walking, marching and hiking: Secondary | ICD-10-CM | POA: Diagnosis not present

## 2018-06-02 DIAGNOSIS — E039 Hypothyroidism, unspecified: Secondary | ICD-10-CM | POA: Insufficient documentation

## 2018-06-02 DIAGNOSIS — W228XXA Striking against or struck by other objects, initial encounter: Secondary | ICD-10-CM | POA: Diagnosis not present

## 2018-06-02 DIAGNOSIS — Z87891 Personal history of nicotine dependence: Secondary | ICD-10-CM | POA: Diagnosis not present

## 2018-06-02 DIAGNOSIS — S93509A Unspecified sprain of unspecified toe(s), initial encounter: Secondary | ICD-10-CM

## 2018-06-02 DIAGNOSIS — Z79899 Other long term (current) drug therapy: Secondary | ICD-10-CM | POA: Insufficient documentation

## 2018-06-02 DIAGNOSIS — Y998 Other external cause status: Secondary | ICD-10-CM | POA: Insufficient documentation

## 2018-06-02 DIAGNOSIS — S99922A Unspecified injury of left foot, initial encounter: Secondary | ICD-10-CM | POA: Diagnosis present

## 2018-06-02 DIAGNOSIS — J45909 Unspecified asthma, uncomplicated: Secondary | ICD-10-CM | POA: Insufficient documentation

## 2018-06-02 DIAGNOSIS — Y92013 Bedroom of single-family (private) house as the place of occurrence of the external cause: Secondary | ICD-10-CM | POA: Insufficient documentation

## 2018-06-02 MED ORDER — IBUPROFEN 600 MG PO TABS: 600.0000 mg | ORAL_TABLET | Freq: Four times a day (QID) | ORAL | 0 refills | Status: DC | PRN

## 2018-06-02 NOTE — ED Provider Notes (Signed)
[facility] Emergency Department Provider Note  ____________________________________________  Time seen: Approximately 1:21 PM  I have reviewed the triage vital signs and the nursing notes.   HISTORY  Chief Complaint Toe Injury    HPI Renee Benjamin is a 37 y.o. female that presents to the emergency department for evaluation of left toe injury.  Patient states that she stubbed her toe on a dresser this morning and it bent backwards.  The bruising has been worsening since.  She tried to going to work wearing open toed shoes and was told that she needs to be evaluated and cannot work open toed shoes. No blood thinners.    Past Medical History:  Diagnosis Date  . ADHD (attention deficit hyperactivity disorder) 01/09/2016  . Allergy   . Anemia   . Anxiety   . Anxiety    separation anxiety  . Asthma   . Chronic right hip pain 04/10/2016  . Collagen vascular disease ([place])   . Endometriosis   . Fibromyalgia 01/09/2016  . Headache    otc med prn  . Hypothyroidism   . Plantar fasciitis of left foot 08.29.14   PLANTAR HEEL ,FLUID AROUND MEDIAL BAND  . Polycystic ovarian disease   . Polysubstance abuse ([place]) 01/22/2018   ER visit March 2019: Drug screen is positive for marijuana, cocaine, benzodiazepines, barbiturates, and opiates  . Stress fracture 08.15.14   RIGHT   . Thoracic back pain   . Thyroid disease   . Vitamin D deficiency disease     Patient Active Problem List   Diagnosis Date Noted  . Polysubstance abuse ([place]) 01/22/2018  . Moderate recurrent major depression ([place]) 01/21/2018  . Cocaine abuse ([place]) 01/21/2018  . Cannabis abuse 01/21/2018  . Benzodiazepine abuse ([place]) 01/21/2018  . Barbiturate abuse ([place]) 01/21/2018  . Opiate abuse, episodic ([place]) 01/21/2018  . Allergic rhinitis 02/24/2017  . Vitamin B12 deficiency 02/24/2017  . Chronic right hip pain 04/10/2016  . Controlled substance agreement signed 02/01/2016  . Obesity 02/01/2016   . ADHD (attention deficit hyperactivity disorder) 01/09/2016  . Fibromyalgia 01/09/2016  . Torticollis, acute 10/08/2015  . Headache 10/02/2015  . Thoracic back pain 10/02/2015  . Lumbar pain 10/02/2015  . Arthralgia 10/02/2015  . Chronic tonsillitis 08/20/2015  . Migraine headache with aura 04/10/2015  . Hypothyroidism 04/10/2015  . PTSD (post-traumatic stress disorder) 04/10/2015  . Status post laparoscopic hysterectomy 01/03/2015  . Chronic female pelvic pain 11/23/2014  . Plantar fasciitis of left foot   . Stress fracture     Past Surgical History:  Procedure Laterality Date  . ABDOMINAL HYSTERECTOMY  March 2016   Due to uterus being stiched into C Section incision  . APPENDECTOMY    . APPENDECTOMY  2008  . CESAREAN SECTION    . CESAREAN SECTION  2014  . LAPAROSCOPIC BILATERAL SALPINGECTOMY Bilateral 01/02/2015   Procedure: LAPAROSCOPIC BILATERAL SALPINGECTOMY;  Surgeon: Osborne Oman, MD;  Location: [facility];  Service: Gynecology;  Laterality: Bilateral;  . LAPAROSCOPIC HYSTERECTOMY N/A 01/02/2015   Procedure: HYSTERECTOMY TOTAL LAPAROSCOPIC;  Surgeon: Osborne Oman, MD;  Location: [facility];  Service: Gynecology;  Laterality: N/A;  . SHOULDER SURGERY Right   . SHOULDER SURGERY  2001   right    Prior to Admission medications   Medication Sig Start Date End Date Taking? Authorizing Provider  ARIPiprazole (ABILIFY) 5 MG tablet Take 5 mg by mouth at bedtime. 03/09/18   [provider]  Cyanocobalamin (RA VITAMIN B-12 TR) 1000 MCG  TBCR Take 1,000 mcg by mouth daily. 12/06/15   [provider]  fexofenadine (ALLEGRA) 180 MG tablet Take 180 mg by mouth daily.    [provider]  fluticasone (FLOVENT HFA) 110 MCG/ACT inhaler Inhale 2 puffs into the lungs 2 (two) times daily. (rinse mouth out after each use); this replaces QVAR 03/12/18   Lada, Satira Anis, MD  ibuprofen (ADVIL,MOTRIN) 600 MG tablet Take 1 tablet (600 mg total) by mouth every 6 (six) hours as  needed. 06/02/18   Laban Emperor, PA-C  levothyroxine (SYNTHROID, LEVOTHROID) 75 MCG tablet Take 1 tablet (75 mcg total) by mouth daily before breakfast. 03/15/18   Lada, Satira Anis, MD  meclizine (ANTIVERT) 25 MG tablet Take 1 tablet (25 mg total) by mouth 3 (three) times daily as needed for dizziness. 08/06/16   Lada, Satira Anis, MD  montelukast (SINGULAIR) 10 MG tablet Take 1 tablet (10 mg total) by mouth daily. 03/08/18   Laban Emperor, PA-C  potassium chloride (K-DUR) 10 MEQ tablet Take 1 tablet (10 mEq total) by mouth 2 (two) times daily for 5 days. 03/08/18 03/13/18  Laban Emperor, PA-C  PROAIR HFA 108 (90 Base) MCG/ACT inhaler INHALE 2 PUFFS BY MOUTH EVERY 6 HOURS IFNEEDED FOR WHEEZING OR SHORTNESS OF BREATH. 02/23/18   Poulose, Bethel Born, NP  QUEtiapine (SEROQUEL) 25 MG tablet Take 25 mg by mouth at bedtime as needed. 03/09/18   [provider]  Tiotropium Bromide Monohydrate (SPIRIVA RESPIMAT) 2.5 MCG/ACT AERS Inhale 2.5 mcg into the lungs daily. 03/08/18   Laban Emperor, PA-C  triamcinolone cream (KENALOG) 0.1 % Apply 1 application topically 3 (three) times daily. If needed; too strong for face, groin, underarms Patient not taking: Reported on 03/31/2018 03/15/18   Arnetha Courser, MD    Allergies Depakote [divalproex sodium]; Morphine; Propoxyphene; Tramadol; Tramadol; Darvocet [propoxyphene n-acetaminophen]; and Valproic acid  Family History  Problem Relation Age of Onset  . Diabetes Father   . Osteoporosis Mother   . Cancer Maternal Grandmother        breast  . Osteoporosis Maternal Grandmother     Social History Social History   Tobacco Use  . Smoking status: Former Smoker    Packs/day: 0.25    Years: 4.00    Pack years: 1.00    Types: Cigarettes    Last attempt to quit: 04/17/2012    Years since quitting: 6.1  . Smokeless tobacco: Never Used  Substance Use Topics  . Alcohol use: No    Alcohol/week: 0.0 oz  . Drug use: No     Review of Systems   Gastrointestinal: No nausea, no vomiting.  Musculoskeletal: Positive for toe pain. Skin: Negative for rash, abrasions, lacerations.  Positive for ecchymosis.  Neurological: Negative for numbness or tingling   ____________________________________________   PHYSICAL EXAM:  VITAL SIGNS: ED Triage Vitals  Enc Vitals Group     BP 06/02/18 1210 117/72     Pulse Rate 06/02/18 1210 93     Resp 06/02/18 1210 16     Temp 06/02/18 1210 98.7 F (37.1 C)     Temp Source 06/02/18 1210 Oral     SpO2 06/02/18 1210 98 %     Weight 06/02/18 1209 220 lb (99.8 kg)     Height 06/02/18 1209 5\' 8"  (1.727 m)     Head Circumference --      Peak Flow --      Pain Score 06/02/18 1209 6     Pain Loc --  Pain Edu? --      Excl. in [place]? --      Constitutional: Alert and oriented. Well appearing and in no acute distress. Eyes: Conjunctivae are normal. PERRL. EOMI. Head: Atraumatic. ENT:      Ears:      Nose: No congestion/rhinnorhea.      Mouth/Throat: Mucous membranes are moist.  Neck: No stridor. Cardiovascular: Normal rate, regular rhythm.  Good peripheral circulation. Respiratory: Normal respiratory effort without tachypnea or retractions. Lungs CTAB. Good air entry to the bases with no decreased or absent breath sounds. Gastrointestinal: Bowel sounds 4 quadrants. Soft and nontender to palpation. No guarding or rigidity. No palpable masses. No distention.  Musculoskeletal: Full range of motion to all extremities. No gross deformities appreciated. Neurologic:  Normal speech and language. No gross focal neurologic deficits are appreciated.  Skin:  Skin is warm, dry.  Bruising to fourth metacarpal. Psychiatric: Mood and affect are normal. Speech and behavior are normal. Patient exhibits appropriate insight and judgement.   ____________________________________________   LABS (all labs ordered are listed, but only abnormal results are displayed)  Labs Reviewed - No data to  display ____________________________________________  EKG   ____________________________________________  RADIOLOGY Robinette Haines, personally viewed and evaluated these images (plain radiographs) as part of my medical decision making, as well as reviewing the written report by the radiologist.   Dg Foot Complete Left  Result Date: 06/02/2018 CLINICAL DATA:  Kicking injury with pain of the fourth toe. EXAM: LEFT FOOT - COMPLETE 3+ VIEW COMPARISON:  04/17/2015 FINDINGS: No acute or traumatic finding. No sign of fourth toe fracture. Chronic flattening of the second metatarsal head. IMPRESSION: No acute finding.  Specifically, no evidence of fourth toe fracture. Electronically Signed   By: Nelson Chimes M.D.   On: 06/02/2018 12:40    ____________________________________________    PROCEDURES  Procedure(s) performed:    Procedures    Medications - No data to display   ____________________________________________   INITIAL IMPRESSION / ASSESSMENT AND PLAN / ED COURSE  Pertinent labs & imaging results that were available during my care of the patient were reviewed by me and considered in my medical decision making (see chart for details).  Review of the [place] CSRS was performed in accordance of the [place] prior to dispensing any controlled drugs.     Patient's diagnosis is consistent with toe injury.  Vital signs and exam are reassuring.  X-ray negative for acute bony abnormalities.  Postop she was given.  Patient will be discharged home with prescriptions for ibuprofen. Patient is to follow up with PCP as directed. Patient is given ED precautions to return to the ED for any worsening or new symptoms.     ____________________________________________  FINAL CLINICAL IMPRESSION(S) / ED DIAGNOSES  Final diagnoses:  Sprain of toe, initial encounter      NEW MEDICATIONS STARTED DURING THIS VISIT:  ED Discharge Orders        Ordered    ibuprofen (ADVIL,MOTRIN) 600 MG  tablet  Every 6 hours PRN     06/02/18 1344          This chart was dictated using voice recognition software/Dragon. Despite best efforts to proofread, errors can occur which can change the meaning. Any change was purely unintentional.    Laban Emperor, PA-C 06/02/18 1554    Nena Polio, MD 06/03/18 2000

## 2018-06-02 NOTE — ED Triage Notes (Signed)
Pt arrives to ED with c/o L 4th foot pain. Toe is bruised. [employer] worker, community feed. Ambulatory. Alert, oriented. Happened this AM.

## 2018-06-21 ENCOUNTER — Ambulatory Visit: Payer: Medicaid Other | Admitting: Family Medicine

## 2018-06-21 ENCOUNTER — Encounter: Payer: Self-pay | Admitting: Family Medicine

## 2018-06-21 VITALS — BP 122/78 | HR 79 | Temp 98.0°F | Resp 14 | Ht 68.0 in | Wt 216.8 lb

## 2018-06-21 DIAGNOSIS — J45901 Unspecified asthma with (acute) exacerbation: Secondary | ICD-10-CM | POA: Diagnosis not present

## 2018-06-21 DIAGNOSIS — F1410 Cocaine abuse, uncomplicated: Secondary | ICD-10-CM

## 2018-06-21 DIAGNOSIS — J301 Allergic rhinitis due to pollen: Secondary | ICD-10-CM | POA: Diagnosis not present

## 2018-06-21 MED ORDER — BUDESONIDE-FORMOTEROL FUMARATE 80-4.5 MCG/ACT IN AERO: 2.0000 | INHALATION_SPRAY | Freq: Two times a day (BID) | RESPIRATORY_TRACT | 3 refills | Status: DC

## 2018-06-21 MED ORDER — PREDNISONE 20 MG PO TABS: 40.0000 mg | ORAL_TABLET | Freq: Every day | ORAL | 0 refills | Status: DC

## 2018-06-21 MED ORDER — BENZONATATE 100 MG PO CAPS: 100.0000 mg | ORAL_CAPSULE | Freq: Three times a day (TID) | ORAL | 0 refills | Status: DC | PRN

## 2018-06-21 MED ORDER — MONTELUKAST SODIUM 10 MG PO TABS: 10.0000 mg | ORAL_TABLET | Freq: Every day | ORAL | 0 refills | Status: DC

## 2018-06-21 NOTE — Patient Instructions (Addendum)
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

## 2018-06-21 NOTE — Assessment & Plan Note (Signed)
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

## 2018-06-21 NOTE — Progress Notes (Signed)
BP 122/78   Pulse 79   Temp 98 F (36.7 C) (Oral)   Resp 14   Ht 5\' 8"  (1.727 m)   Wt 216 lb 12.8 oz (98.3 kg)   LMP 10/30/2014   SpO2 96%   BMI 32.96 kg/m    Subjective:    Patient ID: Renee Benjamin, female    DOB: 12-Jun-1981, 37 y.o.   MRN: [id number]  HPI: Renee Benjamin is a 37 y.o. female  Chief Complaint  Patient presents with  . 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 Not coughing well, not sleeping well b/c of cough The cough is dry, nothing is coming up, just a little bit of clear stuff Feels really tight upper chest No sore throat; just coughing for a little bit No travel Still taking singulair; just ran out of refills but still taking it She would like to switch to Symbicort Obesity; losing weight on purpose, decreasing portions and drinking more water; put 20 pounds over 3 months earlier with medicine, so now working to get that off  Depression screen [facility] 2/9 06/21/2018 05/17/2018 03/12/2018 07/28/2017 02/24/2017  Decreased Interest 0 0 0 0 0  Down, Depressed, Hopeless 1 0 1 0 0  PHQ - 2 Score 1 0 1 0 0  Altered sleeping 0 0 - - -  Tired, decreased energy 1 0 - - -  Change in appetite 3 1 - - -  Feeling bad or failure about yourself  0 0 - - -  Trouble concentrating 0 0 - - -  Moving slowly or fidgety/restless 0 0 - - -  Suicidal thoughts 0 0 - - -  PHQ-9 Score 5 - - - -  Difficult doing work/chores Somewhat difficult Not difficult at all - - -    Relevant past medical, surgical, family and social history reviewed Past Medical History:  Diagnosis Date  . ADHD (attention deficit hyperactivity disorder) 01/09/2016  . Allergy   . Anemia   . Anxiety   . Anxiety    separation anxiety  . Asthma   . Chronic right hip pain 04/10/2016  . Collagen  vascular disease ([place])   . Endometriosis   . Fibromyalgia 01/09/2016  . Headache    otc med prn  . Hypothyroidism   . Plantar fasciitis of left foot 08.29.14   PLANTAR HEEL ,FLUID AROUND MEDIAL BAND  . Polycystic ovarian disease   . Polysubstance abuse ([place]) 01/22/2018   ER visit March 2019: Drug screen is positive for marijuana, cocaine, benzodiazepines, barbiturates, and opiates  . Stress fracture 08.15.14   RIGHT   . Thoracic back pain   . Thyroid disease   . Vitamin D deficiency disease    Past Surgical History:  Procedure Laterality Date  . ABDOMINAL HYSTERECTOMY  March 2016   Due to uterus being stiched into C Section incision  . APPENDECTOMY    . APPENDECTOMY  2008  . CESAREAN SECTION    . CESAREAN SECTION  2014  . LAPAROSCOPIC BILATERAL SALPINGECTOMY Bilateral 01/02/2015   Procedure: LAPAROSCOPIC BILATERAL SALPINGECTOMY;  Surgeon: Osborne Oman, MD;  Location: [facility];  Service: Gynecology;  Laterality: Bilateral;  . LAPAROSCOPIC HYSTERECTOMY N/A 01/02/2015   Procedure: HYSTERECTOMY TOTAL LAPAROSCOPIC;  Surgeon: Laray Anger  A Anyanwu, MD;  Location: [facility];  Service: Gynecology;  Laterality: N/A;  . SHOULDER SURGERY Right   . SHOULDER SURGERY  2001   right   Family History  Problem Relation Age of Onset  . Diabetes Father   . Osteoporosis Mother   . Cancer Maternal Grandmother        breast  . Osteoporosis Maternal Grandmother    Social History   Tobacco Use  . Smoking status: Former Smoker    Packs/day: 0.25    Years: 4.00    Pack years: 1.00    Types: Cigarettes    Last attempt to quit: 04/17/2012    Years since quitting: 6.1  . Smokeless tobacco: Never Used  Substance Use Topics  . Alcohol use: No    Alcohol/week: 0.0 standard drinks  . Drug use: No    Interim medical history since last visit reviewed. Allergies and medications reviewed  Review of Systems Per HPI unless specifically indicated above     Objective:    BP 122/78   Pulse 79   Temp 98  F (36.7 C) (Oral)   Resp 14   Ht 5\' 8"  (1.727 m)   Wt 216 lb 12.8 oz (98.3 kg)   LMP 10/30/2014   SpO2 96%   BMI 32.96 kg/m   Wt Readings from Last 3 Encounters:  06/21/18 216 lb 12.8 oz (98.3 kg)  06/02/18 220 lb (99.8 kg)  05/17/18 229 lb 14.4 oz (104.3 kg)    Physical Exam  Constitutional: She appears well-developed and well-nourished.  HENT:  Nose: Rhinorrhea present. No mucosal edema.  Mouth/Throat: Mucous membranes are normal. No posterior oropharyngeal edema or posterior oropharyngeal erythema.  Thinning and scarring of the LEFT nasal septum (hx of cocaine abuse)  Eyes: EOM are normal. No scleral icterus.  Cardiovascular: Normal rate and regular rhythm.  Pulmonary/Chest: Effort normal. She has no decreased breath sounds. She has wheezes. She has no rhonchi.  Lymphadenopathy:    She has no cervical adenopathy.  Skin: No pallor.  Psychiatric: She has a normal mood and affect. Her behavior is normal. Her mood appears not anxious. She does not exhibit a depressed mood.    Results for orders placed or performed in visit on 03/31/18  QuantiFERON-TB Gold Plus  Result Value Ref Range   QuantiFERON-TB Gold Plus NEGATIVE NEGATIVE   NIL 0.02 IU/mL   Mitogen-NIL >10.00 IU/mL   TB1-NIL 0.01 IU/mL   TB2-NIL 0.01 IU/mL      Assessment & Plan:   Problem List Items Addressed This Visit      Respiratory   Allergic rhinitis    Continue antihistamine and singulair      Relevant Medications   montelukast (SINGULAIR) 10 MG tablet     Other   Cocaine abuse ([place])    Evidence of damage to left side nasal septum, no perforation; glad that she is in remission right now; no tussionex or controlled substances from me for cough       Other Visit Diagnoses    Mild asthma with exacerbation, unspecified whether persistent    -  Primary   will treat with prednisone, SABA, tessalon perles; patient wanted to switch to Symbicort; advised risk of asthma related death; singulair Rx too    Relevant Medications   predniSONE (DELTASONE) 20 MG tablet   montelukast (SINGULAIR) 10 MG tablet   budesonide-formoterol (SYMBICORT) 80-4.5 MCG/ACT inhaler       Follow up plan: No follow-ups on file.  An after-visit  summary was printed and given to the patient at [place].  Please see the patient instructions which may contain other information and recommendations beyond what is mentioned above in the assessment and plan.  Meds ordered this encounter  Medications  . predniSONE (DELTASONE) 20 MG tablet    Sig: Take 2 tablets (40 mg total) by mouth daily with breakfast.    Dispense:  10 tablet    Refill:  0  . benzonatate (TESSALON PERLES) 100 MG capsule    Sig: Take 1 capsule (100 mg total) by mouth every 8 (eight) hours as needed for cough.    Dispense:  30 capsule    Refill:  0  . montelukast (SINGULAIR) 10 MG tablet    Sig: Take 1 tablet (10 mg total) by mouth daily.    Dispense:  30 tablet    Refill:  0  . budesonide-formoterol (SYMBICORT) 80-4.5 MCG/ACT inhaler    Sig: Inhale 2 puffs into the lungs 2 (two) times daily.    Dispense:  1 Inhaler    Refill:  3    No orders of the defined types were placed in this encounter.

## 2018-06-21 NOTE — Assessment & Plan Note (Signed)
Continue antihistamine and singulair

## 2018-06-22 ENCOUNTER — Encounter: Payer: Self-pay | Admitting: Family Medicine

## 2018-07-13 ENCOUNTER — Other Ambulatory Visit: Payer: Self-pay | Admitting: Family Medicine

## 2018-07-13 DIAGNOSIS — J301 Allergic rhinitis due to pollen: Secondary | ICD-10-CM

## 2018-07-20 ENCOUNTER — Encounter: Payer: Self-pay | Admitting: Family Medicine

## 2018-07-20 ENCOUNTER — Ambulatory Visit: Payer: Medicaid Other | Admitting: Family Medicine

## 2018-07-20 ENCOUNTER — Encounter: Payer: Self-pay | Admitting: Emergency Medicine

## 2018-07-20 VITALS — BP 126/72 | HR 95 | Temp 97.9°F | Resp 16 | Ht 68.0 in | Wt 213.5 lb

## 2018-07-20 DIAGNOSIS — L239 Allergic contact dermatitis, unspecified cause: Secondary | ICD-10-CM

## 2018-07-20 MED ORDER — FAMOTIDINE 20 MG PO TABS: 20.0000 mg | ORAL_TABLET | Freq: Two times a day (BID) | ORAL | 0 refills | Status: DC

## 2018-07-20 MED ORDER — TRIAMCINOLONE ACETONIDE 0.1 % EX CREA: 1.0000 "application " | TOPICAL_CREAM | Freq: Three times a day (TID) | CUTANEOUS | 0 refills | Status: DC

## 2018-07-20 NOTE — Patient Instructions (Addendum)
Benadryl at night, Allegra for daytime.  Use Crea, 2-3 times a day as needed, Pepcid twice a day until rash is gone.  Follow up in 3-4 days if not improving. Rash A rash is a change in the color of the skin. A rash can also change the way your skin feels. There are many different conditions and factors that can cause a rash. Follow these instructions at home: Pay attention to any changes in your symptoms. Follow these instructions to help with your condition: Medicine Take or apply over-the-counter and prescription medicines only as told by your doctor. These may include:  Corticosteroid cream.  Anti-itch lotions.  Oral antihistamines.  Skin Care  Put cool compresses on the affected areas.  Try taking a bath with: ? Epsom salts. Follow the instructions on the packaging. You can get these at your local pharmacy or grocery store. ? Baking soda. Pour a small amount into the bath as told by your doctor. ? Colloidal oatmeal. Follow the instructions on the packaging. You can get this at your local pharmacy or grocery store.  Try putting baking soda paste onto your skin. Stir water into baking soda until it gets like a paste.  Do not scratch or rub your skin.  Avoid covering the rash. Make sure the rash is exposed to air as much as possible. General instructions  Avoid hot showers or baths, which can make itching worse. A cold shower may help.  Avoid scented soaps, detergents, and perfumes. Use gentle soaps, detergents, perfumes, and other cosmetic products.  Avoid anything that causes your rash. Keep a journal to help track what causes your rash. Write down: ? What you eat. ? What cosmetic products you use. ? What you drink. ? What you wear. This includes jewelry.  Keep all follow-up visits as told by your doctor. This is important. Contact a doctor if:  You sweat at night.  You lose weight.  You pee (urinate) more than normal.  You feel weak.  You throw up  (vomit).  Your skin or the whites of your eyes look yellow (jaundice).  Your skin: ? Tingles. ? Is numb.  Your rash: ? Does not go away after a few days. ? Gets worse.  You are: ? More thirsty than normal. ? More tired than normal.  You have: ? New symptoms. ? Pain in your belly (abdomen). ? A fever. ? Watery poop (diarrhea). Get help right away if:  Your rash covers all or most of your body. The rash may or may not be painful.  You have blisters that: ? Are on top of the rash. ? Grow larger. ? Grow together. ? Are painful. ? Are inside your nose or mouth.  You have a rash that: ? Looks like purple pinprick-sized spots all over your body. ? Has a "bull's eye" or looks like a target. ? Is red and painful, causes your skin to peel, and is not from being in the sun too long. This information is not intended to replace advice given to you by your health care provider. Make sure you discuss any questions you have with your health care provider. Document Released: 04/07/2008 Document Revised: 03/27/2016 Document Reviewed: 03/07/2015 Elsevier Interactive Patient Education  2018 [employer].

## 2018-07-20 NOTE — Progress Notes (Signed)
Name: Renee Benjamin   MRN: [id number]    DOB: Aug 26, 1981   Date:07/20/2018       Progress Note  Subjective  Chief Complaint  Chief Complaint  Patient presents with  . Rash    since yesterday. painful and itching    HPI  Pt presents with concern for new onset rash that is painful and itchy and started yesterday.  Areas affected are bilateral upper arms, chest, and neck.  She was recently on prednisone from Dr. Sanda Klein. She is compliant with singulair and allegra.  She has not used any new products - lotions, soaps, detergents, etc. And has not eaten any new foods that she knows of.  She has not started any new medications recently.  Has not been using Kenalog cream because she is out. No oral swelling, shortness of breath, chest pain. She has a history of bed bugs last year - has not traveled anywhere, no recent sick contacts with similar rash, no recent travel or insect bites that she knows of.  No fevers/chills.  Patient Active Problem List   Diagnosis Date Noted  . Polysubstance abuse ([place]) 01/22/2018  . Moderate recurrent major depression ([place]) 01/21/2018  . Cocaine abuse ([place]) 01/21/2018  . Cannabis abuse 01/21/2018  . Benzodiazepine abuse ([place]) 01/21/2018  . Barbiturate abuse ([place]) 01/21/2018  . 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 Allergic rhinitis 02/24/2017  . Vitamin B12 deficiency 02/24/2017  . Chronic right hip pain 04/10/2016  . Controlled substance agreement signed 02/01/2016  . Obesity 02/01/2016  . ADHD (attention deficit hyperactivity disorder) 01/09/2016  . Fibromyalgia 01/09/2016  . Torticollis, acute 10/08/2015  . Headache 10/02/2015  . Thoracic back pain 10/02/2015  . Lumbar pain 10/02/2015  . Arthralgia 10/02/2015  . Chronic tonsillitis 08/20/2015  . Migraine headache with aura 04/10/2015  . Hypothyroidism 04/10/2015  . PTSD (post-traumatic stress disorder) 04/10/2015  . Status post laparoscopic hysterectomy 01/03/2015  . Chronic female  pelvic pain 11/23/2014  . Plantar fasciitis of left foot   . Stress fracture     Social History   Tobacco Use  . Smoking status: Former Smoker    Packs/day: 0.25    Years: 4.00    Pack years: 1.00    Types: Cigarettes    Last attempt to quit: 04/17/2012    Years since quitting: 6.2  . Smokeless tobacco: Never Used  Substance Use Topics  . Alcohol use: No    Alcohol/week: 0.0 standard drinks     Current Outpatient Medications:  .  ARIPiprazole (ABILIFY) 5 MG tablet, Take 5 mg by mouth at bedtime., Disp: , Rfl: 0 .  budesonide-formoterol (SYMBICORT) 80-4.5 MCG/ACT inhaler, Inhale 2 puffs into the lungs 2 (two) times daily., Disp: 1 Inhaler, Rfl: 3 .  Cyanocobalamin (RA VITAMIN B-12 TR) 1000 MCG TBCR, Take 1,000 mcg by mouth daily., Disp: , Rfl:  .  fexofenadine (ALLEGRA) 180 MG tablet, Take 180 mg by mouth daily., Disp: , Rfl:  .  lisdexamfetamine (VYVANSE) 30 MG capsule, Take 30 mg by mouth daily., Disp: , Rfl:  .  meclizine (ANTIVERT) 25 MG tablet, Take 1 tablet (25 mg total) by mouth 3 (three) times daily as needed for dizziness., Disp: , Rfl: 0 .  montelukast (SINGULAIR) 10 MG tablet, TAKE 1 TABLET BY MOUTH EVERY DAY, Disp: 30 tablet, Rfl: 11 .  PROAIR HFA 108 (90 Base) MCG/ACT inhaler, INHALE 2 PUFFS BY MOUTH EVERY 6 HOURS IFNEEDED FOR WHEEZING OR SHORTNESS OF BREATH., Disp: 8.5 g, Rfl:  0 .  QUEtiapine (SEROQUEL) 25 MG tablet, Take 100 mg by mouth at bedtime as needed. , Disp: , Rfl: 0 .  benzonatate (TESSALON PERLES) 100 MG capsule, Take 1 capsule (100 mg total) by mouth every 8 (eight) hours as needed for cough. (Patient not taking: Reported on 07/20/2018), Disp: 30 capsule, Rfl: 0 .  ibuprofen (ADVIL,MOTRIN) 600 MG tablet, Take 1 tablet (600 mg total) by mouth every 6 (six) hours as needed. (Patient not taking: Reported on 06/21/2018), Disp: 30 tablet, Rfl: 0 .  levothyroxine (SYNTHROID, LEVOTHROID) 75 MCG tablet, Take 1 tablet (75 mcg total) by mouth daily before breakfast.,  Disp: 30 tablet, Rfl: 5 .  potassium chloride (K-DUR) 10 MEQ tablet, Take 1 tablet (10 mEq total) by mouth 2 (two) times daily for 5 days., Disp: 10 tablet, Rfl: 0 .  predniSONE (DELTASONE) 20 MG tablet, Take 2 tablets (40 mg total) by mouth daily with breakfast. (Patient not taking: Reported on 07/20/2018), Disp: 10 tablet, Rfl: 0 .  Tiotropium Bromide Monohydrate (SPIRIVA RESPIMAT) 2.5 MCG/ACT AERS, Inhale 2.5 mcg into the lungs daily. (Patient not taking: Reported on 06/21/2018), Disp: 4 g, Rfl: 0 .  triamcinolone cream (KENALOG) 0.1 %, Apply 1 application topically 3 (three) times daily. If needed; too strong for face, groin, underarms (Patient not taking: Reported on 03/31/2018), Disp: 45 g, Rfl: 0  Allergies  Allergen Reactions  . Depakote [Divalproex Sodium] Hives  . Morphine Hives  . Propoxyphene Other (See Comments)  . Tramadol Nausea Only  . Tramadol Nausea And Vomiting and Other (See Comments)  . Darvocet [Propoxyphene N-Acetaminophen] Rash  . Valproic Acid Rash    I personally reviewed active problem list, medication list, allergies with the patient/caregiver today.  ROS  Constitutional: Negative for fever or weight change.  Respiratory: Negative for cough and shortness of breath.   Cardiovascular: Negative for chest pain or palpitations.  Gastrointestinal: Negative for abdominal pain, no bowel changes.  Musculoskeletal: Negative for gait problem or joint swelling.  Skin: Positive for rash.  Neurological: Negative for dizziness or headache.  No other specific complaints in a complete review of systems (except as listed in HPI above).  Objective  Vitals:   07/20/18 0932  BP: 126/72  Pulse: 95  Resp: 16  Temp: 97.9 F (36.6 C)  TempSrc: Oral  SpO2: 99%  Weight: 213 lb 8 oz (96.8 kg)  Height: 5\' 8"  (1.727 m)   Body mass index is 32.46 kg/m.  Nursing Note and Vital Signs reviewed.  Physical Exam  Constitutional: Patient appears well-developed and  well-nourished. No distress.  HENT: Head: Normocephalic and atraumatic. Mouth/Throat: Oropharynx is clear and moist. No oropharyngeal exudate or tonsillar swelling.  Eyes: Conjunctivae and EOM are normal. No scleral icterus.  Pupils are equal, round, and reactive to light.  Neck: Normal range of motion. Neck supple. No JVD present. No thyromegaly present.  Cardiovascular: Normal rate, regular rhythm and normal heart sounds.  No murmur heard. No BLE edema. Pulmonary/Chest: Effort normal and breath sounds normal. No respiratory distress. Musculoskeletal: Normal range of motion, no joint effusions. No gross deformities Neurological: Pt is alert and oriented to person, place, and time. No cranial nerve deficit. Coordination, balance, strength, speech and gait are normal.  Skin: Skin is warm and dry. Maculopapular rash with scattered lesions to the chest, right breast, BUE (a few spots on forearms, but mostly concentrated on the upper arms), and neck, 2 lesions just below the lip.  Spares the palms/soles, no leg/abdomen, or back  involvement yet. Psychiatric: Patient has a normal mood and affect. behavior is normal. Judgment and thought content normal.  No results found for this or any previous visit (from the past 72 hour(s)).  Assessment & Plan  1. Allergic dermatitis - Benadryl OTC at night - triamcinolone cream (KENALOG) 0.1 %; Apply 1 application topically 3 (three) times daily. If needed; too strong for face, groin, underarms, or breasts  Dispense: 45 g; Refill: 0 - famotidine (PEPCID) 20 MG tablet; Take 1 tablet (20 mg total) by mouth 2 (two) times daily for 7 days.  Dispense: 14 tablet; Refill: 0  -Red flags and when to present for emergency care or RTC including fever >101.54F, chest pain, shortness of breath, new/worsening/un-resolving symptoms, oral swelling reviewed with patient at time of visit. Follow up and care instructions discussed and provided in AVS.

## 2018-08-19 ENCOUNTER — Encounter: Payer: Self-pay | Admitting: Emergency Medicine

## 2018-08-19 ENCOUNTER — Emergency Department
Admission: EM | Admit: 2018-08-19 | Discharge: 2018-08-19 | Disposition: A | Discharge status: Home / Self Care | Payer: Medicaid Other | Source: Home / Self Care | Attending: Emergency Medicine | Admitting: Emergency Medicine

## 2018-08-19 DIAGNOSIS — Z87891 Personal history of nicotine dependence: Secondary | ICD-10-CM | POA: Insufficient documentation

## 2018-08-19 DIAGNOSIS — J45909 Unspecified asthma, uncomplicated: Secondary | ICD-10-CM

## 2018-08-19 DIAGNOSIS — R1013 Epigastric pain: Secondary | ICD-10-CM | POA: Insufficient documentation

## 2018-08-19 DIAGNOSIS — R197 Diarrhea, unspecified: Principal | ICD-10-CM

## 2018-08-19 DIAGNOSIS — Z79899 Other long term (current) drug therapy: Secondary | ICD-10-CM

## 2018-08-19 DIAGNOSIS — R112 Nausea with vomiting, unspecified: Secondary | ICD-10-CM

## 2018-08-19 DIAGNOSIS — E039 Hypothyroidism, unspecified: Secondary | ICD-10-CM

## 2018-08-19 LAB — COMPREHENSIVE METABOLIC PANEL
ALT: 9 U/L (ref 0–44)
AST: 16 U/L (ref 15–41)
Albumin: 4.2 g/dL (ref 3.5–5.0)
Alkaline Phosphatase: 58 U/L (ref 38–126)
Anion gap: 11 (ref 5–15)
BUN: 7 mg/dL (ref 6–20)
CO2: 24 mmol/L (ref 22–32)
Calcium: 8.7 mg/dL — ABNORMAL LOW (ref 8.9–10.3)
Chloride: 108 mmol/L (ref 98–111)
Creatinine, Ser: 0.8 mg/dL (ref 0.44–1.00)
GFR calc Af Amer: >60 mL/min (ref >60)
GFR calc non Af Amer: >60 mL/min (ref >60)
Glucose, Bld: 91 mg/dL (ref 70–99)
Potassium: 3.5 mmol/L (ref 3.5–5.1)
Sodium: 143 mmol/L (ref 135–145)
Total Bilirubin: 0.4 mg/dL (ref 0.3–1.2)
Total Protein: 6.9 g/dL (ref 6.5–8.1)

## 2018-08-19 LAB — CBC
HCT: 39.2 % (ref 36.0–46.0)
Hemoglobin: 13.4 g/dL (ref 12.0–15.0)
MCH: 31.4 pg (ref 26.0–34.0)
MCHC: 34.2 g/dL (ref 30.0–36.0)
MCV: 91.8 fL (ref 80.0–100.0)
Platelets: 275 10*3/uL (ref 150–400)
RBC: 4.27 MIL/uL (ref 3.87–5.11)
RDW: 11.9 % (ref 11.5–15.5)
WBC: 6.2 10*3/uL (ref 4.0–10.5)
nRBC: 0 % (ref 0.0–0.2)

## 2018-08-19 LAB — LIPASE, BLOOD: Lipase: 28 U/L (ref 11–51)

## 2018-08-19 MED ORDER — ONDANSETRON 4 MG PO TBDP
4.0000 mg | ORAL_TABLET | Freq: Once | ORAL | Status: AC | PRN
  Administered 2018-08-19: 4 mg via ORAL
  Filled 2018-08-19: qty 1

## 2018-08-19 MED ORDER — ONDANSETRON HCL 4 MG PO TABS: 4.0000 mg | ORAL_TABLET | Freq: Three times a day (TID) | ORAL | 0 refills | Status: DC | PRN

## 2018-08-19 NOTE — ED Provider Notes (Signed)
[facility] Emergency Department Provider Note ____________________________________________   I have reviewed the triage vital signs and the triage nursing note.  HISTORY  Chief Complaint Abdominal Pain   Historian Patient  HPI Renee Benjamin is a 37 y.o. female with history of prior keratitis, presents with several days of watery, nonbloody diarrhea followed by today having nausea as well as nonbloody nonbilious emesis as well multiple times.  She is been having some epigastric discomfort which is moderate and in association with the vomiting diarrhea feels similar to her when she had prior pink otitis.  No fevers.  No shortness breath or trouble breathing.  Symptoms are moderate.  Nothing seems to make it worse or better.     Past Medical History:  Diagnosis Date  . ADHD (attention deficit hyperactivity disorder) 01/09/2016  . Allergy   . Anemia   . Anxiety   . Anxiety    separation anxiety  . Asthma   . Chronic right hip pain 04/10/2016  . Collagen vascular disease ([place])   . Endometriosis   . Fibromyalgia 01/09/2016  . Headache    otc med prn  . Hypothyroidism   . Plantar fasciitis of left foot 08.29.14   PLANTAR HEEL ,FLUID AROUND MEDIAL BAND  . Polycystic ovarian disease   . Polysubstance abuse ([place]) 01/22/2018   ER visit March 2019: Drug screen is positive for marijuana, cocaine, benzodiazepines, barbiturates, and opiates  . Stress fracture 08.15.14   RIGHT   . Thoracic back pain   . Thyroid disease   . Vitamin D deficiency disease     Patient Active Problem List   Diagnosis Date Noted  . Polysubstance abuse ([place]) 01/22/2018  . Moderate recurrent major depression ([place]) 01/21/2018  . Cocaine abuse ([place]) 01/21/2018  . Cannabis abuse 01/21/2018  . Benzodiazepine abuse ([place]) 01/21/2018  . Barbiturate abuse ([place]) 01/21/2018  . Opiate abuse, episodic ([place]) 01/21/2018  . Allergic rhinitis 02/24/2017  . Vitamin B12 deficiency  02/24/2017  . Chronic right hip pain 04/10/2016  . Controlled substance agreement signed 02/01/2016  . Obesity 02/01/2016  . ADHD (attention deficit hyperactivity disorder) 01/09/2016  . Fibromyalgia 01/09/2016  . Torticollis, acute 10/08/2015  . Headache 10/02/2015  . Thoracic back pain 10/02/2015  . Lumbar pain 10/02/2015  . Arthralgia 10/02/2015  . Chronic tonsillitis 08/20/2015  . Migraine headache with aura 04/10/2015  . Hypothyroidism 04/10/2015  . PTSD (post-traumatic stress disorder) 04/10/2015  . Status post laparoscopic hysterectomy 01/03/2015  . Chronic female pelvic pain 11/23/2014  . Plantar fasciitis of left foot   . Stress fracture     Past Surgical History:  Procedure Laterality Date  . ABDOMINAL HYSTERECTOMY  March 2016   Due to uterus being stiched into C Section incision  . APPENDECTOMY    . APPENDECTOMY  2008  . CESAREAN SECTION    . CESAREAN SECTION  2014  . LAPAROSCOPIC BILATERAL SALPINGECTOMY Bilateral 01/02/2015   Procedure: LAPAROSCOPIC BILATERAL SALPINGECTOMY;  Surgeon: Osborne Oman, MD;  Location: [facility];  Service: Gynecology;  Laterality: Bilateral;  . LAPAROSCOPIC HYSTERECTOMY N/A 01/02/2015   Procedure: HYSTERECTOMY TOTAL LAPAROSCOPIC;  Surgeon: Osborne Oman, MD;  Location: [facility];  Service: Gynecology;  Laterality: N/A;  . SHOULDER SURGERY Right   . SHOULDER SURGERY  2001   right    Prior to Admission medications   Medication Sig Start Date End Date Taking? Authorizing Provider  ARIPiprazole (ABILIFY) 5 MG tablet Take 5 mg by mouth at bedtime. 03/09/18  [provider]  budesonide-formoterol (SYMBICORT) 80-4.5 MCG/ACT inhaler Inhale 2 puffs into the lungs 2 (two) times daily. 06/21/18   Lada, Satira Anis, MD  Cyanocobalamin (RA VITAMIN B-12 TR) 1000 MCG TBCR Take 1,000 mcg by mouth daily. 12/06/15   [provider]  famotidine (PEPCID) 20 MG tablet Take 1 tablet (20 mg total) by mouth 2 (two) times daily for 7 days. 07/20/18  07/27/18  Hubbard Hartshorn, FNP  fexofenadine (ALLEGRA) 180 MG tablet Take 180 mg by mouth daily.    [provider]  levothyroxine (SYNTHROID, LEVOTHROID) 75 MCG tablet Take 1 tablet (75 mcg total) by mouth daily before breakfast. 03/15/18   Lada, Satira Anis, MD  lisdexamfetamine (VYVANSE) 30 MG capsule Take 30 mg by mouth daily.    [provider]  meclizine (ANTIVERT) 25 MG tablet Take 1 tablet (25 mg total) by mouth 3 (three) times daily as needed for dizziness. 08/06/16   Lada, Satira Anis, MD  montelukast (SINGULAIR) 10 MG tablet TAKE 1 TABLET BY MOUTH EVERY DAY 07/14/18   Lada, Satira Anis, MD  ondansetron (ZOFRAN) 4 MG tablet Take 1 tablet (4 mg total) by mouth every 8 (eight) hours as needed for nausea or vomiting. 08/19/18   Lisa Roca, MD  potassium chloride (K-DUR) 10 MEQ tablet Take 1 tablet (10 mEq total) by mouth 2 (two) times daily for 5 days. 03/08/18 03/13/18  Laban Emperor, PA-C  PROAIR HFA 108 (90 Base) MCG/ACT inhaler INHALE 2 PUFFS BY MOUTH EVERY 6 HOURS IFNEEDED FOR WHEEZING OR SHORTNESS OF BREATH. 02/23/18   Poulose, Bethel Born, NP  QUEtiapine (SEROQUEL) 25 MG tablet Take 100 mg by mouth at bedtime as needed.  03/09/18   [provider]  triamcinolone cream (KENALOG) 0.1 % Apply 1 application topically 3 (three) times daily. If needed; too strong for face, groin, underarms, or breasts 07/20/18   Hubbard Hartshorn, FNP    Allergies  Allergen Reactions  . Depakote [Divalproex Sodium] Hives  . Morphine Hives  . Propoxyphene Other (See Comments)  . Tramadol Nausea Only  . Tramadol Nausea And Vomiting and Other (See Comments)  . Darvocet [Propoxyphene N-Acetaminophen] Rash  . Valproic Acid Rash    Family History  Problem Relation Age of Onset  . Diabetes Father   . Osteoporosis Mother   . Cancer Maternal Grandmother        breast  . Osteoporosis Maternal Grandmother     Social History Social History   Tobacco Use  . Smoking status: Former Smoker     Packs/day: 0.25    Years: 4.00    Pack years: 1.00    Types: Cigarettes    Last attempt to quit: 04/17/2012    Years since quitting: 6.3  . Smokeless tobacco: Never Used  Substance Use Topics  . Alcohol use: No    Alcohol/week: 0.0 standard drinks  . Drug use: No    Review of Systems  Constitutional: Negative for fever. Eyes: Negative for visual changes. ENT: Negative for sore throat. Cardiovascular: Negative for chest pain. Respiratory: Negative for shortness of breath. Gastrointestinal: Epigastric pain as per HPI with vomiting and diarrhea.. Genitourinary: Negative for dysuria. Musculoskeletal: Negative for back pain. Skin: Negative for rash. Neurological: Negative for headache.  ____________________________________________   PHYSICAL EXAM:  VITAL SIGNS: ED Triage Vitals  Enc Vitals Group     BP 08/19/18 0935 113/75     Pulse Rate 08/19/18 0935 84     Resp 08/19/18 0935 18  Temp 08/19/18 0935 98 F (36.7 C)     Temp Source 08/19/18 0932 Oral     SpO2 08/19/18 0935 98 %     Weight 08/19/18 0930 217 lb 6 oz (98.6 kg)     Height 08/19/18 0930 5\' 8"  (1.727 m)     Head Circumference --      Peak Flow --      Pain Score 08/19/18 0930 7     Pain Loc --      Pain Edu? --      Excl. in [place]? --      Constitutional: Alert and oriented.  HEENT      Head: Normocephalic and atraumatic.      Eyes: Conjunctivae are normal. Pupils equal and round.       Ears:         Nose: No congestion/rhinnorhea.      Mouth/Throat: Mucous membranes are moist.      Neck: No stridor. Cardiovascular/Chest: Normal rate, regular rhythm.  No murmurs, rubs, or gallops. Respiratory: Normal respiratory effort without tachypnea nor retractions. Breath sounds are clear and equal bilaterally. No wheezes/rales/rhonchi. Gastrointestinal: Soft. No distention, no guarding, no rebound.  Mild epigastric tenderness to palpation.  No focal right upper quadrant no lower abdominal tenderness..     Genitourinary/rectal:Deferred Musculoskeletal: Nontender with normal range of motion in all extremities. No joint effusions.  No lower extremity tenderness.  No edema. Neurologic:  Normal speech and language. No gross or focal neurologic deficits are appreciated. Skin:  Skin is warm, dry and intact. No rash noted. Psychiatric: Mood and affect are normal. Speech and behavior are normal. Patient exhibits appropriate insight and judgment.   ____________________________________________  LABS (pertinent positives/negatives) I, Lisa Roca, MD the attending physician have reviewed the labs noted below.  Labs Reviewed  COMPREHENSIVE METABOLIC PANEL - Abnormal; Notable for the following components:      Result Value   Calcium 8.7 (*)    All other components within normal limits  LIPASE, BLOOD  CBC  URINALYSIS, COMPLETE (UACMP) WITH MICROSCOPIC    ____________________________________________    EKG I, Lisa Roca, MD, the attending physician have personally viewed and interpreted all ECGs.  75 bpm.  Normal sinus rhythm with sinus arrhythmia.  Narrow QRS.  Normal axis.  Nonspecific flattened T waves. ____________________________________________  RADIOLOGY   None __________________________________________  PROCEDURES  Procedure(s) performed: None  Procedures  Critical Care performed: None   ____________________________________________  ED COURSE / ASSESSMENT AND PLAN  Pertinent labs & imaging results that were available during my care of the patient were reviewed by me and considered in my medical decision making (see chart for details).     She is overall well-appearing without clinical dehydration.  Soft abdomen, mild tenderness in epigastrium.  With the nausea plus vomiting plus diarrhea, seems most likely viral in etiology.  We discussed possibility of chronic pink otitis without lipase elevation and if this feels similar to her she could try liquid diet for 24  hours and then advance as tolerated.  At this point not suspicious for biliary etiology.  Auditory studies are overall reassuring.  Patient okay for outpatient symptomatic management with Zofran.    CONSULTATIONS:   None   Patient / Family / Caregiver informed of clinical course, medical decision-making process, and agree with plan.   I discussed return precautions, follow-up instructions, and discharge instructions with patient and/or family.  Discharge Instructions : Return to the emergency room immediately for any worsening or uncontrolled pain,  fever, vomiting blood, black or bloody stools, sent for dehydration such as dry mouth or not making urine, or any other symptoms concerning to you.  As we discussed, eat a bland liquid diet for 24 hours and then increase as tolerated to solids and then back to regular diet in case this might be a mild form pancreatitis without elevation in the blood work.    ___________________________________________   FINAL CLINICAL IMPRESSION(S) / ED DIAGNOSES   Final diagnoses:  Nausea vomiting and diarrhea      ___________________________________________         Note: This dictation was prepared with Dragon dictation. Any transcriptional errors that result from this process are unintentional    Lisa Roca, MD 08/19/18 1207

## 2018-08-19 NOTE — Discharge Instructions (Signed)
Return to the emergency room immediately for any worsening or uncontrolled pain, fever, vomiting blood, black or bloody stools, sent for dehydration such as dry mouth or not making urine, or any other symptoms concerning to you.  As we discussed, eat a bland liquid diet for 24 hours and then increase as tolerated to solids and then back to regular diet in case this might be a mild form pancreatitis without elevation in the blood work.

## 2018-08-19 NOTE — ED Triage Notes (Signed)
PT arrives with complaints of upper mid abdominal pain that radiates to her pain. Pt has hx of pancreatitis with frequent flares. Pt also reports n/v/d that started 2 days ago. PT reports 3 episodes of emesis today and 4 episodes of diarrhea.

## 2018-08-19 NOTE — ED Notes (Signed)
ED Provider at bedside. 

## 2018-08-20 ENCOUNTER — Inpatient Hospital Stay: Payer: Medicaid Other

## 2018-08-20 ENCOUNTER — Emergency Department: Payer: Medicaid Other

## 2018-08-20 ENCOUNTER — Encounter: Payer: Self-pay | Admitting: Emergency Medicine

## 2018-08-20 ENCOUNTER — Other Ambulatory Visit: Payer: Self-pay

## 2018-08-20 ENCOUNTER — Inpatient Hospital Stay
Admission: EM | Admit: 2018-08-20 | Discharge: 2018-08-21 | DRG: 440 | Disposition: A | Discharge status: Home / Self Care | Payer: Medicaid Other | Attending: Specialist | Admitting: Specialist

## 2018-08-20 DIAGNOSIS — E039 Hypothyroidism, unspecified: Secondary | ICD-10-CM | POA: Diagnosis present

## 2018-08-20 DIAGNOSIS — Z8262 Family history of osteoporosis: Secondary | ICD-10-CM | POA: Diagnosis not present

## 2018-08-20 DIAGNOSIS — Z885 Allergy status to narcotic agent status: Secondary | ICD-10-CM | POA: Diagnosis not present

## 2018-08-20 DIAGNOSIS — F319 Bipolar disorder, unspecified: Secondary | ICD-10-CM | POA: Diagnosis present

## 2018-08-20 DIAGNOSIS — Z23 Encounter for immunization: Secondary | ICD-10-CM

## 2018-08-20 DIAGNOSIS — J449 Chronic obstructive pulmonary disease, unspecified: Secondary | ICD-10-CM | POA: Diagnosis present

## 2018-08-20 DIAGNOSIS — F909 Attention-deficit hyperactivity disorder, unspecified type: Secondary | ICD-10-CM | POA: Diagnosis present

## 2018-08-20 DIAGNOSIS — K859 Acute pancreatitis without necrosis or infection, unspecified: Principal | ICD-10-CM | POA: Diagnosis present

## 2018-08-20 DIAGNOSIS — Z833 Family history of diabetes mellitus: Secondary | ICD-10-CM

## 2018-08-20 DIAGNOSIS — Z79890 Hormone replacement therapy (postmenopausal): Secondary | ICD-10-CM

## 2018-08-20 DIAGNOSIS — Z888 Allergy status to other drugs, medicaments and biological substances status: Secondary | ICD-10-CM | POA: Diagnosis not present

## 2018-08-20 DIAGNOSIS — Z7951 Long term (current) use of inhaled steroids: Secondary | ICD-10-CM

## 2018-08-20 DIAGNOSIS — R1011 Right upper quadrant pain: Secondary | ICD-10-CM | POA: Diagnosis present

## 2018-08-20 DIAGNOSIS — Z87891 Personal history of nicotine dependence: Secondary | ICD-10-CM

## 2018-08-20 DIAGNOSIS — Z8719 Personal history of other diseases of the digestive system: Secondary | ICD-10-CM | POA: Diagnosis present

## 2018-08-20 DIAGNOSIS — K529 Noninfective gastroenteritis and colitis, unspecified: Secondary | ICD-10-CM | POA: Diagnosis not present

## 2018-08-20 LAB — CBC WITH DIFFERENTIAL/PLATELET
Abs Immature Granulocytes: 0.01 10*3/uL (ref 0.00–0.07)
Basophils Absolute: 0 10*3/uL (ref 0.0–0.1)
Basophils Relative: 1 %
Eosinophils Absolute: 0.2 10*3/uL (ref 0.0–0.5)
Eosinophils Relative: 2 %
HCT: 37.6 % (ref 36.0–46.0)
Hemoglobin: 12.6 g/dL (ref 12.0–15.0)
Immature Granulocytes: 0 %
Lymphocytes Relative: 29 %
Lymphs Abs: 1.9 10*3/uL (ref 0.7–4.0)
MCH: 31.3 pg (ref 26.0–34.0)
MCHC: 33.5 g/dL (ref 30.0–36.0)
MCV: 93.5 fL (ref 80.0–100.0)
Monocytes Absolute: 0.5 10*3/uL (ref 0.1–1.0)
Monocytes Relative: 7 %
Neutro Abs: 4 10*3/uL (ref 1.7–7.7)
Neutrophils Relative %: 61 %
Platelets: 244 10*3/uL (ref 150–400)
RBC: 4.02 MIL/uL (ref 3.87–5.11)
RDW: 11.9 % (ref 11.5–15.5)
WBC: 6.6 10*3/uL (ref 4.0–10.5)
nRBC: 0 % (ref 0.0–0.2)

## 2018-08-20 LAB — LIPASE, BLOOD: Lipase: 254 U/L — ABNORMAL HIGH (ref 11–51)

## 2018-08-20 LAB — COMPREHENSIVE METABOLIC PANEL
ALT: 8 U/L (ref 0–44)
AST: 18 U/L (ref 15–41)
Albumin: 3.5 g/dL (ref 3.5–5.0)
Alkaline Phosphatase: 51 U/L (ref 38–126)
Anion gap: 10 (ref 5–15)
BUN: 10 mg/dL (ref 6–20)
CO2: 24 mmol/L (ref 22–32)
Calcium: 8.2 mg/dL — ABNORMAL LOW (ref 8.9–10.3)
Chloride: 110 mmol/L (ref 98–111)
Creatinine, Ser: 0.84 mg/dL (ref 0.44–1.00)
GFR calc Af Amer: >60 mL/min (ref >60)
GFR calc non Af Amer: >60 mL/min (ref >60)
Glucose, Bld: 88 mg/dL (ref 70–99)
Potassium: 3.8 mmol/L (ref 3.5–5.1)
Sodium: 144 mmol/L (ref 135–145)
Total Bilirubin: 0.6 mg/dL (ref 0.3–1.2)
Total Protein: 6.3 g/dL — ABNORMAL LOW (ref 6.5–8.1)

## 2018-08-20 MED ORDER — KETOROLAC TROMETHAMINE 30 MG/ML IJ SOLN
15.0000 mg | Freq: Once | INTRAMUSCULAR | Status: AC
  Administered 2018-08-20: 15 mg via INTRAVENOUS
  Filled 2018-08-20: qty 1

## 2018-08-20 MED ORDER — ONDANSETRON HCL 4 MG PO TABS: 4.0000 mg | ORAL_TABLET | Freq: Four times a day (QID) | ORAL | Status: DC | PRN

## 2018-08-20 MED ORDER — KETOROLAC TROMETHAMINE 30 MG/ML IJ SOLN
30.0000 mg | Freq: Four times a day (QID) | INTRAMUSCULAR | Status: DC | PRN
  Administered 2018-08-20: 30 mg via INTRAVENOUS
  Filled 2018-08-20: qty 1

## 2018-08-20 MED ORDER — FENTANYL CITRATE (PF) 100 MCG/2ML IJ SOLN
50.0000 ug | INTRAMUSCULAR | Status: DC | PRN
  Administered 2018-08-20: 50 ug via INTRAVENOUS
  Filled 2018-08-20: qty 2

## 2018-08-20 MED ORDER — LORATADINE 10 MG PO TABS
10.0000 mg | ORAL_TABLET | Freq: Every day | ORAL | Status: DC
  Administered 2018-08-21: 10 mg via ORAL
  Filled 2018-08-20: qty 1

## 2018-08-20 MED ORDER — POLYETHYLENE GLYCOL 3350 17 G PO PACK: 17.0000 g | PACK | Freq: Every day | ORAL | Status: DC | PRN

## 2018-08-20 MED ORDER — LEVOTHYROXINE SODIUM 75 MCG PO TABS
75.0000 ug | ORAL_TABLET | Freq: Every day | ORAL | Status: DC
  Administered 2018-08-21: 75 ug via ORAL
  Filled 2018-08-20: qty 1

## 2018-08-20 MED ORDER — INFLUENZA VAC SPLIT QUAD 0.5 ML IM SUSY
0.5000 mL | PREFILLED_SYRINGE | INTRAMUSCULAR | Status: AC
  Administered 2018-08-21: 0.5 mL via INTRAMUSCULAR
  Filled 2018-08-20: qty 0.5

## 2018-08-20 MED ORDER — ALBUTEROL SULFATE (2.5 MG/3ML) 0.083% IN NEBU: 2.5000 mg | INHALATION_SOLUTION | Freq: Four times a day (QID) | RESPIRATORY_TRACT | Status: DC | PRN

## 2018-08-20 MED ORDER — LISDEXAMFETAMINE DIMESYLATE 30 MG PO CAPS
30.0000 mg | ORAL_CAPSULE | Freq: Every day | ORAL | Status: DC
  Administered 2018-08-21: 30 mg via ORAL
  Filled 2018-08-20: qty 1

## 2018-08-20 MED ORDER — ALBUTEROL SULFATE HFA 108 (90 BASE) MCG/ACT IN AERS: 2.0000 | INHALATION_SPRAY | Freq: Four times a day (QID) | RESPIRATORY_TRACT | Status: DC | PRN

## 2018-08-20 MED ORDER — QUETIAPINE FUMARATE 100 MG PO TABS
100.0000 mg | ORAL_TABLET | Freq: Every day | ORAL | Status: DC
  Administered 2018-08-20: 100 mg via ORAL
  Filled 2018-08-20 (×2): qty 1
  Filled 2018-08-20: qty 4

## 2018-08-20 MED ORDER — PROMETHAZINE HCL 25 MG/ML IJ SOLN
12.5000 mg | Freq: Four times a day (QID) | INTRAMUSCULAR | Status: DC | PRN
  Administered 2018-08-20: 12.5 mg via INTRAVENOUS
  Filled 2018-08-20: qty 1

## 2018-08-20 MED ORDER — SODIUM CHLORIDE 0.9 % IV SOLN
INTRAVENOUS | Status: DC
  Administered 2018-08-20 – 2018-08-21 (×3): via INTRAVENOUS

## 2018-08-20 MED ORDER — SODIUM CHLORIDE 0.9 % IV BOLUS
1000.0000 mL | Freq: Once | INTRAVENOUS | Status: AC
  Administered 2018-08-20: 1000 mL via INTRAVENOUS

## 2018-08-20 MED ORDER — ONDANSETRON HCL 4 MG/2ML IJ SOLN: 4.0000 mg | Freq: Four times a day (QID) | INTRAMUSCULAR | Status: DC | PRN

## 2018-08-20 MED ORDER — ARIPIPRAZOLE 5 MG PO TABS
5.0000 mg | ORAL_TABLET | Freq: Every day | ORAL | Status: DC
  Administered 2018-08-20: 5 mg via ORAL
  Filled 2018-08-20 (×2): qty 1

## 2018-08-20 MED ORDER — MOMETASONE FURO-FORMOTEROL FUM 100-5 MCG/ACT IN AERO
2.0000 | INHALATION_SPRAY | Freq: Two times a day (BID) | RESPIRATORY_TRACT | Status: DC
  Administered 2018-08-20 – 2018-08-21 (×2): 2 via RESPIRATORY_TRACT
  Filled 2018-08-20: qty 8.8

## 2018-08-20 MED ORDER — OXYCODONE HCL 5 MG PO TABS
5.0000 mg | ORAL_TABLET | ORAL | Status: DC | PRN
  Administered 2018-08-20 – 2018-08-21 (×3): 5 mg via ORAL
  Filled 2018-08-20 (×3): qty 1

## 2018-08-20 MED ORDER — MONTELUKAST SODIUM 10 MG PO TABS
10.0000 mg | ORAL_TABLET | Freq: Every day | ORAL | Status: DC
  Administered 2018-08-21: 10 mg via ORAL
  Filled 2018-08-20: qty 1

## 2018-08-20 NOTE — ED Notes (Signed)
First Nurse Note: Patient states she was seen here earlier this week and diagnosed with pancreatitis, here this AM stating she does not feel any better.  Alert and oriented.  Skin color is good.

## 2018-08-20 NOTE — ED Notes (Signed)
Pt is AOx4, vital signs are stable, she complains of 7/10 LUQ pain accompanied by a headache. She nausea, dizziness or chest pain at this time. Pt is in bed with rails upx2, on monitor. We will continue to monitor the pt.

## 2018-08-20 NOTE — ED Notes (Signed)
Report to Beth, RN

## 2018-08-20 NOTE — ED Triage Notes (Signed)
C/O continued abdominal pain and nausea.  Seen through ED yesterday for same complaints and diagnosed with possible pancreatitis and / or gallstones.  Patient states she did not want to stay for [place] yesterday.  States symptoms worsening overnight.

## 2018-08-20 NOTE — ED Notes (Signed)
Patient transported to MRI 

## 2018-08-20 NOTE — ED Notes (Signed)
ED Provider at bedside. 

## 2018-08-20 NOTE — H&P (Addendum)
[facility] [place] NAME: Renee Benjamin    MR#:  [id number]  DATE OF BIRTH:  [birth year]  DATE OF ADMISSION:  08/20/2018  PRIMARY CARE PHYSICIAN: Arnetha Courser, MD   REQUESTING/REFERRING PHYSICIAN: dr Quentin Cornwall  CHIEF COMPLAINT:  epigastric abdominal pain nausea and vomiting for two days  HISTORY OF PRESENT ILLNESS:  [name]  is a 37 y.o. female with a known history of bipolar disorder, anxiety, ADHD comes to the emergency room with epigastric pain and right upper quadrant pain for last two days with nausea vomiting and unable to keep anything orally. Patient was seen in the emergency room yesterday she was given some IV fluids IV Zofran and all her labs including lipase were normal. She was sent home with outpatient follow-up with PCP. She came back from home with increasing nausea vomiting. Found to have lipase of 250. Abdominal ultrasound is inconclusive. Given her symptoms of abdominal pain nausea vomiting and acute pancreatitis will admit her for further evaluation and management.  denies any fever chest pain or shortness of breath. Denies ETOH abuse  PAST MEDICAL HISTORY:   Past Medical History:  Diagnosis Date  . ADHD (attention deficit hyperactivity disorder) 01/09/2016  . Allergy   . Anemia   . Anxiety   . Anxiety    separation anxiety  . Asthma   . Chronic right hip pain 04/10/2016  . Collagen vascular disease ([place])   . Endometriosis   . Fibromyalgia 01/09/2016  . Headache    otc med prn  . Hypothyroidism   . Plantar fasciitis of left foot 08.29.14   PLANTAR HEEL ,FLUID AROUND MEDIAL BAND  . Polycystic ovarian disease   . Polysubstance abuse ([place]) 01/22/2018   ER visit March 2019: Drug screen is positive for marijuana, cocaine, benzodiazepines, barbiturates, and opiates  . Stress fracture 08.15.14   RIGHT   . Thoracic back pain   . Thyroid disease   . Vitamin D deficiency disease     PAST SURGICAL  HISTOIRY:   Past Surgical History:  Procedure Laterality Date  . ABDOMINAL HYSTERECTOMY  March 2016   Due to uterus being stiched into C Section incision  . APPENDECTOMY    . APPENDECTOMY  2008  . CESAREAN SECTION    . CESAREAN SECTION  2014  . LAPAROSCOPIC BILATERAL SALPINGECTOMY Bilateral 01/02/2015   Procedure: LAPAROSCOPIC BILATERAL SALPINGECTOMY;  Surgeon: Osborne Oman, MD;  Location: [facility];  Service: Gynecology;  Laterality: Bilateral;  . LAPAROSCOPIC HYSTERECTOMY N/A 01/02/2015   Procedure: HYSTERECTOMY TOTAL LAPAROSCOPIC;  Surgeon: Osborne Oman, MD;  Location: [facility];  Service: Gynecology;  Laterality: N/A;  . SHOULDER SURGERY Right   . SHOULDER SURGERY  2001   right    SOCIAL HISTORY:   Social History   Tobacco Use  . Smoking status: Former Smoker    Packs/day: 0.25    Years: 4.00    Pack years: 1.00    Types: Cigarettes    Last attempt to quit: 04/17/2012    Years since quitting: 6.3  . Smokeless tobacco: Never Used  Substance Use Topics  . Alcohol use: No    Alcohol/week: 0.0 standard drinks    FAMILY HISTORY:   Family History  Problem Relation Age of Onset  . Diabetes Father   . Osteoporosis Mother   . Cancer Maternal Grandmother        breast  . Osteoporosis Maternal Grandmother     DRUG ALLERGIES:  Allergies  Allergen Reactions  . Depakote [Divalproex Sodium] Hives  . Morphine Hives  . Propoxyphene Other (See Comments)  . Tramadol Nausea Only  . Tramadol Nausea And Vomiting and Other (See Comments)  . Darvocet [Propoxyphene N-Acetaminophen] Rash  . Valproic Acid Rash    REVIEW OF SYSTEMS:  Review of Systems  Constitutional: Negative for chills, fever and weight loss.  HENT: Negative for ear discharge, ear pain and nosebleeds.   Eyes: Negative for blurred vision, pain and discharge.  Respiratory: Negative for sputum production, shortness of breath, wheezing and stridor.   Cardiovascular: Negative for chest pain, palpitations,  orthopnea and PND.  Gastrointestinal: Positive for abdominal pain, nausea and vomiting. Negative for diarrhea.  Genitourinary: Negative for frequency and urgency.  Musculoskeletal: Negative for back pain and joint pain.  Neurological: Negative for sensory change, speech change, focal weakness and weakness.  Psychiatric/Behavioral: Negative for depression and hallucinations. The patient is not nervous/anxious.      MEDICATIONS AT HOME:   Prior to Admission medications   Medication Sig Start Date End Date Taking? Authorizing Provider  ARIPiprazole (ABILIFY) 5 MG tablet Take 5 mg by mouth at bedtime. 03/09/18  Yes [provider]  budesonide-formoterol (SYMBICORT) 80-4.5 MCG/ACT inhaler Inhale 2 puffs into the lungs 2 (two) times daily. 06/21/18  Yes Lada, Satira Anis, MD  fexofenadine (ALLEGRA) 180 MG tablet Take 180 mg by mouth daily.   Yes [provider]  levothyroxine (SYNTHROID, LEVOTHROID) 75 MCG tablet Take 1 tablet (75 mcg total) by mouth daily before breakfast. 03/15/18  Yes Lada, Satira Anis, MD  lisdexamfetamine (VYVANSE) 30 MG capsule Take 30 mg by mouth daily.   Yes [provider]  montelukast (SINGULAIR) 10 MG tablet TAKE 1 TABLET BY MOUTH EVERY DAY 07/14/18  Yes Lada, Satira Anis, MD  ondansetron (ZOFRAN) 4 MG tablet Take 1 tablet (4 mg total) by mouth every 8 (eight) hours as needed for nausea or vomiting. 08/19/18  Yes Lisa Roca, MD  PROAIR HFA 108 [phone] Base) MCG/ACT inhaler INHALE 2 PUFFS BY MOUTH EVERY 6 HOURS IFNEEDED FOR WHEEZING OR SHORTNESS OF BREATH. Patient taking differently: Inhale 2 puffs into the lungs every 6 (six) hours as needed for wheezing or shortness of breath.  02/23/18  Yes Poulose, Bethel Born, NP  QUEtiapine (SEROQUEL) 100 MG tablet Take 100 mg by mouth at bedtime.  03/09/18  Yes [provider]  famotidine (PEPCID) 20 MG tablet Take 1 tablet (20 mg total) by mouth 2 (two) times daily for 7 days. Patient not taking: Reported on  08/20/2018 07/20/18 07/27/18  Hubbard Hartshorn, FNP  potassium chloride (K-DUR) 10 MEQ tablet Take 1 tablet (10 mEq total) by mouth 2 (two) times daily for 5 days. Patient not taking: Reported on 08/20/2018 03/08/18 03/13/18  Laban Emperor, PA-C  triamcinolone cream (KENALOG) 0.1 % Apply 1 application topically 3 (three) times daily. If needed; too strong for face, groin, underarms, or breasts Patient not taking: Reported on 08/20/2018 07/20/18   Hubbard Hartshorn, FNP      VITAL SIGNS:  Blood pressure 94/65, pulse 68, temperature (!) 97.5 F (36.4 C), temperature source Oral, height 5\' 8"  (1.727 m), weight 94.3 kg, last menstrual period 10/30/2014, SpO2 99 %.  PHYSICAL EXAMINATION:  GENERAL:  37 y.o.-year-old patient lying in the bed with no acute distress. obese EYES: Pupils equal, round, reactive to light and accommodation. No scleral icterus. Extraocular muscles intact.  HEENT: Head atraumatic, normocephalic. Oropharynx and nasopharynx clear.  NECK:  Supple,  no jugular venous distention. No thyroid enlargement, no tenderness.  LUNGS: Normal breath sounds bilaterally, no wheezing, rales,rhonchi or crepitation. No use of accessory muscles of respiration.  CARDIOVASCULAR: S1, S2 normal. No murmurs, rubs, or gallops.  ABDOMEN: Soft, nontender, nondistended. Bowel sounds present. No organomegaly or mass.  EXTREMITIES: No pedal edema, cyanosis, or clubbing.  NEUROLOGIC: Cranial nerves II through XII are intact. Muscle strength 5/5 in all extremities. Sensation intact. Gait not checked.  PSYCHIATRIC: The patient is alert and oriented x 3.  SKIN: No obvious rash, lesion, or ulcer.   LABORATORY PANEL:   CBC Recent Labs  Lab 08/20/18 0831  WBC 6.6  HGB 12.6  HCT 37.6  PLT 244   ------------------------------------------------------------------------------------------------------------------  Chemistries  Recent Labs  Lab 08/20/18 0831  NA 144  K 3.8  CL 110  CO2 24  GLUCOSE 88  BUN 10   CREATININE 0.84  CALCIUM 8.2*  AST 18  ALT 8  ALKPHOS 51  BILITOT 0.6   ------------------------------------------------------------------------------------------------------------------  Cardiac Enzymes No results for input(s): TROPONINI in the last 168 hours. ------------------------------------------------------------------------------------------------------------------  RADIOLOGY:  Dg Chest 2 View  Result Date: 08/20/2018 CLINICAL DATA:  History of smoking EXAM: CHEST - 2 VIEW COMPARISON:  03/08/2018 FINDINGS: The heart size and mediastinal contours are within normal limits. Both lungs are clear. The visualized skeletal structures are unremarkable. IMPRESSION: No active cardiopulmonary disease. Electronically Signed   By: Inez Catalina M.D.   On: 08/20/2018 09:33   US Abdomen Limited Ruq  Result Date: 08/20/2018 CLINICAL DATA:  37 year old female with a history of right upper quadrant pain for 3 days EXAM: ULTRASOUND ABDOMEN LIMITED RIGHT UPPER QUADRANT COMPARISON:  CT 07/30/2016 FINDINGS: Gallbladder: No gallstones or wall thickening visualized. No sonographic Murphy sign noted by sonographer. Common bile duct: Diameter: 9 mm-10 mm Liver: No focal lesion identified. Within normal limits in parenchymal echogenicity. Portal vein is patent on color Doppler imaging with normal direction of blood flow towards the liver. IMPRESSION: Sonographic survey negative for cholelithiasis, and negative for evidence of acute cholecystitis. The common bile duct measures 9 mm-10 mm within the hilum of the liver, which is larger than the comparison CT of 07/24/2016, and larger than expected in a patient of this age. If there is ongoing concern for acute biliary obstruction, correlation with either CT or MRCP/ERCP may be considered. Electronically Signed   By: Corrie Mckusick D.O.   On: 08/20/2018 09:26    EKG:    IMPRESSION AND PLAN:   Tarini Carrier  is a 37 y.o. female with a known history of  bipolar disorder, anxiety, ADHD comes to the emergency room with epigastric pain and right upper quadrant pain for last two days with nausea vomiting and unable to keep anything orally.  1. acute pancreatitis suspected gallbladder induced -admit patient to MedSurg floor -NPO, IV fluids, IV PO and PRN pain meds -G.I. consultation placed -lipase 250 -ultrasound gallbladder showedSonographic survey negative for cholelithiasis, and negative forevidence of acute cholecystitis. The common bile duct measures 9 mm-10 mm within the hilum of the liver, which is larger than the comparison CT of 07/24/2016 -IV famotidine BID  2. intractable nausea vomiting due to number one -IV fluids and PRN antiemetics  3. Bipolar disorder -continue home meds  4. DVT prophylaxis ambulatory/SCD avoiding antiplatelet agents in case patient needs G.I. Procedure  no family in the ER discussed with patient agrees with the plan  All the records are reviewed and case discussed with ED provider. Management plans discussed  with the patient, family and they are in agreement.  CODE STATUS: full  TOTAL TIME TAKING CARE OF THIS PATIENT: *45* minutes.    Fritzi Mandes M.D on 08/20/2018 at 10:13 AM  Between 7am to 6pm - Pager - [phone]  After 6pm go to [email or web address] - password [email or web address] [facility]  [facility]  Office  [phone]  CC: Primary care physician; Arnetha Courser, MD

## 2018-08-20 NOTE — Consult Note (Signed)
Renee Lame, MD [facility]  [address], [place], [place] [address] Phone: [phone] Fax : [phone]  Consultation  Referring Provider:     Dr. Posey Pronto Primary Care Physician:  Arnetha Courser, MD Primary Gastroenterologist:  Althia Forts         Reason for Consultation:     Abdominal pain  Date of Admission:  08/20/2018 Date of Consultation:  08/20/2018         HPI:   Renee Benjamin is a 37 y.o. female who is admitted with nausea vomiting diarrhea.  The patient states she had recently eaten out at a [place] and thinks she may have gotten sick from that. During the workup on admission the patient was found to have a elevated lipase and only slightly elevated 250 with a normal CT scan.  The patient reports her abdominal pain to be worse when she stands up and had a when she lays down.  It is exacerbated with movement.  The patient also states she had nausea and vomiting prior to admission.  She states that she is also very hungry and would like to eat. The patient reports that she was having watery nonbloody diarrhea for a few days prior to being admitted.  She denies fevers chills black stools or bloody stools.  The patient does have a history of pancreatitis many years ago and reported to have been from trauma.  Past Medical History:  Diagnosis Date  . ADHD (attention deficit hyperactivity disorder) 01/09/2016  . Allergy   . Anemia   . Anxiety   . Anxiety    separation anxiety  . Asthma   . Chronic right hip pain 04/10/2016  . Collagen vascular disease ([place])   . Endometriosis   . Fibromyalgia 01/09/2016  . Headache    otc med prn  . Hypothyroidism   . Plantar fasciitis of left foot 08.29.14   PLANTAR HEEL ,FLUID AROUND MEDIAL BAND  . Polycystic ovarian disease   . Polysubstance abuse ([place]) 01/22/2018   ER visit March 2019: Drug screen is positive for marijuana, cocaine, benzodiazepines, barbiturates, and opiates  . Stress fracture 08.15.14   RIGHT   .  Thoracic back pain   . Thyroid disease   . Vitamin D deficiency disease     Past Surgical History:  Procedure Laterality Date  . ABDOMINAL HYSTERECTOMY  March 2016   Due to uterus being stiched into C Section incision  . APPENDECTOMY    . APPENDECTOMY  2008  . CESAREAN SECTION    . CESAREAN SECTION  2014  . LAPAROSCOPIC BILATERAL SALPINGECTOMY Bilateral 01/02/2015   Procedure: LAPAROSCOPIC BILATERAL SALPINGECTOMY;  Surgeon: Osborne Oman, MD;  Location: [facility];  Service: Gynecology;  Laterality: Bilateral;  . LAPAROSCOPIC HYSTERECTOMY N/A 01/02/2015   Procedure: HYSTERECTOMY TOTAL LAPAROSCOPIC;  Surgeon: Osborne Oman, MD;  Location: [facility];  Service: Gynecology;  Laterality: N/A;  . SHOULDER SURGERY Right   . SHOULDER SURGERY  2001   right    Prior to Admission medications   Medication Sig Start Date End Date Taking? Authorizing Provider  ARIPiprazole (ABILIFY) 5 MG tablet Take 5 mg by mouth at bedtime. 03/09/18  Yes [provider]  budesonide-formoterol (SYMBICORT) 80-4.5 MCG/ACT inhaler Inhale 2 puffs into the lungs 2 (two) times daily. 06/21/18  Yes Lada, Satira Anis, MD  fexofenadine (ALLEGRA) 180 MG tablet Take 180 mg by mouth daily.   Yes [provider]  levothyroxine (SYNTHROID, LEVOTHROID) 75 MCG tablet Take 1  tablet (75 mcg total) by mouth daily before breakfast. 03/15/18  Yes Lada, Satira Anis, MD  lisdexamfetamine (VYVANSE) 30 MG capsule Take 30 mg by mouth daily.   Yes [provider]  montelukast (SINGULAIR) 10 MG tablet TAKE 1 TABLET BY MOUTH EVERY DAY 07/14/18  Yes Lada, Satira Anis, MD  ondansetron (ZOFRAN) 4 MG tablet Take 1 tablet (4 mg total) by mouth every 8 (eight) hours as needed for nausea or vomiting. 08/19/18  Yes Lisa Roca, MD  PROAIR HFA 108 [phone] Base) MCG/ACT inhaler INHALE 2 PUFFS BY MOUTH EVERY 6 HOURS IFNEEDED FOR WHEEZING OR SHORTNESS OF BREATH. Patient taking differently: Inhale 2 puffs into the lungs every 6 (six) hours as  needed for wheezing or shortness of breath.  02/23/18  Yes Poulose, Bethel Born, NP  QUEtiapine (SEROQUEL) 100 MG tablet Take 100 mg by mouth at bedtime.  03/09/18  Yes [provider]  famotidine (PEPCID) 20 MG tablet Take 1 tablet (20 mg total) by mouth 2 (two) times daily for 7 days. Patient not taking: Reported on 08/20/2018 07/20/18 07/27/18  Hubbard Hartshorn, FNP  potassium chloride (K-DUR) 10 MEQ tablet Take 1 tablet (10 mEq total) by mouth 2 (two) times daily for 5 days. Patient not taking: Reported on 08/20/2018 03/08/18 03/13/18  Laban Emperor, PA-C  triamcinolone cream (KENALOG) 0.1 % Apply 1 application topically 3 (three) times daily. If needed; too strong for face, groin, underarms, or breasts Patient not taking: Reported on 08/20/2018 07/20/18   Hubbard Hartshorn, FNP    Family History  Problem Relation Age of Onset  . Diabetes Father   . Osteoporosis Mother   . Cancer Maternal Grandmother        breast  . Osteoporosis Maternal Grandmother      Social History   Tobacco Use  . Smoking status: Former Smoker    Packs/day: 0.25    Years: 4.00    Pack years: 1.00    Types: Cigarettes    Last attempt to quit: 04/17/2012    Years since quitting: 6.3  . Smokeless tobacco: Never Used  Substance Use Topics  . Alcohol use: No    Alcohol/week: 0.0 standard drinks  . Drug use: No    Allergies as of 08/20/2018 - Review Complete 08/20/2018  Allergen Reaction Noted  . Depakote [divalproex sodium] Hives 03/14/2014  . Morphine Hives 04/09/2015  . Propoxyphene Other (See Comments) 08/20/2015  . Tramadol Nausea Only 08/02/2013  . Tramadol Nausea And Vomiting and Other (See Comments) 03/28/2014  . Darvocet [propoxyphene n-acetaminophen] Rash 08/02/2013  . Valproic acid Rash 01/09/2015    Review of Systems:    All systems reviewed and negative except where noted in HPI.   Physical Exam:  Vital signs in last 24 hours: Temp:  [97.5 F (36.4 C)-98 F (36.7 C)] 98 F (36.7 C)  (10/18 2139) Pulse Rate:  [50-68] 60 (10/18 2139) Resp:  [16-17] 17 (10/18 2139) BP: (87-119)/(51-73) 87/51 (10/18 2139) SpO2:  [96 %-100 %] 96 % (10/18 2139) Weight:  [94.3 kg] 94.3 kg (10/18 0811) Last BM Date: 08/19/18 General:   Pleasant, cooperative in NAD Head:  Normocephalic and atraumatic. Eyes:   No icterus.   Conjunctiva pink. PERRLA. Ears:  Normal auditory acuity. Neck:  Supple; no masses or thyroidomegaly Lungs: Respirations even and unlabored. Lungs clear to auscultation bilaterally.   No wheezes, crackles, or rhonchi.  Heart:  Regular rate and rhythm;  Without murmur, clicks, rubs or gallops Abdomen:  Soft, nondistended, Positive  tenderness to one finger palpation while flexing the abdominal wall muscles. Normal bowel sounds. No appreciable masses or hepatomegaly.  No rebound or guarding.  Rectal:  Not performed. Msk:  Symmetrical without gross deformities.    Extremities:  Without edema, cyanosis or clubbing. Neurologic:  Alert and oriented x3;  grossly normal neurologically. Skin:  Intact without significant lesions or rashes. Cervical Nodes:  No significant cervical adenopathy. Psych:  Alert and cooperative. Normal affect.  LAB RESULTS: Recent Labs    08/19/18 0939 08/20/18 0831  WBC 6.2 6.6  HGB 13.4 12.6  HCT 39.2 37.6  PLT 275 244   BMET Recent Labs    08/19/18 0939 08/20/18 0831  NA 143 144  K 3.5 3.8  CL 108 110  CO2 24 24  GLUCOSE 91 88  BUN 7 10  CREATININE 0.80 0.84  CALCIUM 8.7* 8.2*   LFT Recent Labs    08/20/18 0831  PROT 6.3*  ALBUMIN 3.5  AST 18  ALT 8  ALKPHOS 51  BILITOT 0.6   PT/INR No results for input(s): LABPROT, INR in the last 72 hours.  STUDIES: Dg Chest 2 View  Result Date: 08/20/2018 CLINICAL DATA:  History of smoking EXAM: CHEST - 2 VIEW COMPARISON:  03/08/2018 FINDINGS: The heart size and mediastinal contours are within normal limits. Both lungs are clear. The visualized skeletal structures are unremarkable.  IMPRESSION: No active cardiopulmonary disease. Electronically Signed   By: Inez Catalina M.D.   On: 08/20/2018 09:33   Mr Abdomen Mrcp Wo Contrast  Result Date: 08/20/2018 CLINICAL DATA:  Evaluate etiology of pancreatitis. Epigastric pain and right upper quadrant pain with nausea vomiting. Elevated lipase. EXAM: MRI ABDOMEN WITHOUT CONTRAST  (INCLUDING MRCP) TECHNIQUE: Multiplanar multisequence MR imaging of the abdomen was performed. Heavily T2-weighted images of the biliary and pancreatic ducts were obtained, and three-dimensional MRCP images were rendered by post processing. COMPARISON:  CT AP 07/24/2016 and abdominal sonogram 08/20/2017. FINDINGS: Lower chest: No acute findings. Hepatobiliary: No focal liver abnormality. The gallbladder appears within normal limits. No gallstones, gallbladder wall thickening or pericholecystic fluid. Mild increase caliber of the common bile duct which measures 8 mm. No choledocholithiasis. Pancreas: No significant pancreatic inflammation identified. No main duct dilatation or mass visualized. No complications of pancreatitis identified. Specifically no specific features to suggest necrosis or pseudocyst formation. Spleen:  Within normal limits in size and appearance. Adrenals/Urinary Tract: Normal appearance of the adrenal glands. The kidneys are unremarkable. Stomach/Bowel: Stomach appears normal. The small bowel loops have a normal course and caliber without obstruction. Vascular/Lymphatic: Aortic atherosclerosis. No aneurysm. No adenopathy identified. Other:  No free fluid or fluid collections. Musculoskeletal: No suspicious bone lesions identified. IMPRESSION: 1. No explanation for patient's pancreatitis. No gallstones or choledocholithiasis identified. Electronically Signed   By: Kerby Moors M.D.   On: 08/20/2018 12:27   US Abdomen Limited Ruq  Result Date: 08/20/2018 CLINICAL DATA:  37 year old female with a history of right upper quadrant pain for 3 days EXAM:  ULTRASOUND ABDOMEN LIMITED RIGHT UPPER QUADRANT COMPARISON:  CT 07/30/2016 FINDINGS: Gallbladder: No gallstones or wall thickening visualized. No sonographic Murphy sign noted by sonographer. Common bile duct: Diameter: 9 mm-10 mm Liver: No focal lesion identified. Within normal limits in parenchymal echogenicity. Portal vein is patent on color Doppler imaging with normal direction of blood flow towards the liver. IMPRESSION: Sonographic survey negative for cholelithiasis, and negative for evidence of acute cholecystitis. The common bile duct measures 9 mm-10 mm within the hilum of the liver,  which is larger than the comparison CT of 07/24/2016, and larger than expected in a patient of this age. If there is ongoing concern for acute biliary obstruction, correlation with either CT or MRCP/ERCP may be considered. Electronically Signed   By: Corrie Mckusick D.O.   On: 08/20/2018 09:26      Impression / Plan:   Assessment: Active Problems:   Pancreatitis   Renee Benjamin is a 37 y.o. y/o female with A history of back pain who now presents with nonbloody diarrhea with nausea and vomiting and musculoskeletal abdominal wall pain.  The patient's symptoms are reproduced by flexing abdominal wall muscles.  This is also in this patient with a history of back pain.  She also has a history of fibromyalgia and multiple other.  The patient does not meet the criteria for pancreatitis with noncardiac to wrist pain which is substernal/epigastric radiating to the right and not over the pancreas.  It is also better with rest of the abdominal wall muscles.  Her CT scan also did not show any signs of pancreatitis.   Plan:  I have explained to the patient that her pain is musculoskeletal and her nausea and vomiting with diarrhea is likely due to a gastrointestinal virus versus bacterial.  The patient would like to start eating food and she has been started on a diet to see how she tolerates this.  It is likely that the  patient's lipase will return to normal very quickly. No further workup is required from a GI point of view.  Thank you for involving me in the care of this patient.      LOS: 0 days   Renee Lame, MD  08/20/2018, 9:55 PM    Note: This dictation was prepared with Dragon dictation along with smaller phrase technology. Any transcriptional errors that result from this process are unintentional.

## 2018-08-20 NOTE — ED Provider Notes (Addendum)
[facility] Emergency Department Provider Note    First MD Initiated Contact with Patient 08/20/18 [phone]     (approximate)  I have reviewed the triage vital signs and the nursing notes.   HISTORY  Chief Complaint Abdominal Pain    HPI Renee Benjamin is a 37 y.o. female presents the ER for evaluation of persistent right upper quadrant abdominal pain associate with nausea and vomiting and mild headache.  Patient was seen in the ER yesterday for similar symptoms.  Blood work was reassuring and was discharged home for conservative management.  States that since going home she has not been able to keep anything down but is having worsening right upper quadrant pain.  Does have a history of gallstones pancreatitis.  Denies any fevers.  No chest pain or shortness of breath.  No cough.    Past Medical History:  Diagnosis Date  . ADHD (attention deficit hyperactivity disorder) 01/09/2016  . Allergy   . Anemia   . Anxiety   . Anxiety    separation anxiety  . Asthma   . Chronic right hip pain 04/10/2016  . Collagen vascular disease ([place])   . Endometriosis   . Fibromyalgia 01/09/2016  . Headache    otc med prn  . Hypothyroidism   . Plantar fasciitis of left foot 08.29.14   PLANTAR HEEL ,FLUID AROUND MEDIAL BAND  . Polycystic ovarian disease   . Polysubstance abuse ([place]) 01/22/2018   ER visit March 2019: Drug screen is positive for marijuana, cocaine, benzodiazepines, barbiturates, and opiates  . Stress fracture 08.15.14   RIGHT   . Thoracic back pain   . Thyroid disease   . Vitamin D deficiency disease    Family History  Problem Relation Age of Onset  . Diabetes Father   . Osteoporosis Mother   . Cancer Maternal Grandmother        breast  . Osteoporosis Maternal Grandmother    Past Surgical History:  Procedure Laterality Date  . ABDOMINAL HYSTERECTOMY  March 2016   Due to uterus being stiched into C Section incision  . APPENDECTOMY    .  APPENDECTOMY  2008  . CESAREAN SECTION    . CESAREAN SECTION  2014  . LAPAROSCOPIC BILATERAL SALPINGECTOMY Bilateral 01/02/2015   Procedure: LAPAROSCOPIC BILATERAL SALPINGECTOMY;  Surgeon: Osborne Oman, MD;  Location: [facility];  Service: Gynecology;  Laterality: Bilateral;  . LAPAROSCOPIC HYSTERECTOMY N/A 01/02/2015   Procedure: HYSTERECTOMY TOTAL LAPAROSCOPIC;  Surgeon: Osborne Oman, MD;  Location: [facility];  Service: Gynecology;  Laterality: N/A;  . SHOULDER SURGERY Right   . SHOULDER SURGERY  2001   right   Patient Active Problem List   Diagnosis Date Noted  . Pancreatitis 08/20/2018  . Right upper quadrant abdominal pain   . Polysubstance abuse ([place]) 01/22/2018  . 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 Cocaine abuse ([place]) 01/21/2018  . Cannabis abuse 01/21/2018  . Benzodiazepine abuse ([place]) 01/21/2018  . Barbiturate abuse ([place]) 01/21/2018  . Opiate abuse, episodic ([place]) 01/21/2018  . Allergic rhinitis 02/24/2017  . Vitamin B12 deficiency 02/24/2017  . Chronic right hip pain 04/10/2016  . Controlled substance agreement signed 02/01/2016  . Obesity 02/01/2016  . ADHD (attention deficit hyperactivity disorder) 01/09/2016  . Fibromyalgia 01/09/2016  . Torticollis, acute 10/08/2015  . Headache 10/02/2015  . Thoracic back pain 10/02/2015  . Lumbar pain 10/02/2015  . Arthralgia 10/02/2015  . Chronic tonsillitis 08/20/2015  . Migraine headache with aura 04/10/2015  .  Hypothyroidism 04/10/2015  . PTSD (post-traumatic stress disorder) 04/10/2015  . Status post laparoscopic hysterectomy 01/03/2015  . Chronic female pelvic pain 11/23/2014  . Plantar fasciitis of left foot   . Stress fracture       Prior to Admission medications   Medication Sig Start Date End Date Taking? Authorizing Provider  ARIPiprazole (ABILIFY) 5 MG tablet Take 5 mg by mouth at bedtime. 03/09/18  Yes [provider]  budesonide-formoterol (SYMBICORT) 80-4.5 MCG/ACT inhaler Inhale  2 puffs into the lungs 2 (two) times daily. 06/21/18  Yes Lada, Satira Anis, MD  fexofenadine (ALLEGRA) 180 MG tablet Take 180 mg by mouth daily.   Yes [provider]  levothyroxine (SYNTHROID, LEVOTHROID) 75 MCG tablet Take 1 tablet (75 mcg total) by mouth daily before breakfast. 03/15/18  Yes Lada, Satira Anis, MD  lisdexamfetamine (VYVANSE) 30 MG capsule Take 30 mg by mouth daily.   Yes [provider]  montelukast (SINGULAIR) 10 MG tablet TAKE 1 TABLET BY MOUTH EVERY DAY 07/14/18  Yes Lada, Satira Anis, MD  ondansetron (ZOFRAN) 4 MG tablet Take 1 tablet (4 mg total) by mouth every 8 (eight) hours as needed for nausea or vomiting. 08/19/18  Yes Lisa Roca, MD  PROAIR HFA 108 [phone] Base) MCG/ACT inhaler INHALE 2 PUFFS BY MOUTH EVERY 6 HOURS IFNEEDED FOR WHEEZING OR SHORTNESS OF BREATH. Patient taking differently: Inhale 2 puffs into the lungs every 6 (six) hours as needed for wheezing or shortness of breath.  02/23/18  Yes Poulose, Bethel Born, NP  QUEtiapine (SEROQUEL) 100 MG tablet Take 100 mg by mouth at bedtime.  03/09/18  Yes [provider]  pantoprazole (PROTONIX) 40 MG tablet Take 1 tablet (40 mg total) by mouth daily. 08/21/18 10/20/18  Henreitta Leber, MD    Allergies Depakote [divalproex sodium]; Morphine; Propoxyphene; Tramadol; Tramadol; Darvocet [propoxyphene n-acetaminophen]; and Valproic acid    Social History Social History   Tobacco Use  . Smoking status: Former Smoker    Packs/day: 0.25    Years: 4.00    Pack years: 1.00    Types: Cigarettes    Last attempt to quit: 04/17/2012    Years since quitting: 6.3  . Smokeless tobacco: Never Used  Substance Use Topics  . Alcohol use: No    Alcohol/week: 0.0 standard drinks  . Drug use: No    Review of Systems Patient denies headaches, rhinorrhea, blurry vision, numbness, shortness of breath, chest pain, edema, cough, abdominal pain, nausea, vomiting, diarrhea, dysuria, fevers, rashes or hallucinations  unless otherwise stated above in HPI. ____________________________________________   PHYSICAL EXAM:  VITAL SIGNS: Vitals:   08/20/18 2139 08/21/18 0616  BP: (!) 87/51 (!) 83/55  Pulse: 60 61  Resp: 17 16  Temp: 98 F (36.7 C) 97.8 F (36.6 C)  SpO2: 96% 99%    Constitutional: Alert and oriented.  Eyes: Conjunctivae are normal.  Head: Atraumatic. Nose: No congestion/rhinnorhea. Mouth/Throat: Mucous membranes are moist.   Neck: No stridor. Painless ROM.  Cardiovascular: Normal rate, regular rhythm. Grossly normal heart sounds.  Good peripheral circulation. Respiratory: Normal respiratory effort.  No retractions. Lungs CTAB. Gastrointestinal: Soft with mild ruq ttp, no rebound or guarding. No distention. No abdominal bruits. No CVA tenderness. Genitourinary:  Musculoskeletal: No lower extremity tenderness nor edema.  No joint effusions. Neurologic:  Normal speech and language. No gross focal neurologic deficits are appreciated. No facial droop Skin:  Skin is warm, dry and intact. No rash noted. Psychiatric: Mood and affect are normal. Speech and  behavior are normal.  ____________________________________________   LABS (all labs ordered are listed, but only abnormal results are displayed)  No results found for this or any previous visit (from the past 24 hour(s)). ____________________________________________ ___________ ED ECG REPORT I, Merlyn Lot, the attending physician, personally viewed and interpreted this ECG.   Date: 08/20/2018  EKG Time: 9:35  Rate: 75  Rhythm: sinus  Axis: normal  Intervals:normal  ST&T Change: no stemi _________________________________  RADIOLOGY  I personally reviewed all radiographic images ordered to evaluate for the above acute complaints and reviewed radiology reports and findings.  These findings were personally discussed with the patient.  Please see medical record for radiology  report.  ____________________________________________   PROCEDURES  Procedure(s) performed:  Procedures    Critical Care performed: no ____________________________________________   INITIAL IMPRESSION / ASSESSMENT AND PLAN / ED COURSE  Pertinent labs & imaging results that were available during my care of the patient were reviewed by me and considered in my medical decision making (see chart for details).   DDX: Cholelithiasis, pancreatitis, enteritis, gastritis, pneumonia, musculoskeletal strain  Renee Benjamin is a 37 y.o. who presents to the ED with symptoms as described above.  Repeat IV fluids as well as IV pain medication and blood work.  Will order ultrasound given worsening pain in her history.    Clinical Course as of Aug 26 1724  Fri Aug 20, 2018  3710 Patient now bumped her lipase and ultrasound shows evidence of dilatation of the common bile duct.  Possible gallstone pancreatitis.  Based on persistent worsening symptoms not tolerating p.o. patient will be admitted for bowel rest IV fluids and symptomatic management.   [PR]    Clinical Course User Index [PR] Merlyn Lot, MD     As part of my medical decision making, I reviewed the following data within the [place] notes reviewed and incorporated, Labs reviewed, notes from prior ED visits and [place] Controlled Substance Database   ____________________________________________   FINAL CLINICAL IMPRESSION(S) / ED DIAGNOSES  Final diagnoses:  RUQ pain  Acute pancreatitis, unspecified complication status, unspecified pancreatitis type      NEW MEDICATIONS STARTED DURING THIS VISIT:  Discharge Medication List as of 08/21/2018 11:42 AM    START taking these medications   Details  pantoprazole (PROTONIX) 40 MG tablet Take 1 tablet (40 mg total) by mouth daily., Starting Sat 08/21/2018, Until Wed 10/20/2018, Print         Note:  This document was prepared using Dragon  voice recognition software and may include unintentional dictation errors.    Merlyn Lot, MD 08/20/18 [id number]    Merlyn Lot, MD 08/26/18 1725

## 2018-08-20 NOTE — ED Notes (Signed)
Pt going to [place] then XRAY with tech.

## 2018-08-21 DIAGNOSIS — K529 Noninfective gastroenteritis and colitis, unspecified: Secondary | ICD-10-CM

## 2018-08-21 LAB — LIPASE, BLOOD: Lipase: 67 U/L — ABNORMAL HIGH (ref 11–51)

## 2018-08-21 LAB — COMPREHENSIVE METABOLIC PANEL
ALT: 7 U/L (ref 0–44)
AST: 13 U/L — ABNORMAL LOW (ref 15–41)
Albumin: 2.9 g/dL — ABNORMAL LOW (ref 3.5–5.0)
Alkaline Phosphatase: 39 U/L (ref 38–126)
Anion gap: 6 (ref 5–15)
BUN: 11 mg/dL (ref 6–20)
CO2: 23 mmol/L (ref 22–32)
Calcium: 7.6 mg/dL — ABNORMAL LOW (ref 8.9–10.3)
Chloride: 114 mmol/L — ABNORMAL HIGH (ref 98–111)
Creatinine, Ser: 0.75 mg/dL (ref 0.44–1.00)
GFR calc Af Amer: >60 mL/min (ref >60)
GFR calc non Af Amer: >60 mL/min (ref >60)
Glucose, Bld: 86 mg/dL (ref 70–99)
Potassium: 3.8 mmol/L (ref 3.5–5.1)
Sodium: 143 mmol/L (ref 135–145)
Total Bilirubin: 0.4 mg/dL (ref 0.3–1.2)
Total Protein: 5.2 g/dL — ABNORMAL LOW (ref 6.5–8.1)

## 2018-08-21 MED ORDER — PANTOPRAZOLE SODIUM 40 MG PO TBEC: 40.0000 mg | DELAYED_RELEASE_TABLET | Freq: Every day | ORAL | 1 refills | Status: DC

## 2018-08-21 NOTE — Progress Notes (Signed)
Vonda Antigua, MD [address] [place], [place], [place], [address] [address], [place], [place], [place], [address] Phone: [phone]  Fax: [phone]   Subjective:  Patient denies any further nausea vomiting or diarrhea.  Abdominal pain has improved.  Tolerating oral diet.  Objective: Exam: Vital signs in last 24 hours: Vitals:   08/20/18 1206 08/20/18 1229 08/20/18 2139 08/21/18 0616  BP: 103/62 119/73 (!) 87/51 (!) 83/55  Pulse: (!) 50 (!) 52 60 61  Resp:  16 17 16   Temp:  98 F (36.7 C) 98 F (36.7 C) 97.8 F (36.6 C)  TempSrc:  Oral Oral Oral  SpO2: 100% 99% 96% 99%  Weight:      Height:       Weight change:   Intake/Output Summary (Last 24 hours) at 08/21/2018 0948 Last data filed at 08/21/2018 0102 Gross per 24 hour  Intake 1763.55 ml  Output -  Net 1763.55 ml    General: No acute distress, AAO x3 Abd: Soft, NT/ND, No HSM Skin: Warm, no rashes Neck: Supple, Trachea midline   Lab Results: Lab Results  Component Value Date   WBC 6.6 08/20/2018   HGB 12.6 08/20/2018   HCT 37.6 08/20/2018   MCV 93.5 08/20/2018   PLT 244 08/20/2018   Micro Results: No results found for this or any previous visit (from the past 240 hour(s)). Studies/Results: Dg Chest 2 View  Result Date: 08/20/2018 CLINICAL DATA:  History of smoking EXAM: CHEST - 2 VIEW COMPARISON:  03/08/2018 FINDINGS: The heart size and mediastinal contours are within normal limits. Both lungs are clear. The visualized skeletal structures are unremarkable. IMPRESSION: No active cardiopulmonary disease. Electronically Signed   By: Inez Catalina M.D.   On: 08/20/2018 09:33   Mr Abdomen Mrcp Wo Contrast  Result Date: 08/20/2018 CLINICAL DATA:  Evaluate etiology of pancreatitis. Epigastric pain and right upper quadrant pain with nausea vomiting. Elevated lipase. EXAM: MRI ABDOMEN WITHOUT CONTRAST  (INCLUDING MRCP) TECHNIQUE: Multiplanar multisequence MR imaging of the abdomen was  performed. Heavily T2-weighted images of the biliary and pancreatic ducts were obtained, and three-dimensional MRCP images were rendered by post processing. COMPARISON:  CT AP 07/24/2016 and abdominal sonogram 08/20/2017. FINDINGS: Lower chest: No acute findings. Hepatobiliary: No focal liver abnormality. The gallbladder appears within normal limits. No gallstones, gallbladder wall thickening or pericholecystic fluid. Mild increase caliber of the common bile duct which measures 8 mm. No choledocholithiasis. Pancreas: No significant pancreatic inflammation identified. No main duct dilatation or mass visualized. No complications of pancreatitis identified. Specifically no specific features to suggest necrosis or pseudocyst formation. Spleen:  Within normal limits in size and appearance. Adrenals/Urinary Tract: Normal appearance of the adrenal glands. The kidneys are unremarkable. Stomach/Bowel: Stomach appears normal. The small bowel loops have a normal course and caliber without obstruction. Vascular/Lymphatic: Aortic atherosclerosis. No aneurysm. No adenopathy identified. Other:  No free fluid or fluid collections. Musculoskeletal: No suspicious bone lesions identified. IMPRESSION: 1. No explanation for patient's pancreatitis. No gallstones or choledocholithiasis identified. Electronically Signed   By: Kerby Moors M.D.   On: 08/20/2018 12:27   US Abdomen Limited Ruq  Result Date: 08/20/2018 CLINICAL DATA:  37 year old female with a history of right upper quadrant pain for 3 days EXAM: ULTRASOUND ABDOMEN LIMITED RIGHT UPPER QUADRANT COMPARISON:  CT 07/30/2016 FINDINGS: Gallbladder: No gallstones or wall thickening visualized. No sonographic Murphy sign noted by sonographer. Common bile duct: Diameter: 9 mm-10 mm Liver: No focal lesion identified. Within normal limits in parenchymal  echogenicity. Portal vein is patent on color Doppler imaging with normal direction of blood flow towards the liver. IMPRESSION:  Sonographic survey negative for cholelithiasis, and negative for evidence of acute cholecystitis. The common bile duct measures 9 mm-10 mm within the hilum of the liver, which is larger than the comparison CT of 07/24/2016, and larger than expected in a patient of this age. If there is ongoing concern for acute biliary obstruction, correlation with either CT or MRCP/ERCP may be considered. Electronically Signed   By: Corrie Mckusick D.O.   On: 08/20/2018 09:26   Medications:  Scheduled Meds: . ARIPiprazole  5 mg Oral QHS  . Influenza vac split quadrivalent PF  0.5 mL Intramuscular Tomorrow-1000  . levothyroxine  75 mcg Oral QAC breakfast  . lisdexamfetamine  30 mg Oral Daily  . loratadine  10 mg Oral Daily  . mometasone-formoterol  2 puff Inhalation BID  . montelukast  10 mg Oral Daily  . QUEtiapine  100 mg Oral QHS   Continuous Infusions: . sodium chloride 125 mL/hr at 08/21/18 0415   PRN Meds:.albuterol, ketorolac, ondansetron **OR** ondansetron (ZOFRAN) IV, oxyCODONE, polyethylene glycol, promethazine   Assessment: Active Problems:   Pancreatitis   Right upper quadrant abdominal pain    Plan: Abdominal pain significantly improved No further nausea vomiting or diarrhea Nausea vomiting or diarrhea was likely due to acute gastroenteritis that is resolving as expected Lipase is significantly decreased as well.  Elevation in lipase, most likely not due to pancreatitis, as no evidence of pancreatitis seen on imaging, and pain resolved with resolution of nausea vomiting diarrhea which is consistent with pain being from gastroenteritis itself.  Continue expectant management No elevation in liver enzymes  GI service will sign off Please page with any questions   LOS: 1 day   Vonda Antigua, MD 08/21/2018, 9:48 AM

## 2018-08-21 NOTE — Progress Notes (Signed)
Renee Benjamin was admitted to the Hospital on 08/20/2018 and Discharged  08/21/2018 and should be excused from work/school   for 5 days starting 08/20/2018 , may return to work/school without any restrictions.  Call Abel Presto MD, [facility]  [phone] with questions.  Henreitta Leber M.D on 08/21/2018,at 12:38 PM

## 2018-08-21 NOTE — Discharge Summary (Signed)
[facility] NAME: Renee Benjamin    MR#:  [id number]  [place]:  1981/07/02  DATE OF ADMISSION:  08/20/2018 ADMITTING PHYSICIAN: Fritzi Mandes, MD  DATE OF DISCHARGE: 08/21/2018  2:01 PM  PRIMARY CARE PHYSICIAN: Arnetha Courser, MD    ADMISSION DIAGNOSIS:  RUQ pain [R10.11] Acute pancreatitis, unspecified complication status, unspecified pancreatitis type [K85.90]  DISCHARGE DIAGNOSIS:  Active Problems:   Pancreatitis   Right upper quadrant abdominal pain   SECONDARY DIAGNOSIS:   Past Medical History:  Diagnosis Date  . ADHD (attention deficit hyperactivity disorder) 01/09/2016  . Allergy   . Anemia   . Anxiety   . Anxiety    separation anxiety  . Asthma   . Chronic right hip pain 04/10/2016  . Collagen vascular disease ([place])   . Endometriosis   . Fibromyalgia 01/09/2016  . Headache    otc med prn  . Hypothyroidism   . Plantar fasciitis of left foot 08.29.14   PLANTAR HEEL ,FLUID AROUND MEDIAL BAND  . Polycystic ovarian disease   . Polysubstance abuse ([place]) 01/22/2018   ER visit March 2019: Drug screen is positive for marijuana, cocaine, benzodiazepines, barbiturates, and opiates  . Stress fracture 08.15.14   RIGHT   . Thoracic back pain   . Thyroid disease   . Vitamin D deficiency disease     HOSPITAL COURSE:   37 year old female with past medical history of ADHD, fibromyalgia, history of endometriosis, anxiety, hypothyroidism, polycystic ovarian disease who presents to the hospital due to abdominal pain and nausea.  1.  Acute pancreatitis- this was the cause of patient's abdominal pain and nausea.  Patient presented to the hospital was noted to have an elevated lipase over 200.  She was treated supportively with IV fluids, antiemetics and pain control. - Gastroenterology consult was obtained, there was some concern of possible CBD stone and therefore patient underwent an MRCP which was essentially negative.   Patient's LFTs are stable her lipase has trended down, her diet has been slowly advanced from a clear to a soft diet which she is tolerating without any further abdominal pain nausea vomiting.  She is therefore being discharged home with outpatient GI follow-up.  2.  History of ADHD-patient will continue her Vyvanse.  3.  Hypothyroidism-patient will continue Synthroid.  4.  History of COPD-no acute exacerbation-patient will continue her Symbicort.  She will also continue albuterol inhaler as needed.   DISCHARGE CONDITIONS:   Stable  CONSULTS OBTAINED:    DRUG ALLERGIES:   Allergies  Allergen Reactions  . Depakote [Divalproex Sodium] Hives  . Morphine Hives  . Propoxyphene Other (See Comments)  . Tramadol Nausea Only  . Tramadol Nausea And Vomiting and Other (See Comments)  . Darvocet [Propoxyphene N-Acetaminophen] Rash  . Valproic Acid Rash    DISCHARGE MEDICATIONS:   Allergies as of 08/21/2018      Reactions   Depakote [divalproex Sodium] Hives   Morphine Hives   Propoxyphene Other (See Comments)   Tramadol Nausea Only   Tramadol Nausea And Vomiting, Other (See Comments)   Darvocet [propoxyphene N-acetaminophen] Rash   Valproic Acid Rash      Medication List    STOP taking these medications   famotidine 20 MG tablet Commonly known as:  PEPCID   potassium chloride 10 MEQ tablet Commonly known as:  K-DUR   triamcinolone cream 0.1 % Commonly known as:  KENALOG     TAKE these medications  ARIPiprazole 5 MG tablet Commonly known as:  ABILIFY Take 5 mg by mouth at bedtime.   budesonide-formoterol 80-4.5 MCG/ACT inhaler Commonly known as:  SYMBICORT Inhale 2 puffs into the lungs 2 (two) times daily.   fexofenadine 180 MG tablet Commonly known as:  ALLEGRA Take 180 mg by mouth daily.   levothyroxine 75 MCG tablet Commonly known as:  SYNTHROID, LEVOTHROID Take 1 tablet (75 mcg total) by mouth daily before breakfast.   lisdexamfetamine 30 MG  capsule Commonly known as:  VYVANSE Take 30 mg by mouth daily.   montelukast 10 MG tablet Commonly known as:  SINGULAIR TAKE 1 TABLET BY MOUTH EVERY DAY   ondansetron 4 MG tablet Commonly known as:  ZOFRAN Take 1 tablet (4 mg total) by mouth every 8 (eight) hours as needed for nausea or vomiting.   pantoprazole 40 MG tablet Commonly known as:  PROTONIX Take 1 tablet (40 mg total) by mouth daily.   PROAIR HFA 108 (90 Base) MCG/ACT inhaler Generic drug:  albuterol INHALE 2 PUFFS BY MOUTH EVERY 6 HOURS IFNEEDED FOR WHEEZING OR SHORTNESS OF BREATH. What changed:  See the new instructions.   QUEtiapine 100 MG tablet Commonly known as:  SEROQUEL Take 100 mg by mouth at bedtime.         DISCHARGE INSTRUCTIONS:   DIET:  Regular diet  DISCHARGE CONDITION:  Stable  ACTIVITY:  Activity as tolerated  OXYGEN:  Home Oxygen: No.   Oxygen Delivery: room air  DISCHARGE LOCATION:  home   If you experience worsening of your admission symptoms, develop shortness of breath, life threatening emergency, suicidal or homicidal thoughts you must seek medical attention immediately by calling 911 or calling your MD immediately  if symptoms less severe.  You Must read complete instructions/literature along with all the possible adverse reactions/side effects for all the Medicines you take and that have been prescribed to you. Take any new Medicines after you have completely understood and accpet all the possible adverse reactions/side effects.   Please note  You were cared for by a hospitalist during your hospital stay. If you have any questions about your discharge medications or the care you received while you were in the hospital after you are discharged, you can call the unit and asked to speak with the hospitalist on call if the hospitalist that took care of you is not available. Once you are discharged, your primary care physician will handle any further medical issues. Please note  that NO REFILLS for any discharge medications will be authorized once you are discharged, as it is imperative that you return to your primary care physician (or establish a relationship with a primary care physician if you do not have one) for your aftercare needs so that they can reassess your need for medications and monitor your lab values.     Today   Abdominal pain improved, no nausea or vomiting.  Lipase is trended down.  Patient is afebrile.  Tolerating a soft diet well.  Will discharge home with outpatient GI follow-up.  VITAL SIGNS:  Blood pressure (!) 83/55, pulse 61, temperature 97.8 F (36.6 C), temperature source Oral, resp. rate 16, height 5\' 8"  (1.727 m), weight 94.3 kg, last menstrual period 10/30/2014, SpO2 99 %.  I/O:    Intake/Output Summary (Last 24 hours) at 08/21/2018 1649 Last data filed at 08/21/2018 1037 Gross per 24 hour  Intake 1763.55 ml  Output 400 ml  Net 1363.55 ml    PHYSICAL EXAMINATION:  GENERAL:  37 y.o.-year-old patient lying in the bed with no acute distress.  EYES: Pupils equal, round, reactive to light and accommodation. No scleral icterus. Extraocular muscles intact.  HEENT: Head atraumatic, normocephalic. Oropharynx and nasopharynx clear.  NECK:  Supple, no jugular venous distention. No thyroid enlargement, no tenderness.  LUNGS: Normal breath sounds bilaterally, no wheezing, rales,rhonchi. No use of accessory muscles of respiration.  CARDIOVASCULAR: S1, S2 normal. No murmurs, rubs, or gallops.  ABDOMEN: Soft, non-tender, non-distended. Bowel sounds present. No organomegaly or mass.  EXTREMITIES: No pedal edema, cyanosis, or clubbing.  NEUROLOGIC: Cranial nerves II through XII are intact. No focal motor or sensory defecits b/l.  PSYCHIATRIC: The patient is alert and oriented x 3.  SKIN: No obvious rash, lesion, or ulcer.   DATA REVIEW:   CBC Recent Labs  Lab 08/20/18 0831  WBC 6.6  HGB 12.6  HCT 37.6  PLT 244    Chemistries   Recent Labs  Lab 08/21/18 0554  NA 143  K 3.8  CL 114*  CO2 23  GLUCOSE 86  BUN 11  CREATININE 0.75  CALCIUM 7.6*  AST 13*  ALT 7  ALKPHOS 39  BILITOT 0.4    Cardiac Enzymes No results for input(s): TROPONINI in the last 168 hours.  Microbiology Results  Results for orders placed or performed in visit on 08/20/15  Strep Gp A Ag, IA W/Reflex     Status: None   Collection Time: 08/20/15  9:46 AM  Result Value Ref Range Status   Strep A Culture Negative  Final    RADIOLOGY:  Dg Chest 2 View  Result Date: 08/20/2018 CLINICAL DATA:  History of smoking EXAM: CHEST - 2 VIEW COMPARISON:  03/08/2018 FINDINGS: The heart size and mediastinal contours are within normal limits. Both lungs are clear. The visualized skeletal structures are unremarkable. IMPRESSION: No active cardiopulmonary disease. Electronically Signed   By: Inez Catalina M.D.   On: 08/20/2018 09:33   Mr Abdomen Mrcp Wo Contrast  Result Date: 08/20/2018 CLINICAL DATA:  Evaluate etiology of pancreatitis. Epigastric pain and right upper quadrant pain with nausea vomiting. Elevated lipase. EXAM: MRI ABDOMEN WITHOUT CONTRAST  (INCLUDING MRCP) TECHNIQUE: Multiplanar multisequence MR imaging of the abdomen was performed. Heavily T2-weighted images of the biliary and pancreatic ducts were obtained, and three-dimensional MRCP images were rendered by post processing. COMPARISON:  CT AP 07/24/2016 and abdominal sonogram 08/20/2017. FINDINGS: Lower chest: No acute findings. Hepatobiliary: No focal liver abnormality. The gallbladder appears within normal limits. No gallstones, gallbladder wall thickening or pericholecystic fluid. Mild increase caliber of the common bile duct which measures 8 mm. No choledocholithiasis. Pancreas: No significant pancreatic inflammation identified. No main duct dilatation or mass visualized. No complications of pancreatitis identified. Specifically no specific features to suggest necrosis or pseudocyst  formation. Spleen:  Within normal limits in size and appearance. Adrenals/Urinary Tract: Normal appearance of the adrenal glands. The kidneys are unremarkable. Stomach/Bowel: Stomach appears normal. The small bowel loops have a normal course and caliber without obstruction. Vascular/Lymphatic: Aortic atherosclerosis. No aneurysm. No adenopathy identified. Other:  No free fluid or fluid collections. Musculoskeletal: No suspicious bone lesions identified. IMPRESSION: 1. No explanation for patient's pancreatitis. No gallstones or choledocholithiasis identified. Electronically Signed   By: Kerby Moors M.D.   On: 08/20/2018 12:27   US Abdomen Limited Ruq  Result Date: 08/20/2018 CLINICAL DATA:  37 year old female with a history of right upper quadrant pain for 3 days EXAM: ULTRASOUND ABDOMEN LIMITED RIGHT UPPER QUADRANT COMPARISON:  CT 07/30/2016  FINDINGS: Gallbladder: No gallstones or wall thickening visualized. No sonographic Murphy sign noted by sonographer. Common bile duct: Diameter: 9 mm-10 mm Liver: No focal lesion identified. Within normal limits in parenchymal echogenicity. Portal vein is patent on color Doppler imaging with normal direction of blood flow towards the liver. IMPRESSION: Sonographic survey negative for cholelithiasis, and negative for evidence of acute cholecystitis. The common bile duct measures 9 mm-10 mm within the hilum of the liver, which is larger than the comparison CT of 07/24/2016, and larger than expected in a patient of this age. If there is ongoing concern for acute biliary obstruction, correlation with either CT or MRCP/ERCP may be considered. Electronically Signed   By: Corrie Mckusick D.O.   On: 08/20/2018 09:26      Management plans discussed with the patient, family and they are in agreement.  CODE STATUS:     Code Status Orders  (From admission, onward)         Start     Ordered   08/20/18 1221  Full code  Continuous     08/20/18 1220        TOTAL TIME  TAKING CARE OF THIS PATIENT: 40 minutes.    Henreitta Leber M.D on 08/21/2018 at 4:49 PM  Between 7am to 6pm - Pager - [phone]  After 6pm go to [email or web address] - Proofreader  [facility]  Office  [phone]  CC: Primary care physician; Arnetha Courser, MD

## 2018-09-03 ENCOUNTER — Other Ambulatory Visit: Payer: Self-pay | Admitting: Family Medicine

## 2018-12-22 ENCOUNTER — Encounter: Payer: Self-pay | Admitting: Emergency Medicine

## 2018-12-22 ENCOUNTER — Emergency Department
Admission: EM | Admit: 2018-12-22 | Discharge: 2018-12-22 | Disposition: A | Discharge status: Home / Self Care | Payer: Medicaid Other | Attending: Emergency Medicine | Admitting: Emergency Medicine

## 2018-12-22 DIAGNOSIS — Z87891 Personal history of nicotine dependence: Secondary | ICD-10-CM | POA: Insufficient documentation

## 2018-12-22 DIAGNOSIS — J45909 Unspecified asthma, uncomplicated: Secondary | ICD-10-CM | POA: Insufficient documentation

## 2018-12-22 DIAGNOSIS — Z79899 Other long term (current) drug therapy: Secondary | ICD-10-CM | POA: Insufficient documentation

## 2018-12-22 DIAGNOSIS — R110 Nausea: Secondary | ICD-10-CM

## 2018-12-22 DIAGNOSIS — F419 Anxiety disorder, unspecified: Secondary | ICD-10-CM

## 2018-12-22 LAB — CBC
HCT: 40.1 % (ref 36.0–46.0)
Hemoglobin: 13.8 g/dL (ref 12.0–15.0)
MCH: 31.6 pg (ref 26.0–34.0)
MCHC: 34.4 g/dL (ref 30.0–36.0)
MCV: 91.8 fL (ref 80.0–100.0)
Platelets: 327 10*3/uL (ref 150–400)
RBC: 4.37 MIL/uL (ref 3.87–5.11)
RDW: 12.1 % (ref 11.5–15.5)
WBC: 8.4 10*3/uL (ref 4.0–10.5)
nRBC: 0 % (ref 0.0–0.2)

## 2018-12-22 LAB — COMPREHENSIVE METABOLIC PANEL
ALT: 11 U/L (ref 0–44)
AST: 19 U/L (ref 15–41)
Albumin: 4 g/dL (ref 3.5–5.0)
Alkaline Phosphatase: 59 U/L (ref 38–126)
Anion gap: 6 (ref 5–15)
BUN: 7 mg/dL (ref 6–20)
CO2: 26 mmol/L (ref 22–32)
Calcium: 9.1 mg/dL (ref 8.9–10.3)
Chloride: 108 mmol/L (ref 98–111)
Creatinine, Ser: 0.69 mg/dL (ref 0.44–1.00)
GFR calc Af Amer: >60 mL/min (ref >60)
GFR calc non Af Amer: >60 mL/min (ref >60)
Glucose, Bld: 99 mg/dL (ref 70–99)
Potassium: 3.4 mmol/L — ABNORMAL LOW (ref 3.5–5.1)
Sodium: 140 mmol/L (ref 135–145)
Total Bilirubin: 0.6 mg/dL (ref 0.3–1.2)
Total Protein: 7.1 g/dL (ref 6.5–8.1)

## 2018-12-22 LAB — LIPASE, BLOOD: Lipase: 31 U/L (ref 11–51)

## 2018-12-22 MED ORDER — HYDROXYZINE HCL 25 MG PO TABS
25.0000 mg | ORAL_TABLET | Freq: Once | ORAL | Status: DC
  Filled 2018-12-22 (×2): qty 1

## 2018-12-22 MED ORDER — DICYCLOMINE HCL 10 MG PO CAPS: 10.0000 mg | ORAL_CAPSULE | Freq: Three times a day (TID) | ORAL | 0 refills | Status: DC | PRN

## 2018-12-22 MED ORDER — HYDROXYZINE HCL 25 MG PO TABS: 25.0000 mg | ORAL_TABLET | Freq: Four times a day (QID) | ORAL | 0 refills | Status: DC | PRN

## 2018-12-22 MED ORDER — ONDANSETRON 8 MG PO TBDP: 8.0000 mg | ORAL_TABLET | Freq: Three times a day (TID) | ORAL | 0 refills | Status: DC | PRN

## 2018-12-22 MED ORDER — ONDANSETRON 4 MG PO TBDP
8.0000 mg | ORAL_TABLET | Freq: Once | ORAL | Status: AC
  Administered 2018-12-22: 8 mg via ORAL
  Filled 2018-12-22: qty 2

## 2018-12-22 NOTE — ED Triage Notes (Signed)
Pt states she has a lot of stress on her and wonders if she is not just having a nervous breakdown. Denies SI.

## 2018-12-22 NOTE — ED Provider Notes (Addendum)
[facility] Emergency Department Provider Note ____________________________________________   First MD Initiated Contact with Patient 12/22/18 1118     (approximate)  I have reviewed the triage vital signs and the nursing notes.   HISTORY  Chief Complaint Abdominal Pain; Nausea; and Emesis    HPI Renee Benjamin is a 38 y.o. female with PMH as noted below who presents with nausea and vomiting, gradual onset over the last week, occurring mainly in the morning and then resolving later in the day.  She states it is associated with anxiety over her pending separation from her husband.  She states that she has a decreased appetite.  She also reports some associated crampy abdominal pain.  She denies any diarrhea or change in her bowel movements.  The patient states she feels like she is "having a nervous breakdown" however on further clarification she has no SI or HI, and has no hallucinations, intrusive thoughts, or other acute psychiatric symptoms besides anxiety.  Past Medical History:  Diagnosis Date  . ADHD (attention deficit hyperactivity disorder) 01/09/2016  . Allergy   . Anemia   . Anxiety   . Anxiety    separation anxiety  . Asthma   . Chronic right hip pain 04/10/2016  . Collagen vascular disease ([place])   . Endometriosis   . Fibromyalgia 01/09/2016  . Headache    otc med prn  . Hypothyroidism   . Plantar fasciitis of left foot 08.29.14   PLANTAR HEEL ,FLUID AROUND MEDIAL BAND  . Polycystic ovarian disease   . Polysubstance abuse ([place]) 01/22/2018   ER visit March 2019: Drug screen is positive for marijuana, cocaine, benzodiazepines, barbiturates, and opiates  . Stress fracture 08.15.14   RIGHT   . Thoracic back pain   . Thyroid disease   . Vitamin D deficiency disease     Patient Active Problem List   Diagnosis Date Noted  . Pancreatitis 08/20/2018  . Right upper quadrant abdominal pain   . Polysubstance abuse ([place]) 01/22/2018  . Moderate  recurrent major depression ([place]) 01/21/2018  . Cocaine abuse ([place]) 01/21/2018  . Cannabis abuse 01/21/2018  . Benzodiazepine abuse ([place]) 01/21/2018  . Barbiturate abuse ([place]) 01/21/2018  . Opiate abuse, episodic ([place]) 01/21/2018  . Allergic rhinitis 02/24/2017  . Vitamin B12 deficiency 02/24/2017  . Chronic right hip pain 04/10/2016  . Controlled substance agreement signed 02/01/2016  . Obesity 02/01/2016  . ADHD (attention deficit hyperactivity disorder) 01/09/2016  . Fibromyalgia 01/09/2016  . Torticollis, acute 10/08/2015  . Headache 10/02/2015  . Thoracic back pain 10/02/2015  . Lumbar pain 10/02/2015  . Arthralgia 10/02/2015  . Chronic tonsillitis 08/20/2015  . Migraine headache with aura 04/10/2015  . Hypothyroidism 04/10/2015  . PTSD (post-traumatic stress disorder) 04/10/2015  . Status post laparoscopic hysterectomy 01/03/2015  . Chronic female pelvic pain 11/23/2014  . Plantar fasciitis of left foot   . Stress fracture     Past Surgical History:  Procedure Laterality Date  . ABDOMINAL HYSTERECTOMY  March 2016   Due to uterus being stiched into C Section incision  . APPENDECTOMY    . APPENDECTOMY  2008  . CESAREAN SECTION    . CESAREAN SECTION  2014  . LAPAROSCOPIC BILATERAL SALPINGECTOMY Bilateral 01/02/2015   Procedure: LAPAROSCOPIC BILATERAL SALPINGECTOMY;  Surgeon: Osborne Oman, MD;  Location: [facility];  Service: Gynecology;  Laterality: Bilateral;  . LAPAROSCOPIC HYSTERECTOMY N/A 01/02/2015   Procedure: HYSTERECTOMY TOTAL LAPAROSCOPIC;  Surgeon: Osborne Oman, MD;  Location: [facility];  Service: Gynecology;  Laterality: N/A;  . SHOULDER SURGERY Right   . SHOULDER SURGERY  2001   right    Prior to Admission medications   Medication Sig Start Date End Date Taking? Authorizing Provider  ARIPiprazole (ABILIFY) 5 MG tablet Take 5 mg by mouth at bedtime. 03/09/18   [provider]  budesonide-formoterol (SYMBICORT) 80-4.5 MCG/ACT inhaler Inhale 2 puffs into  the lungs 2 (two) times daily. 06/21/18   Arnetha Courser, MD  dicyclomine (BENTYL) 10 MG capsule Take 1 capsule (10 mg total) by mouth 3 (three) times daily as needed for up to 5 days (abdominal cramping). 12/22/18 12/27/18  Arta Silence, MD  fexofenadine (ALLEGRA) 180 MG tablet Take 180 mg by mouth daily.    [provider]  hydrOXYzine (ATARAX/VISTARIL) 25 MG tablet Take 1-2 tablets (25-50 mg total) by mouth 4 (four) times daily as needed for anxiety. 12/22/18   Arta Silence, MD  levothyroxine (SYNTHROID, LEVOTHROID) 75 MCG tablet TAKE 1 TABLET BY MOUTH ONCE DAILY ON AN EMPTY STOMACH. WAIT 30 MINUTES BEFORE TAKING OTHER MEDS. 09/03/18   Poulose, Bethel Born, NP  lisdexamfetamine (VYVANSE) 30 MG capsule Take 30 mg by mouth daily.    [provider]  montelukast (SINGULAIR) 10 MG tablet TAKE 1 TABLET BY MOUTH EVERY DAY 07/14/18   Lada, Satira Anis, MD  ondansetron (ZOFRAN ODT) 8 MG disintegrating tablet Take 1 tablet (8 mg total) by mouth every 8 (eight) hours as needed for nausea or vomiting. 12/22/18   Arta Silence, MD  ondansetron (ZOFRAN) 4 MG tablet Take 1 tablet (4 mg total) by mouth every 8 (eight) hours as needed for nausea or vomiting. 08/19/18   Lisa Roca, MD  pantoprazole (PROTONIX) 40 MG tablet Take 1 tablet (40 mg total) by mouth daily. 08/21/18 10/20/18  Henreitta Leber, MD  PROAIR HFA 108 (90 Base) MCG/ACT inhaler INHALE 2 PUFFS BY MOUTH EVERY 6 HOURS IFNEEDED FOR WHEEZING OR SHORTNESS OF BREATH. Patient taking differently: Inhale 2 puffs into the lungs every 6 (six) hours as needed for wheezing or shortness of breath.  02/23/18   Poulose, Bethel Born, NP  QUEtiapine (SEROQUEL) 100 MG tablet Take 100 mg by mouth at bedtime.  03/09/18   [provider]    Allergies Depakote [divalproex sodium]; Morphine; Propoxyphene; Tramadol; Tramadol; Darvocet [propoxyphene n-acetaminophen]; and Valproic acid  Family History  Problem Relation Age of Onset    . Diabetes Father   . Osteoporosis Mother   . Cancer Maternal Grandmother        breast  . Osteoporosis Maternal Grandmother     Social History Social History   Tobacco Use  . Smoking status: Former Smoker    Packs/day: 0.25    Years: 4.00    Pack years: 1.00    Types: Cigarettes    Last attempt to quit: 04/17/2012    Years since quitting: 6.6  . Smokeless tobacco: Never Used  Substance Use Topics  . Alcohol use: No    Alcohol/week: 0.0 standard drinks  . Drug use: No    Review of Systems  Constitutional: No fever. Eyes: No redness. ENT: No sore throat. Cardiovascular: Denies chest pain. Respiratory: Denies shortness of breath. Gastrointestinal: Positive for nausea and vomiting. Genitourinary: Negative for dysuria.  Musculoskeletal: Negative for back pain. Skin: Negative for rash. Neurological: Negative for headache.   ____________________________________________   PHYSICAL EXAM:  VITAL SIGNS: ED Triage Vitals  Enc Vitals Group     BP 12/22/18 0908 114/70  Pulse Rate 12/22/18 0908 72     Resp 12/22/18 0908 16     Temp 12/22/18 0908 98.5 F (36.9 C)     Temp Source 12/22/18 0908 Oral     SpO2 12/22/18 0908 99 %     Weight 12/22/18 0835 210 lb (95.3 kg)     Height 12/22/18 0835 5\' 8"  (1.727 m)     Head Circumference --      Peak Flow --      Pain Score 12/22/18 0835 5     Pain Loc --      Pain Edu? --      Excl. in [place]? --     Constitutional: Alert and oriented. Well appearing and in no acute distress. Eyes: Conjunctivae are normal.  Head: Atraumatic. Nose: No congestion/rhinnorhea. Mouth/Throat: Mucous membranes are moist.   Neck: Normal range of motion.  Cardiovascular: Good peripheral circulation. Respiratory: Normal respiratory effort.  No retractions.  Gastrointestinal: Soft and nontender. No distention.  Genitourinary: No flank tenderness. Musculoskeletal:  Extremities warm and well perfused.  Neurologic:  Normal speech and language.  No gross focal neurologic deficits are appreciated.  Skin:  Skin is warm and dry. No rash noted. Psychiatric: Mood and affect are normal. Speech and behavior are normal.  ____________________________________________   LABS (all labs ordered are listed, but only abnormal results are displayed)  Labs Reviewed  COMPREHENSIVE METABOLIC PANEL - Abnormal; Notable for the following components:      Result Value   Potassium 3.4 (*)    All other components within normal limits  LIPASE, BLOOD  CBC  URINALYSIS, COMPLETE (UACMP) WITH MICROSCOPIC   ____________________________________________  EKG   ____________________________________________  RADIOLOGY    ____________________________________________   PROCEDURES  Procedure(s) performed: No  Procedures  Critical Care performed: No ____________________________________________   INITIAL IMPRESSION / ASSESSMENT AND PLAN / ED COURSE  Pertinent labs & imaging results that were available during my care of the patient were reviewed by me and considered in my medical decision making (see chart for details).  38 year old female with PMH as noted above presents with nausea and vomiting in the mornings, associated with anxiety.  The patient states that she has been under stress because of a pending separation from her husband.  She reports crampy abdominal pain but denies diarrhea or fever.  She denies SI or HI or other acute psychiatric symptoms besides anxiety.  I reviewed the past medical records in [place].  The patient was admitted last year for an episode of pancreatitis.  She states that the symptoms today do not feel similar.  On exam the patient is well-appearing and her vital signs are normal.  The abdomen is soft and nontender.  The remainder of the exam is unremarkable.  The lab work-up is unremarkable as well.  Overall presentation is consistent with symptoms related to anxiety, versus possible gastritis or mild  gastroenteritis.  The patient reports that hydroxyzine has helped in the past and I think that this is reasonable.  She has no SI or HI, any evidence of danger to self or others, or indication for emergent psychiatric evaluation.  She has follow-up with her psychiatrist later this week.  I will prescribe hydroxyzine for anxiety, and Zofran and Bentyl for her symptoms.  Return precautions given, and she expressed understanding. ____________________________________________   FINAL CLINICAL IMPRESSION(S) / ED DIAGNOSES  Final diagnoses:  Anxiety  Nausea      NEW MEDICATIONS STARTED DURING THIS VISIT:  New Prescriptions   DICYCLOMINE (  BENTYL) 10 MG CAPSULE    Take 1 capsule (10 mg total) by mouth 3 (three) times daily as needed for up to 5 days (abdominal cramping).   HYDROXYZINE (ATARAX/VISTARIL) 25 MG TABLET    Take 1-2 tablets (25-50 mg total) by mouth 4 (four) times daily as needed for anxiety.   ONDANSETRON (ZOFRAN ODT) 8 MG DISINTEGRATING TABLET    Take 1 tablet (8 mg total) by mouth every 8 (eight) hours as needed for nausea or vomiting.     Note:  This document was prepared using Dragon voice recognition software and may include unintentional dictation errors.    Arta Silence, MD 12/22/18 1148    Arta Silence, MD 12/22/18 1148

## 2018-12-22 NOTE — ED Triage Notes (Signed)
Pt reports was sick about a week ago, got better and yesterday started with mid epigastric abd pain , NV again.

## 2018-12-22 NOTE — Discharge Instructions (Signed)
Low up with your psychiatrist on Thursday as planned.  Take the hydroxyzine as needed for anxiety, the Zofran as needed for nausea, and the Bentyl if you have abdominal cramping.  Return to the ER for new, worsening, persistent severe nausea or vomiting, abdominal pain, fever, worsening anxiety, or any thoughts of wanting to hurt yourself.

## 2019-01-02 ENCOUNTER — Encounter: Payer: Self-pay | Admitting: Emergency Medicine

## 2019-01-02 ENCOUNTER — Emergency Department
Admission: EM | Admit: 2019-01-02 | Discharge: 2019-01-02 | Disposition: A | Discharge status: Home / Self Care | Payer: Medicaid Other | Attending: Emergency Medicine | Admitting: Emergency Medicine

## 2019-01-02 DIAGNOSIS — Y999 Unspecified external cause status: Secondary | ICD-10-CM | POA: Insufficient documentation

## 2019-01-02 DIAGNOSIS — Z87891 Personal history of nicotine dependence: Secondary | ICD-10-CM | POA: Insufficient documentation

## 2019-01-02 DIAGNOSIS — J45909 Unspecified asthma, uncomplicated: Secondary | ICD-10-CM | POA: Insufficient documentation

## 2019-01-02 DIAGNOSIS — S61012A Laceration without foreign body of left thumb without damage to nail, initial encounter: Secondary | ICD-10-CM | POA: Insufficient documentation

## 2019-01-02 DIAGNOSIS — Y92009 Unspecified place in unspecified non-institutional (private) residence as the place of occurrence of the external cause: Secondary | ICD-10-CM | POA: Insufficient documentation

## 2019-01-02 DIAGNOSIS — E039 Hypothyroidism, unspecified: Secondary | ICD-10-CM | POA: Insufficient documentation

## 2019-01-02 DIAGNOSIS — W260XXA Contact with knife, initial encounter: Secondary | ICD-10-CM | POA: Insufficient documentation

## 2019-01-02 DIAGNOSIS — Y9389 Activity, other specified: Secondary | ICD-10-CM | POA: Insufficient documentation

## 2019-01-02 DIAGNOSIS — Z79899 Other long term (current) drug therapy: Secondary | ICD-10-CM | POA: Insufficient documentation

## 2019-01-02 MED ORDER — LIDOCAINE HCL (PF) 1 % IJ SOLN
5.0000 mL | Freq: Once | INTRAMUSCULAR | Status: AC
  Administered 2019-01-02: 5 mL

## 2019-01-02 MED ORDER — LIDOCAINE-EPINEPHRINE-TETRACAINE (LET) SOLUTION
3.0000 mL | Freq: Once | NASAL | Status: AC
  Administered 2019-01-02: 3 mL via TOPICAL
  Filled 2019-01-02: qty 3

## 2019-01-02 MED ORDER — LIDOCAINE HCL (PF) 1 % IJ SOLN
INTRAMUSCULAR | Status: AC
  Filled 2019-01-02: qty 5

## 2019-01-02 NOTE — ED Provider Notes (Signed)
[facility] Emergency Department Provider Note  ____________________________________________   First MD Initiated Contact with Patient 01/02/19 1934     (approximate)  I have reviewed the triage vital signs and the nursing notes.   HISTORY  Chief Complaint Laceration    HPI CIELLE AGUILA is a 38 y.o. female presents emergency department complaining of a laceration to the left thumb.  She was opening a package with a pocket knife and cut her finger.  She denies any loss of motion.  She states her tetanus is up-to-date.    Past Medical History:  Diagnosis Date  . ADHD (attention deficit hyperactivity disorder) 01/09/2016  . Allergy   . Anemia   . Anxiety   . Anxiety    separation anxiety  . Asthma   . Chronic right hip pain 04/10/2016  . Collagen vascular disease ([place])   . Endometriosis   . Fibromyalgia 01/09/2016  . Headache    otc med prn  . Hypothyroidism   . Plantar fasciitis of left foot 08.29.14   PLANTAR HEEL ,FLUID AROUND MEDIAL BAND  . Polycystic ovarian disease   . Polysubstance abuse ([place]) 01/22/2018   ER visit March 2019: Drug screen is positive for marijuana, cocaine, benzodiazepines, barbiturates, and opiates  . Stress fracture 08.15.14   RIGHT   . Thoracic back pain   . Thyroid disease   . Vitamin D deficiency disease     Patient Active Problem List   Diagnosis Date Noted  . Pancreatitis 08/20/2018  . Right upper quadrant abdominal pain   . Polysubstance abuse ([place]) 01/22/2018  . Moderate recurrent major depression ([place]) 01/21/2018  . Cocaine abuse ([place]) 01/21/2018  . Cannabis abuse 01/21/2018  . Benzodiazepine abuse ([place]) 01/21/2018  . 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 Opiate abuse, episodic ([place]) 01/21/2018  . Allergic rhinitis 02/24/2017  . Vitamin B12 deficiency 02/24/2017  . Chronic right hip pain 04/10/2016  . Controlled substance agreement signed 02/01/2016  . Obesity 02/01/2016  . ADHD (attention deficit  hyperactivity disorder) 01/09/2016  . Fibromyalgia 01/09/2016  . Torticollis, acute 10/08/2015  . Headache 10/02/2015  . Thoracic back pain 10/02/2015  . Lumbar pain 10/02/2015  . Arthralgia 10/02/2015  . Chronic tonsillitis 08/20/2015  . Migraine headache with aura 04/10/2015  . Hypothyroidism 04/10/2015  . PTSD (post-traumatic stress disorder) 04/10/2015  . Status post laparoscopic hysterectomy 01/03/2015  . Chronic female pelvic pain 11/23/2014  . Plantar fasciitis of left foot   . Stress fracture     Past Surgical History:  Procedure Laterality Date  . ABDOMINAL HYSTERECTOMY  March 2016   Due to uterus being stiched into C Section incision  . APPENDECTOMY    . APPENDECTOMY  2008  . CESAREAN SECTION    . CESAREAN SECTION  2014  . LAPAROSCOPIC BILATERAL SALPINGECTOMY Bilateral 01/02/2015   Procedure: LAPAROSCOPIC BILATERAL SALPINGECTOMY;  Surgeon: Osborne Oman, MD;  Location: [facility];  Service: Gynecology;  Laterality: Bilateral;  . LAPAROSCOPIC HYSTERECTOMY N/A 01/02/2015   Procedure: HYSTERECTOMY TOTAL LAPAROSCOPIC;  Surgeon: Osborne Oman, MD;  Location: [facility];  Service: Gynecology;  Laterality: N/A;  . SHOULDER SURGERY Right   . SHOULDER SURGERY  2001   right    Prior to Admission medications   Medication Sig Start Date End Date Taking? Authorizing Provider  ARIPiprazole (ABILIFY) 5 MG tablet Take 5 mg by mouth at bedtime. 03/09/18   [provider]  budesonide-formoterol (SYMBICORT) 80-4.5 MCG/ACT inhaler Inhale 2 puffs into the lungs 2 (  two) times daily. 06/21/18   Arnetha Courser, MD  dicyclomine (BENTYL) 10 MG capsule Take 1 capsule (10 mg total) by mouth 3 (three) times daily as needed for up to 5 days (abdominal cramping). 12/22/18 12/27/18  Arta Silence, MD  fexofenadine (ALLEGRA) 180 MG tablet Take 180 mg by mouth daily.    [provider]  hydrOXYzine (ATARAX/VISTARIL) 25 MG tablet Take 1-2 tablets (25-50 mg total) by mouth 4 (four) times  daily as needed for anxiety. 12/22/18   Arta Silence, MD  levothyroxine (SYNTHROID, LEVOTHROID) 75 MCG tablet TAKE 1 TABLET BY MOUTH ONCE DAILY ON AN EMPTY STOMACH. WAIT 30 MINUTES BEFORE TAKING OTHER MEDS. 09/03/18   Poulose, Bethel Born, NP  lisdexamfetamine (VYVANSE) 30 MG capsule Take 30 mg by mouth daily.    [provider]  montelukast (SINGULAIR) 10 MG tablet TAKE 1 TABLET BY MOUTH EVERY DAY 07/14/18   Lada, Satira Anis, MD  ondansetron (ZOFRAN ODT) 8 MG disintegrating tablet Take 1 tablet (8 mg total) by mouth every 8 (eight) hours as needed for nausea or vomiting. 12/22/18   Arta Silence, MD  ondansetron (ZOFRAN) 4 MG tablet Take 1 tablet (4 mg total) by mouth every 8 (eight) hours as needed for nausea or vomiting. 08/19/18   Lisa Roca, MD  pantoprazole (PROTONIX) 40 MG tablet Take 1 tablet (40 mg total) by mouth daily. 08/21/18 10/20/18  Henreitta Leber, MD  PROAIR HFA 108 (90 Base) MCG/ACT inhaler INHALE 2 PUFFS BY MOUTH EVERY 6 HOURS IFNEEDED FOR WHEEZING OR SHORTNESS OF BREATH. Patient taking differently: Inhale 2 puffs into the lungs every 6 (six) hours as needed for wheezing or shortness of breath.  02/23/18   Poulose, Bethel Born, NP  QUEtiapine (SEROQUEL) 100 MG tablet Take 100 mg by mouth at bedtime.  03/09/18   [provider]    Allergies Depakote [divalproex sodium]; Morphine; Propoxyphene; Tramadol; Tramadol; Darvocet [propoxyphene n-acetaminophen]; and Valproic acid  Family History  Problem Relation Age of Onset  . Diabetes Father   . Osteoporosis Mother   . Cancer Maternal Grandmother        breast  . Osteoporosis Maternal Grandmother     Social History Social History   Tobacco Use  . Smoking status: Former Smoker    Packs/day: 0.25    Years: 4.00    Pack years: 1.00    Types: Cigarettes    Last attempt to quit: 04/17/2012    Years since quitting: 6.7  . Smokeless tobacco: Never Used  Substance Use Topics  . Alcohol use: No     Alcohol/week: 0.0 standard drinks  . Drug use: No    Review of Systems  Constitutional: No fever/chills Eyes: No visual changes. ENT: No sore throat. Respiratory: Denies cough Genitourinary: Negative for dysuria. Musculoskeletal: Negative for back pain.  Positive left thumb laceration Skin: Negative for rash.  Positive for laceration    ____________________________________________   PHYSICAL EXAM:  VITAL SIGNS: ED Triage Vitals  Enc Vitals Group     BP 01/02/19 1915 120/76     Pulse Rate 01/02/19 1915 94     Resp 01/02/19 1915 16     Temp 01/02/19 1915 98.1 F (36.7 C)     Temp Source 01/02/19 1915 Oral     SpO2 01/02/19 1915 98 %     Weight 01/02/19 1916 210 lb (95.3 kg)     Height 01/02/19 1916 5\' 8"  (1.727 m)     Head Circumference --  Peak Flow --      Pain Score --      Pain Loc --      Pain Edu? --      Excl. in [place]? --     Constitutional: Alert and oriented. Well appearing and in no acute distress. Eyes: Conjunctivae are normal.  Head: Atraumatic. Nose: No congestion/rhinnorhea. Mouth/Throat: Mucous membranes are moist.   Neck:  supple no lymphadenopathy noted Cardiovascular: Normal rate, regular rhythm. Respiratory: Normal respiratory effort.  No retractions,  GU: deferred Musculoskeletal: FROM all extremities, warm and well perfused, no tenderness noted, neurovascular is intact, laceration is approximately 1.5 cm Neurologic:  Normal speech and language.  Skin:  Skin is warm, dry, positive laceration to the left thumb no rash noted. Psychiatric: Mood and affect are normal. Speech and behavior are normal.  ____________________________________________   LABS (all labs ordered are listed, but only abnormal results are displayed)  Labs Reviewed - No data to  display ____________________________________________   ____________________________________________  RADIOLOGY    ____________________________________________   PROCEDURES  Procedure(s) performed:   Marland KitchenMarland KitchenLaceration Repair Date/Time: 01/02/2019 8:16 PM Performed by: Versie Starks, PA-C Authorized by: Versie Starks, PA-C   Consent:    Consent obtained:  Verbal   Consent given by:  Patient   Risks discussed:  Pain, infection, tendon damage, poor wound healing, vascular damage and nerve damage   Alternatives discussed:  Referral Anesthesia (see MAR for exact dosages):    Anesthesia method:  Local infiltration   Local anesthetic:  Lidocaine 1% w/o epi Laceration details:    Location:  Finger   Finger location:  L thumb   Length (cm):  1.5   Depth (mm):  2 Repair type:    Repair type:  Simple Pre-procedure details:    Preparation:  Patient was prepped and draped in usual sterile fashion Exploration:    Hemostasis achieved with:  Direct pressure and LET   Wound exploration: wound explored through full range of motion     Wound extent: no fascia violation noted, no foreign bodies/material noted, no muscle damage noted, no nerve damage noted, no tendon damage noted, no underlying fracture noted and no vascular damage noted     Contaminated: no   Treatment:    Area cleansed with:  Betadine and saline   Amount of cleaning:  Standard   Irrigation solution:  Sterile saline   Irrigation method:  Syringe and tap   Visualized foreign bodies/material removed: no   Skin repair:    Repair method:  Sutures   Suture size:  5-0   Suture material:  Nylon   Suture technique:  Simple interrupted   Number of sutures:  4 Approximation:    Approximation:  Close Post-procedure details:    Dressing:  Non-adherent dressing   Patient tolerance of procedure:  Tolerated well, no immediate complications      ____________________________________________   INITIAL IMPRESSION /  ASSESSMENT AND PLAN / ED COURSE  Pertinent labs & imaging results that were available during my care of the patient were reviewed by me and considered in my medical decision making (see chart for details).   Patient is 38 year old female presents emergency department complaining of a laceration to the left thumb.  Tdap is up-to-date  Physical exam shows an approximately 1.5 cm laceration to the left thumb.  No foreign body or tendon damage is noted.  See procedure note for repair. Patient is to follow-up with her regular doctor, the ER, or remove the sutures herself  in 7 to 10 days.  Return here or see her regular doctor if any signs of infection.  She states she understands will comply.  She was discharged in stable condition.     As part of my medical decision making, I reviewed the following data within the [place] notes reviewed and incorporated, Old chart reviewed, Notes from prior ED visits and [place] Controlled Substance Database  ____________________________________________   FINAL CLINICAL IMPRESSION(S) / ED DIAGNOSES  Final diagnoses:  Laceration of left thumb without foreign body without damage to nail, initial encounter      NEW MEDICATIONS STARTED DURING THIS VISIT:  New Prescriptions   No medications on file     Note:  This document was prepared using Dragon voice recognition software and may include unintentional dictation errors.    Versie Starks, PA-C 01/02/19 2018    Earleen Newport, MD 01/02/19 2103

## 2019-01-02 NOTE — Discharge Instructions (Addendum)
Follow-up with your regular doctor in 7 to 10 days for suture removal.  Or you may return to the emergency department for suture removal.  Also if you feel comfortable taking the stitches out yourself you may do this.  For any signs of infection please return the emergency department or see your regular doctor.  Keep the areas clean and dry as possible.  If it becomes dirty wash gently with soap and water.

## 2019-01-02 NOTE — ED Triage Notes (Signed)
Pt reports she was opening package today with knife when the knife slipped and left a 1 inch laceration under the left thumb. Bledding controled, new bandage applied.

## 2019-02-24 ENCOUNTER — Ambulatory Visit: Payer: Medicaid Other | Admitting: Nurse Practitioner

## 2019-02-24 ENCOUNTER — Other Ambulatory Visit: Payer: Self-pay

## 2019-02-24 ENCOUNTER — Emergency Department
Admission: EM | Admit: 2019-02-24 | Discharge: 2019-02-24 | Disposition: A | Discharge status: Home / Self Care | Payer: Self-pay | Attending: Emergency Medicine | Admitting: Emergency Medicine

## 2019-02-24 ENCOUNTER — Encounter: Payer: Self-pay | Admitting: Emergency Medicine

## 2019-02-24 ENCOUNTER — Ambulatory Visit: Payer: Self-pay

## 2019-02-24 ENCOUNTER — Emergency Department
Admission: EM | Admit: 2019-02-24 | Discharge: 2019-02-24 | Disposition: A | Discharge status: Home / Self Care | Payer: HRSA Program | Attending: Emergency Medicine | Admitting: Emergency Medicine

## 2019-02-24 ENCOUNTER — Emergency Department: Payer: Self-pay

## 2019-02-24 DIAGNOSIS — B349 Viral infection, unspecified: Secondary | ICD-10-CM

## 2019-02-24 DIAGNOSIS — Z79899 Other long term (current) drug therapy: Secondary | ICD-10-CM | POA: Diagnosis not present

## 2019-02-24 DIAGNOSIS — E039 Hypothyroidism, unspecified: Secondary | ICD-10-CM | POA: Diagnosis not present

## 2019-02-24 DIAGNOSIS — R05 Cough: Secondary | ICD-10-CM | POA: Insufficient documentation

## 2019-02-24 DIAGNOSIS — Z87891 Personal history of nicotine dependence: Secondary | ICD-10-CM | POA: Insufficient documentation

## 2019-02-24 DIAGNOSIS — Z20828 Contact with and (suspected) exposure to other viral communicable diseases: Secondary | ICD-10-CM | POA: Diagnosis not present

## 2019-02-24 DIAGNOSIS — J069 Acute upper respiratory infection, unspecified: Secondary | ICD-10-CM | POA: Insufficient documentation

## 2019-02-24 DIAGNOSIS — B9789 Other viral agents as the cause of diseases classified elsewhere: Secondary | ICD-10-CM

## 2019-02-24 DIAGNOSIS — J45909 Unspecified asthma, uncomplicated: Secondary | ICD-10-CM | POA: Insufficient documentation

## 2019-02-24 DIAGNOSIS — F909 Attention-deficit hyperactivity disorder, unspecified type: Secondary | ICD-10-CM | POA: Insufficient documentation

## 2019-02-24 DIAGNOSIS — R059 Cough, unspecified: Secondary | ICD-10-CM

## 2019-02-24 LAB — CBC WITH DIFFERENTIAL/PLATELET
Abs Immature Granulocytes: 0.02 10*3/uL (ref 0.00–0.07)
Basophils Absolute: 0.1 10*3/uL (ref 0.0–0.1)
Basophils Relative: 1 %
Eosinophils Absolute: 0.2 10*3/uL (ref 0.0–0.5)
Eosinophils Relative: 2 %
HCT: 38.7 % (ref 36.0–46.0)
Hemoglobin: 13.5 g/dL (ref 12.0–15.0)
Immature Granulocytes: 0 %
Lymphocytes Relative: 23 %
Lymphs Abs: 2.1 10*3/uL (ref 0.7–4.0)
MCH: 31.5 pg (ref 26.0–34.0)
MCHC: 34.9 g/dL (ref 30.0–36.0)
MCV: 90.2 fL (ref 80.0–100.0)
Monocytes Absolute: 1 10*3/uL (ref 0.1–1.0)
Monocytes Relative: 11 %
Neutro Abs: 5.8 10*3/uL (ref 1.7–7.7)
Neutrophils Relative %: 63 %
Platelets: 261 10*3/uL (ref 150–400)
RBC: 4.29 MIL/uL (ref 3.87–5.11)
RDW: 11.9 % (ref 11.5–15.5)
WBC: 9.1 10*3/uL (ref 4.0–10.5)
nRBC: 0 % (ref 0.0–0.2)

## 2019-02-24 LAB — SARS CORONAVIRUS 2 BY RT PCR (HOSPITAL ORDER, PERFORMED IN ~~LOC~~ HOSPITAL LAB): SARS Coronavirus 2: NEGATIVE

## 2019-02-24 MED ORDER — ALBUTEROL SULFATE (2.5 MG/3ML) 0.083% IN NEBU: 2.5000 mg | INHALATION_SOLUTION | Freq: Once | RESPIRATORY_TRACT | Status: DC

## 2019-02-24 MED ORDER — ALBUTEROL SULFATE HFA 108 (90 BASE) MCG/ACT IN AERS
2.0000 | INHALATION_SPRAY | Freq: Four times a day (QID) | RESPIRATORY_TRACT | Status: DC | PRN
  Administered 2019-02-24: 2 via RESPIRATORY_TRACT
  Filled 2019-02-24: qty 6.7

## 2019-02-24 MED ORDER — PREDNISONE 50 MG PO TABS: 50.0000 mg | ORAL_TABLET | Freq: Every day | ORAL | 0 refills | Status: DC

## 2019-02-24 MED ORDER — PREDNISONE 20 MG PO TABS
60.0000 mg | ORAL_TABLET | Freq: Once | ORAL | Status: AC
  Administered 2019-02-24: 60 mg via ORAL
  Filled 2019-02-24: qty 3

## 2019-02-24 NOTE — ED Provider Notes (Addendum)
[facility] Emergency Department Provider Note   ____________________________________________    I have reviewed the triage vital signs and the nursing notes.   HISTORY  Chief Complaint URI     HPI Renee Benjamin is a 38 y.o. female who was seen earlier today for viral symptoms presents for reevaluation.  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  She reports she feels little different than she did this morning, she feels comfortable at rest but can get short of breath with exertion.  She continues to have some mild wheezes.  Past Medical History:  Diagnosis Date  . ADHD (attention deficit hyperactivity disorder) 01/09/2016  . Allergy   . Anemia   . Anxiety   . Anxiety    separation anxiety  . Asthma   . Chronic right hip pain 04/10/2016  . Collagen vascular disease ([place])   . Endometriosis   . Fibromyalgia 01/09/2016  . Headache    otc med prn  . Hypothyroidism   . Plantar fasciitis of left foot 08.29.14   PLANTAR HEEL ,FLUID AROUND MEDIAL BAND  . Polycystic ovarian disease   . Polysubstance abuse ([place]) 01/22/2018   ER visit March 2019: Drug screen is positive for marijuana, cocaine, benzodiazepines, barbiturates, and opiates  . Stress fracture 08.15.14   RIGHT   . Thoracic back pain   . Thyroid disease   . Vitamin D deficiency disease     Patient Active Problem List   Diagnosis Date Noted  . Pancreatitis 08/20/2018  . Right upper quadrant abdominal pain   . Polysubstance abuse ([place]) 01/22/2018  . Moderate recurrent major depression ([place]) 01/21/2018  . Cocaine abuse ([place]) 01/21/2018  . Cannabis abuse 01/21/2018  . Benzodiazepine abuse ([place]) 01/21/2018  . Barbiturate abuse ([place]) 01/21/2018  . Opiate abuse, episodic ([place]) 01/21/2018  . Allergic rhinitis  02/24/2017  . Vitamin B12 deficiency 02/24/2017  . Chronic right hip pain 04/10/2016  . Controlled substance agreement signed 02/01/2016  . Obesity 02/01/2016  . ADHD (attention deficit hyperactivity disorder) 01/09/2016  . Fibromyalgia 01/09/2016  . Torticollis, acute 10/08/2015  . Headache 10/02/2015  . Thoracic back pain 10/02/2015  . Lumbar pain 10/02/2015  . Arthralgia 10/02/2015  . Chronic tonsillitis 08/20/2015  . Migraine headache with aura 04/10/2015  . Hypothyroidism 04/10/2015  . PTSD (post-traumatic stress disorder) 04/10/2015  . Status post laparoscopic hysterectomy 01/03/2015  . Chronic female pelvic pain 11/23/2014  . Plantar fasciitis of left foot   . Stress fracture     Past Surgical History:  Procedure Laterality Date  . ABDOMINAL HYSTERECTOMY  March 2016   Due to uterus being stiched into C Section incision  . APPENDECTOMY    . APPENDECTOMY  2008  . CESAREAN SECTION    . CESAREAN SECTION  2014  . LAPAROSCOPIC BILATERAL SALPINGECTOMY Bilateral 01/02/2015   Procedure: LAPAROSCOPIC BILATERAL SALPINGECTOMY;  Surgeon: Osborne Oman, MD;  Location: [facility];  Service: Gynecology;  Laterality: Bilateral;  . LAPAROSCOPIC HYSTERECTOMY N/A 01/02/2015   Procedure: HYSTERECTOMY TOTAL LAPAROSCOPIC;  Surgeon: Osborne Oman, MD;  Location: [facility];  Service: Gynecology;  Laterality: N/A;  . SHOULDER SURGERY Right   . SHOULDER SURGERY  2001   right    Prior to Admission medications   Medication Sig Start Date End Date Taking? Authorizing Provider  amphetamine-dextroamphetamine (  ADDERALL) 20 MG tablet Take 20 mg by mouth 2 (two) times daily. 01/26/19   [provider]  ARIPiprazole (ABILIFY) 5 MG tablet Take 5 mg by mouth at bedtime. 03/09/18   [provider]  budesonide-formoterol (SYMBICORT) 80-4.5 MCG/ACT inhaler Inhale 2 puffs into the lungs 2 (two) times daily. 06/21/18   Arnetha Courser, MD  dicyclomine (BENTYL) 10 MG capsule Take 1 capsule (10 mg  total) by mouth 3 (three) times daily as needed for up to 5 days (abdominal cramping). 12/22/18 12/27/18  Arta Silence, MD  fexofenadine (ALLEGRA) 180 MG tablet Take 180 mg by mouth daily.    [provider]  hydrOXYzine (ATARAX/VISTARIL) 25 MG tablet Take 1-2 tablets (25-50 mg total) by mouth 4 (four) times daily as needed for anxiety. 12/22/18   Arta Silence, MD  levothyroxine (SYNTHROID, LEVOTHROID) 75 MCG tablet TAKE 1 TABLET BY MOUTH ONCE DAILY ON AN EMPTY STOMACH. WAIT 30 MINUTES BEFORE TAKING OTHER MEDS. 09/03/18   Poulose, Bethel Born, NP  lisdexamfetamine (VYVANSE) 30 MG capsule Take 30 mg by mouth daily.    [provider]  montelukast (SINGULAIR) 10 MG tablet TAKE 1 TABLET BY MOUTH EVERY DAY 07/14/18   Lada, Satira Anis, MD  ondansetron (ZOFRAN ODT) 8 MG disintegrating tablet Take 1 tablet (8 mg total) by mouth every 8 (eight) hours as needed for nausea or vomiting. 12/22/18   Arta Silence, MD  ondansetron (ZOFRAN) 4 MG tablet Take 1 tablet (4 mg total) by mouth every 8 (eight) hours as needed for nausea or vomiting. 08/19/18   Lisa Roca, MD  pantoprazole (PROTONIX) 40 MG tablet Take 1 tablet (40 mg total) by mouth daily. 08/21/18 10/20/18  Henreitta Leber, MD  predniSONE (DELTASONE) 50 MG tablet Take 1 tablet (50 mg total) by mouth daily with breakfast. 02/24/19   Lavonia Drafts, MD  PROAIR HFA 108 [phone] Base) MCG/ACT inhaler INHALE 2 PUFFS BY MOUTH EVERY 6 HOURS IFNEEDED FOR WHEEZING OR SHORTNESS OF BREATH. Patient taking differently: Inhale 2 puffs into the lungs every 6 (six) hours as needed for wheezing or shortness of breath.  02/23/18   Poulose, Bethel Born, NP  QUEtiapine (SEROQUEL) 100 MG tablet Take 100 mg by mouth at bedtime.  03/09/18   [provider]     Allergies Depakote [divalproex sodium]; Morphine; Propoxyphene; Tramadol; Tramadol; Darvocet [propoxyphene n-acetaminophen]; and Valproic acid  Family History  Problem Relation Age of  Onset  . Diabetes Father   . Osteoporosis Mother   . Cancer Maternal Grandmother        breast  . Osteoporosis Maternal Grandmother     Social History Social History   Tobacco Use  . Smoking status: Former Smoker    Packs/day: 0.25    Years: 4.00    Pack years: 1.00    Types: Cigarettes    Last attempt to quit: 04/17/2012    Years since quitting: 6.8  . Smokeless tobacco: Never Used  Substance Use Topics  . Alcohol use: No    Alcohol/week: 0.0 standard drinks  . Drug use: No    Review of Systems  Constitutional: Fatigue Eyes: No visual changes.  ENT: No sore throat. Cardiovascular: Denies chest pain. Respiratory: As above Gastrointestinal: No abdominal pain.  No nausea, no vomiting.   Genitourinary: Negative for dysuria. Musculoskeletal: Some myalgias Skin: Negative for rash. Neurological: Negative for headaches   ____________________________________________   PHYSICAL EXAM:  VITAL SIGNS: ED Triage Vitals  Enc Vitals Group     BP  02/24/19 1731 118/83     Pulse Rate 02/24/19 1731 (!) 109     Resp 02/24/19 1731 18     Temp 02/24/19 1731 99.3 F (37.4 C)     Temp Source 02/24/19 1731 Oral     SpO2 02/24/19 1731 97 %     Weight 02/24/19 1732 95.3 kg (210 lb)     Height 02/24/19 1732 1.727 m (5\' 8" )     Head Circumference --      Peak Flow --      Pain Score 02/24/19 1732 5     Pain Loc --      Pain Edu? --      Excl. in [place]? --     Constitutional: Alert and oriented.  Eyes: Conjunctivae are normal.  Head: Atraumatic. Nose: No congestion/rhinnorhea.   Cardiovascular:  Grossly normal heart sounds.  Good peripheral circulation. Respiratory: Normal respiratory effort.  No retractions.  Scattered wheezes Gastrointestinal: Soft and nontender. No distention.   Musculoskeletal: No lower extremity tenderness nor edema.  Warm and well perfused Neurologic:  Normal speech and language. No gross focal neurologic deficits are appreciated.  Skin:  Skin is warm,  dry and intact. No rash noted. Psychiatric: Mood and affect are normal. Speech and behavior are normal.  ____________________________________________   LABS (all labs ordered are listed, but only abnormal results are displayed)  Labs Reviewed  SARS CORONAVIRUS 2 (HOSPITAL ORDER, [facility])  CBC WITH DIFFERENTIAL/PLATELET   ____________________________________________  EKG  None ____________________________________________  RADIOLOGY  Reviewed chest x-ray from this morning, unremarkable ____________________________________________   PROCEDURES  Procedure(s) performed: No  Procedures   Critical Care performed: No ____________________________________________   INITIAL IMPRESSION / ASSESSMENT AND PLAN / ED COURSE  Pertinent labs & imaging results that were available during my care of the patient were reviewed by me and considered in my medical decision making (see chart for details).  Patient overall well-appearing, mildly tachycardic initially but this improved with rest.  Afebrile here in the emergency department.  CBC unremarkable reviewed chest x-ray from earlier today which was normal.  Rotavirus in-house PCR testing was negative, patient is reassured by this, we will give her prednisone for wheezing, recommend MDI use.  Outpatient follow-up as needed.   Renee Benjamin was evaluated in Emergency Department on 02/24/2019 for the symptoms described in the history of present illness. She was evaluated in the context of the global COVID-19 pandemic, which necessitated consideration that the patient might be at risk for infection with the SARS-CoV-2 virus that causes COVID-19. Institutional protocols and algorithms that pertain to the evaluation of patients at risk for COVID-19 are in a state of rapid change based on information released by regulatory bodies including the CDC and federal and state organizations. These policies and algorithms were  followed during the patient's care in the ED.     ____________________________________________   FINAL CLINICAL IMPRESSION(S) / ED DIAGNOSES  Final diagnoses:  Viral syndrome        Note:  This document was prepared using Dragon voice recognition software and may include unintentional dictation errors.   Lavonia Drafts, MD 02/24/19 2118    Lavonia Drafts, MD 02/24/19 2118

## 2019-02-24 NOTE — ED Provider Notes (Addendum)
[facility] Emergency Department Provider Note  ____________________________________________   I have reviewed the triage vital signs and the nursing notes. Where available I have reviewed prior notes and, if possible and indicated, outside hospital notes.    HISTORY  Chief Complaint Cough    HPI Renee Benjamin is a 38 y.o. female seasonal allergies anxiety anemia fibromyalgia, endometriosis history of polysubstance abuse, denies IV drug use, who presents today complaining of cough and runny nose and itchy eyes.  She does have allergies, she also has mild asthma.  She states she has been taking her Singulair and her albuterol as needed which is not significant quantities.  She is not having trouble to breathe but she is having a cough and this morning she developed a fever.  No known coronavirus exposures however it is endemic in the population.  She did not have any nausea vomiting diarrhea or abdominal pain.  She denies any chest pain or leg swelling no recent travel no personal family history of PE or DVT the cough is a little bit productive she states.  Also positive rhinorrhea and mild sore throat     Past Medical History:  Diagnosis Date  . ADHD (attention deficit hyperactivity disorder) 01/09/2016  . Allergy   . Anemia   . Anxiety   . Anxiety    separation anxiety  . Asthma   . Chronic right hip pain 04/10/2016  . Collagen vascular disease ([place])   . Endometriosis   . Fibromyalgia 01/09/2016  . Headache    otc med prn  . Hypothyroidism   . Plantar fasciitis of left foot 08.29.14   PLANTAR HEEL ,FLUID AROUND MEDIAL BAND  . Polycystic ovarian disease   . Polysubstance abuse ([place]) 01/22/2018   ER visit March 2019: Drug screen is positive for marijuana, cocaine, benzodiazepines, barbiturates, and opiates  . Stress fracture 08.15.14   RIGHT   . Thoracic back pain   . Thyroid disease   . Vitamin D deficiency disease     Patient Active Problem List    Diagnosis Date Noted  . Pancreatitis 08/20/2018  . Right upper quadrant abdominal pain   . Polysubstance abuse ([place]) 01/22/2018  . Moderate recurrent major depression ([place]) 01/21/2018  . Cocaine abuse ([place]) 01/21/2018  . Cannabis abuse 01/21/2018  . Benzodiazepine abuse ([place]) 01/21/2018  . Barbiturate abuse ([place]) 01/21/2018  . Opiate abuse, episodic ([place]) 01/21/2018  . Allergic rhinitis 02/24/2017  . Vitamin B12 deficiency 02/24/2017  . Chronic right hip pain 04/10/2016  . Controlled substance agreement signed 02/01/2016  . Obesity 02/01/2016  . ADHD (attention deficit hyperactivity disorder) 01/09/2016  . Fibromyalgia 01/09/2016  . Torticollis, acute 10/08/2015  . Headache 10/02/2015  . Thoracic back pain 10/02/2015  . Lumbar pain 10/02/2015  . Arthralgia 10/02/2015  . Chronic tonsillitis 08/20/2015  . Migraine headache with aura 04/10/2015  . Hypothyroidism 04/10/2015  . PTSD (post-traumatic stress disorder) 04/10/2015  . Status post laparoscopic hysterectomy 01/03/2015  . Chronic female pelvic pain 11/23/2014  . Plantar fasciitis of left foot   . Stress fracture     Past Surgical History:  Procedure Laterality Date  . ABDOMINAL HYSTERECTOMY  March 2016   Due to uterus being stiched into C Section incision  . APPENDECTOMY    . APPENDECTOMY  2008  . CESAREAN SECTION    . CESAREAN SECTION  2014  . LAPAROSCOPIC BILATERAL SALPINGECTOMY Bilateral 01/02/2015   Procedure: LAPAROSCOPIC BILATERAL SALPINGECTOMY;  Surgeon: Osborne Oman, MD;  Location: [facility];  Service: Gynecology;  Laterality: Bilateral;  . LAPAROSCOPIC HYSTERECTOMY N/A 01/02/2015   Procedure: HYSTERECTOMY TOTAL LAPAROSCOPIC;  Surgeon: Osborne Oman, MD;  Location: [facility];  Service: Gynecology;  Laterality: N/A;  . SHOULDER SURGERY Right   . SHOULDER SURGERY  2001   right    Prior to Admission medications   Medication Sig Start Date End Date Taking? Authorizing Provider  amphetamine-dextroamphetamine  (ADDERALL) 20 MG tablet Take 20 mg by mouth 2 (two) times daily. 01/26/19   [provider]  ARIPiprazole (ABILIFY) 5 MG tablet Take 5 mg by mouth at bedtime. 03/09/18   [provider]  budesonide-formoterol (SYMBICORT) 80-4.5 MCG/ACT inhaler Inhale 2 puffs into the lungs 2 (two) times daily. 06/21/18   Arnetha Courser, MD  dicyclomine (BENTYL) 10 MG capsule Take 1 capsule (10 mg total) by mouth 3 (three) times daily as needed for up to 5 days (abdominal cramping). 12/22/18 12/27/18  Arta Silence, MD  fexofenadine (ALLEGRA) 180 MG tablet Take 180 mg by mouth daily.    [provider]  hydrOXYzine (ATARAX/VISTARIL) 25 MG tablet Take 1-2 tablets (25-50 mg total) by mouth 4 (four) times daily as needed for anxiety. 12/22/18   Arta Silence, MD  levothyroxine (SYNTHROID, LEVOTHROID) 75 MCG tablet TAKE 1 TABLET BY MOUTH ONCE DAILY ON AN EMPTY STOMACH. WAIT 30 MINUTES BEFORE TAKING OTHER MEDS. 09/03/18   Poulose, Bethel Born, NP  lisdexamfetamine (VYVANSE) 30 MG capsule Take 30 mg by mouth daily.    [provider]  montelukast (SINGULAIR) 10 MG tablet TAKE 1 TABLET BY MOUTH EVERY DAY 07/14/18   Lada, Satira Anis, MD  ondansetron (ZOFRAN ODT) 8 MG disintegrating tablet Take 1 tablet (8 mg total) by mouth every 8 (eight) hours as needed for nausea or vomiting. 12/22/18   Arta Silence, MD  ondansetron (ZOFRAN) 4 MG tablet Take 1 tablet (4 mg total) by mouth every 8 (eight) hours as needed for nausea or vomiting. 08/19/18   Lisa Roca, MD  pantoprazole (PROTONIX) 40 MG tablet Take 1 tablet (40 mg total) by mouth daily. 08/21/18 10/20/18  Henreitta Leber, MD  PROAIR HFA 108 (90 Base) MCG/ACT inhaler INHALE 2 PUFFS BY MOUTH EVERY 6 HOURS IFNEEDED FOR WHEEZING OR SHORTNESS OF BREATH. Patient taking differently: Inhale 2 puffs into the lungs every 6 (six) hours as needed for wheezing or shortness of breath.  02/23/18   Poulose, Bethel Born, NP  QUEtiapine (SEROQUEL)  100 MG tablet Take 100 mg by mouth at bedtime.  03/09/18   [provider]    Allergies Depakote [divalproex sodium]; Morphine; Propoxyphene; Tramadol; Tramadol; Darvocet [propoxyphene n-acetaminophen]; and Valproic acid  Family History  Problem Relation Age of Onset  . Diabetes Father   . Osteoporosis Mother   . Cancer Maternal Grandmother        breast  . Osteoporosis Maternal Grandmother     Social History Social History   Tobacco Use  . Smoking status: Former Smoker    Packs/day: 0.25    Years: 4.00    Pack years: 1.00    Types: Cigarettes    Last attempt to quit: 04/17/2012    Years since quitting: 6.8  . Smokeless tobacco: Never Used  Substance Use Topics  . Alcohol use: No    Alcohol/week: 0.0 standard drinks  . Drug use: No    Review of Systems Constitutional: Positive fever Eyes: No visual changes. ENT: Positive sore throat. No stiff neck no neck pain Cardiovascular: Denies chest pain.  Respiratory: Denies shortness of breath.  Cough Gastrointestinal:   no vomiting.  No diarrhea.  No constipation. Genitourinary: Negative for dysuria. Musculoskeletal: Negative lower extremity swelling Skin: Negative for rash. Neurological: Negative for severe headaches, focal weakness or numbness.   ____________________________________________   PHYSICAL EXAM:  VITAL SIGNS: ED Triage Vitals  Enc Vitals Group     BP 02/24/19 0644 118/83     Pulse Rate 02/24/19 0644 (!) 102     Resp 02/24/19 0644 18     Temp 02/24/19 0644 97.9 F (36.6 C)     Temp Source 02/24/19 0644 Oral     SpO2 02/24/19 0644 99 %     Weight 02/24/19 0640 210 lb (95.3 kg)     Height 02/24/19 0640 5\' 8"  (1.727 m)     Head Circumference --      Peak Flow --      Pain Score 02/24/19 0640 0     Pain Loc --      Pain Edu? --      Excl. in [place]? --     Constitutional: Alert and oriented. Well appearing and in no acute distress. Eyes: Conjunctivae are normal Head: Atraumatic HEENT: +  clear congestion/rhinnorhea. Mucous membranes are moist.  Oropharynx non-erythematous Neck:   Nontender with no meningismus, no masses, no stridor Cardiovascular: Normal rate, regular rhythm. Grossly normal heart sounds.  Good peripheral circulation. Respiratory: Normal respiratory effort.  No retractions. Lungs very slight basilar wheeze Abdominal: Soft and nontender. No distention. No guarding no rebound Back:  There is no focal tenderness or step off.  there is no midline tenderness there are no lesions noted. there is no CVA tenderness Musculoskeletal: No lower extremity tenderness, no upper extremity tenderness. No joint effusions, no DVT signs strong distal pulses no edema Neurologic:  Normal speech and language. No gross focal neurologic deficits are appreciated.  Skin:  Skin is warm, dry and intact. No rash noted. Psychiatric: Mood and affect are normal. Speech and behavior are normal.  ____________________________________________   LABS (all labs ordered are listed, but only abnormal results are displayed)  Labs Reviewed - No data to display  Pertinent labs  results that were available during my care of the patient were reviewed by me and considered in my medical decision making (see chart for details). ____________________________________________  EKG  I personally interpreted any EKGs ordered by me or triage  ____________________________________________  RADIOLOGY  Pertinent labs & imaging results that were available during my care of the patient were reviewed by me and considered in my medical decision making (see chart for details). If possible, patient and/or family made aware of any abnormal findings.  No results found. ____________________________________________    PROCEDURES  Procedure(s) performed: None  Procedures  Critical Care performed: None  ____________________________________________   INITIAL IMPRESSION / ASSESSMENT AND PLAN / ED  COURSE  Pertinent labs & imaging results that were available during my care of the patient were reviewed by me and considered in my medical decision making (see chart for details).  Patient here with URI symptoms rhinorrhea cough low-grade fever afebrile here but apparently had a temperature at home that was elevated.  Patient did not take any antipyretics prior to coming in.  She is quite well-appearing, does have URI symptoms.  We will obtain a chest x-ray.  She has a very slight wheeze which she states is not atypical for her during allergy season she is taking allergy medication including Singulair Claritin and her inhaler.  [employer]  virus is certainly a possibility, COVID-19, however, we do not have testing for patients who are not going to the ICU at this time.  At this time it is more a clinical diagnosis.  The question without testing then becomes whether antibiotics are indicated this clearly appears to be a viral constellation of symptoms we will see what chest x-ray shows.  I will give her albuterol here she is not working hard to breathe her sats are 99% I do not think she merits admission fortunately.  There are some data that suggest systemic steroids in coronavirus are  Counter indicated, and as her wheezes very mild I will try to avoid them if possible.  Obviously these data are subjective to changes as disease knowledge unfolds.  ARYA LUTTRULL was evaluated in Emergency Department on 02/24/2019 for the symptoms described in the history of present illness. She was evaluated in the context of the global COVID-19 pandemic, which necessitated consideration that the patient might be at risk for infection with the SARS-CoV-2 virus that causes COVID-19. Institutional protocols and algorithms that pertain to the evaluation of patients at risk for COVID-19 are in a state of rapid change based on information released by regulatory bodies including the CDC and federal and state organizations. These  policies and algorithms were followed during the patient's care in the ED.  ----------------------------------------- 8:48 AM on 02/24/2019 -----------------------------------------  Her albuterol patient's lungs are clear she is in no acute distress she and I talked about antibiotics at this time I do not think they are indicated and actually could cause things to get worse.  Patient agrees she does not want antibiotic she does want a note for her job which I will provide.  Obviously I cannot write a note from the ER for 2 weeks but I have advised her about quarantine and advised her to quarantine herself.  I have also advised that she return to the emergency room for any new or worrisome symptoms and we discussed all of the ones that I am concerned about she understands that.  I think patient is right to decline antibiotics I do not think at this time they are indicated.  Fortunately, asthma does not seem to be an independent risk factor for decompensation from coronavirus and she is only 69 which hopefully will also way well in her favor.  However, she understands if she feels worse she is to return.  Patient agrees with this plan and is eager to be discharged.    ____________________________________________   FINAL CLINICAL IMPRESSION(S) / ED DIAGNOSES  Final diagnoses:  Cough      This chart was dictated using voice recognition software.  Despite best efforts to proofread,  errors can occur which can change meaning.      Schuyler Amor, MD 02/24/19 [id number]    Schuyler Amor, MD 02/24/19 [id number]    Schuyler Amor, MD 02/24/19 [phone]

## 2019-02-24 NOTE — Discharge Instructions (Addendum)
Person Under Monitoring Name: Renee Benjamin  Location: [place] [address]   Infection Prevention Recommendations for Individuals Confirmed to have, or Being Evaluated for, 2019 Novel Coronavirus (COVID-19) Infection Who Receive Care at Home  Individuals who are confirmed to have, or are being evaluated for, COVID-19 should follow the prevention steps below until a healthcare provider or local or state health department says they can return to normal activities.  Stay home except to get medical care You should restrict activities outside your home, except for getting medical care. Do not go to work, school, or public areas, and do not use public transportation or taxis.  Call ahead before visiting your doctor Before your medical appointment, call the healthcare provider and tell them that you have, or are being evaluated for, COVID-19 infection. This will help the healthcare providers office take steps to keep other people from getting infected. Ask your healthcare provider to call the local or state health department.  Monitor your symptoms Seek prompt medical attention if your illness is worsening (e.g., difficulty breathing). Before going to your medical appointment, call the healthcare provider and tell them that you have, or are being evaluated for, COVID-19 infection. Ask your healthcare provider to call the local or state health department.  Wear a facemask You should wear a facemask that covers your nose and mouth when you are in the same room with other people and when you visit a healthcare provider. People who live with or visit you should also wear a facemask while they are in the same room with you.  Separate yourself from other people in your home As much as possible, you should stay in a different room from other people in your home. Also, you should use a separate bathroom, if available.  Avoid sharing household items You should not  share dishes, drinking glasses, cups, eating utensils, towels, bedding, or other items with other people in your home. After using these items, you should wash them thoroughly with soap and water.  Cover your coughs and sneezes Cover your mouth and nose with a tissue when you cough or sneeze, or you can cough or sneeze into your sleeve. Throw used tissues in a lined trash can, and immediately wash your hands with soap and water for at least 20 seconds or use an alcohol-based hand rub.  Wash your [facility] your hands often and thoroughly with soap and water for at least 20 seconds. You can use an alcohol-based hand sanitizer if soap and water are not available and if your hands are not visibly dirty. Avoid touching your eyes, nose, and mouth with unwashed hands.   Prevention Steps for Caregivers and Household Members of Individuals Confirmed to have, or Being Evaluated for, COVID-19 Infection Being Cared for in the Home  If you live with, or provide care at home for, a person confirmed to have, or being evaluated for, COVID-19 infection please follow these guidelines to prevent infection:  Follow healthcare providers instructions Make sure that you understand and can help the patient follow any healthcare provider instructions for all care.  Provide for the patients basic needs You should help the patient with basic needs in the home and provide support for getting groceries, prescriptions, and other personal needs.  Monitor the patients symptoms If they are getting sicker, call his or her medical provider and tell them that the patient has, or is being evaluated for, COVID-19 infection. This will help the healthcare providers office  take steps to keep other people from getting infected. Ask the healthcare provider to call the local or state health department.  Limit the number of people who have contact with the patient If possible, have only one caregiver for the  patient. Other household members should stay in another home or place of residence. If this is not possible, they should stay in another room, or be separated from the patient as much as possible. Use a separate bathroom, if available. Restrict visitors who do not have an essential need to be in the home.  Keep older adults, very young children, and other sick people away from the patient Keep older adults, very young children, and those who have compromised immune systems or chronic health conditions away from the patient. This includes people with chronic heart, lung, or kidney conditions, diabetes, and cancer.  Ensure good ventilation Make sure that shared spaces in the home have good air flow, such as from an air conditioner or an opened window, weather permitting.  Wash your hands often Wash your hands often and thoroughly with soap and water for at least 20 seconds. You can use an alcohol based hand sanitizer if soap and water are not available and if your hands are not visibly dirty. Avoid touching your eyes, nose, and mouth with unwashed hands. Use disposable paper towels to dry your hands. If not available, use dedicated cloth towels and replace them when they become wet.  Wear a facemask and gloves Wear a disposable facemask at all times in the room and gloves when you touch or have contact with the patients blood, body fluids, and/or secretions or excretions, such as sweat, saliva, sputum, nasal mucus, vomit, urine, or feces.  Ensure the mask fits over your nose and mouth tightly, and do not touch it during use. Throw out disposable facemasks and gloves after using them. Do not reuse. Wash your hands immediately after removing your facemask and gloves. If your personal clothing becomes contaminated, carefully remove clothing and launder. Wash your hands after handling contaminated clothing. Place all used disposable facemasks, gloves, and other waste in a lined container before  disposing them with other household waste. Remove gloves and wash your hands immediately after handling these items.  Do not share dishes, glasses, or other household items with the patient Avoid sharing household items. You should not share dishes, drinking glasses, cups, eating utensils, towels, bedding, or other items with a patient who is confirmed to have, or being evaluated for, COVID-19 infection. After the person uses these items, you should wash them thoroughly with soap and water.  Wash laundry thoroughly Immediately remove and wash clothes or bedding that have blood, body fluids, and/or secretions or excretions, such as sweat, saliva, sputum, nasal mucus, vomit, urine, or feces, on them. Wear gloves when handling laundry from the patient. Read and follow directions on labels of laundry or clothing items and detergent. In general, wash and dry with the warmest temperatures recommended on the label.  Clean all areas the individual has used often Clean all touchable surfaces, such as counters, tabletops, doorknobs, bathroom fixtures, toilets, phones, keyboards, tablets, and bedside tables, every day. Also, clean any surfaces that may have blood, body fluids, and/or secretions or excretions on them. Wear gloves when cleaning surfaces the patient has come in contact with. Use a diluted bleach solution (e.g., dilute bleach with 1 part bleach and 10 parts water) or a household disinfectant with a label that says EPA-registered for coronaviruses. To make a bleach  solution at home, add 1 tablespoon of bleach to 1 quart (4 cups) of water. For a larger supply, add  cup of bleach to 1 gallon (16 cups) of water. Read labels of cleaning products and follow recommendations provided on product labels. Labels contain instructions for safe and effective use of the cleaning product including precautions you should take when applying the product, such as wearing gloves or eye protection and making sure you  have good ventilation during use of the product. Remove gloves and wash hands immediately after cleaning.  Monitor yourself for signs and symptoms of illness Caregivers and household members are considered close contacts, should monitor their health, and will be asked to limit movement outside of the home to the extent possible. Follow the monitoring steps for close contacts listed on the symptom monitoring form.   ? If you have additional questions, contact your local health department or call the epidemiologist on call at [phone] (available 24/7). ? This guidance is subject to change. For the most up-to-date guidance from [facility], please refer to their website: YouBlogs.pl

## 2019-02-24 NOTE — ED Triage Notes (Signed)
Pt in via POV, reports cough, body aches, fever, seen here today for same.  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  NAD noted at this time.

## 2019-02-24 NOTE — Telephone Encounter (Signed)
Tried calling patient to schedule a virtual follow up visit with our office per ER discharge instructions.  Unable to leave message due to voicemail box being full.

## 2019-02-24 NOTE — Telephone Encounter (Signed)
Pt called back and is now scheduled for a virtual appt with Dr. Sanda Klein on 02/25/2019.

## 2019-02-24 NOTE — ED Notes (Signed)
Pt alert and oriented X4, active, cooperative, pt in NAD. RR even and unlabored, color WNL.  Pt informed to return if any life threatening symptoms occur.  Discharge and followup instructions reviewed. Ambulates safely. 

## 2019-02-24 NOTE — ED Triage Notes (Signed)
Patient ambulatory to triage with steady gait, without difficulty or distress noted, mask in place; pt reports cough, fever, congestion and body aches since last night

## 2019-02-24 NOTE — ED Notes (Signed)
Called pharmacy to inquire why order for inhaler was changed to nebulizer-states that they will reorder the original inhaler.

## 2019-02-24 NOTE — Telephone Encounter (Signed)
Pt called stating that she was seen in the ER today and has the symptoms of COVID-19.  She states her instructions state to contact her PCP for further instructions. Pt states her symptoms started last nigh with fever dry cough rt side chest pain and SOB headache.  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 She states the ER x rayed her chest and said she has no pneumonia. She has treated with tylenol last night. Care advice read to patient. Pt verbalized understanding of all instruction. E-mail verified.   Attempted to transfer to office for appointment x4 attempts Line busy. Note will be routed. Pt will call back if she does not receive call from office within 1 hour.  Reason for Disposition . MILD difficulty breathing (e.g., minimal/no SOB at rest, SOB with walking, pulse <100)  Answer Assessment - Initial Assessment Questions 1. COVID-19 DIAGNOSIS: "Who made your Coronavirus (COVID-19) diagnosis?" "Was it confirmed by a positive lab test?" If not diagnosed by a HCP, ask "Are there lots of cases (community spread) where you live?" (See public health department website, if unsure)   * MAJOR community spread: high number of cases; numbers of cases are increasing; many people hospitalized.   * MINOR community spread: low number of cases; not increasing; few or no people hospitalized     [place] 2. ONSET: "When did the COVID-19 symptoms start?"      Last night 3. WORST SYMPTOM: "What is your worst symptom?" (e.g., cough, fever, shortness of breath, muscle aches)     Cough, sob, fever 101.1 headache 4. COUGH: "How bad is the cough?"      dry 5. FEVER: "Do you have a fever?" If so, ask: "What is your temperature, how was it measured, and when did it start?"     101.1 last night 6. RESPIRATORY STATUS: "Describe your breathing?" (e.g., shortness of breath, wheezing, unable to speak)      Cant take a deep breath 7. BETTER-SAME-WORSE: "Are you getting  better, staying the same or getting worse compared to yesterday?"  If getting worse, ask, "In what way?"    worse 8. HIGH RISK DISEASE: "Do you have any chronic medical problems?" (e.g., asthma, heart or lung disease, weak immune system, etc.)     asthma 9. PREGNANCY: "Is there any chance you are pregnant?" "When was your last menstrual period?"    No histerectomy 10. OTHER SYMPTOMS: "Do you have any other symptoms?"  (e.g., runny nose, headache, sore throat, loss of smell)       Runny nose headache sore throat  Protocols used: CORONAVIRUS (COVID-19) DIAGNOSED OR SUSPECTED-A-AH

## 2019-02-24 NOTE — Discharge Instructions (Addendum)
Your coronavirus test was negative today. Please remain home and rest

## 2019-02-25 ENCOUNTER — Ambulatory Visit (INDEPENDENT_AMBULATORY_CARE_PROVIDER_SITE_OTHER): Payer: Medicaid Other | Admitting: Family Medicine

## 2019-02-25 ENCOUNTER — Encounter: Payer: Self-pay | Admitting: Family Medicine

## 2019-02-25 VITALS — Temp 99.2°F

## 2019-02-25 DIAGNOSIS — B349 Viral infection, unspecified: Secondary | ICD-10-CM

## 2019-02-25 NOTE — Progress Notes (Signed)
Temp 99.2 F (37.3 C) (Oral)   LMP 10/30/2014    Subjective:    Patient ID: Renee Benjamin, female    DOB: 27-Dec-1980, 38 y.o.   MRN: [id number]  HPI: Renee Benjamin is a 39 y.o. female  Chief Complaint  Patient presents with  . URI    went to ER dx with URI neg for covid    HPI Virtual Visit via Telephone/Video Note  Due to national recommendations of social distancing due to the COVID-19 pandemic, an audio/video telehealth visit is felt to be most appropriate for this patient at this time.    I connected with the patient via:  Doximity video Call started: 10:04 (estimated) I verified that I am speaking with the correct person using two identifiers.  Provider location: home office with door closed, earphones / headset on Patient location: home on couch Additional participants: no one else  I discussed the limitations, risks, and privacy concerns of performing an evaluation and management service by telephone. I discussed the availability of in-person appointments. I explained that he/she may be responsible for charges related to this service. The patient expressed understanding and agreed to proceed.  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; social distancing, only to grocery store Her fever is 99; yesterday it was 54 yesterday First day of being sick was two ago; having body aches; she does not feel like she has the flu righ tnow; once the fever broke, her body does not hurt so bad; first onset of sx was Wed; she has taken tamiflu in the past; she is afraid of adding something else to her body; not tested in the ER for flu or respiratory virus Shortness of  breath, a little bit better than yesterday; she does not feel like she needs to go back to the ER; she talked to the health department already and they are aware of her case; she has to stay out for 7 days from the start of symptoms; the health dept did not provide note but they are communicating with her job's director; health dept and work are in communication She is all over the center, 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 0 No No No No  Number falls in past yr: 0 - - - -  Injury with Fall? 0 - - - -    Relevant past medical, surgical, family and social history reviewed Past Medical History:  Diagnosis Date  . ADHD (attention deficit hyperactivity disorder) 01/09/2016  . Allergy   . Anemia   . Anxiety   . Anxiety  separation anxiety  . Asthma   . Chronic right hip pain 04/10/2016  . Collagen vascular disease ([place])   . Endometriosis   . Fibromyalgia 01/09/2016  . Headache    otc med prn  . Hypothyroidism   . Plantar fasciitis of left foot 08.29.14   PLANTAR HEEL ,FLUID AROUND MEDIAL BAND  . Polycystic ovarian disease   . Polysubstance abuse ([place]) 01/22/2018   ER visit March 2019: Drug screen is positive for marijuana, cocaine, benzodiazepines, barbiturates, and opiates  . Stress fracture 08.15.14   RIGHT   . Thoracic back pain   . Thyroid disease   . Vitamin D deficiency disease    Past Surgical History:  Procedure Laterality Date  . ABDOMINAL HYSTERECTOMY  March 2016   Due to uterus  being stiched into C Section incision  . APPENDECTOMY    . APPENDECTOMY  2008  . CESAREAN SECTION    . CESAREAN SECTION  2014  . LAPAROSCOPIC BILATERAL SALPINGECTOMY Bilateral 01/02/2015   Procedure: LAPAROSCOPIC BILATERAL SALPINGECTOMY;  Surgeon: Osborne Oman, MD;  Location: [facility];  Service: Gynecology;  Laterality: Bilateral;  . LAPAROSCOPIC HYSTERECTOMY N/A 01/02/2015   Procedure: HYSTERECTOMY TOTAL LAPAROSCOPIC;  Surgeon: Osborne Oman, MD;  Location: [facility];  Service: Gynecology;  Laterality: N/A;  . SHOULDER SURGERY Right   . SHOULDER SURGERY  2001   right   Family History  Problem Relation Age of Onset  . Diabetes Father   . Osteoporosis Mother   . Cancer Maternal Grandmother        breast  . Osteoporosis Maternal Grandmother    Social History   Tobacco Use  . Smoking status: Former Smoker    Packs/day: 0.25    Years: 4.00    Pack years: 1.00    Types: Cigarettes    Last attempt to quit: 04/17/2012    Years since quitting: 6.8  . Smokeless tobacco: Never Used  Substance Use Topics  . Alcohol use: No    Alcohol/week: 0.0 standard drinks  . Drug use: No     [facility]/25/[facility] in [facility]  AUDIT-C Score  0      Interim medical history since last visit reviewed. Allergies and medications reviewed  Review of Systems Per HPI unless specifically indicated above     Objective:    Temp 99.2 F (37.3 C) (Oral)   LMP 10/30/2014   Wt Readings from Last 3 Encounters:  02/24/19 210 lb (95.3 kg)  02/24/19 210 lb (95.3 kg)  01/02/19 210 lb (95.3 kg)    Physical Exam Constitutional:      General: She is not in acute distress.    Appearance: Normal appearance.     Comments: Resting comfortable on couch with blanket on; no respiratory distress; occasional cough  Eyes:     Extraocular Movements: Extraocular movements intact.     Right eye: Normal extraocular motion.     Left eye: Normal extraocular motion.  Pulmonary:      Effort: No tachypnea or respiratory distress.     Comments: Able to speak in full sentences without stopping for breath; no audible wheezing Skin:    Coloration: Skin is not jaundiced or pale.  Neurological:     Mental Status: She is alert.     Cranial Nerves: No dysarthria.  Psychiatric:        Mood and Affect: Mood is not anxious or depressed.        Speech: Speech is not rapid  and pressured, delayed or slurred.     Comments: Smiling, good eye contact with examiner via video; did not appear anxious or depressed     Results for orders placed or performed during the hospital encounter of 02/24/19  SARS Coronavirus 2 [facility] order, Performed in [facility] hospital lab)  Result Value Ref Range   SARS Coronavirus 2 NEGATIVE NEGATIVE  CBC with Differential  Result Value Ref Range   WBC 9.1 4.0 - 10.5 K/uL   RBC 4.29 3.87 - 5.11 MIL/uL   Hemoglobin 13.5 12.0 - 15.0 g/dL   HCT 38.7 36.0 - 46.0 %   MCV 90.2 80.0 - 100.0 fL   MCH 31.5 26.0 - 34.0 pg   MCHC 34.9 30.0 - 36.0 g/dL   RDW 11.9 11.5 - 15.5 %   Platelets 261 150 - 400 K/uL   nRBC 0.0 0.0 - 0.2 %   Neutrophils Relative % 63 %   Neutro Abs 5.8 1.7 - 7.7 K/uL   Lymphocytes Relative 23 %   Lymphs Abs 2.1 0.7 - 4.0 K/uL   Monocytes Relative 11 %   Monocytes Absolute 1.0 0.1 - 1.0 K/uL   Eosinophils Relative 2 %   Eosinophils Absolute 0.2 0.0 - 0.5 K/uL   Basophils Relative 1 %   Basophils Absolute 0.1 0.0 - 0.1 K/uL   Immature Granulocytes 0 %   Abs Immature Granulocytes 0.02 0.00 - 0.07 K/uL      Assessment & Plan:   Problem List Items Addressed This Visit    None    Visit Diagnoses    Viral infection    -  Primary   neg for COVID-19, however, quarantine b/c there are false neg; continue meds per ER; rest and hydration; out until 7d + afeb x 72 hours and resp better      Patient was advised to call 911 or return to the ER if symptoms worsen, become severe; she agrees  Follow up plan: Return if symptoms worsen  or fail to improve.   No orders of the defined types were placed in this encounter.   No orders of the defined types were placed in this encounter.

## 2019-02-28 ENCOUNTER — Telehealth: Payer: Self-pay

## 2019-02-28 ENCOUNTER — Encounter: Payer: Self-pay | Admitting: Family Medicine

## 2019-02-28 NOTE — Telephone Encounter (Signed)
Copied from [place] [id number]. Topic: Quick Communication - Other Results (Clinic Use ONLY) >> Feb 28, 2019  1:51 PM Renee Benjamin wrote: Pt is wanting to schedule appt for wheezing. Should she come in to be seen or can this be a doximity appt

## 2019-02-28 NOTE — Telephone Encounter (Signed)
Pt scheduled for tomrorrow with Benjamine Mola, NP at 9:40.

## 2019-02-28 NOTE — Telephone Encounter (Signed)
If mild and without fevers can be seen virtually. If moderate or with fevers needs to be seen in office preferably afternoons,  If severe needs to go to ER

## 2019-03-01 ENCOUNTER — Encounter: Payer: Self-pay | Admitting: Nurse Practitioner

## 2019-03-01 ENCOUNTER — Ambulatory Visit (INDEPENDENT_AMBULATORY_CARE_PROVIDER_SITE_OTHER): Payer: Medicaid Other | Admitting: Nurse Practitioner

## 2019-03-01 ENCOUNTER — Other Ambulatory Visit: Payer: Self-pay

## 2019-03-01 DIAGNOSIS — J4521 Mild intermittent asthma with (acute) exacerbation: Secondary | ICD-10-CM

## 2019-03-01 DIAGNOSIS — B9789 Other viral agents as the cause of diseases classified elsewhere: Secondary | ICD-10-CM

## 2019-03-01 DIAGNOSIS — J069 Acute upper respiratory infection, unspecified: Secondary | ICD-10-CM

## 2019-03-01 DIAGNOSIS — J302 Other seasonal allergic rhinitis: Secondary | ICD-10-CM

## 2019-03-01 MED ORDER — PREDNISONE 10 MG (48) PO TBPK: ORAL_TABLET | ORAL | 0 refills | Status: DC

## 2019-03-01 MED ORDER — DM-GUAIFENESIN ER 30-600 MG PO TB12: 1.0000 | ORAL_TABLET | Freq: Two times a day (BID) | ORAL | 0 refills | Status: DC

## 2019-03-01 NOTE — Progress Notes (Signed)
Virtual Visit via Video Note  I connected with Renee Benjamin on 03/01/19 at  9:40 AM EDT by a video enabled telemedicine application and verified that I am speaking with the correct person using two identifiers.   Staff discussed the limitations of evaluation and management by telemedicine and the availability of in person appointments. The patient expressed understanding and agreed to proceed.  Patient location: home  My location: work office Other people present: none  HPI  Patient went to ER on 4/23 to be evaluated for URI. She tested negative for COVID19. Was provided with 3 day course of prednisone with some relief for breathing but then when she was off it shortness of breath returned and wheezing worsened. X-ray was clear.   Patient endorses deep wheezing and right sided chest pain with coughing. States yesterday hasn't been unable to to taste anything. Symptoms first started last Wednesday on 4/22 with watery, itchy eyes then woke up with fever, shortness of breath and diarrhea. States the last day she had a fever was on Saturday 4/25. She has been using albuterol 1-2 puffs every 3-4 hours. States she gets thick foamy sputum. Cough is worse throughout the day, worse with talking.     PHQ2/9: Depression screen [facility] 2/9 03/01/2019 02/25/2019 07/20/2018 06/21/2018 05/17/2018  Decreased Interest 0 - 0 0 0  Down, Depressed, Hopeless 0 - - 1 0  PHQ - 2 Score 0 - 0 1 0  Altered sleeping 0 - 0 0 0  Tired, decreased energy 0 - 0 1 0  Change in appetite 0 - 0 3 1  Feeling bad or failure about yourself  0 - 0 0 0  Trouble concentrating 0 - 0 0 0  Moving slowly or fidgety/restless 0 - 0 0 0  Suicidal thoughts 0 0 0 0 0  PHQ-9 Score 0 - 0 5 -  Difficult doing work/chores Not difficult at all - Not difficult at all Somewhat difficult Not difficult at all     PHQ reviewed. Negative  Patient Active Problem List   Diagnosis Date Noted  . Pancreatitis 08/20/2018  . Right upper quadrant  abdominal pain   . Polysubstance abuse ([place]) 01/22/2018  . Moderate recurrent major depression ([place]) 01/21/2018  . Cocaine abuse ([place]) 01/21/2018  . Cannabis abuse 01/21/2018  . Benzodiazepine abuse ([place]) 01/21/2018  . Barbiturate abuse ([place]) 01/21/2018  . Opiate abuse, episodic ([place]) 01/21/2018  . Allergic rhinitis 02/24/2017  . Vitamin B12 deficiency 02/24/2017  . Chronic right hip pain 04/10/2016  . Controlled substance agreement signed 02/01/2016  . Obesity 02/01/2016  . ADHD (attention deficit hyperactivity disorder) 01/09/2016  . Fibromyalgia 01/09/2016  . Torticollis, acute 10/08/2015  . Headache 10/02/2015  . Thoracic back pain 10/02/2015  . Lumbar pain 10/02/2015  . Arthralgia 10/02/2015  . Chronic tonsillitis 08/20/2015  . Migraine headache with aura 04/10/2015  . Hypothyroidism 04/10/2015  . PTSD (post-traumatic stress disorder) 04/10/2015  . Status post laparoscopic hysterectomy 01/03/2015  . Chronic female pelvic pain 11/23/2014  . Plantar fasciitis of left foot   . Stress fracture     Past Medical History:  Diagnosis Date  . ADHD (attention deficit hyperactivity disorder) 01/09/2016  . Allergy   . Anemia   . Anxiety   . Anxiety    separation anxiety  . Asthma   . Chronic right hip pain 04/10/2016  . Collagen vascular disease ([place])   . Endometriosis   . Fibromyalgia 01/09/2016  . Headache    otc med prn  .  Hypothyroidism   . Plantar fasciitis of left foot 08.29.14   PLANTAR HEEL ,FLUID AROUND MEDIAL BAND  . Polycystic ovarian disease   . Polysubstance abuse ([place]) 01/22/2018   ER visit March 2019: Drug screen is positive for marijuana, cocaine, benzodiazepines, barbiturates, and opiates  . Stress fracture 08.15.14   RIGHT   . Thoracic back pain   . Thyroid disease   . Vitamin D deficiency disease     Past Surgical History:  Procedure Laterality Date  . ABDOMINAL HYSTERECTOMY  March 2016   Due to uterus being stiched into C Section incision  .  APPENDECTOMY    . APPENDECTOMY  2008  . CESAREAN SECTION    . CESAREAN SECTION  2014  . LAPAROSCOPIC BILATERAL SALPINGECTOMY Bilateral 01/02/2015   Procedure: LAPAROSCOPIC BILATERAL SALPINGECTOMY;  Surgeon: Osborne Oman, MD;  Location: [facility];  Service: Gynecology;  Laterality: Bilateral;  . LAPAROSCOPIC HYSTERECTOMY N/A 01/02/2015   Procedure: HYSTERECTOMY TOTAL LAPAROSCOPIC;  Surgeon: Osborne Oman, MD;  Location: [facility];  Service: Gynecology;  Laterality: N/A;  . SHOULDER SURGERY Right   . SHOULDER SURGERY  2001   right    Social History   Tobacco Use  . Smoking status: Former Smoker    Packs/day: 0.25    Years: 4.00    Pack years: 1.00    Types: Cigarettes    Last attempt to quit: 04/17/2012    Years since quitting: 6.8  . Smokeless tobacco: Never Used  Substance Use Topics  . Alcohol use: No    Alcohol/week: 0.0 standard drinks     Current Outpatient Medications:  .  amphetamine-dextroamphetamine (ADDERALL) 20 MG tablet, Take 20 mg by mouth 2 (two) times daily., Disp: , Rfl:  .  ARIPiprazole (ABILIFY) 5 MG tablet, Take 5 mg by mouth at bedtime., Disp: , Rfl: 0 .  budesonide-formoterol (SYMBICORT) 80-4.5 MCG/ACT inhaler, Inhale 2 puffs into the lungs 2 (two) times daily., Disp: 1 Inhaler, Rfl: 3 .  fexofenadine (ALLEGRA) 180 MG tablet, Take 180 mg by mouth daily., Disp: , Rfl:  .  hydrOXYzine (ATARAX/VISTARIL) 25 MG tablet, Take 1-2 tablets (25-50 mg total) by mouth 4 (four) times daily as needed for anxiety., Disp: 30 tablet, Rfl: 0 .  levothyroxine (SYNTHROID, LEVOTHROID) 75 MCG tablet, TAKE 1 TABLET BY MOUTH ONCE DAILY ON AN EMPTY STOMACH. WAIT 30 MINUTES BEFORE TAKING OTHER MEDS., Disp: 30 tablet, Rfl: 5 .  montelukast (SINGULAIR) 10 MG tablet, TAKE 1 TABLET BY MOUTH EVERY DAY, Disp: 30 tablet, Rfl: 11 .  pantoprazole (PROTONIX) 40 MG tablet, Take 1 tablet (40 mg total) by mouth daily., Disp: 30 tablet, Rfl: 1 .  PROAIR HFA 108 (90 Base) MCG/ACT inhaler, INHALE 2  PUFFS BY MOUTH EVERY 6 HOURS IFNEEDED FOR WHEEZING OR SHORTNESS OF BREATH. (Patient taking differently: Inhale 2 puffs into the lungs every 6 (six) hours as needed for wheezing or shortness of breath. ), Disp: 8.5 g, Rfl: 0 .  QUEtiapine (SEROQUEL) 100 MG tablet, Take 100 mg by mouth at bedtime. , Disp: , Rfl: 0 .  dicyclomine (BENTYL) 10 MG capsule, Take 1 capsule (10 mg total) by mouth 3 (three) times daily as needed for up to 5 days (abdominal cramping). (Patient not taking: Reported on 03/01/2019), Disp: 15 capsule, Rfl: 0 .  lisdexamfetamine (VYVANSE) 30 MG capsule, Take 30 mg by mouth daily., Disp: , Rfl:  .  ondansetron (ZOFRAN ODT) 8 MG disintegrating tablet, Take 1 tablet (8 mg total) by mouth every 8 (  eight) hours as needed for nausea or vomiting. (Patient not taking: Reported on 02/25/2019), Disp: 15 tablet, Rfl: 0 .  ondansetron (ZOFRAN) 4 MG tablet, Take 1 tablet (4 mg total) by mouth every 8 (eight) hours as needed for nausea or vomiting. (Patient not taking: Reported on 02/25/2019), Disp: 10 tablet, Rfl: 0 .  predniSONE (DELTASONE) 50 MG tablet, Take 1 tablet (50 mg total) by mouth daily with breakfast. (Patient not taking: Reported on 03/01/2019), Disp: 3 tablet, Rfl: 0  Allergies  Allergen Reactions  . Depakote [Divalproex Sodium] Hives  . Morphine Hives  . Propoxyphene Other (See Comments)  . Tramadol Nausea Only  . Tramadol Nausea And Vomiting and Other (See Comments)  . Darvocet [Propoxyphene N-Acetaminophen] Rash  . Valproic Acid Rash    ROS   No other specific complaints in a complete review of systems (except as listed in HPI above).  Objective  There were no vitals filed for this visit.   There is no height or weight on file to calculate BMI.  Nursing Note and Vital Signs reviewed.  Physical Exam  Constitutional: Patient appears well-developed and well-nourished. No distress.  HENT: Head: Normocephalic and atraumatic. Pulmonary/Chest: Effort normal, strong dry  hacking cough during assessment  Musculoskeletal: Normal range of motion,  Neurological: she is alert and oriented to person, place, and time. speech is normal.  Skin: No rash noted. No erythema.  Psychiatric: Patient has a normal mood and affect. behavior is normal. Judgment and thought content normal.    Assessment & Plan  1. Mild intermittent asthma with acute exacerbation Work note provided electronically  - predniSONE (STERAPRED UNI-PAK 48 TAB) 10 MG (48) TBPK tablet; Take as directed  Dispense: 48 tablet; Refill: 0  2. Seasonal allergies - dextromethorphan-guaiFENesin (MUCINEX DM) 30-600 MG 12hr tablet; Take 1 tablet by mouth 2 (two) times daily.  Dispense: 30 tablet; Refill: 0    Follow Up Instructions: If unimproved in 3 days return for re-evaluation; discussed ER precautions, recommend self quarantine despite negative testing.    I discussed the assessment and treatment plan with the patient. The patient was provided an opportunity to ask questions and all were answered. The patient agreed with the plan and demonstrated an understanding of the instructions.   The patient was advised to call back or seek an in-person evaluation if the symptoms worsen or if the condition fails to improve as anticipated.  I provided 15 minutes of non-face-to-face time during this encounter.   Fredderick Severance, NP

## 2019-03-03 ENCOUNTER — Encounter: Payer: Self-pay | Admitting: Family Medicine

## 2019-03-07 ENCOUNTER — Emergency Department: Payer: Self-pay

## 2019-03-07 ENCOUNTER — Emergency Department
Admission: EM | Admit: 2019-03-07 | Discharge: 2019-03-07 | Disposition: A | Discharge status: Home / Self Care | Payer: Self-pay | Attending: Student in an Organized Health Care Education/Training Program | Admitting: Student in an Organized Health Care Education/Training Program

## 2019-03-07 ENCOUNTER — Other Ambulatory Visit: Payer: Self-pay

## 2019-03-07 ENCOUNTER — Encounter: Payer: Self-pay | Admitting: Emergency Medicine

## 2019-03-07 ENCOUNTER — Other Ambulatory Visit: Payer: Self-pay | Admitting: Nurse Practitioner

## 2019-03-07 DIAGNOSIS — E039 Hypothyroidism, unspecified: Secondary | ICD-10-CM | POA: Insufficient documentation

## 2019-03-07 DIAGNOSIS — R059 Cough, unspecified: Secondary | ICD-10-CM

## 2019-03-07 DIAGNOSIS — Z87891 Personal history of nicotine dependence: Secondary | ICD-10-CM | POA: Insufficient documentation

## 2019-03-07 DIAGNOSIS — M542 Cervicalgia: Secondary | ICD-10-CM | POA: Insufficient documentation

## 2019-03-07 DIAGNOSIS — E876 Hypokalemia: Secondary | ICD-10-CM

## 2019-03-07 DIAGNOSIS — R05 Cough: Secondary | ICD-10-CM | POA: Insufficient documentation

## 2019-03-07 DIAGNOSIS — Z79899 Other long term (current) drug therapy: Secondary | ICD-10-CM | POA: Insufficient documentation

## 2019-03-07 LAB — CBC WITH DIFFERENTIAL/PLATELET
Abs Immature Granulocytes: 0.12 10*3/uL — ABNORMAL HIGH (ref 0.00–0.07)
Basophils Absolute: 0 10*3/uL (ref 0.0–0.1)
Basophils Relative: 0 %
Eosinophils Absolute: 0 10*3/uL (ref 0.0–0.5)
Eosinophils Relative: 0 %
HCT: 39.9 % (ref 36.0–46.0)
Hemoglobin: 13.9 g/dL (ref 12.0–15.0)
Immature Granulocytes: 1 %
Lymphocytes Relative: 25 %
Lymphs Abs: 4.8 10*3/uL — ABNORMAL HIGH (ref 0.7–4.0)
MCH: 31.3 pg (ref 26.0–34.0)
MCHC: 34.8 g/dL (ref 30.0–36.0)
MCV: 89.9 fL (ref 80.0–100.0)
Monocytes Absolute: 1.2 10*3/uL — ABNORMAL HIGH (ref 0.1–1.0)
Monocytes Relative: 7 %
Neutro Abs: 12.7 10*3/uL — ABNORMAL HIGH (ref 1.7–7.7)
Neutrophils Relative %: 67 %
Platelets: 303 10*3/uL (ref 150–400)
RBC: 4.44 MIL/uL (ref 3.87–5.11)
RDW: 12.4 % (ref 11.5–15.5)
WBC: 18.9 10*3/uL — ABNORMAL HIGH (ref 4.0–10.5)
nRBC: 0 % (ref 0.0–0.2)

## 2019-03-07 LAB — COMPREHENSIVE METABOLIC PANEL
ALT: 12 U/L (ref 0–44)
AST: 14 U/L — ABNORMAL LOW (ref 15–41)
Albumin: 3.7 g/dL (ref 3.5–5.0)
Alkaline Phosphatase: 61 U/L (ref 38–126)
Anion gap: 10 (ref 5–15)
BUN: 9 mg/dL (ref 6–20)
CO2: 29 mmol/L (ref 22–32)
Calcium: 8.5 mg/dL — ABNORMAL LOW (ref 8.9–10.3)
Chloride: 100 mmol/L (ref 98–111)
Creatinine, Ser: 0.81 mg/dL (ref 0.44–1.00)
GFR calc Af Amer: >60 mL/min (ref >60)
GFR calc non Af Amer: >60 mL/min (ref >60)
Glucose, Bld: 96 mg/dL (ref 70–99)
Potassium: 2.9 mmol/L — ABNORMAL LOW (ref 3.5–5.1)
Sodium: 139 mmol/L (ref 135–145)
Total Bilirubin: 0.6 mg/dL (ref 0.3–1.2)
Total Protein: 6.6 g/dL (ref 6.5–8.1)

## 2019-03-07 LAB — FIBRIN DERIVATIVES D-DIMER (ARMC ONLY): Fibrin derivatives D-dimer (ARMC): 436.55 ng/mL (FEU) (ref 0.00–499.00)

## 2019-03-07 MED ORDER — ALBUTEROL SULFATE HFA 108 (90 BASE) MCG/ACT IN AERS: 2.0000 | INHALATION_SPRAY | Freq: Once | RESPIRATORY_TRACT | Status: DC

## 2019-03-07 MED ORDER — AZITHROMYCIN 500 MG PO TABS: 500.0000 mg | ORAL_TABLET | Freq: Every day | ORAL | 0 refills | Status: AC

## 2019-03-07 MED ORDER — AZITHROMYCIN 500 MG PO TABS
500.0000 mg | ORAL_TABLET | Freq: Once | ORAL | Status: DC
  Filled 2019-03-07: qty 1

## 2019-03-07 MED ORDER — IPRATROPIUM-ALBUTEROL 0.5-2.5 (3) MG/3ML IN SOLN
3.0000 mL | Freq: Once | RESPIRATORY_TRACT | Status: AC
  Administered 2019-03-07: 3 mL via RESPIRATORY_TRACT
  Filled 2019-03-07: qty 3

## 2019-03-07 MED ORDER — POTASSIUM CHLORIDE ER 10 MEQ PO TBCR: 20.0000 meq | EXTENDED_RELEASE_TABLET | Freq: Every day | ORAL | 0 refills | Status: DC

## 2019-03-07 MED ORDER — LIDOCAINE 5 % EX PTCH
1.0000 | MEDICATED_PATCH | CUTANEOUS | Status: DC
  Filled 2019-03-07: qty 1

## 2019-03-07 MED ORDER — DIAZEPAM 5 MG PO TABS
5.0000 mg | ORAL_TABLET | Freq: Once | ORAL | Status: AC
  Administered 2019-03-07: 5 mg via ORAL
  Filled 2019-03-07: qty 1

## 2019-03-07 MED ORDER — AEROCHAMBER PLUS FLO-VU MISC: 1.0000 | Freq: Once | Status: DC

## 2019-03-07 NOTE — ED Provider Notes (Signed)
[facility] Emergency Department Provider Note    First MD Initiated Contact with Patient 03/07/19 0121     (approximate)  I have reviewed the triage vital signs and the nursing notes.   HISTORY  Chief Complaint Leg Swelling and Neck Pain    HPI Renee Benjamin is a 38 y.o. female   extensive past medical history as listed below presents the ER for evaluation of persistent shortness of breath now developing leg swelling as well as some discomfort in her neck and exertional dyspnea.  She has been given 2 courses of steroids and albuterol treatment.  States that she initially felt better but is having worsening symptoms.  No fevers or cough.  Presented tonight because she feels like her legs are swelling with worsening exertional dyspnea.  Denies any history of DVT.  Not currently on any birth control.  Denies any nausea or vomiting.  No numbness or tingling.   Past Medical History:  Diagnosis Date  . ADHD (attention deficit hyperactivity disorder) 01/09/2016  . Allergy   . Anemia   . Anxiety   . Anxiety    separation anxiety  . Asthma   . Chronic right hip pain 04/10/2016  . Collagen vascular disease ([place])   . Endometriosis   . Fibromyalgia 01/09/2016  . Headache    otc med prn  . Hypothyroidism   . Plantar fasciitis of left foot 08.29.14   PLANTAR HEEL ,FLUID AROUND MEDIAL BAND  . Polycystic ovarian disease   . Polysubstance abuse ([place]) 01/22/2018   ER visit March 2019: Drug screen is positive for marijuana, cocaine, benzodiazepines, barbiturates, and opiates  . Stress fracture 08.15.14   RIGHT   . Thoracic back pain   . Thyroid disease   . Vitamin D deficiency disease    Family History  Problem Relation Age of Onset  . Diabetes Father   . Osteoporosis Mother   . Cancer Maternal Grandmother        breast  . Osteoporosis Maternal Grandmother    Past Surgical History:  Procedure Laterality Date  . ABDOMINAL HYSTERECTOMY  March 2016   Due  to uterus being stiched into C Section incision  . APPENDECTOMY    . APPENDECTOMY  2008  . CESAREAN SECTION    . CESAREAN SECTION  2014  . LAPAROSCOPIC BILATERAL SALPINGECTOMY Bilateral 01/02/2015   Procedure: LAPAROSCOPIC BILATERAL SALPINGECTOMY;  Surgeon: Osborne Oman, MD;  Location: [facility];  Service: Gynecology;  Laterality: Bilateral;  . LAPAROSCOPIC HYSTERECTOMY N/A 01/02/2015   Procedure: HYSTERECTOMY TOTAL LAPAROSCOPIC;  Surgeon: Osborne Oman, MD;  Location: [facility];  Service: Gynecology;  Laterality: N/A;  . SHOULDER SURGERY Right   . SHOULDER SURGERY  2001   right   Patient Active Problem List   Diagnosis Date Noted  . Pancreatitis 08/20/2018  . Right upper quadrant abdominal pain   . Polysubstance abuse ([place]) 01/22/2018  . Moderate recurrent major depression ([place]) 01/21/2018  . Cocaine abuse ([place]) 01/21/2018  . Cannabis abuse 01/21/2018  . Benzodiazepine abuse ([place]) 01/21/2018  . Barbiturate abuse ([place]) 01/21/2018  . Opiate abuse, episodic ([place]) 01/21/2018  . Allergic rhinitis 02/24/2017  . Vitamin B12 deficiency 02/24/2017  . Chronic right hip pain 04/10/2016  . Controlled substance agreement signed 02/01/2016  . Obesity 02/01/2016  . ADHD (attention deficit hyperactivity disorder) 01/09/2016  . Fibromyalgia 01/09/2016  . Torticollis, acute 10/08/2015  . Headache 10/02/2015  . Thoracic back pain 10/02/2015  . Lumbar pain 10/02/2015  .  Arthralgia 10/02/2015  . Chronic tonsillitis 08/20/2015  . Migraine headache with aura 04/10/2015  . Hypothyroidism 04/10/2015  . PTSD (post-traumatic stress disorder) 04/10/2015  . Status post laparoscopic hysterectomy 01/03/2015  . Chronic female pelvic pain 11/23/2014  . Plantar fasciitis of left foot   . Stress fracture   . Asthma 12/16/2012  . Bipolar affective disorder, depressed, moderate ([place]) 12/16/2012      Prior to Admission medications   Medication Sig Start Date End Date Taking? Authorizing Provider   amphetamine-dextroamphetamine (ADDERALL) 20 MG tablet Take 20 mg by mouth 2 (two) times daily. 01/26/19   [provider]  ARIPiprazole (ABILIFY) 5 MG tablet Take 5 mg by mouth at bedtime. 03/09/18   [provider]  azithromycin (ZITHROMAX) 500 MG tablet Take 1 tablet (500 mg total) by mouth daily for 3 days. Take 1 tablet daily for 3 days. 03/07/19 03/10/19  Merlyn Lot, MD  budesonide-formoterol (SYMBICORT) 80-4.5 MCG/ACT inhaler Inhale 2 puffs into the lungs 2 (two) times daily. 06/21/18   Lada, Satira Anis, MD  dextromethorphan-guaiFENesin (MUCINEX DM) 30-600 MG 12hr tablet Take 1 tablet by mouth 2 (two) times daily. 03/01/19   Poulose, Bethel Born, NP  dicyclomine (BENTYL) 10 MG capsule Take 1 capsule (10 mg total) by mouth 3 (three) times daily as needed for up to 5 days (abdominal cramping). Patient not taking: Reported on 03/01/2019 12/22/18 12/27/18  Arta Silence, MD  fexofenadine (ALLEGRA) 180 MG tablet Take 180 mg by mouth daily.    [provider]  hydrOXYzine (ATARAX/VISTARIL) 25 MG tablet Take 1-2 tablets (25-50 mg total) by mouth 4 (four) times daily as needed for anxiety. 12/22/18   Arta Silence, MD  levothyroxine (SYNTHROID, LEVOTHROID) 75 MCG tablet TAKE 1 TABLET BY MOUTH ONCE DAILY ON AN EMPTY STOMACH. WAIT 30 MINUTES BEFORE TAKING OTHER MEDS. 09/03/18   Poulose, Bethel Born, NP  lisdexamfetamine (VYVANSE) 30 MG capsule Take 30 mg by mouth daily.    [provider]  montelukast (SINGULAIR) 10 MG tablet TAKE 1 TABLET BY MOUTH EVERY DAY 07/14/18   Lada, Satira Anis, MD  ondansetron (ZOFRAN ODT) 8 MG disintegrating tablet Take 1 tablet (8 mg total) by mouth every 8 (eight) hours as needed for nausea or vomiting. Patient not taking: Reported on 02/25/2019 12/22/18   Arta Silence, MD  ondansetron (ZOFRAN) 4 MG tablet Take 1 tablet (4 mg total) by mouth every 8 (eight) hours as needed for nausea or vomiting. Patient not taking: Reported on  02/25/2019 08/19/18   Lisa Roca, MD  pantoprazole (PROTONIX) 40 MG tablet Take 1 tablet (40 mg total) by mouth daily. 08/21/18 03/01/19  Henreitta Leber, MD  predniSONE (STERAPRED UNI-PAK 48 TAB) 10 MG (48) TBPK tablet Take as directed 03/01/19   Poulose, Bethel Born, NP  PROAIR HFA 108 (90 Base) MCG/ACT inhaler INHALE 2 PUFFS BY MOUTH EVERY 6 HOURS IFNEEDED FOR WHEEZING OR SHORTNESS OF BREATH. Patient taking differently: Inhale 2 puffs into the lungs every 6 (six) hours as needed for wheezing or shortness of breath.  02/23/18   Poulose, Bethel Born, NP  QUEtiapine (SEROQUEL) 100 MG tablet Take 100 mg by mouth at bedtime.  03/09/18   [provider]    Allergies Depakote [divalproex sodium]; Morphine; Propoxyphene; Tramadol; Tramadol; Darvocet [propoxyphene n-acetaminophen]; and Valproic acid    Social History Social History   Tobacco Use  . Smoking status: Former Smoker    Packs/day: 0.25    Years: 4.00    Pack years: 1.00  Types: Cigarettes    Last attempt to quit: 04/17/2012    Years since quitting: 6.8  . Smokeless tobacco: Never Used  Substance Use Topics  . Alcohol use: No    Alcohol/week: 0.0 standard drinks  . Drug use: No    Review of Systems Patient denies headaches, rhinorrhea, blurry vision, numbness, shortness of breath, chest pain, edema, cough, abdominal pain, nausea, vomiting, diarrhea, dysuria, fevers, rashes or hallucinations unless otherwise stated above in HPI. ____________________________________________   PHYSICAL EXAM:  VITAL SIGNS: Vitals:   03/07/19 0114  BP: (!) 132/101  Pulse: (!) 110  Resp: 17  Temp: 98.2 F (36.8 C)  SpO2: 98%    Constitutional: Alert and oriented.  Eyes: Conjunctivae are normal.  Head: Atraumatic. Nose: No congestion/rhinnorhea. Mouth/Throat: Mucous membranes are moist.   Neck: No stridor. Painless ROM. ttp along left trapezius muscle Cardiovascular: Normal rate, regular rhythm. Grossly normal heart sounds.   Good peripheral circulation. Respiratory: Normal respiratory effort.  No retractions. Lungs with coarse wheeze throughout Gastrointestinal: Soft and nontender. No distention. No abdominal bruits. No CVA tenderness. Genitourinary:  Musculoskeletal: No lower extremity tenderness nor edema.  No joint effusions. Neurologic:  Normal speech and language. No gross focal neurologic deficits are appreciated. No facial droop Skin:  Skin is warm, dry and intact. No rash noted. Psychiatric: Mood and affect are normal. Speech and behavior are normal.  ____________________________________________   LABS (all labs ordered are listed, but only abnormal results are displayed)  Results for orders placed or performed during the hospital encounter of 03/07/19 (from the past 24 hour(s))  CBC with Differential/Platelet     Status: Abnormal   Collection Time: 03/07/19  3:00 AM  Result Value Ref Range   WBC 18.9 (H) 4.0 - 10.5 K/uL   RBC 4.44 3.87 - 5.11 MIL/uL   Hemoglobin 13.9 12.0 - 15.0 g/dL   HCT 39.9 36.0 - 46.0 %   MCV 89.9 80.0 - 100.0 fL   MCH 31.3 26.0 - 34.0 pg   MCHC 34.8 30.0 - 36.0 g/dL   RDW 12.4 11.5 - 15.5 %   Platelets 303 150 - 400 K/uL   nRBC 0.0 0.0 - 0.2 %   Neutrophils Relative % 67 %   Neutro Abs 12.7 (H) 1.7 - 7.7 K/uL   Lymphocytes Relative 25 %   Lymphs Abs 4.8 (H) 0.7 - 4.0 K/uL   Monocytes Relative 7 %   Monocytes Absolute 1.2 (H) 0.1 - 1.0 K/uL   Eosinophils Relative 0 %   Eosinophils Absolute 0.0 0.0 - 0.5 K/uL   Basophils Relative 0 %   Basophils Absolute 0.0 0.0 - 0.1 K/uL   Immature Granulocytes 1 %   Abs Immature Granulocytes 0.12 (H) 0.00 - 0.07 K/uL  Comprehensive metabolic panel     Status: Abnormal   Collection Time: 03/07/19  3:00 AM  Result Value Ref Range   Sodium 139 135 - 145 mmol/L   Potassium 2.9 (L) 3.5 - 5.1 mmol/L   Chloride 100 98 - 111 mmol/L   CO2 29 22 - 32 mmol/L   Glucose, Bld 96 70 - 99 mg/dL   BUN 9 6 - 20 mg/dL   Creatinine, Ser 0.81  0.44 - 1.00 mg/dL   Calcium 8.5 (L) 8.9 - 10.3 mg/dL   Total Protein 6.6 6.5 - 8.1 g/dL   Albumin 3.7 3.5 - 5.0 g/dL   AST 14 (L) 15 - 41 U/L   ALT 12 0 - 44 U/L   Alkaline  Phosphatase 61 38 - 126 U/L   Total Bilirubin 0.6 0.3 - 1.2 mg/dL   GFR calc non Af Amer >60 >60 mL/min   GFR calc Af Amer >60 >60 mL/min   Anion gap 10 5 - 15  Fibrin derivatives D-Dimer ([facility] only)     Status: None   Collection Time: 03/07/19  3:34 AM  Result Value Ref Range   Fibrin derivatives D-dimer ([facility]) 436.55 0.00 - 499.00 ng/mL (FEU)   ____________________________________________ ____________________________________________  RADIOLOGY  I personally reviewed all radiographic images ordered to evaluate for the above acute complaints and reviewed radiology reports and findings.  These findings were personally discussed with the patient.  Please see medical record for radiology report.  ____________________________________________   PROCEDURES  Procedure(s) performed:  Procedures    Critical Care performed: no ____________________________________________   INITIAL IMPRESSION / ASSESSMENT AND PLAN / ED COURSE  Pertinent labs & imaging results that were available during my care of the patient were reviewed by me and considered in my medical decision making (see chart for details).   DDX: Bronchitis, COPD, pneumonia, PE, DVT, anemia  Renee Benjamin is a 38 y.o. who presents to the ED with symptoms as described above.  Patient well-appearing nontoxic.  Does have a thrill consistent with chronic bronchitis.  No respiratory distress.  Doubt pneumonia.  Patient primarily concerned about swelling her legs.  No numbness or tingling.  Not consistent with dissection.  Low suspicion for DVT but will further stratify with d-dimer lower extremity ultrasound.  No evidence of congestive heart failure.  Patient tested recently for COVID-19 which was negative.  Have low suspicion for coronavirus infection.   Clinical Course as of Mar 07 411  Mon Mar 07, 2019  0326 Patient does have mild leukocytosis but has recently been on 2 courses of steroids.   [PR]  0408 D-dimer is negative.  Given her cough duration of symptoms we will give Z-Pak.  Pain improved.  Will also give Lidoderm patch for some neck discomfort.  Remains hemodynamically stable.  No hypoxia.  Discussed signs and symptoms for which she should return to the ER.  Have discussed with the patient and available family all diagnostics and treatments performed thus far and all questions were answered to the best of my ability. The patient demonstrates understanding and agreement with plan.    [PR]    Clinical Course User Index [PR] Merlyn Lot, MD    The patient was evaluated in Emergency Department today for the symptoms described in the history of present illness. He/she was evaluated in the context of the global COVID-19 pandemic, which necessitated consideration that the patient might be at risk for infection with the SARS-CoV-2 virus that causes COVID-19. Institutional protocols and algorithms that pertain to the evaluation of patients at risk for COVID-19 are in a state of rapid change based on information released by regulatory bodies including the CDC and federal and state organizations. These policies and algorithms were followed during the patient's care in the ED.  As part of my medical decision making, I reviewed the following data within the [facility] notes reviewed and incorporated, Labs reviewed, notes from prior ED visits and [place] Controlled Substance Database   ____________________________________________   FINAL CLINICAL IMPRESSION(S) / ED DIAGNOSES  Final diagnoses:  Cough  Neck pain      NEW MEDICATIONS STARTED DURING THIS VISIT:  New Prescriptions   AZITHROMYCIN (ZITHROMAX) 500 MG TABLET    Take 1 tablet (500  mg total) by mouth daily for 3 days. Take 1 tablet daily for 3 days.      Note:  This document was prepared using Dragon voice recognition software and may include unintentional dictation errors.    Merlyn Lot, MD 03/07/19 [phone]

## 2019-03-07 NOTE — ED Triage Notes (Addendum)
Pt was seen here on 4/23 with flu-like symptoms; tested for Covid 19 and was negative; pt says she was put on prednisone high dose for 3 days and then the taper; pt says she's now having leg swelling and neck swelling; minimal swelling noted; also having left sided neck pain; can look down but pain increases; neck pain and headache started today; talking in complete coherent sentences; denies shortness of breath; very talkative in triage

## 2019-03-08 ENCOUNTER — Other Ambulatory Visit: Payer: Self-pay

## 2019-03-08 ENCOUNTER — Ambulatory Visit: Payer: Self-pay | Admitting: *Deleted

## 2019-03-08 ENCOUNTER — Emergency Department
Admission: EM | Admit: 2019-03-08 | Discharge: 2019-03-08 | Disposition: A | Discharge status: Home / Self Care | Payer: Medicaid Other | Attending: Emergency Medicine | Admitting: Emergency Medicine

## 2019-03-08 DIAGNOSIS — E039 Hypothyroidism, unspecified: Secondary | ICD-10-CM | POA: Insufficient documentation

## 2019-03-08 DIAGNOSIS — J45909 Unspecified asthma, uncomplicated: Secondary | ICD-10-CM | POA: Insufficient documentation

## 2019-03-08 DIAGNOSIS — Z79899 Other long term (current) drug therapy: Secondary | ICD-10-CM | POA: Insufficient documentation

## 2019-03-08 DIAGNOSIS — F4310 Post-traumatic stress disorder, unspecified: Secondary | ICD-10-CM

## 2019-03-08 DIAGNOSIS — F909 Attention-deficit hyperactivity disorder, unspecified type: Secondary | ICD-10-CM

## 2019-03-08 DIAGNOSIS — M797 Fibromyalgia: Secondary | ICD-10-CM

## 2019-03-08 DIAGNOSIS — F419 Anxiety disorder, unspecified: Secondary | ICD-10-CM | POA: Insufficient documentation

## 2019-03-08 DIAGNOSIS — F1410 Cocaine abuse, uncomplicated: Secondary | ICD-10-CM

## 2019-03-08 DIAGNOSIS — Z87891 Personal history of nicotine dependence: Secondary | ICD-10-CM | POA: Insufficient documentation

## 2019-03-08 DIAGNOSIS — R4182 Altered mental status, unspecified: Secondary | ICD-10-CM

## 2019-03-08 DIAGNOSIS — F1910 Other psychoactive substance abuse, uncomplicated: Secondary | ICD-10-CM

## 2019-03-08 LAB — COMPREHENSIVE METABOLIC PANEL
ALT: 12 U/L (ref 0–44)
AST: 13 U/L — ABNORMAL LOW (ref 15–41)
Albumin: 4.1 g/dL (ref 3.5–5.0)
Alkaline Phosphatase: 72 U/L (ref 38–126)
Anion gap: 11 (ref 5–15)
BUN: 11 mg/dL (ref 6–20)
CO2: 24 mmol/L (ref 22–32)
Calcium: 8.8 mg/dL — ABNORMAL LOW (ref 8.9–10.3)
Chloride: 104 mmol/L (ref 98–111)
Creatinine, Ser: 0.83 mg/dL (ref 0.44–1.00)
GFR calc Af Amer: >60 mL/min (ref >60)
GFR calc non Af Amer: >60 mL/min (ref >60)
Glucose, Bld: 185 mg/dL — ABNORMAL HIGH (ref 70–99)
Potassium: 3.5 mmol/L (ref 3.5–5.1)
Sodium: 139 mmol/L (ref 135–145)
Total Bilirubin: 0.5 mg/dL (ref 0.3–1.2)
Total Protein: 7.3 g/dL (ref 6.5–8.1)

## 2019-03-08 LAB — URINE DRUG SCREEN, QUALITATIVE (ARMC ONLY)
Amphetamines, Ur Screen: POSITIVE — AB
Barbiturates, Ur Screen: NOT DETECTED
Benzodiazepine, Ur Scrn: NOT DETECTED
Cannabinoid 50 Ng, Ur ~~LOC~~: POSITIVE — AB
Cocaine Metabolite,Ur ~~LOC~~: POSITIVE — AB
MDMA (Ecstasy)Ur Screen: NOT DETECTED
Methadone Scn, Ur: NOT DETECTED
Opiate, Ur Screen: NOT DETECTED
Phencyclidine (PCP) Ur S: NOT DETECTED
Tricyclic, Ur Screen: NOT DETECTED

## 2019-03-08 LAB — URINALYSIS, COMPLETE (UACMP) WITH MICROSCOPIC
Bacteria, UA: NONE SEEN
Bilirubin Urine: NEGATIVE
Glucose, UA: NEGATIVE mg/dL
Hgb urine dipstick: NEGATIVE
Ketones, ur: NEGATIVE mg/dL
Leukocytes,Ua: NEGATIVE
Nitrite: NEGATIVE
Protein, ur: NEGATIVE mg/dL
Specific Gravity, Urine: 1.005 (ref 1.005–1.030)
pH: 7 (ref 5.0–8.0)

## 2019-03-08 LAB — CBC WITH DIFFERENTIAL/PLATELET
Basophils Absolute: 0 10*3/uL (ref 0.0–0.1)
Basophils Relative: 0 %
Eosinophils Absolute: 0 10*3/uL (ref 0.0–0.5)
Eosinophils Relative: 0 %
HCT: 43.9 % (ref 36.0–46.0)
Hemoglobin: 15.1 g/dL — ABNORMAL HIGH (ref 12.0–15.0)
Lymphocytes Relative: 6 %
Lymphs Abs: 1.1 10*3/uL (ref 0.7–4.0)
MCH: 31.5 pg (ref 26.0–34.0)
MCHC: 34.4 g/dL (ref 30.0–36.0)
MCV: 91.5 fL (ref 80.0–100.0)
Monocytes Absolute: 0.4 10*3/uL (ref 0.1–1.0)
Monocytes Relative: 2 %
Neutro Abs: 15.9 10*3/uL — ABNORMAL HIGH (ref 1.7–7.7)
Neutrophils Relative %: 92 %
Platelets: 373 10*3/uL (ref 150–400)
RBC: 4.8 MIL/uL (ref 3.87–5.11)
RDW: 42.4 % — ABNORMAL HIGH (ref 11.5–15.5)
WBC: 17.6 10*3/uL — ABNORMAL HIGH (ref 4.0–10.5)
nRBC: 0 /100 WBC

## 2019-03-08 LAB — TSH: TSH: 1.547 u[IU]/mL (ref 0.350–4.500)

## 2019-03-08 LAB — FIBRIN DERIVATIVES D-DIMER (ARMC ONLY): Fibrin derivatives D-dimer (ARMC): 459.99 ng/mL (FEU) (ref 0.00–499.00)

## 2019-03-08 LAB — BRAIN NATRIURETIC PEPTIDE: B Natriuretic Peptide: 27 pg/mL (ref 0.0–100.0)

## 2019-03-08 LAB — TROPONIN I: Troponin I: <0.03 ng/mL (ref <0.03)

## 2019-03-08 MED ORDER — PROPRANOLOL HCL 20 MG PO TABS: 10.0000 mg | ORAL_TABLET | Freq: Two times a day (BID) | ORAL | Status: DC | PRN

## 2019-03-08 MED ORDER — DULOXETINE HCL 60 MG PO CPEP
60.0000 mg | ORAL_CAPSULE | Freq: Every day | ORAL | Status: DC
  Administered 2019-03-08: 60 mg via ORAL
  Filled 2019-03-08: qty 1

## 2019-03-08 MED ORDER — ARIPIPRAZOLE 10 MG PO TABS: 5.0000 mg | ORAL_TABLET | Freq: Every day | ORAL | 0 refills | Status: DC

## 2019-03-08 MED ORDER — ARIPIPRAZOLE 5 MG PO TABS: 5.0000 mg | ORAL_TABLET | Freq: Every day | ORAL | Status: DC

## 2019-03-08 MED ORDER — LORAZEPAM 2 MG/ML IJ SOLN
1.0000 mg | Freq: Once | INTRAMUSCULAR | Status: AC
  Administered 2019-03-08: 1 mg via INTRAVENOUS
  Filled 2019-03-08: qty 1

## 2019-03-08 MED ORDER — SODIUM CHLORIDE 0.9 % IV BOLUS: 1000.0000 mL | Freq: Once | INTRAVENOUS | Status: DC

## 2019-03-08 MED ORDER — ARIPIPRAZOLE 5 MG PO TABS
5.0000 mg | ORAL_TABLET | Freq: Once | ORAL | Status: AC
  Administered 2019-03-08: 5 mg via ORAL
  Filled 2019-03-08: qty 1

## 2019-03-08 MED ORDER — PROPRANOLOL HCL 10 MG PO TABS: 10.0000 mg | ORAL_TABLET | Freq: Two times a day (BID) | ORAL | 0 refills | Status: DC | PRN

## 2019-03-08 MED ORDER — HYDROXYZINE HCL 25 MG PO TABS: 25.0000 mg | ORAL_TABLET | Freq: Four times a day (QID) | ORAL | Status: DC | PRN

## 2019-03-08 MED ORDER — DULOXETINE HCL 60 MG PO CPEP: 60.0000 mg | ORAL_CAPSULE | Freq: Every day | ORAL | 0 refills | Status: DC

## 2019-03-08 NOTE — Discharge Instructions (Addendum)
Follow-up with your regular doctor.  Take the medications as prescribed.  Drink plenty of fluids.  Stop prednisone.  Return if needed.

## 2019-03-08 NOTE — Telephone Encounter (Signed)
Patient was seen at [facility] and [facility] yesterday for shortness of breath. She reports she has been on prednisone for 12 days. At [facility] she was administered Magnesium via IV fluids and sent home with a zpak per patient. She took 1st dose of zithromax 500 mg tab last night. About 4 hours after leaving [facility], she began to notice she was talking very fast and with tick-like sounds. Today, she continues to talk fast while making sounds and incomprehensive conversation. Also is having visual disturbances at this time. Stated she is no longer short of breath as she was yesterday. Advised ED visit at this time, do not drive herself. Boyfriend will drive her to [facility].Routing to PCP.  Answer Assessment - Initial Assessment Questions 1. SYMPTOMS: "Do you have any symptoms?"     Yes, talking gibberish, making sounds, speaking very fast-like almost incoherent   2. SEVERITY: If symptoms are present, ask "Are they mild, moderate or severe?"    severe  Protocols used: MEDICATION QUESTION CALL-A-AH
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
## 2019-03-08 NOTE — ED Provider Notes (Signed)
[facility] Emergency Department Provider Note  ____________________________________________   First MD Initiated Contact with Patient 03/08/19 1239     (approximate)  I have reviewed the triage vital signs and the nursing notes.   HISTORY  Chief Complaint Altered Mental Status    HPI Renee Benjamin is a 38 y.o. female presents emergency department complaining of altered mental status.  She states that she has been saying words when she did not realize she was talking.  Talking in a different language that she does not understand.  She states that she heard someone talking and realized that it was her when she was walking down the hallway at her house.  She states she went to bed normal last night.  She awoke this morning with the symptoms.  She was seen approximately a week ago for covid type symptoms and tested negative for COVID-19.  She was seen yesterday stating that she felt like she was drowning and had swelling in her lower extremities.  D-dimer at that time was negative.  Lower extremities were ultrasounded for DVTs which were negative.  She states that she took her dose from the new prescription of a Z-Pak last night.  She states she was feeling better last night before she went to bed.  She has been on high doses of prednisone since 4/23.    Past Medical History:  Diagnosis Date   ADHD (attention deficit hyperactivity disorder) 01/09/2016   Allergy    Anemia    Anxiety    Anxiety    separation anxiety   Asthma    Chronic right hip pain 04/10/2016   Collagen vascular disease ([place])    Endometriosis    Fibromyalgia 01/09/2016   Headache    otc med prn   Hypothyroidism    Plantar fasciitis of left foot 08.29.14   PLANTAR HEEL ,FLUID AROUND MEDIAL BAND   Polycystic ovarian disease    Polysubstance abuse ([place]) 01/22/2018   ER visit March 2019: Drug screen is positive for marijuana, cocaine, benzodiazepines, barbiturates, and opiates     Stress fracture 08.15.14   RIGHT    Thoracic back pain    Thyroid disease    Vitamin D deficiency disease     Patient Active Problem List   Diagnosis Date Noted   Anxiety 03/08/2019   Pancreatitis 08/20/2018   Right upper quadrant abdominal pain    Polysubstance abuse ([place]) 01/22/2018   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   Cocaine abuse ([place]) 01/21/2018   Cannabis abuse 01/21/2018   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   Barbiturate abuse ([place]) 01/21/2018   Opiate abuse, episodic ([place]) 01/21/2018   Allergic rhinitis 02/24/2017   Vitamin B12 deficiency 02/24/2017   Chronic right hip pain 04/10/2016   Controlled substance agreement signed 02/01/2016   Obesity 02/01/2016   ADHD (attention deficit hyperactivity disorder) 01/09/2016   Fibromyalgia 01/09/2016   Torticollis, acute 10/08/2015   Headache 10/02/2015   Thoracic back pain 10/02/2015   Lumbar pain 10/02/2015   Arthralgia 10/02/2015   Chronic tonsillitis 08/20/2015   Migraine headache with aura 04/10/2015   Hypothyroidism 04/10/2015   PTSD (post-traumatic stress disorder) 04/10/2015   Status post laparoscopic hysterectomy 01/03/2015   Chronic female pelvic pain 11/23/2014   Plantar fasciitis of left foot    Stress fracture    Asthma 12/16/2012   Bipolar affective disorder, depressed, moderate ([place]) 12/16/2012    Past Surgical History:  Procedure Laterality Date   ABDOMINAL HYSTERECTOMY  March 2016  Due to uterus being stiched into C Section incision   APPENDECTOMY     APPENDECTOMY  2008   CESAREAN SECTION     CESAREAN SECTION  2014   LAPAROSCOPIC BILATERAL SALPINGECTOMY Bilateral 01/02/2015   Procedure: LAPAROSCOPIC BILATERAL SALPINGECTOMY;  Surgeon: Osborne Oman, MD;  Location: [facility];  Service: Gynecology;  Laterality: Bilateral;   LAPAROSCOPIC HYSTERECTOMY N/A 01/02/2015   Procedure: HYSTERECTOMY TOTAL LAPAROSCOPIC;  Surgeon: Osborne Oman, MD;  Location: [facility];  Service: Gynecology;  Laterality: N/A;   SHOULDER SURGERY Right    SHOULDER SURGERY  2001   right    Prior to Admission medications   Medication Sig Start Date End Date Taking? Authorizing Provider  amphetamine-dextroamphetamine (ADDERALL) 20 MG tablet Take 20 mg by mouth 2 (two) times daily. 01/26/19   [provider]  ARIPiprazole (ABILIFY) 10 MG tablet Take 0.5 tablets (5 mg total) by mouth at bedtime. 03/08/19   Lavella Hammock, MD  azithromycin (ZITHROMAX) 500 MG tablet Take 1 tablet (500 mg total) by mouth daily for 3 days. Take 1 tablet daily for 3 days. 03/07/19 03/10/19  Merlyn Lot, MD  budesonide-formoterol (SYMBICORT) 80-4.5 MCG/ACT inhaler Inhale 2 puffs into the lungs 2 (two) times daily. 06/21/18   Lada, Satira Anis, MD  dextromethorphan-guaiFENesin (MUCINEX DM) 30-600 MG 12hr tablet Take 1 tablet by mouth 2 (two) times daily. 03/01/19   Poulose, Bethel Born, NP  DULoxetine (CYMBALTA) 60 MG capsule Take 1 capsule (60 mg total) by mouth daily. 03/08/19   Lavella Hammock, MD  fexofenadine (ALLEGRA) 180 MG tablet Take 180 mg by mouth daily.    [provider]  hydrOXYzine (ATARAX/VISTARIL) 25 MG tablet Take 1-2 tablets (25-50 mg total) by mouth 4 (four) times daily as needed for anxiety. 12/22/18   Arta Silence, MD  levothyroxine (SYNTHROID, LEVOTHROID) 75 MCG tablet TAKE 1 TABLET BY MOUTH ONCE DAILY ON AN EMPTY STOMACH. WAIT 30 MINUTES BEFORE TAKING OTHER MEDS. 09/03/18   Poulose, Bethel Born, NP  montelukast (SINGULAIR) 10 MG tablet TAKE 1 TABLET BY MOUTH EVERY DAY 07/14/18   Lada, Satira Anis, MD  pantoprazole (PROTONIX) 40 MG tablet Take 1 tablet (40 mg total) by mouth daily. 08/21/18 03/01/19  Henreitta Leber, MD  potassium chloride (K-DUR) 10 MEQ tablet Take 2 tablets (20 mEq total) by mouth daily. 03/07/19   Poulose, Bethel Born, NP  PROAIR HFA 108 (90 Base) MCG/ACT inhaler INHALE 2 PUFFS BY MOUTH EVERY 6 HOURS IFNEEDED FOR  WHEEZING OR SHORTNESS OF BREATH. Patient taking differently: Inhale 2 puffs into the lungs every 6 (six) hours as needed for wheezing or shortness of breath.  02/23/18   Poulose, Bethel Born, NP  propranolol (INDERAL) 10 MG tablet Take 1 tablet (10 mg total) by mouth 2 (two) times daily as needed (anxiety). 03/08/19   Lavella Hammock, MD    Allergies Depakote [divalproex sodium]; Morphine; Prednisone; Propoxyphene; Tramadol; Tramadol; Darvocet [propoxyphene n-acetaminophen]; and Valproic acid  Family History  Problem Relation Age of Onset   Diabetes Father    Osteoporosis Mother    Cancer Maternal Grandmother        breast   Osteoporosis Maternal Grandmother     Social History Social History   Tobacco Use   Smoking status: Former Smoker    Packs/day: 0.25    Years: 4.00    Pack years: 1.00    Types: Cigarettes    Last attempt to quit: 04/17/2012    Years since quitting: 6.8  Smokeless tobacco: Never Used  Substance Use Topics   Alcohol use: No    Alcohol/week: 0.0 standard drinks   Drug use: No    Review of Systems  Constitutional: No fever/chills, altered mental status Eyes: No visual changes. ENT: No sore throat. Respiratory: Denies cough, complains of shortness of breath Cardiovascular: No active chest pain Genitourinary: Negative for dysuria. Musculoskeletal: Negative for back pain. Skin: Negative for rash.    ____________________________________________   PHYSICAL EXAM:  VITAL SIGNS: ED Triage Vitals  Enc Vitals Group     BP 03/08/19 1225 (!) 133/95     Pulse Rate 03/08/19 1225 (!) 119     Resp 03/08/19 1225 17     Temp 03/08/19 1225 98.5 F (36.9 C)     Temp Source 03/08/19 1225 Oral     SpO2 03/08/19 1225 97 %     Weight 03/08/19 1226 210 lb (95.3 kg)     Height 03/08/19 1226 5\' 8"  (1.727 m)     Head Circumference --      Peak Flow --      Pain Score 03/08/19 1226 0     Pain Loc --      Pain Edu? --      Excl. in [place]? --      Constitutional: Alert and oriented. Well appearing, patient does appear to be nervous, she has pressured speech and is talking rapidly.   Eyes: Conjunctivae are normal.  No nystagmus noted Head: Atraumatic. Nose: No congestion/rhinnorhea. Mouth/Throat: Mucous membranes are moist.   Neck:  supple no lymphadenopathy noted Cardiovascular: Normal rate, regular rhythm. Heart sounds are normal Respiratory: Normal respiratory effort.  No retractions, lungs c t a  Abd: soft nontender bs normal all 4 quad GU: deferred Musculoskeletal: FROM all extremities, warm and well perfused Neurologic:  Normal speech and language.  Skin:  Skin is warm, dry and intact. No rash noted. Psychiatric: Patient is nervous.  Speech is pressured.  No slurred speech is noted. ____________________________________________   LABS (all labs ordered are listed, but only abnormal results are displayed)  Labs Reviewed  TSH  COMPREHENSIVE METABOLIC PANEL  TROPONIN I  CBC WITH DIFFERENTIAL/PLATELET  URINE DRUG SCREEN, QUALITATIVE ([facility] ONLY)  URINALYSIS, COMPLETE (UACMP) WITH MICROSCOPIC  BRAIN NATRIURETIC PEPTIDE   ____________________________________________   ____________________________________________  RADIOLOGY    ____________________________________________   PROCEDURES  Procedure(s) performed: Saline lock, normal saline 1 L   Procedures    ____________________________________________   INITIAL IMPRESSION / ASSESSMENT AND PLAN / ED COURSE  Pertinent labs & imaging results that were available during my care of the patient were reviewed by me and considered in my medical decision making (see chart for details).   Patient is 38 year old female presents emergency department complaining of altered mental status.  She has been on prednisone for several days and started Z-Pak yesterday.  Tested negative for covid on 4/23.  Physical exam patient appears very nervous.  She is talking rapidly.   Some pressured speech.  Lungs are clear to auscultation, heart sounds are normal.  Cranial nerves II through XII grossly intact  Discussed case with Dr.Veronese.  EKG and labs are ordered   CBC shows WBC of 17.5 Patient given Ativan 1 mg IV  Metabolic panel shows increased glucose at 185.  Remainder is normal.  The BNP is 27 which is normal.  UDS showing positive amphetamines, cocaine, and marijuana.  Dr. Alfred Levins consulted psychiatry.  Psychiatry in to see.  Pt was given medication. States pt is  stable to be discharged.  As part of my medical decision making, I reviewed the following data within the [place] notes reviewed and incorporated, Labs reviewed see above, Old chart reviewed, A consult was requested and obtained from this/these consultant(s) Psychiatry, Evaluated by EM attending Dr. Alfred Levins, Notes from prior ED visits and [place] Controlled Substance Database  ____________________________________________   FINAL CLINICAL IMPRESSION(S) / ED DIAGNOSES  Final diagnoses:  Altered mental status, unspecified altered mental status type  Drug abuse ([place])      NEW MEDICATIONS STARTED DURING THIS VISIT:  Current Discharge Medication List    START taking these medications   Details  DULoxetine (CYMBALTA) 60 MG capsule Take 1 capsule (60 mg total) by mouth daily. Qty: 30 capsule, Refills: 0    propranolol (INDERAL) 10 MG tablet Take 1 tablet (10 mg total) by mouth 2 (two) times daily as needed (anxiety). Qty: 30 tablet, Refills: 0         Note:  This document was prepared using Dragon voice recognition software and may include unintentional dictation errors.    Versie Starks, PA-C 03/08/19 [place], [place], MD 03/10/19 [phone]

## 2019-03-08 NOTE — Telephone Encounter (Signed)
Documentation reviewed 

## 2019-03-08 NOTE — ED Notes (Signed)
First nurse note:  Speaking in full sentences, no distress noted.

## 2019-03-08 NOTE — ED Triage Notes (Addendum)
Pt states she was placed on predisone,z-pack for URI, states she has not slept in 4 days and feels altered, states she has been talking out of her head and having a reaction to the prednisone. Pt tested negative for covid on 4/23

## 2019-03-08 NOTE — ED Notes (Signed)
psychiatry at bedside

## 2019-03-21 ENCOUNTER — Encounter: Payer: Self-pay | Admitting: Family Medicine

## 2019-03-24 ENCOUNTER — Encounter: Payer: Self-pay | Admitting: Family Medicine

## 2019-03-24 ENCOUNTER — Ambulatory Visit (INDEPENDENT_AMBULATORY_CARE_PROVIDER_SITE_OTHER): Payer: Medicaid Other | Admitting: Family Medicine

## 2019-03-24 ENCOUNTER — Other Ambulatory Visit (HOSPITAL_COMMUNITY)
Admission: RE | Admit: 2019-03-24 | Discharge: 2019-03-24 | Disposition: A | Discharge status: Home / Self Care | Payer: Medicaid Other | Source: Ambulatory Visit | Attending: Family Medicine | Admitting: Family Medicine

## 2019-03-24 ENCOUNTER — Other Ambulatory Visit: Payer: Self-pay

## 2019-03-24 VITALS — BP 114/76 | HR 88 | Temp 98.0°F | Resp 16 | Ht 68.0 in | Wt 214.7 lb

## 2019-03-24 DIAGNOSIS — R300 Dysuria: Secondary | ICD-10-CM | POA: Diagnosis not present

## 2019-03-24 DIAGNOSIS — F3160 Bipolar disorder, current episode mixed, unspecified: Secondary | ICD-10-CM

## 2019-03-24 DIAGNOSIS — N941 Unspecified dyspareunia: Secondary | ICD-10-CM | POA: Insufficient documentation

## 2019-03-24 DIAGNOSIS — N760 Acute vaginitis: Secondary | ICD-10-CM

## 2019-03-24 MED ORDER — FLUCONAZOLE 150 MG PO TABS: 150.0000 mg | ORAL_TABLET | ORAL | 0 refills | Status: DC

## 2019-03-24 NOTE — Progress Notes (Signed)
Name: Renee Benjamin   MRN: [id number]    DOB: 1981-08-11   Date:03/24/2019       Progress Note  Subjective  Chief Complaint  Chief Complaint  Patient presents with  . Dyspareunia  . Dark Spots    HPI   Vulva irritation and dysuria: she was treated with antibiotics for an URI about 2 weeks ago , she states about one week ago she noticed dysuria, no hesitancy or urinary frequency, denies hematuria. She states feels like the outside is raw and swollen, she also has pain during sex. Same sexual partner for 12 years. No change in hygiene products. No vaginal discharge or odor. She is status post- hysterectomy from complications of cesarean surgery.   Bipolar mixed: she is under the care of [facility] providers - and is on medication. She is compliant, she states mood goes up and down but stable.   Patient Active Problem List   Diagnosis Date Noted  . Bipolar affective, mixed ([place]) 03/24/2019  . Anxiety 03/08/2019  . Pancreatitis 08/20/2018  . Right upper quadrant abdominal pain   . Polysubstance abuse ([place]) 01/22/2018  . Moderate recurrent major depression ([place]) 01/21/2018  . Cocaine abuse ([place]) 01/21/2018  . Cannabis abuse 01/21/2018  . Benzodiazepine abuse ([place]) 01/21/2018  . Barbiturate abuse ([place]) 01/21/2018  . Opiate abuse, episodic ([place]) 01/21/2018  . Allergic rhinitis 02/24/2017  . Vitamin B12 deficiency 02/24/2017  . Chronic right hip pain 04/10/2016  . Controlled substance agreement signed 02/01/2016  . Obesity 02/01/2016  . ADHD (attention deficit hyperactivity disorder) 01/09/2016  . Fibromyalgia 01/09/2016  . Torticollis, acute 10/08/2015  . Headache 10/02/2015  . Thoracic back pain 10/02/2015  . Lumbar pain 10/02/2015  . Arthralgia 10/02/2015  . Chronic tonsillitis 08/20/2015  . Migraine headache with aura 04/10/2015  . Hypothyroidism 04/10/2015  . PTSD (post-traumatic stress disorder) 04/10/2015  . Status post laparoscopic hysterectomy 01/03/2015  . Chronic  female pelvic pain 11/23/2014  . Plantar fasciitis of left foot   . Stress fracture   . Asthma 12/16/2012  . Bipolar affective disorder, depressed, moderate ([place]) 12/16/2012    Past Surgical History:  Procedure Laterality Date  . ABDOMINAL HYSTERECTOMY  March 2016   Due to uterus being stiched into C Section incision  . APPENDECTOMY    . APPENDECTOMY  2008  . CESAREAN SECTION    . CESAREAN SECTION  2014  . LAPAROSCOPIC BILATERAL SALPINGECTOMY Bilateral 01/02/2015   Procedure: LAPAROSCOPIC BILATERAL SALPINGECTOMY;  Surgeon: Osborne Oman, MD;  Location: [facility];  Service: Gynecology;  Laterality: Bilateral;  . LAPAROSCOPIC HYSTERECTOMY N/A 01/02/2015   Procedure: HYSTERECTOMY TOTAL LAPAROSCOPIC;  Surgeon: Osborne Oman, MD;  Location: [facility];  Service: Gynecology;  Laterality: N/A;  . SHOULDER SURGERY Right   . SHOULDER SURGERY  2001   right    Family History  Problem Relation Age of Onset  . Diabetes Father   . Osteoporosis Mother   . Cancer Maternal Grandmother        breast  . Osteoporosis Maternal Grandmother     Social History   Socioeconomic History  . Marital status: Single    Spouse name: Not on file  . Number of children: 1  . Years of education: Not on file  . Highest education level: GED or equivalent  Occupational History  . Not on file  Social Needs  . Financial resource strain: Somewhat hard  . Food insecurity:    Worry: Never true    Inability: Never true  .  Transportation needs:    Medical: No    Non-medical: No  Tobacco Use  . Smoking status: Former Smoker    Packs/day: 0.25    Years: 4.00    Pack years: 1.00    Types: Cigarettes    Last attempt to quit: 04/17/2012    Years since quitting: 6.9  . Smokeless tobacco: Never Used  Substance and Sexual Activity  . Alcohol use: No    Alcohol/week: 0.0 standard drinks  . Drug use: No  . Sexual activity: Yes    Partners: Male    Birth control/protection: None  Lifestyle  . Physical activity:     Days per week: 5 days    Minutes per session: 60 min  . Stress: Only a little  Relationships  . Social connections:    Talks on phone: More than three times a week    Gets together: Never    Attends religious service: Never    Active member of club or organization: No    Attends meetings of clubs or organizations: Never    Relationship status: Living with partner  . Intimate partner violence:    Fear of current or ex partner: No    Emotionally abused: No    Physically abused: No    Forced sexual activity: No  Other Topics Concern  . Not on file  Social History Narrative   ** Merged History Encounter **         Current Outpatient Medications:  .  amphetamine-dextroamphetamine (ADDERALL) 20 MG tablet, Take 20 mg by mouth 2 (two) times daily., Disp: , Rfl:  .  ARIPiprazole (ABILIFY) 10 MG tablet, Take 0.5 tablets (5 mg total) by mouth at bedtime., Disp: 30 tablet, Rfl: 0 .  budesonide-formoterol (SYMBICORT) 80-4.5 MCG/ACT inhaler, Inhale 2 puffs into the lungs 2 (two) times daily., Disp: 1 Inhaler, Rfl: 3 .  fexofenadine (ALLEGRA) 180 MG tablet, Take 180 mg by mouth daily., Disp: , Rfl:  .  hydrOXYzine (ATARAX/VISTARIL) 25 MG tablet, Take 1-2 tablets (25-50 mg total) by mouth 4 (four) times daily as needed for anxiety., Disp: 30 tablet, Rfl: 0 .  levothyroxine (SYNTHROID, LEVOTHROID) 75 MCG tablet, TAKE 1 TABLET BY MOUTH ONCE DAILY ON AN EMPTY STOMACH. WAIT 30 MINUTES BEFORE TAKING OTHER MEDS., Disp: 30 tablet, Rfl: 5 .  montelukast (SINGULAIR) 10 MG tablet, TAKE 1 TABLET BY MOUTH EVERY DAY, Disp: 30 tablet, Rfl: 11 .  PROAIR HFA 108 (90 Base) MCG/ACT inhaler, INHALE 2 PUFFS BY MOUTH EVERY 6 HOURS IFNEEDED FOR WHEEZING OR SHORTNESS OF BREATH. (Patient taking differently: Inhale 2 puffs into the lungs every 6 (six) hours as needed for wheezing or shortness of breath. ), Disp: 8.5 g, Rfl: 0 .  pantoprazole (PROTONIX) 40 MG tablet, Take 1 tablet (40 mg total) by mouth daily., Disp: 30  tablet, Rfl: 1  Allergies  Allergen Reactions  . Depakote [Divalproex Sodium] Hives  . Morphine Hives  . Prednisone     Possibly mania  . Propoxyphene Other (See Comments)  . Tramadol Nausea Only  . Tramadol Nausea And Vomiting and Other (See Comments)  . Darvocet [Propoxyphene N-Acetaminophen] Rash  . Valproic Acid Rash    I personally reviewed active problem list, medication list, allergies, family history, social history with the patient/caregiver today.   ROS  Ten systems reviewed and is negative except as mentioned in HPI   Objective  Vitals:   03/24/19 0903  BP: 114/76  Pulse: 88  Resp: 16  Temp: 98 F (  36.7 C)  TempSrc: Oral  SpO2: 98%  Weight: 214 lb 11.2 oz (97.4 kg)  Height: 5\' 8"  (1.727 m)    Body mass index is 32.65 kg/m.  Physical Exam  Constitutional: Patient appears well-developed and well-nourished. Obese  No distress.  HEENT: head atraumatic, normocephalic, pupils equal and reactive to light,  neck supple, throat within normal limits Pelvic: vulva normal, some mild swelling of labia minora, no lesions, vagina had white discharge , some cottage cheese like, cervix absent, no bimanual tenderness or masses Cardiovascular: Normal rate, regular rhythm and normal heart sounds.  No murmur heard. No BLE edema. Pulmonary/Chest: Effort normal and breath sounds normal. No respiratory distress. Abdominal: Soft.  There is no tenderness. Psychiatric: Patient has a normal mood and affect. behavior is normal. Judgment and thought content normal.  Recent Results (from the past 2160 hour(s))  CBC with Differential     Status: None   Collection Time: 02/24/19  7:17 PM  Result Value Ref Range   WBC 9.1 4.0 - 10.5 K/uL   RBC 4.29 3.87 - 5.11 MIL/uL   Hemoglobin 13.5 12.0 - 15.0 g/dL   HCT 38.7 36.0 - 46.0 %   MCV 90.2 80.0 - 100.0 fL   MCH 31.5 26.0 - 34.0 pg   MCHC 34.9 30.0 - 36.0 g/dL   RDW 11.9 11.5 - 15.5 %   Platelets 261 150 - 400 K/uL   nRBC 0.0 0.0  - 0.2 %   Neutrophils Relative % 63 %   Neutro Abs 5.8 1.7 - 7.7 K/uL   Lymphocytes Relative 23 %   Lymphs Abs 2.1 0.7 - 4.0 K/uL   Monocytes Relative 11 %   Monocytes Absolute 1.0 0.1 - 1.0 K/uL   Eosinophils Relative 2 %   Eosinophils Absolute 0.2 0.0 - 0.5 K/uL   Basophils Relative 1 %   Basophils Absolute 0.1 0.0 - 0.1 K/uL   Immature Granulocytes 0 %   Abs Immature Granulocytes 0.02 0.00 - 0.07 K/uL    Comment: Performed at [facility], [address], [place], [place] [address]  SARS Coronavirus 2 [facility] order, Performed in [place] hospital lab)     Status: None   Collection Time: 02/24/19  7:17 PM  Result Value Ref Range   SARS Coronavirus 2 NEGATIVE NEGATIVE    Comment: (NOTE) If result is NEGATIVE SARS-CoV-2 target nucleic acids are NOT DETECTED. The SARS-CoV-2 RNA is generally detectable in upper and lower  respiratory specimens during the acute phase of infection. The lowest  concentration of SARS-CoV-2 viral copies this assay can detect is 250  copies / mL. A negative result does not preclude SARS-CoV-2 infection  and should not be used as the sole basis for treatment or other  patient management decisions.  A negative result may occur with  improper specimen collection / handling, submission of specimen other  than nasopharyngeal swab, presence of viral mutation(s) within the  areas targeted by this assay, and inadequate number of viral copies  (<250 copies / mL). A negative result must be combined with clinical  observations, patient history, and epidemiological information. If result is POSITIVE SARS-CoV-2 target nucleic acids are DETECTED. The SARS-CoV-2 RNA is generally detectable in upper and lower  respiratory specimens dur ing the acute phase of infection.  Positive  results are indicative of active infection with SARS-CoV-2.  Clinical  correlation with patient history and other diagnostic information is  necessary to determine patient  infection status.  Positive results do  not rule out bacterial infection or co-infection with other viruses. If result is PRESUMPTIVE POSTIVE SARS-CoV-2 nucleic acids MAY BE PRESENT.   A presumptive positive result was obtained on the submitted specimen  and confirmed on repeat testing.  While 2019 novel coronavirus  (SARS-CoV-2) nucleic acids may be present in the submitted sample  additional confirmatory testing may be necessary for epidemiological  and / or clinical management purposes  to differentiate between  SARS-CoV-2 and other Sarbecovirus currently known to infect humans.  If clinically indicated additional testing with an alternate test  methodology [phone]) is advised. The SARS-CoV-2 RNA is generally  detectable in upper and lower respiratory sp ecimens during the acute  phase of infection. The expected result is Negative. Fact Sheet for Patients:  StrictlyIdeas.no Fact Sheet for Healthcare Providers: BankingDealers.co.za This test is not yet approved or cleared by the [place] FDA and has been authorized for detection and/or diagnosis of SARS-CoV-2 by FDA under an Emergency Use Authorization (EUA).  This EUA will remain in effect (meaning this test can be used) for the duration of the COVID-19 declaration under Section 564(b)(1) of the Act, 21 U.S.C. section 360bbb-3(b)(1), unless the authorization is terminated or revoked sooner. Performed at [facility], [address] [place], [place] [address]   CBC with Differential/Platelet     Status: Abnormal   Collection Time: 03/07/19  3:00 AM  Result Value Ref Range   WBC 18.9 (H) 4.0 - 10.5 K/uL   RBC 4.44 3.87 - 5.11 MIL/uL   Hemoglobin 13.9 12.0 - 15.0 g/dL   HCT 39.9 36.0 - 46.0 %   MCV 89.9 80.0 - 100.0 fL   MCH 31.3 26.0 - 34.0 pg   MCHC 34.8 30.0 - 36.0 g/dL   RDW 12.4 11.5 - 15.5 %   Platelets 303 150 - 400 K/uL   nRBC 0.0 0.0 - 0.2 %    Neutrophils Relative % 67 %   Neutro Abs 12.7 (H) 1.7 - 7.7 K/uL   Lymphocytes Relative 25 %   Lymphs Abs 4.8 (H) 0.7 - 4.0 K/uL   Monocytes Relative 7 %   Monocytes Absolute 1.2 (H) 0.1 - 1.0 K/uL   Eosinophils Relative 0 %   Eosinophils Absolute 0.0 0.0 - 0.5 K/uL   Basophils Relative 0 %   Basophils Absolute 0.0 0.0 - 0.1 K/uL   Immature Granulocytes 1 %   Abs Immature Granulocytes 0.12 (H) 0.00 - 0.07 K/uL    Comment: Performed at [facility], [address] [place], [place] [address]  Comprehensive metabolic panel     Status: Abnormal   Collection Time: 03/07/19  3:00 AM  Result Value Ref Range   Sodium 139 135 - 145 mmol/L   Potassium 2.9 (L) 3.5 - 5.1 mmol/L   Chloride 100 98 - 111 mmol/L   CO2 29 22 - 32 mmol/L   Glucose, Bld 96 70 - 99 mg/dL   BUN 9 6 - 20 mg/dL   Creatinine, Ser 0.81 0.44 - 1.00 mg/dL   Calcium 8.5 (L) 8.9 - 10.3 mg/dL   Total Protein 6.6 6.5 - 8.1 g/dL   Albumin 3.7 3.5 - 5.0 g/dL   AST 14 (L) 15 - 41 U/L   ALT 12 0 - 44 U/L   Alkaline Phosphatase 61 38 - 126 U/L   Total Bilirubin 0.6 0.3 - 1.2 mg/dL   GFR calc non Af Amer >60 >60 mL/min   GFR calc Af Amer >60 >60 mL/min  Anion gap 10 5 - 15    Comment: Performed at [facility], [address] [place], [place] [address]  Fibrin derivatives D-Dimer [facility] only)     Status: None   Collection Time: 03/07/19  3:34 AM  Result Value Ref Range   Fibrin derivatives D-dimer ([facility]) 436.55 0.00 - 499.00 ng/mL (FEU)    Comment: (NOTE) <> Exclusion of Venous Thromboembolism (VTE) - OUTPATIENT ONLY   (Emergency Department or [place])   0-499 ng/ml (FEU): With a low to intermediate pretest probability                      for VTE this test result excludes the diagnosis                      of VTE.   >499 ng/ml (FEU) : VTE not excluded; additional work up for VTE is                      required. <> Testing on Inpatients and Evaluation of Disseminated Intravascular   Coagulation (DIC)  Reference Range:   0-499 ng/ml (FEU) Performed at [facility], [address] [place], [place] [address]   CBC with Differential     Status: Abnormal   Collection Time: 03/08/19 12:40 PM  Result Value Ref Range   WBC 17.6 (H) 4.0 - 10.5 K/uL   RBC 4.80 3.87 - 5.11 MIL/uL   Hemoglobin 15.1 (H) 12.0 - 15.0 g/dL   HCT 43.9 36.0 - 46.0 %   MCV 91.5 80.0 - 100.0 fL   MCH 31.5 26.0 - 34.0 pg   MCHC 34.4 30.0 - 36.0 g/dL   RDW 42.4 (H) 11.5 - 15.5 %   Platelets 373 150 - 400 K/uL   Neutrophils Relative % 92 %   Neutro Abs 15.9 (H) 1.7 - 7.7 K/uL   Lymphocytes Relative 6 %   Lymphs Abs 1.1 0.7 - 4.0 K/uL   Monocytes Relative 2 %   Monocytes Absolute 0.4 0.1 - 1.0 K/uL   Eosinophils Relative 0 %   Eosinophils Absolute 0.0 0.0 - 0.5 K/uL   Basophils Relative 0 %   Basophils Absolute 0.0 0.0 - 0.1 K/uL   nRBC 0 0 /100 WBC    Comment: Performed at [facility], [address] [place], [place] [address]  Brain natriuretic peptide     Status: None   Collection Time: 03/08/19 12:58 PM  Result Value Ref Range   B Natriuretic Peptide 27.0 0.0 - 100.0 pg/mL    Comment: Performed at [facility], [address] [place], [place] [address]  Comprehensive metabolic panel     Status: Abnormal   Collection Time: 03/08/19  1:15 PM  Result Value Ref Range   Sodium 139 135 - 145 mmol/L   Potassium 3.5 3.5 - 5.1 mmol/L   Chloride 104 98 - 111 mmol/L   CO2 24 22 - 32 mmol/L   Glucose, Bld 185 (H) 70 - 99 mg/dL   BUN 11 6 - 20 mg/dL   Creatinine, Ser 0.83 0.44 - 1.00 mg/dL   Calcium 8.8 (L) 8.9 - 10.3 mg/dL   Total Protein 7.3 6.5 - 8.1 g/dL   Albumin 4.1 3.5 - 5.0 g/dL   AST 13 (L) 15 - 41 U/L   ALT 12 0 - 44 U/L   Alkaline Phosphatase 72 38 - 126 U/L   Total Bilirubin 0.5 0.3 -  1.2 mg/dL   GFR calc non Af Amer >60 >60 mL/min   GFR calc Af Amer >60 >60 mL/min   Anion gap 11 5 - 15    Comment: Performed at [facility], [address]  [place], [place] [address]  Troponin I -     Status: None   Collection Time: 03/08/19  1:15 PM  Result Value Ref Range   Troponin I <0.03 <0.03 ng/mL    Comment: Performed at [facility], [address] [place], [place] [address]  Urinalysis, Complete w Microscopic     Status: Abnormal   Collection Time: 03/08/19  1:15 PM  Result Value Ref Range   Color, Urine STRAW (A) YELLOW   APPearance HAZY (A) CLEAR   Specific Gravity, Urine 1.005 1.005 - 1.030   pH 7.0 5.0 - 8.0   Glucose, UA NEGATIVE NEGATIVE mg/dL   Hgb urine dipstick NEGATIVE NEGATIVE   Bilirubin Urine NEGATIVE NEGATIVE   Ketones, ur NEGATIVE NEGATIVE mg/dL   Protein, ur NEGATIVE NEGATIVE mg/dL   Nitrite NEGATIVE NEGATIVE   Leukocytes,Ua NEGATIVE NEGATIVE   RBC / HPF 0-5 0 - 5 RBC/hpf   WBC, UA 0-5 0 - 5 WBC/hpf   Bacteria, UA NONE SEEN NONE SEEN   Squamous Epithelial / LPF 11-20 0 - 5   Mucus PRESENT     Comment: Performed at [facility], [address], [place], [place] [address]  Urine Drug Screen, Qualitative ([facility] only)     Status: Abnormal   Collection Time: 03/08/19  1:15 PM  Result Value Ref Range   Tricyclic, Ur Screen NONE DETECTED NONE DETECTED   Amphetamines, Ur Screen POSITIVE (A) NONE DETECTED   MDMA (Ecstasy)Ur Screen NONE DETECTED NONE DETECTED   Cocaine Metabolite,Ur [place] POSITIVE (A) NONE DETECTED   Opiate, Ur Screen NONE DETECTED NONE DETECTED   Phencyclidine (PCP) Ur S NONE DETECTED NONE DETECTED   Cannabinoid 50 Ng, Ur [place] POSITIVE (A) NONE DETECTED   Barbiturates, Ur Screen NONE DETECTED NONE DETECTED   Benzodiazepine, Ur Scrn NONE DETECTED NONE DETECTED   Methadone Scn, Ur NONE DETECTED NONE DETECTED    Comment: (NOTE) Tricyclics + metabolites, urine    Cutoff 1000 ng/mL Amphetamines + metabolites, urine  Cutoff 1000 ng/mL MDMA (Ecstasy), urine              Cutoff 500 ng/mL Cocaine Metabolite, urine          Cutoff 300 ng/mL Opiate + metabolites, urine        Cutoff 300  ng/mL Phencyclidine (PCP), urine         Cutoff 25 ng/mL Cannabinoid, urine                 Cutoff 50 ng/mL Barbiturates + metabolites, urine  Cutoff 200 ng/mL Benzodiazepine, urine              Cutoff 200 ng/mL Methadone, urine                   Cutoff 300 ng/mL The urine drug screen provides only a preliminary, unconfirmed analytical test result and should not be used for non-medical purposes. Clinical consideration and professional judgment should be applied to any positive drug screen result due to possible interfering substances. A more specific alternate chemical method must be used in order to obtain a confirmed analytical result. Gas chromatography / mass spectrometry (GC/MS) is the preferred confirmat ory method. Performed at [facility], [address] [place],  [place] [address]   Fibrin derivatives D-Dimer ([facility] only)     Status: None   Collection Time: 03/08/19  1:15 PM  Result Value Ref Range   Fibrin derivatives D-dimer ([facility]) TEST WILL BE CREDITED 0.00 - 499.00 ng/mL (FEU)    Comment: NOTIFIED JAMIE, RN CHARGE NURSE OF CORRECTED PATIENT RESULTS. ERROR OCCURRED DURING DOWNTIME. (NOTE) <> Exclusion of Venous Thromboembolism (VTE) - OUTPATIENT ONLY   (Emergency Department or [place])   0-499 ng/ml (FEU): With a low to intermediate pretest probability                      for VTE this test result excludes the diagnosis                      of VTE.   >499 ng/ml (FEU) : VTE not excluded; additional work up for VTE is                      required. <> Testing on Inpatients and Evaluation of Disseminated Intravascular   Coagulation (DIC) Reference Range:   0-499 ng/ml (FEU) Performed at [facility], [address] [place], [place] [address] CORRECTED ON 05/05 AT 1850: PREVIOUSLY REPORTED AS 831.99   Fibrin derivatives D-Dimer ([facility] only)     Status: None   Collection Time: 03/08/19  1:15 PM  Result Value Ref Range   Fibrin derivatives D-dimer  ([facility]) 459.99 0.00 - 499.00 ng/mL (FEU)    Comment: (NOTE) <> Exclusion of Venous Thromboembolism (VTE) - OUTPATIENT ONLY   (Emergency Department or [place])   0-499 ng/ml (FEU): With a low to intermediate pretest probability                      for VTE this test result excludes the diagnosis                      of VTE.   >499 ng/ml (FEU) : VTE not excluded; additional work up for VTE is                      required. <> Testing on Inpatients and Evaluation of Disseminated Intravascular   Coagulation (DIC) Reference Range:   0-499 ng/ml (FEU) Performed at [facility], [address] [place], [place] [address] CORRECTED ON 05/05 AT 1827: PREVIOUSLY REPORTED AS 459.99   TSH     Status: None   Collection Time: 03/08/19  2:56 PM  Result Value Ref Range   TSH 1.547 0.350 - 4.500 uIU/mL    Comment: Performed by a 3rd Generation assay with a functional sensitivity of <=0.01 uIU/mL. Performed at [facility], [address] [place], [place] [address]       PHQ2/9: Depression screen [facility] 2/9 03/24/2019 03/01/2019 02/25/2019 07/20/2018 06/21/2018  Decreased Interest 0 0 - 0 0  Down, Depressed, Hopeless 0 0 - - 1  PHQ - 2 Score 0 0 - 0 1  Altered sleeping 0 0 - 0 0  Tired, decreased energy 0 0 - 0 1  Change in appetite 0 0 - 0 3  Feeling bad or failure about yourself  0 0 - 0 0  Trouble concentrating 0 0 - 0 0  Moving slowly or fidgety/restless 0 0 - 0 0  Suicidal thoughts 0 0 0 0 0  PHQ-9 Score 0 0 - 0 5  Difficult doing work/chores Not difficult  at all Not difficult at all - Not difficult at all Somewhat difficult    phq 9 is negative   Fall Risk: Fall Risk  03/24/2019 03/01/2019 02/25/2019 07/20/2018 06/21/2018  Falls in the past year? 0 0 0 No No  Number falls in past yr: 0 - 0 - -  Injury with Fall? 0 - 0 - -      Functional Status Survey: Is the patient deaf or have difficulty hearing?: No Does the patient have difficulty seeing, even when wearing  glasses/contacts?: No Does the patient have difficulty concentrating, remembering, or making decisions?: No Does the patient have difficulty walking or climbing stairs?: No Does the patient have difficulty dressing or bathing?: No Does the patient have difficulty doing errands alone such as visiting a doctor's office or shopping?: No    Assessment & Plan  1. Bipolar affective, mixed ([place])  Continue follow up with [facility]   2. Dysuria  - Cervicovaginal ancillary only - CULTURE, URINE COMPREHENSIVE  3. Dyspareunia, female  - Cervicovaginal ancillary only - CULTURE, URINE COMPREHENSIVE   4. Acute vaginitis  - fluconazole (DIFLUCAN) 150 MG tablet; Take 1 tablet (150 mg total) by mouth every other day.  Dispense: 3 tablet; Refill: 0

## 2019-03-25 LAB — CERVICOVAGINAL ANCILLARY ONLY
Bacterial vaginitis: POSITIVE — AB
Candida vaginitis: POSITIVE — AB
Chlamydia: NEGATIVE
Neisseria Gonorrhea: NEGATIVE
Trichomonas: NEGATIVE

## 2019-03-26 ENCOUNTER — Encounter: Payer: Self-pay | Admitting: Family Medicine

## 2019-03-26 DIAGNOSIS — N760 Acute vaginitis: Secondary | ICD-10-CM

## 2019-03-26 LAB — CULTURE, URINE COMPREHENSIVE
MICRO NUMBER:: 497190
SPECIMEN QUALITY:: ADEQUATE

## 2019-03-30 ENCOUNTER — Encounter: Payer: Self-pay | Admitting: Family Medicine

## 2019-06-15 ENCOUNTER — Other Ambulatory Visit: Payer: Self-pay

## 2019-06-15 ENCOUNTER — Encounter: Payer: Self-pay | Admitting: Emergency Medicine

## 2019-06-15 ENCOUNTER — Emergency Department
Admission: EM | Admit: 2019-06-15 | Discharge: 2019-06-15 | Discharge status: Against Medical Advice | Payer: Medicaid Other | Attending: Emergency Medicine | Admitting: Emergency Medicine

## 2019-06-15 DIAGNOSIS — Z5321 Procedure and treatment not carried out due to patient leaving prior to being seen by health care provider: Secondary | ICD-10-CM | POA: Insufficient documentation

## 2019-06-15 DIAGNOSIS — M542 Cervicalgia: Secondary | ICD-10-CM | POA: Insufficient documentation

## 2019-06-15 DIAGNOSIS — R0789 Other chest pain: Secondary | ICD-10-CM | POA: Insufficient documentation

## 2019-06-15 LAB — BASIC METABOLIC PANEL
Anion gap: 9 (ref 5–15)
BUN: 10 mg/dL (ref 6–20)
CO2: 23 mmol/L (ref 22–32)
Calcium: 8.9 mg/dL (ref 8.9–10.3)
Chloride: 104 mmol/L (ref 98–111)
Creatinine, Ser: 0.7 mg/dL (ref 0.44–1.00)
GFR calc Af Amer: >60 mL/min (ref >60)
GFR calc non Af Amer: >60 mL/min (ref >60)
Glucose, Bld: 111 mg/dL — ABNORMAL HIGH (ref 70–99)
Potassium: 3.1 mmol/L — ABNORMAL LOW (ref 3.5–5.1)
Sodium: 136 mmol/L (ref 135–145)

## 2019-06-15 LAB — CBC
HCT: 40.2 % (ref 36.0–46.0)
Hemoglobin: 14.2 g/dL (ref 12.0–15.0)
MCH: 31.8 pg (ref 26.0–34.0)
MCHC: 35.3 g/dL (ref 30.0–36.0)
MCV: 90.1 fL (ref 80.0–100.0)
Platelets: 313 10*3/uL (ref 150–400)
RBC: 4.46 MIL/uL (ref 3.87–5.11)
RDW: 11.9 % (ref 11.5–15.5)
WBC: 12.8 10*3/uL — ABNORMAL HIGH (ref 4.0–10.5)
nRBC: 0 % (ref 0.0–0.2)

## 2019-06-15 LAB — TROPONIN I (HIGH SENSITIVITY): Troponin I (High Sensitivity): 3 ng/L (ref <18)

## 2019-06-15 MED ORDER — SODIUM CHLORIDE 0.9% FLUSH: 3.0000 mL | Freq: Once | INTRAVENOUS | Status: DC

## 2019-06-15 NOTE — ED Triage Notes (Signed)
Pt presents to ED with upper chest pain since waking this morning. Pt states she thought she had gas initially but pain has worsened throughout the day. Pt states the pain feels like she "has air inside from having surgery". This evening the pain radiated into the left side of her neck. Pt alert and calm at this time. No increased work of breathing or acute distress noted.

## 2019-06-15 NOTE — ED Notes (Signed)
No answer when called several times from lobby 

## 2019-06-17 ENCOUNTER — Telehealth: Payer: Self-pay | Admitting: Emergency Medicine

## 2019-06-17 NOTE — Telephone Encounter (Signed)
Called patient due to lwot to inquire about condition and follow up plans. Someone answered, but cut off when I identified myself.

## 2019-07-12 ENCOUNTER — Ambulatory Visit: Payer: Self-pay | Admitting: *Deleted

## 2019-07-12 NOTE — Telephone Encounter (Signed)
Called patient, could not leave a message to have her call office. Mailbox full

## 2019-07-12 NOTE — Telephone Encounter (Signed)
Pt was out of town, had problems breathing, went to an urgent. She received an injection of a steroid and then was put on prednisone. She has been taking this for the past 3 days. She is crying, agitated, and stated ":mad at herself".  She stated if she stops taking the medication she would not be able to breath.  She has a hx of bipolar. Advised that emotional symptoms is one of the side effect of prednisone. She asked if she needed to go to the hospital, told her yes and tried to explain to her the possibility that maybe having drug interactions with her other meds. But she disconnected the call. Was not able to ask her many questions to triage her. Notified [facility] of the conversation with this patient. Will route to the practice for review.

## 2019-07-12 NOTE — Telephone Encounter (Signed)
Please call patient to check in on her.  Find out how much prednisone she is taking, how many days she has left, and if it was a taper or a set dose each day.

## 2019-07-14 MED ORDER — POLYETHYLENE GLYCOL 3350 17 G PO PACK
17.00 | PACK | ORAL | Status: DC
Start: ? — End: 2019-07-14

## 2019-07-14 MED ORDER — PREDNISONE 20 MG PO TABS
40.00 | ORAL_TABLET | ORAL | Status: DC
Start: 2019-07-15 — End: 2019-07-14

## 2019-07-14 MED ORDER — IPRATROPIUM-ALBUTEROL 0.5-2.5 (3) MG/3ML IN SOLN
3.00 | RESPIRATORY_TRACT | Status: DC
Start: 2019-07-14 — End: 2019-07-14

## 2019-07-14 MED ORDER — DIPHENHYDRAMINE HCL 25 MG PO CAPS
25.00 | ORAL_CAPSULE | ORAL | Status: DC
Start: ? — End: 2019-07-14

## 2019-07-14 MED ORDER — ALBUTEROL SULFATE HFA 108 (90 BASE) MCG/ACT IN AERS
2.00 | INHALATION_SPRAY | RESPIRATORY_TRACT | Status: DC
Start: ? — End: 2019-07-14

## 2019-07-14 MED ORDER — IPRATROPIUM-ALBUTEROL 0.5-2.5 (3) MG/3ML IN SOLN
3.00 | RESPIRATORY_TRACT | Status: DC
Start: ? — End: 2019-07-14

## 2019-07-14 MED ORDER — GENERIC EXTERNAL MEDICATION
75.00 | Status: DC
Start: 2019-07-15 — End: 2019-07-14

## 2019-07-14 MED ORDER — GENERIC EXTERNAL MEDICATION
2.00 | Status: DC
Start: ? — End: 2019-07-14

## 2019-07-14 MED ORDER — MELATONIN 3 MG PO TABS
6.00 | ORAL_TABLET | ORAL | Status: DC
Start: ? — End: 2019-07-14

## 2019-07-14 MED ORDER — GENERIC EXTERNAL MEDICATION
Status: DC
Start: ? — End: 2019-07-14

## 2019-07-14 MED ORDER — ACETAMINOPHEN 325 MG PO TABS
650.00 | ORAL_TABLET | ORAL | Status: DC
Start: ? — End: 2019-07-14

## 2019-07-14 MED ORDER — ARIPIPRAZOLE 10 MG PO TABS
10.00 | ORAL_TABLET | ORAL | Status: DC
Start: 2019-07-15 — End: 2019-07-14

## 2019-07-19 ENCOUNTER — Encounter: Payer: Self-pay | Admitting: Family Medicine

## 2019-07-20 ENCOUNTER — Encounter: Payer: Self-pay | Admitting: Family Medicine

## 2019-07-21 ENCOUNTER — Encounter: Payer: Self-pay | Admitting: Family Medicine

## 2019-07-21 ENCOUNTER — Other Ambulatory Visit: Payer: Self-pay

## 2019-07-21 ENCOUNTER — Ambulatory Visit: Payer: Medicaid Other | Admitting: Family Medicine

## 2019-07-21 VITALS — BP 94/56 | HR 80 | Temp 97.1°F | Resp 16 | Ht 68.0 in | Wt 222.8 lb

## 2019-07-21 DIAGNOSIS — Z23 Encounter for immunization: Secondary | ICD-10-CM

## 2019-07-21 DIAGNOSIS — M797 Fibromyalgia: Secondary | ICD-10-CM | POA: Diagnosis not present

## 2019-07-21 DIAGNOSIS — J4520 Mild intermittent asthma, uncomplicated: Secondary | ICD-10-CM

## 2019-07-21 MED ORDER — METHOCARBAMOL 500 MG PO TABS: 500.0000 mg | ORAL_TABLET | Freq: Three times a day (TID) | ORAL | 0 refills | Status: DC | PRN

## 2019-07-21 MED ORDER — PREGABALIN 50 MG PO CAPS: 50.0000 mg | ORAL_CAPSULE | Freq: Three times a day (TID) | ORAL | 0 refills | Status: DC

## 2019-07-21 NOTE — Progress Notes (Signed)
Name: Renee Benjamin   MRN: [id number]    DOB: April 14, 1981   Date:07/21/2019       Progress Note  Subjective  Chief Complaint  Chief Complaint  Patient presents with  . Fibromyalgia    Flare    HPI  Hospital discharge follow up: she was at [facility] and went to Urgent Care on 07/09/2019 she was given an injection of solumedrol and was given 60 mg of prednisone for 3 more days, she did not improve and went to [facility] on 07/12/2019 and was admitted , she was given neb therapy, prednisone 60 mg and sent home off symbicort and on flovent also tessalon perles. She is still having some wheezing a deep cough but doing better.   FMS: she states since she came off prednisone 3 days ago her body is very sore , she is very stiff all over, states has to get up and take a hot shower, she was on prednisone from 09/05 until 09/14. She states unable to move if she sits still, hurts all over " feels like my muscle are being torn apart"    Patient Active Problem List   Diagnosis Date Noted  . Bipolar affective, mixed ([place]) 03/24/2019  . Anxiety 03/08/2019  . Pancreatitis 08/20/2018  . Right upper quadrant abdominal pain   . Polysubstance abuse ([place]) 01/22/2018  . Moderate recurrent major depression ([place]) 01/21/2018  . Cocaine abuse ([place]) 01/21/2018  . Cannabis abuse 01/21/2018  . Benzodiazepine abuse ([place]) 01/21/2018  . Barbiturate abuse ([place]) 01/21/2018  . Opiate abuse, episodic ([place]) 01/21/2018  . Allergic rhinitis 02/24/2017  . Vitamin B12 deficiency 02/24/2017  . Chronic right hip pain 04/10/2016  . Controlled substance agreement signed 02/01/2016  . Obesity 02/01/2016  . ADHD (attention deficit hyperactivity disorder) 01/09/2016  . Fibromyalgia 01/09/2016  . Torticollis, acute 10/08/2015  . Headache 10/02/2015  . Thoracic back pain 10/02/2015  . Lumbar pain 10/02/2015  . Arthralgia 10/02/2015  . Chronic tonsillitis 08/20/2015  . Migraine headache with aura 04/10/2015  .  Hypothyroidism 04/10/2015  . PTSD (post-traumatic stress disorder) 04/10/2015  . Status post laparoscopic hysterectomy 01/03/2015  . Chronic female pelvic pain 11/23/2014  . Plantar fasciitis of left foot   . Stress fracture   . Asthma 12/16/2012  . Bipolar affective disorder, depressed, moderate ([place]) 12/16/2012    Past Surgical History:  Procedure Laterality Date  . ABDOMINAL HYSTERECTOMY  March 2016   Due to uterus being stiched into C Section incision  . APPENDECTOMY    . APPENDECTOMY  2008  . CESAREAN SECTION    . CESAREAN SECTION  2014  . LAPAROSCOPIC BILATERAL SALPINGECTOMY Bilateral 01/02/2015   Procedure: LAPAROSCOPIC BILATERAL SALPINGECTOMY;  Surgeon: Osborne Oman, MD;  Location: [facility];  Service: Gynecology;  Laterality: Bilateral;  . LAPAROSCOPIC HYSTERECTOMY N/A 01/02/2015   Procedure: HYSTERECTOMY TOTAL LAPAROSCOPIC;  Surgeon: Osborne Oman, MD;  Location: [facility];  Service: Gynecology;  Laterality: N/A;  . SHOULDER SURGERY Right   . SHOULDER SURGERY  2001   right    Family History  Problem Relation Age of Onset  . Diabetes Father   . Osteoporosis Mother   . Cancer Maternal Grandmother        breast  . Osteoporosis Maternal Grandmother     Social History   Socioeconomic History  . Marital status: Single    Spouse name: Not on file  . Number of children: 1  . Years of education: Not on file  .  Highest education level: GED or equivalent  Occupational History  . Not on file  Social Needs  . Financial resource strain: Somewhat hard  . Food insecurity    Worry: Never true    Inability: Never true  . Transportation needs    Medical: No    Non-medical: No  Tobacco Use  . Smoking status: Former Smoker    Packs/day: 0.25    Years: 4.00    Pack years: 1.00    Types: Cigarettes    Quit date: 04/17/2012    Years since quitting: 7.2  . Smokeless tobacco: Never Used  Substance and Sexual Activity  . Alcohol use: No    Alcohol/week: 0.0 standard drinks   . Drug use: No  . Sexual activity: Yes    Partners: Male    Birth control/protection: None  Lifestyle  . Physical activity    Days per week: 5 days    Minutes per session: 60 min  . Stress: Only a little  Relationships  . Social Herbalist on phone: More than three times a week    Gets together: Never    Attends religious service: Never    Active member of club or organization: No    Attends meetings of clubs or organizations: Never    Relationship status: Living with partner  . Intimate partner violence    Fear of current or ex partner: No    Emotionally abused: No    Physically abused: No    Forced sexual activity: No  Other Topics Concern  . Not on file  Social History Narrative   ** Merged History Encounter **         Current Outpatient Medications:  .  albuterol (PROVENTIL) (2.5 MG/3ML) 0.083% nebulizer solution, Inhale into the lungs., Disp: , Rfl:  .  amphetamine-dextroamphetamine (ADDERALL) 20 MG tablet, Take 20 mg by mouth 2 (two) times daily., Disp: , Rfl:  .  ARIPiprazole (ABILIFY) 10 MG tablet, Take 0.5 tablets (5 mg total) by mouth at bedtime., Disp: 30 tablet, Rfl: 0 .  benzonatate (TESSALON) 100 MG capsule, Take by mouth., Disp: , Rfl:  .  fexofenadine (ALLEGRA) 180 MG tablet, Take 180 mg by mouth daily., Disp: , Rfl:  .  fluticasone (FLOVENT HFA) 44 MCG/ACT inhaler, Inhale into the lungs., Disp: , Rfl:  .  hydrOXYzine (ATARAX/VISTARIL) 25 MG tablet, Take 1-2 tablets (25-50 mg total) by mouth 4 (four) times daily as needed for anxiety., Disp: 30 tablet, Rfl: 0 .  levothyroxine (SYNTHROID, LEVOTHROID) 75 MCG tablet, TAKE 1 TABLET BY MOUTH ONCE DAILY ON AN EMPTY STOMACH. WAIT 30 MINUTES BEFORE TAKING OTHER MEDS., Disp: 30 tablet, Rfl: 5 .  montelukast (SINGULAIR) 10 MG tablet, TAKE 1 TABLET BY MOUTH EVERY DAY, Disp: 30 tablet, Rfl: 11 .  PROAIR HFA 108 (90 Base) MCG/ACT inhaler, INHALE 2 PUFFS BY MOUTH EVERY 6 HOURS IFNEEDED FOR WHEEZING OR SHORTNESS  OF BREATH. (Patient taking differently: Inhale 2 puffs into the lungs every 6 (six) hours as needed for wheezing or shortness of breath. ), Disp: 8.5 g, Rfl: 0 .  budesonide-formoterol (SYMBICORT) 80-4.5 MCG/ACT inhaler, Inhale 2 puffs into the lungs 2 (two) times daily. (Patient not taking: Reported on 07/21/2019), Disp: 1 Inhaler, Rfl: 3 .  fluconazole (DIFLUCAN) 150 MG tablet, Take 1 tablet (150 mg total) by mouth every other day. (Patient not taking: Reported on 07/21/2019), Disp: 3 tablet, Rfl: 0 .  pantoprazole (PROTONIX) 40 MG tablet, Take 1 tablet (40 mg  total) by mouth daily., Disp: 30 tablet, Rfl: 1  Allergies  Allergen Reactions  . Depakote [Divalproex Sodium] Hives  . Morphine Hives  . Prednisone     Possibly mania  . Propoxyphene Other (See Comments)  . Tramadol Nausea Only  . Tramadol Nausea And Vomiting and Other (See Comments)  . Darvocet [Propoxyphene N-Acetaminophen] Rash  . Valproic Acid Rash    I personally reviewed active problem list, medication list, allergies, family history, social history, health maintenance with the patient/caregiver today.   ROS  Constitutional: Negative for fever or weight change.  Respiratory: positive  for cough and shortness of breath.   Cardiovascular: Negative for chest pain or palpitations.  Gastrointestinal: Negative for abdominal pain, no bowel changes.  Musculoskeletal: Negative for gait problem ( walking slower because of FMS)  or joint swelling.  Skin: Negative for rash.  Neurological: Negative for dizziness or headache.  No other specific complaints in a complete review of systems (except as listed in HPI above).  Objective  Vitals:   07/21/19 0850  BP: (!) 94/56  Pulse: 80  Resp: 16  Temp: (!) 97.1 F (36.2 C)  TempSrc: Temporal  SpO2: 97%  Weight: 222 lb 12.8 oz (101.1 kg)  Height: 5\' 8"  (1.727 m)    Body mass index is 33.88 kg/m.  Physical Exam  Constitutional: Patient appears well-developed and  well-nourished. Obese  No distress.  HEENT: head atraumatic, normocephalic, pupils equal and reactive to light Cardiovascular: Normal rate, regular rhythm and normal heart sounds.  No murmur heard. No BLE edema. Pulmonary/Chest: Effort normal , she has scattered wheezing, some cough during exam. No respiratory distress. Abdominal: Soft.  There is no tenderness. Muscular Skeletal: trigger point positive  Psychiatric: Patient has a normal mood and affect. behavior is normal. Judgment and thought content normal.  Recent Results (from the past 2160 hour(s))  Basic metabolic panel     Status: Abnormal   Collection Time: 06/15/19  1:18 AM  Result Value Ref Range   Sodium 136 135 - 145 mmol/L   Potassium 3.1 (L) 3.5 - 5.1 mmol/L   Chloride 104 98 - 111 mmol/L   CO2 23 22 - 32 mmol/L   Glucose, Bld 111 (H) 70 - 99 mg/dL   BUN 10 6 - 20 mg/dL   Creatinine, Ser 0.70 0.44 - 1.00 mg/dL   Calcium 8.9 8.9 - 10.3 mg/dL   GFR calc non Af Amer >60 >60 mL/min   GFR calc Af Amer >60 >60 mL/min   Anion gap 9 5 - 15    Comment: Performed at [facility], [address] [place], [place] [address]  CBC     Status: Abnormal   Collection Time: 06/15/19  1:18 AM  Result Value Ref Range   WBC 12.8 (H) 4.0 - 10.5 K/uL   RBC 4.46 3.87 - 5.11 MIL/uL   Hemoglobin 14.2 12.0 - 15.0 g/dL   HCT 40.2 36.0 - 46.0 %   MCV 90.1 80.0 - 100.0 fL   MCH 31.8 26.0 - 34.0 pg   MCHC 35.3 30.0 - 36.0 g/dL   RDW 11.9 11.5 - 15.5 %   Platelets 313 150 - 400 K/uL   nRBC 0.0 0.0 - 0.2 %    Comment: Performed at [facility], [address] [place],  [address]  Troponin I (High Sensitivity)     Status: None   Collection Time: 06/15/19  1:18 AM  Result Value Ref Range   Troponin I (High  Sensitivity) 3 <18 ng/L    Comment: (NOTE) Elevated high sensitivity troponin I (hsTnI) values and significant  changes across serial measurements may suggest ACS but many other  chronic and acute conditions are  known to elevate hsTnI results.  Refer to the "Links" section for chest pain algorithms and additional  guidance. Performed at [facility], [address] [place], [place] [address]     PHQ2/9: Depression screen [facility] 2/9 07/21/2019 03/24/2019 03/01/2019 02/25/2019 07/20/2018  Decreased Interest 0 0 0 - 0  Down, Depressed, Hopeless 0 0 0 - -  PHQ - 2 Score 0 0 0 - 0  Altered sleeping 0 0 0 - 0  Tired, decreased energy 0 0 0 - 0  Change in appetite 0 0 0 - 0  Feeling bad or failure about yourself  0 0 0 - 0  Trouble concentrating 0 0 0 - 0  Moving slowly or fidgety/restless 0 0 0 - 0  Suicidal thoughts 0 0 0 0 0  PHQ-9 Score 0 0 0 - 0  Difficult doing work/chores - Not difficult at all Not difficult at all - Not difficult at all    phq 9 is negative   Fall Risk: Fall Risk  07/21/2019 03/24/2019 03/01/2019 02/25/2019 07/20/2018  Falls in the past year? 0 0 0 0 No  Number falls in past yr: 0 0 - 0 -  Injury with Fall? 0 0 - 0 -     Functional Status Survey: Is the patient deaf or have difficulty hearing?: No Does the patient have difficulty seeing, even when wearing glasses/contacts?: No Does the patient have difficulty concentrating, remembering, or making decisions?: No Does the patient have difficulty walking or climbing stairs?: Yes Does the patient have difficulty dressing or bathing?: No Does the patient have difficulty doing errands alone such as visiting a doctor's office or shopping?: No    Assessment & Plan   1. Fibromyalgia  - methocarbamol (ROBAXIN) 500 MG tablet; Take 1 tablet (500 mg total) by mouth every 8 (eight) hours as needed for muscle spasms.  Dispense: 90 tablet; Refill: 0 - pregabalin (LYRICA) 50 MG capsule; Take 1 capsule (50 mg total) by mouth 3 (three) times daily. Start once a day and go up slowly  Dispense: 90 capsule; Refill: 0  2. Asthma, mild intermittent, poorly controlled  - fluticasone (FLOVENT HFA) 44 MCG/ACT inhaler; Inhale into  the lungs. - benzonatate (TESSALON) 100 MG capsule; Take by mouth. - albuterol (PROVENTIL) (2.5 MG/3ML) 0.083% nebulizer solution; Inhale into the lungs. - Ambulatory referral to Pulmonology  3. Need for immunization against influenza  - Flu Vaccine QUAD 36+ mos IM

## 2019-07-27 ENCOUNTER — Encounter: Payer: Self-pay | Admitting: Family Medicine

## 2019-07-28 ENCOUNTER — Encounter: Payer: Self-pay | Admitting: Family Medicine

## 2019-07-29 ENCOUNTER — Ambulatory Visit (INDEPENDENT_AMBULATORY_CARE_PROVIDER_SITE_OTHER): Payer: Medicaid Other | Admitting: Pulmonary Disease

## 2019-07-29 ENCOUNTER — Encounter: Payer: Self-pay | Admitting: Pulmonary Disease

## 2019-07-29 ENCOUNTER — Other Ambulatory Visit: Payer: Self-pay

## 2019-07-29 VITALS — BP 118/70 | HR 84 | Temp 97.5°F | Ht 68.0 in | Wt 225.6 lb

## 2019-07-29 DIAGNOSIS — J4520 Mild intermittent asthma, uncomplicated: Secondary | ICD-10-CM

## 2019-07-29 DIAGNOSIS — R0602 Shortness of breath: Secondary | ICD-10-CM | POA: Diagnosis not present

## 2019-07-29 DIAGNOSIS — E669 Obesity, unspecified: Secondary | ICD-10-CM

## 2019-07-29 MED ORDER — BREO ELLIPTA 100-25 MCG/INH IN AEPB: 1.0000 | INHALATION_SPRAY | Freq: Every day | RESPIRATORY_TRACT | 0 refills | Status: AC

## 2019-07-29 MED ORDER — ALBUTEROL SULFATE (2.5 MG/3ML) 0.083% IN NEBU: 2.5000 mg | INHALATION_SOLUTION | Freq: Four times a day (QID) | RESPIRATORY_TRACT | 5 refills | Status: DC | PRN

## 2019-07-29 NOTE — Progress Notes (Signed)
Subjective:    Patient ID: Renee Benjamin, female    DOB: [birth year], 38 y.o.   MRN: [id number]  HPI Renee Benjamin is a 38 year old former smoker (quit 2013, 1 pack year history total) who presents for evaluation and management of asthma.  The patient is kindly referred by Dr. Etter Sjogren.  The patient states that she has had issues with asthma off and on throughout the years but usually well controlled with albuterol inhaler.  She has never been on maintenance medications.  She endorses at least 3 exacerbations per year for many years.  Around the time when the COVID-19 restrictions started she noted that she developed issues with increasing shortness of breath and required a prednisone taper at that time.  Since then she has noted increased use of albuterol.  She was admitted to [facility] on September 8-10.  She had an asthma exacerbation.  She was discharged on Flovent 44 mcg 2 puffs twice a day, albuterol nebulizer as needed and albuterol metered-dose inhaler as needed.  She was also placed on Singulair 10 mg.  She has noted that she is doing better but still having difficulties with dyspnea needing to use albuterol frequently.  She notes that albuterol helps her when she uses it.  She has not had any fevers, chills or sweats.  Occasionally has a dry hacking cough but no sputum production.  No hemoptysis.  She has not had any chest pain, palpitations, orthopnea or paroxysmal nocturnal dyspnea.  No lower extremity edema.  She voices no other complaint.  Past medical history, past surgical history, family history and social history have been reviewed and are as noted.  With regards to her social history she works in childcare, she smoked only a quarter of a pack a day for approximately 4 years but quit in 2013.  She does not endorse use of illicit drugs.  She has no other occupational exposures.  No unusual hobbies or pets.   Review of Systems  Constitutional: Negative.   HENT:  Positive for congestion and postnasal drip.   Eyes: Negative.   Respiratory: Positive for chest tightness, shortness of breath and wheezing.   Cardiovascular: Negative.   Gastrointestinal: Negative.        Does not endorse reflux symptoms.  Endocrine: Negative.   Genitourinary: Negative.   Musculoskeletal: Negative.   Skin: Negative.   Allergic/Immunologic: Negative.   Neurological: Negative.   Hematological: Negative.   Psychiatric/Behavioral: Negative.   All other systems reviewed and are negative.      Objective:   Physical Exam Vitals signs and nursing note reviewed.  Constitutional:      General: She is not in acute distress.    Appearance: Normal appearance. She is obese.  HENT:     Head: Normocephalic and atraumatic.     Right Ear: External ear normal.     Left Ear: External ear normal.     Nose:     Comments: Nose/mouth/throat not examined due to masking requirements for COVID 19. Eyes:     General: No scleral icterus.    Conjunctiva/sclera: Conjunctivae normal.     Pupils: Pupils are equal, round, and reactive to light.  Neck:     Musculoskeletal: Neck supple.     Thyroid: No thyromegaly.     Trachea: Trachea and phonation normal.  Cardiovascular:     Rate and Rhythm: Normal rate and regular rhythm.     Pulses: Normal pulses.     Heart sounds: Normal heart sounds.  No murmur.  Pulmonary:     Effort: Pulmonary effort is normal. No respiratory distress.     Breath sounds: No wheezing, rhonchi or rales.     Comments: Somewhat coarse breath sounds. Abdominal:     General: Abdomen is protuberant. There is no distension.  Musculoskeletal: Normal range of motion.     Right lower leg: No edema.     Left lower leg: No edema.  Lymphadenopathy:     Cervical: No cervical adenopathy.  Skin:    General: Skin is warm and dry.  Neurological:     General: No focal deficit present.     Mental Status: She is alert and oriented to person, place, and time.  Psychiatric:         Mood and Affect: Mood normal.        Behavior: Behavior normal.        Assessment & Plan:   1.  Intermittent asthma, likely mild/moderate, poorly controlled: Her inhaled corticosteroid dose is more of a pediatric dose and it appears also that she needs a long-acting bronchodilator as well.  For this reason we will switch her to a LABA/ICS combination, Breo Ellipta 100/25 1 inhalation daily.  Hopefully this will decrease her need for albuterol.  She was instructed to discontinue Flovent for now.  She may continue using albuterol as needed but the hope is that this will be decreased with maintenance inhaler.  We will schedule her for pulmonary function testing.  We will see her in follow-up in 2 months time she is to contact us prior to that time should any new problems arise.  She is aware that her PFTs may not be done before then due to the backlog from COVID-19.  Patient was taught on the proper use of the dry powder inhaler.  2.  Shortness of breath: Likely related to poorly controlled asthma as above.  Management as above.  3.  Obesity class I with BMI of 34.3: Weight loss is recommended.   Thank you for allowing me to participate in this patient's care.   This chart was dictated using voice recognition software/Dragon.  Despite best efforts to proofread, errors can occur which can change the meaning.  Any change was purely unintentional.

## 2019-07-29 NOTE — Patient Instructions (Signed)
1.  We will start Breo Ellipta 100/25, 1 inhalation daily.  Rinse your mouth well after use.  2.  Continue to use albuterol (i.e. Ventolin, Pro-Air, Proventil) only as needed.  You may use it up to 4 times a day.  3.  We are going to schedule breathing tests.  4.  We will see you in follow-up in 2 months time.

## 2019-07-30 ENCOUNTER — Emergency Department
Admission: EM | Admit: 2019-07-30 | Discharge: 2019-07-30 | Disposition: A | Discharge status: Home / Self Care | Payer: Medicaid Other | Attending: Emergency Medicine | Admitting: Emergency Medicine

## 2019-07-30 ENCOUNTER — Other Ambulatory Visit: Payer: Self-pay

## 2019-07-30 DIAGNOSIS — Z79899 Other long term (current) drug therapy: Secondary | ICD-10-CM | POA: Insufficient documentation

## 2019-07-30 DIAGNOSIS — J45909 Unspecified asthma, uncomplicated: Secondary | ICD-10-CM | POA: Insufficient documentation

## 2019-07-30 DIAGNOSIS — E039 Hypothyroidism, unspecified: Secondary | ICD-10-CM | POA: Diagnosis not present

## 2019-07-30 DIAGNOSIS — Z87891 Personal history of nicotine dependence: Secondary | ICD-10-CM | POA: Insufficient documentation

## 2019-07-30 DIAGNOSIS — K029 Dental caries, unspecified: Secondary | ICD-10-CM

## 2019-07-30 DIAGNOSIS — K0889 Other specified disorders of teeth and supporting structures: Secondary | ICD-10-CM | POA: Diagnosis not present

## 2019-07-30 MED ORDER — ONDANSETRON 4 MG PO TBDP
4.0000 mg | ORAL_TABLET | Freq: Once | ORAL | Status: AC | PRN
  Administered 2019-07-30: 02:00:00 4 mg via ORAL

## 2019-07-30 MED ORDER — KETOROLAC TROMETHAMINE 60 MG/2ML IM SOLN
30.0000 mg | Freq: Once | INTRAMUSCULAR | Status: AC
  Administered 2019-07-30: 30 mg via INTRAMUSCULAR
  Filled 2019-07-30: qty 2

## 2019-07-30 MED ORDER — ONDANSETRON 4 MG PO TBDP
ORAL_TABLET | ORAL | Status: AC
  Administered 2019-07-30: 4 mg via ORAL
  Filled 2019-07-30: qty 1

## 2019-07-30 NOTE — ED Provider Notes (Signed)
[facility] Emergency Department Provider Note   ____________________________________________   First MD Initiated Contact with Patient 07/30/19 0256     (approximate)  I have reviewed the triage vital signs and the nursing notes.   HISTORY  Chief Complaint Dental Pain    HPI Renee Benjamin is a 38 y.o. female with past medical history listed below who presents to the ED complaining of dental pain.  Patient reports that she was chewing on a piece of ice earlier this evening when a portion of her right lower molar cracked.  She had immediate onset of pain, took some ibuprofen that partially alleviated her pain, however is now having difficulty sleeping secondary to dental pain.  She has not noticed any swelling or drainage from the area.  She denies any difficulty tolerating p.o.  She has had similar dental issues in the past with known dental caries, states her dentist office was closed today.        Past Medical History:  Diagnosis Date  . ADHD (attention deficit hyperactivity disorder) 01/09/2016  . Allergy   . Anemia   . Anxiety   . Anxiety    separation anxiety  . Asthma   . Chronic right hip pain 04/10/2016  . Collagen vascular disease ([place])   . Endometriosis   . Fibromyalgia 01/09/2016  . Headache    otc med prn  . Hypothyroidism   . Plantar fasciitis of left foot 08.29.14   PLANTAR HEEL ,FLUID AROUND MEDIAL BAND  . Polycystic ovarian disease   . Polysubstance abuse ([place]) 01/22/2018   ER visit March 2019: Drug screen is positive for marijuana, cocaine, benzodiazepines, barbiturates, and opiates  . Stress fracture 08.15.14   RIGHT   . Thoracic back pain   . Thyroid disease   . Vitamin D deficiency disease     Patient Active Problem List   Diagnosis Date Noted  . Bipolar affective, mixed ([place]) 03/24/2019  . Anxiety 03/08/2019  . Pancreatitis 08/20/2018  . Right upper quadrant abdominal pain   . Polysubstance abuse ([place]) 01/22/2018   . Moderate recurrent major depression ([place]) 01/21/2018  . Cocaine abuse ([place]) 01/21/2018  . Cannabis abuse 01/21/2018  . Benzodiazepine abuse ([place]) 01/21/2018  . Barbiturate abuse ([place]) 01/21/2018  . Opiate abuse, episodic ([place]) 01/21/2018  . Allergic rhinitis 02/24/2017  . Vitamin B12 deficiency 02/24/2017  . Chronic right hip pain 04/10/2016  . Controlled substance agreement signed 02/01/2016  . Obesity 02/01/2016  . ADHD (attention deficit hyperactivity disorder) 01/09/2016  . Fibromyalgia 01/09/2016  . Chronic fatigue 12/04/2015  . Torticollis, acute 10/08/2015  . Headache 10/02/2015  . Thoracic back pain 10/02/2015  . Lumbar pain 10/02/2015  . Arthralgia 10/02/2015  . Chronic tonsillitis 08/20/2015  . Migraine headache with aura 04/10/2015  . Hypothyroidism 04/10/2015  . PTSD (post-traumatic stress disorder) 04/10/2015  . Status post laparoscopic hysterectomy 01/03/2015  . Chronic female pelvic pain 11/23/2014  . Plantar fasciitis of left foot   . Stress fracture   . Asthma 12/16/2012  . Bipolar affective disorder, depressed, moderate ([place]) 12/16/2012    Past Surgical History:  Procedure Laterality Date  . ABDOMINAL HYSTERECTOMY  March 2016   Due to uterus being stiched into C Section incision  . APPENDECTOMY    . APPENDECTOMY  2008  . CESAREAN SECTION    . CESAREAN SECTION  2014  . LAPAROSCOPIC BILATERAL SALPINGECTOMY Bilateral 01/02/2015   Procedure: LAPAROSCOPIC BILATERAL SALPINGECTOMY;  Surgeon: Osborne Oman, MD;  Location: [facility]  ORS;  Service: Gynecology;  Laterality: Bilateral;  . LAPAROSCOPIC HYSTERECTOMY N/A 01/02/2015   Procedure: HYSTERECTOMY TOTAL LAPAROSCOPIC;  Surgeon: Osborne Oman, MD;  Location: [place] [facility];  Service: Gynecology;  Laterality: N/A;  . SHOULDER SURGERY Right   . SHOULDER SURGERY  2001   right    Prior to Admission medications   Medication Sig Start Date End Date Taking? Authorizing Provider  albuterol (PROVENTIL) (2.5 MG/3ML) 0.083%  nebulizer solution Take 3 mLs (2.5 mg total) by nebulization every 6 (six) hours as needed for wheezing or shortness of breath. 07/29/19 08/28/19  Tyler Pita, MD  amphetamine-dextroamphetamine (ADDERALL) 20 MG tablet Take 20 mg by mouth 2 (two) times daily. 01/26/19   [provider]  ARIPiprazole (ABILIFY) 10 MG tablet Take 0.5 tablets (5 mg total) by mouth at bedtime. 03/08/19   Lavella Hammock, MD  fexofenadine (ALLEGRA) 180 MG tablet Take 180 mg by mouth daily.    [provider]  fluticasone furoate-vilanterol (BREO ELLIPTA) 100-25 MCG/INH AEPB Inhale 1 puff into the lungs daily. 07/29/19 08/28/19  Tyler Pita, MD  hydrOXYzine (ATARAX/VISTARIL) 25 MG tablet Take 1-2 tablets (25-50 mg total) by mouth 4 (four) times daily as needed for anxiety. 12/22/18   Arta Silence, MD  levothyroxine (SYNTHROID, LEVOTHROID) 75 MCG tablet TAKE 1 TABLET BY MOUTH ONCE DAILY ON AN EMPTY STOMACH. WAIT 30 MINUTES BEFORE TAKING OTHER MEDS. 09/03/18   Poulose, Bethel Born, NP  methocarbamol (ROBAXIN) 500 MG tablet Take 1 tablet (500 mg total) by mouth every 8 (eight) hours as needed for muscle spasms. 07/21/19   Sowles, Drue Stager, MD  montelukast (SINGULAIR) 10 MG tablet TAKE 1 TABLET BY MOUTH EVERY DAY 07/14/18   Lada, Satira Anis, MD  pregabalin (LYRICA) 50 MG capsule Take 1 capsule (50 mg total) by mouth 3 (three) times daily. Start once a day and go up slowly 07/21/19   Steele Sizer, MD  PROAIR HFA [phone] Base) MCG/ACT inhaler INHALE 2 PUFFS BY MOUTH EVERY 6 HOURS IFNEEDED FOR WHEEZING OR SHORTNESS OF BREATH. Patient taking differently: Inhale 2 puffs into the lungs every 6 (six) hours as needed for wheezing or shortness of breath.  02/23/18   Poulose, Bethel Born, NP    Allergies Depakote [divalproex sodium], Morphine, Prednisone, Propoxyphene, Tramadol, Tramadol, Darvocet [propoxyphene n-acetaminophen], and Valproic acid  Family History  Problem Relation Age of Onset  . Diabetes  Father   . Osteoporosis Mother   . Cancer Maternal Grandmother        breast  . Osteoporosis Maternal Grandmother     Social History Social History   Tobacco Use  . Smoking status: Former Smoker    Packs/day: 0.25    Years: 4.00    Pack years: 1.00    Types: Cigarettes    Quit date: 04/17/2012    Years since quitting: 7.2  . Smokeless tobacco: Never Used  Substance Use Topics  . Alcohol use: No    Alcohol/week: 0.0 standard drinks  . Drug use: No    Review of Systems  Constitutional: No fever/chills Eyes: No visual changes. ENT: No sore throat.  Positive for dental pain. Cardiovascular: Denies chest pain. Respiratory: Denies shortness of breath. Gastrointestinal: No abdominal pain.  No nausea, no vomiting.  No diarrhea.  No constipation. Genitourinary: Negative for dysuria. Musculoskeletal: Negative for back pain. Skin: Negative for rash. Neurological: Negative for headaches, focal weakness or numbness.  ____________________________________________   PHYSICAL EXAM:  VITAL SIGNS: ED Triage Vitals  Enc Vitals Group  BP 07/30/19 0209 (!) 128/92     Pulse Rate 07/30/19 0209 76     Resp 07/30/19 0209 17     Temp 07/30/19 0209 97.9 F (36.6 C)     Temp Source 07/30/19 0209 Oral     SpO2 07/30/19 0209 98 %     Weight 07/30/19 0202 225 lb (102.1 kg)     Height 07/30/19 0202 5\' 8"  (1.727 m)     Head Circumference --      Peak Flow --      Pain Score 07/30/19 0202 7     Pain Loc --      Pain Edu? --      Excl. in [place]? --     Constitutional: Alert and oriented. Eyes: Conjunctivae are normal. Head: Atraumatic. Nose: No congestion/rhinnorhea. Mouth/Throat: Mucous membranes are moist.  Poor dentition with multiple missing molars, cracked right lower molar noted with no surrounding erythema, edema, or fluctuance. Neck: Normal ROM Cardiovascular: Normal rate, regular rhythm. Grossly normal heart sounds. Respiratory: Normal respiratory effort.  No retractions.  Lungs CTAB. Gastrointestinal: Soft and nontender. No distention. Genitourinary: deferred Musculoskeletal: No lower extremity tenderness nor edema. Neurologic:  Normal speech and language. No gross focal neurologic deficits are appreciated. Skin:  Skin is warm, dry and intact. No rash noted. Psychiatric: Mood and affect are normal. Speech and behavior are normal.  ____________________________________________   LABS (all labs ordered are listed, but only abnormal results are displayed)  Labs Reviewed - No data to display   PROCEDURES  Procedure(s) performed (including Critical Care):  Procedures   ____________________________________________   INITIAL IMPRESSION / ASSESSMENT AND PLAN / ED COURSE       38 year old female presenting to the ED with right lower dental pain after cracking her tooth on a piece of ice.  Cracked tooth appears to be due to dental caries, no evidence of infectious process.  Will treat pain with Toradol, patient states it has been about 6 hours since her last dose of NSAIDs and she is status post hysterectomy.  Counseled patient to alternate Tylenol and ibuprofen until she is able to see her dentist.  Counseled to return to the ED for new or worsening symptoms, patient agrees with plan.      ____________________________________________   FINAL CLINICAL IMPRESSION(S) / ED DIAGNOSES  Final diagnoses:  Pain, dental  Dental caries     ED Discharge Orders    None       Note:  This document was prepared using Dragon voice recognition software and may include unintentional dictation errors.   Blake Divine, MD 07/30/19 [phone]

## 2019-07-30 NOTE — ED Triage Notes (Addendum)
Patient c/o left lower dental pain. Patient reports she was eating ice and broke her tooth. Patient c/o severe nausea.

## 2019-08-11 ENCOUNTER — Encounter: Payer: Self-pay | Admitting: Family Medicine

## 2019-08-16 ENCOUNTER — Other Ambulatory Visit: Payer: Self-pay | Admitting: Family Medicine

## 2019-08-16 ENCOUNTER — Encounter: Payer: Self-pay | Admitting: Family Medicine

## 2019-08-16 ENCOUNTER — Ambulatory Visit: Payer: Medicaid Other | Admitting: Family Medicine

## 2019-08-16 ENCOUNTER — Other Ambulatory Visit: Payer: Self-pay

## 2019-08-16 VITALS — BP 120/92 | HR 75 | Temp 96.9°F | Resp 16 | Ht 68.0 in | Wt 221.5 lb

## 2019-08-16 DIAGNOSIS — E039 Hypothyroidism, unspecified: Secondary | ICD-10-CM | POA: Diagnosis not present

## 2019-08-16 DIAGNOSIS — M546 Pain in thoracic spine: Secondary | ICD-10-CM | POA: Diagnosis not present

## 2019-08-16 DIAGNOSIS — D72828 Other elevated white blood cell count: Secondary | ICD-10-CM | POA: Diagnosis not present

## 2019-08-16 DIAGNOSIS — E876 Hypokalemia: Secondary | ICD-10-CM

## 2019-08-16 MED ORDER — VALSARTAN-HYDROCHLOROTHIAZIDE 80-12.5 MG PO TABS: 1.0000 | ORAL_TABLET | Freq: Every day | ORAL | 1 refills | Status: DC
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
## 2019-08-17 LAB — POTASSIUM: Potassium: 3.9 mmol/L (ref 3.5–5.3)

## 2019-08-17 LAB — CBC WITH DIFFERENTIAL/PLATELET
Absolute Monocytes: 602 cells/uL (ref 200–950)
Basophils Absolute: 60 cells/uL (ref 0–200)
Basophils Relative: 0.7 %
Eosinophils Absolute: 86 cells/uL (ref 15–500)
Eosinophils Relative: 1 %
HCT: 41.5 % (ref 35.0–45.0)
Hemoglobin: 14.1 g/dL (ref 11.7–15.5)
Lymphs Abs: 2838 cells/uL (ref 850–3900)
MCH: 31.3 pg (ref 27.0–33.0)
MCHC: 34 g/dL (ref 32.0–36.0)
MCV: 92.2 fL (ref 80.0–100.0)
MPV: 10 fL (ref 7.5–12.5)
Monocytes Relative: 7 %
Neutro Abs: 5014 cells/uL (ref 1500–7800)
Neutrophils Relative %: 58.3 %
Platelets: 312 10*3/uL (ref 140–400)
RBC: 4.5 10*6/uL (ref 3.80–5.10)
RDW: 12.7 % (ref 11.0–15.0)
Total Lymphocyte: 33 %
WBC: 8.6 10*3/uL (ref 3.8–10.8)

## 2019-08-17 LAB — TSH: TSH: 3.41 mIU/L

## 2019-08-22 ENCOUNTER — Other Ambulatory Visit: Payer: Self-pay | Admitting: Family Medicine

## 2019-08-22 DIAGNOSIS — M797 Fibromyalgia: Secondary | ICD-10-CM

## 2019-08-26 ENCOUNTER — Other Ambulatory Visit: Payer: Self-pay | Admitting: Family Medicine

## 2019-08-26 ENCOUNTER — Encounter: Payer: Self-pay | Admitting: Family Medicine

## 2019-08-26 DIAGNOSIS — M797 Fibromyalgia: Secondary | ICD-10-CM

## 2019-08-26 MED ORDER — METHOCARBAMOL 500 MG PO TABS: 500.0000 mg | ORAL_TABLET | Freq: Three times a day (TID) | ORAL | 0 refills | Status: DC | PRN

## 2019-08-26 MED ORDER — PREGABALIN 50 MG PO CAPS: 50.0000 mg | ORAL_CAPSULE | Freq: Three times a day (TID) | ORAL | 0 refills | Status: DC

## 2019-09-08 ENCOUNTER — Other Ambulatory Visit: Payer: Self-pay | Admitting: Family Medicine

## 2019-09-08 ENCOUNTER — Encounter: Payer: Self-pay | Admitting: Family Medicine

## 2019-09-08 MED ORDER — FLUCONAZOLE 150 MG PO TABS: 150.0000 mg | ORAL_TABLET | ORAL | 0 refills | Status: DC

## 2019-09-09 ENCOUNTER — Other Ambulatory Visit: Payer: Self-pay | Admitting: Family Medicine

## 2019-09-09 DIAGNOSIS — M546 Pain in thoracic spine: Secondary | ICD-10-CM

## 2019-09-09 DIAGNOSIS — M542 Cervicalgia: Secondary | ICD-10-CM

## 2019-09-19 ENCOUNTER — Telehealth: Payer: Self-pay | Admitting: Pulmonary Disease

## 2019-09-19 ENCOUNTER — Ambulatory Visit: Payer: Medicaid Other | Admitting: Pulmonary Disease

## 2019-09-19 ENCOUNTER — Encounter: Payer: Self-pay | Admitting: Pulmonary Disease

## 2019-09-19 ENCOUNTER — Other Ambulatory Visit: Payer: Self-pay

## 2019-09-19 VITALS — BP 126/74 | HR 70 | Temp 98.0°F | Ht 68.0 in | Wt 220.4 lb

## 2019-09-19 DIAGNOSIS — R0602 Shortness of breath: Secondary | ICD-10-CM

## 2019-09-19 DIAGNOSIS — R079 Chest pain, unspecified: Secondary | ICD-10-CM

## 2019-09-19 DIAGNOSIS — J45909 Unspecified asthma, uncomplicated: Secondary | ICD-10-CM

## 2019-09-19 MED ORDER — BREO ELLIPTA 200-25 MCG/INH IN AEPB: 1.0000 | INHALATION_SPRAY | Freq: Every day | RESPIRATORY_TRACT | 0 refills | Status: DC

## 2019-09-19 MED ORDER — FLUTICASONE FUROATE-VILANTEROL 200-25 MCG/INH IN AEPB: 1.0000 | INHALATION_SPRAY | Freq: Every day | RESPIRATORY_TRACT | 11 refills | Status: DC

## 2019-09-19 NOTE — Telephone Encounter (Signed)
Received PA request for Breo and alternative list from [facility]. Per Dr. Patsey Berthold switch Breo to [facility] 200 BID.  ATC pt to make aware. Unable to leave voicemail, due to mailbox being full.  Will call back.

## 2019-09-19 NOTE — Progress Notes (Signed)
 Assessment & Plan:  1. Asthma in adult without complication, unspecified asthma severity, unspecified whether persistent  2. Shortness of breath (Primary)  3. Chest pain, unspecified type   Patient Instructions  1.  We have renewed your Breo.  We will try to get prior authorization.  2.  We will see you in 3 to 4 weeks time.  You may see me or the nurse practitioner.  Please note: late entry documentation due to logistical difficulties during COVID-19 pandemic. This note is filed for information purposes only, and is not intended to be used for billing, nor does it represent the full scope/nature of the visit in question. Please see any associated scanned media linked to date of encounter for additional pertinent information.  Subjective:    HPI: Renee Benjamin is a 38 y.o. female presenting to the pulmonology clinic on 09/19/2019 with report of: Follow-up (pt reports of sob with exertion, chest tightness and pressure on right side of chest, prod cough with clear mucus and wheezing. using proair  Q6H. )     Outpatient Encounter Medications as of 09/19/2019  Medication Sig   amphetamine -dextroamphetamine  (ADDERALL) 20 MG tablet Take 20 mg by mouth 2 (two) times daily.   [DISCONTINUED] ARIPiprazole  (ABILIFY ) 10 MG tablet Take 0.5 tablets (5 mg total) by mouth at bedtime. (Patient not taking: Reported on 07/03/2020)   [DISCONTINUED] fexofenadine  (ALLEGRA ) 180 MG tablet Take 180 mg by mouth daily. (Patient not taking: Reported on 07/03/2020)   [DISCONTINUED] fluconazole  (DIFLUCAN ) 150 MG tablet Take 1 tablet (150 mg total) by mouth every other day.   [DISCONTINUED] fluticasone  (FLOVENT  HFA) 44 MCG/ACT inhaler Inhale into the lungs 2 (two) times daily.   [DISCONTINUED] hydrOXYzine  (ATARAX /VISTARIL ) 25 MG tablet Take 1-2 tablets (25-50 mg total) by mouth 4 (four) times daily as needed for anxiety. (Patient not taking: Reported on 07/03/2020)   [DISCONTINUED] levothyroxine  (SYNTHROID ,  LEVOTHROID) 75 MCG tablet TAKE 1 TABLET BY MOUTH ONCE DAILY ON AN EMPTY STOMACH. WAIT 30 MINUTES BEFORE TAKING OTHER MEDS.   [DISCONTINUED] methocarbamol  (ROBAXIN ) 500 MG tablet Take 1 tablet (500 mg total) by mouth every 8 (eight) hours as needed for muscle spasms.   [DISCONTINUED] montelukast  (SINGULAIR ) 10 MG tablet TAKE 1 TABLET BY MOUTH EVERY DAY   [DISCONTINUED] pregabalin  (LYRICA ) 50 MG capsule Take 1 capsule (50 mg total) by mouth 3 (three) times daily. Start once a day and go up slowly   [DISCONTINUED] PROAIR  HFA 108 (90 Base) MCG/ACT inhaler INHALE 2 PUFFS BY MOUTH EVERY 6 HOURS IFNEEDED FOR WHEEZING OR SHORTNESS OF BREATH. (Patient taking differently: Inhale 2 puffs into the lungs every 6 (six) hours as needed for wheezing or shortness of breath. )   [DISCONTINUED] albuterol  (PROVENTIL ) (2.5 MG/3ML) 0.083% nebulizer solution Take 3 mLs (2.5 mg total) by nebulization every 6 (six) hours as needed for wheezing or shortness of breath.   [DISCONTINUED] fluticasone  furoate-vilanterol (BREO ELLIPTA ) 200-25 MCG/INH AEPB Inhale 1 puff into the lungs daily for 1 day.   [DISCONTINUED] fluticasone  furoate-vilanterol (BREO ELLIPTA ) 200-25 MCG/INH AEPB Inhale 1 puff into the lungs daily.   No facility-administered encounter medications on file as of 09/19/2019.      Objective:   Vitals:   09/19/19 0856  BP: 126/74  Pulse: 70  Temp: 98 F (36.7 C)  Height: 5' 8 (1.727 m)  Weight: 220 lb 6.4 oz (100 kg)  SpO2: 97%  TempSrc: Temporal  BMI (Calculated): 33.52     Physical exam documentation is limited by delayed  entry of information.

## 2019-09-19 NOTE — Patient Instructions (Signed)
1.  We have renewed your Breo.  We will try to get prior authorization.  2.  We will see you in 3 to 4 weeks time.  You may see me or the nurse practitioner.

## 2019-09-20 ENCOUNTER — Encounter: Payer: Self-pay | Admitting: Family Medicine

## 2019-09-20 NOTE — Telephone Encounter (Signed)
ATC x2- unable to leave voicemail, due to mailbox being full.  Will call back.

## 2019-09-21 ENCOUNTER — Other Ambulatory Visit: Payer: Self-pay | Admitting: Student

## 2019-09-21 DIAGNOSIS — M5441 Lumbago with sciatica, right side: Secondary | ICD-10-CM

## 2019-09-21 DIAGNOSIS — G8929 Other chronic pain: Secondary | ICD-10-CM

## 2019-09-21 NOTE — Telephone Encounter (Signed)
ATC pt- unable to leave vm, due to mailbox being full. Will call back.  

## 2019-09-23 NOTE — Telephone Encounter (Signed)
ATC pt- unable to leave vm due to mailbox being full.  

## 2019-09-26 MED ORDER — DULERA 200-5 MCG/ACT IN AERO: 2.0000 | INHALATION_SPRAY | Freq: Two times a day (BID) | RESPIRATORY_TRACT | 2 refills | Status: DC

## 2019-09-26 NOTE — Telephone Encounter (Signed)
Pt is aware of recommendations and voiced her understanding.  Rx for Dulera 200 has been sent to preferred pharmacy. Nothing further is needed.

## 2019-10-09 ENCOUNTER — Emergency Department
Admission: EM | Admit: 2019-10-09 | Discharge: 2019-10-09 | Disposition: A | Discharge status: Home / Self Care | Payer: Medicaid Other | Attending: Emergency Medicine | Admitting: Emergency Medicine

## 2019-10-09 ENCOUNTER — Other Ambulatory Visit: Payer: Self-pay

## 2019-10-09 ENCOUNTER — Emergency Department: Payer: Medicaid Other

## 2019-10-09 DIAGNOSIS — Y999 Unspecified external cause status: Secondary | ICD-10-CM | POA: Insufficient documentation

## 2019-10-09 DIAGNOSIS — Z79899 Other long term (current) drug therapy: Secondary | ICD-10-CM | POA: Diagnosis not present

## 2019-10-09 DIAGNOSIS — E039 Hypothyroidism, unspecified: Secondary | ICD-10-CM | POA: Insufficient documentation

## 2019-10-09 DIAGNOSIS — Y939 Activity, unspecified: Secondary | ICD-10-CM | POA: Diagnosis not present

## 2019-10-09 DIAGNOSIS — W458XXA Other foreign body or object entering through skin, initial encounter: Secondary | ICD-10-CM | POA: Diagnosis not present

## 2019-10-09 DIAGNOSIS — Y929 Unspecified place or not applicable: Secondary | ICD-10-CM | POA: Diagnosis not present

## 2019-10-09 DIAGNOSIS — Z87891 Personal history of nicotine dependence: Secondary | ICD-10-CM | POA: Insufficient documentation

## 2019-10-09 DIAGNOSIS — S91332A Puncture wound without foreign body, left foot, initial encounter: Secondary | ICD-10-CM | POA: Insufficient documentation

## 2019-10-09 DIAGNOSIS — B379 Candidiasis, unspecified: Secondary | ICD-10-CM

## 2019-10-09 DIAGNOSIS — T3695XA Adverse effect of unspecified systemic antibiotic, initial encounter: Secondary | ICD-10-CM

## 2019-10-09 MED ORDER — SULFAMETHOXAZOLE-TRIMETHOPRIM 800-160 MG PO TABS: 1.0000 | ORAL_TABLET | Freq: Two times a day (BID) | ORAL | 0 refills | Status: DC

## 2019-10-09 MED ORDER — BACITRACIN-NEOMYCIN-POLYMYXIN 400-5-5000 EX OINT
TOPICAL_OINTMENT | Freq: Once | CUTANEOUS | Status: AC
  Administered 2019-10-09: 23:00:00 via TOPICAL
  Filled 2019-10-09: qty 1

## 2019-10-09 MED ORDER — LIDOCAINE-EPINEPHRINE-TETRACAINE (LET) TOPICAL GEL
3.0000 mL | Freq: Once | TOPICAL | Status: AC
  Administered 2019-10-09: 3 mL via TOPICAL
  Filled 2019-10-09 (×2): qty 3

## 2019-10-09 MED ORDER — SULFAMETHOXAZOLE-TRIMETHOPRIM 800-160 MG PO TABS
1.0000 | ORAL_TABLET | Freq: Once | ORAL | Status: AC
  Administered 2019-10-09: 22:00:00 1 via ORAL
  Filled 2019-10-09: qty 1

## 2019-10-09 MED ORDER — FLUCONAZOLE 150 MG PO TABS: 150.0000 mg | ORAL_TABLET | Freq: Every day | ORAL | 0 refills | Status: DC

## 2019-10-09 NOTE — ED Notes (Signed)
Pt walking and stepped on a deer antler. Pt states the antler did not break. Pt was unable to find any supplies in the stores to clean the wound. Wound cleaned with NS. Pt tolerated well.

## 2019-10-09 NOTE — Discharge Instructions (Signed)
You were seen today for a puncture wound of your left foot.  Your x-ray was negative for any retained foreign body or bony abnormality.  You received 2 sutures today that you will need to follow-up with your PCP in 1 week for suture removal.  I prescribed you antibiotics twice daily for the next 7 days and Diflucan in case you develop a yeast infection.  Please take the coband off in 1 hour.

## 2019-10-09 NOTE — ED Provider Notes (Signed)
[facility] Emergency Department Provider Note ____________________________________________  Time seen: 2100  I have reviewed the triage vital signs and the nursing notes.  HISTORY  Chief Complaint  Foot Pain   HPI Renee Benjamin is a 38 y.o. female presents to the ER today with complaint of a puncture wound to her left foot.  She reports she stepped on a deer antler that was approximately 1 inch long.  She did get the entire antler out of her foot.  She was able to control the bleeding at home.  She did not cleanse the wound PTA.  Her last tetanus shot was in 05/2019, when she cut her finger.  Past Medical History:  Diagnosis Date  . ADHD (attention deficit hyperactivity disorder) 01/09/2016  . Allergy   . Anemia   . Anxiety   . Anxiety    separation anxiety  . Asthma   . Chronic right hip pain 04/10/2016  . Collagen vascular disease ([place])   . Endometriosis   . Fibromyalgia 01/09/2016  . Headache    otc med prn  . Hypothyroidism   . Plantar fasciitis of left foot 08.29.14   PLANTAR HEEL ,FLUID AROUND MEDIAL BAND  . Polycystic ovarian disease   . Polysubstance abuse ([place]) 01/22/2018   ER visit March 2019: Drug screen is positive for marijuana, cocaine, benzodiazepines, barbiturates, and opiates  . Stress fracture 08.15.14   RIGHT   . Thoracic back pain   . Thyroid disease   . Vitamin D deficiency disease     Patient Active Problem List   Diagnosis Date Noted  . Bipolar affective, mixed ([place]) 03/24/2019  . Anxiety 03/08/2019  . Pancreatitis 08/20/2018  . Right upper quadrant abdominal pain   . Polysubstance abuse ([place]) 01/22/2018  . Moderate recurrent major depression ([place]) 01/21/2018  . Cocaine abuse ([place]) 01/21/2018  . Cannabis abuse 01/21/2018  . Benzodiazepine abuse ([place]) 01/21/2018  . Barbiturate abuse ([place]) 01/21/2018  . Opiate abuse, episodic ([place]) 01/21/2018  . Allergic rhinitis 02/24/2017  . Vitamin B12 deficiency 02/24/2017  .  Chronic right hip pain 04/10/2016  . Controlled substance agreement signed 02/01/2016  . Obesity 02/01/2016  . ADHD (attention deficit hyperactivity disorder) 01/09/2016  . Fibromyalgia 01/09/2016  . Chronic fatigue 12/04/2015  . Torticollis, acute 10/08/2015  . Headache 10/02/2015  . Thoracic back pain 10/02/2015  . Lumbar pain 10/02/2015  . Arthralgia 10/02/2015  . Chronic tonsillitis 08/20/2015  . Migraine headache with aura 04/10/2015  . Hypothyroidism 04/10/2015  . PTSD (post-traumatic stress disorder) 04/10/2015  . Status post laparoscopic hysterectomy 01/03/2015  . Chronic female pelvic pain 11/23/2014  . Plantar fasciitis of left foot   . Stress fracture   . Asthma 12/16/2012  . Bipolar affective disorder, depressed, moderate ([place]) 12/16/2012    Past Surgical History:  Procedure Laterality Date  . ABDOMINAL HYSTERECTOMY  March 2016   Due to uterus being stiched into C Section incision  . APPENDECTOMY    . APPENDECTOMY  2008  . CESAREAN SECTION    . CESAREAN SECTION  2014  . LAPAROSCOPIC BILATERAL SALPINGECTOMY Bilateral 01/02/2015   Procedure: LAPAROSCOPIC BILATERAL SALPINGECTOMY;  Surgeon: Osborne Oman, MD;  Location: [facility];  Service: Gynecology;  Laterality: Bilateral;  . LAPAROSCOPIC HYSTERECTOMY N/A 01/02/2015   Procedure: HYSTERECTOMY TOTAL LAPAROSCOPIC;  Surgeon: Osborne Oman, MD;  Location: [facility];  Service: Gynecology;  Laterality: N/A;  . SHOULDER SURGERY Right   . SHOULDER SURGERY  2001   right  Prior to Admission medications   Medication Sig Start Date End Date Taking? Authorizing Provider  albuterol (PROVENTIL) (2.5 MG/3ML) 0.083% nebulizer solution Take 3 mLs (2.5 mg total) by nebulization every 6 (six) hours as needed for wheezing or shortness of breath. 07/29/19 08/28/19  Tyler Pita, MD  amphetamine-dextroamphetamine (ADDERALL) 20 MG tablet Take 20 mg by mouth 2 (two) times daily. 01/26/19   [provider]  ARIPiprazole  (ABILIFY) 10 MG tablet Take 0.5 tablets (5 mg total) by mouth at bedtime. 03/08/19   Lavella Hammock, MD  fexofenadine (ALLEGRA) 180 MG tablet Take 180 mg by mouth daily.    [provider]  fluconazole (DIFLUCAN) 150 MG tablet Take 1 tablet (150 mg total) by mouth daily. May repeat in 3 days if needed 10/09/19   Jearld Fenton, NP  hydrOXYzine (ATARAX/VISTARIL) 25 MG tablet Take 1-2 tablets (25-50 mg total) by mouth 4 (four) times daily as needed for anxiety. 12/22/18   Arta Silence, MD  levothyroxine (SYNTHROID, LEVOTHROID) 75 MCG tablet TAKE 1 TABLET BY MOUTH ONCE DAILY ON AN EMPTY STOMACH. WAIT 30 MINUTES BEFORE TAKING OTHER MEDS. 09/03/18   Poulose, Bethel Born, NP  methocarbamol (ROBAXIN) 500 MG tablet Take 1 tablet (500 mg total) by mouth every 8 (eight) hours as needed for muscle spasms. 08/26/19   Steele Sizer, MD  mometasone-formoterol (DULERA) 200-5 MCG/ACT AERO Inhale 2 puffs into the lungs 2 (two) times daily. 09/26/19   Tyler Pita, MD  montelukast (SINGULAIR) 10 MG tablet TAKE 1 TABLET BY MOUTH EVERY DAY 07/14/18   Lada, Satira Anis, MD  pregabalin (LYRICA) 50 MG capsule Take 1 capsule (50 mg total) by mouth 3 (three) times daily. Start once a day and go up slowly 08/26/19   Steele Sizer, MD  PROAIR HFA 108 [phone] Base) MCG/ACT inhaler INHALE 2 PUFFS BY MOUTH EVERY 6 HOURS IFNEEDED FOR WHEEZING OR SHORTNESS OF BREATH. Patient taking differently: Inhale 2 puffs into the lungs every 6 (six) hours as needed for wheezing or shortness of breath.  02/23/18   Poulose, Bethel Born, NP  sulfamethoxazole-trimethoprim (BACTRIM DS) 800-160 MG tablet Take 1 tablet by mouth 2 (two) times daily. 10/09/19   Jearld Fenton, NP    Allergies Depakote [divalproex sodium], Morphine, Prednisone, Propoxyphene, Tramadol, Tramadol, Darvocet [propoxyphene n-acetaminophen], and Valproic acid  Family History  Problem Relation Age of Onset  . Diabetes Father   . Osteoporosis Mother   .  Cancer Maternal Grandmother        breast  . Osteoporosis Maternal Grandmother     Social History Social History   Tobacco Use  . Smoking status: Former Smoker    Packs/day: 0.25    Years: 4.00    Pack years: 1.00    Types: Cigarettes    Quit date: 04/17/2012    Years since quitting: 7.4  . Smokeless tobacco: Never Used  Substance Use Topics  . Alcohol use: No    Alcohol/week: 0.0 standard drinks  . Drug use: No    Review of Systems  Constitutional: Negative for fever, chills or body aches. Cardiovascular: Negative for chest pain or chest tightness. Respiratory: Negative for cough or shortness of breath. Skin: Positive for puncture wound of left foot. Neurological: Negative for focal weakness, tingling or numbness. ____________________________________________  PHYSICAL EXAM:  VITAL SIGNS: ED Triage Vitals  Enc Vitals Group     BP 10/09/19 2020 137/85     Pulse Rate 10/09/19 2020 96     Resp 10/09/19  2020 17     Temp 10/09/19 2020 98 F (36.7 C)     Temp Source 10/09/19 2020 Oral     SpO2 10/09/19 2020 100 %     Weight 10/09/19 2018 220 lb 7.4 oz (100 kg)     Height 10/09/19 2018 5\' 8"  (1.727 m)     Head Circumference --      Peak Flow --      Pain Score 10/09/19 2018 5     Pain Loc --      Pain Edu? --      Excl. in [place]? --     Constitutional: Alert and oriented. Well appearing and in no distress. Cardiovascular: Normal rate, regular rhythm.  Pedal pulses 2+ bilaterally. Respiratory: Normal respiratory effort. No wheezes/rales/rhonchi. Musculoskeletal: Normal flexion, extension and rotation of the left ankle.  Gait slow and steady without device. Neurologic:  Normal speech and language. No gross focal neurologic deficits are appreciated. Skin: 0.5 cm puncture wound of the plantar aspect of left midfoot. ____________________________________________   RADIOLOGY   Imaging Orders     DG Foot Complete Left IMPRESSION:  No acute osseous abnormalities.     ____________________________________________  PROCEDURES  .Marland KitchenLaceration Repair  Date/Time: 10/09/2019 10:27 PM Performed by: Jearld Fenton, NP Authorized by: Jearld Fenton, NP   Consent:    Consent obtained:  Verbal   Consent given by:  Patient   Risks discussed:  Infection, pain and poor cosmetic result   Alternatives discussed:  No treatment Anesthesia (see MAR for exact dosages):    Anesthesia method:  Topical application   Topical anesthetic:  LET Laceration details:    Location:  Foot   Foot location:  Sole of L foot   Length (cm):  0.5   Depth (mm):  10 Repair type:    Repair type:  Simple Pre-procedure details:    Preparation:  Patient was prepped and draped in usual sterile fashion and imaging obtained to evaluate for foreign bodies Exploration:    Hemostasis achieved with:  LET   Wound exploration: entire depth of wound probed and visualized     Contaminated: no   Treatment:    Area cleansed with:  Betadine and saline   Amount of cleaning:  Extensive   Irrigation solution:  Sterile saline   Irrigation method:  Syringe   Visualized foreign bodies/material removed: no   Skin repair:    Repair method:  Sutures   Suture size:  4-0   Suture material:  Nylon   Number of sutures:  2 Approximation:    Approximation:  Close Post-procedure details:    Dressing:  Antibiotic ointment and bulky dressing   Patient tolerance of procedure:  Tolerated well, no immediate complications    ____________________________________________  INITIAL IMPRESSION / ASSESSMENT AND PLAN / ED COURSE  Puncture Wound, Left Foot:  Xray  Wound cleansed Sutures placed- see procedure note Aftercare instructions provided Septra DS 1 tab PO in ER RX for Septra DS 1 tab PO BID x 7 days RX for Diflcuan for antibiotic induced yeast infection Follow up instructions provided ____________________________________________  FINAL CLINICAL IMPRESSION(S) / ED DIAGNOSES  Final  diagnoses:  Puncture wound of left foot, initial encounter  Antibiotic-induced yeast infection   Webb Silversmith, NP    Jearld Fenton, NP 10/09/19 2231    Arta Silence, MD 10/10/19 [phone]

## 2019-10-09 NOTE — ED Triage Notes (Signed)
Pt comes POV after stepping on a deer antler. Pt states it went in about 1in. Pt has a puncture wound on bottom of left foot. Pt ambulatory. Bleeding controlled. Pt states that she wouldn't have come except that she can't find anything to clean it with at any stores.

## 2019-10-14 ENCOUNTER — Other Ambulatory Visit
Admission: RE | Admit: 2019-10-14 | Discharge: 2019-10-14 | Disposition: A | Discharge status: Home / Self Care | Payer: Medicaid Other | Source: Home / Self Care | Attending: Pulmonary Disease | Admitting: Pulmonary Disease

## 2019-10-14 ENCOUNTER — Other Ambulatory Visit: Payer: Self-pay

## 2019-10-14 ENCOUNTER — Ambulatory Visit
Admission: RE | Admit: 2019-10-14 | Discharge: 2019-10-14 | Disposition: A | Discharge status: Home / Self Care | Payer: Medicaid Other | Source: Ambulatory Visit | Attending: Pulmonary Disease | Admitting: Pulmonary Disease

## 2019-10-14 ENCOUNTER — Encounter: Payer: Self-pay | Admitting: Pulmonary Disease

## 2019-10-14 ENCOUNTER — Ambulatory Visit (INDEPENDENT_AMBULATORY_CARE_PROVIDER_SITE_OTHER): Payer: Medicaid Other | Admitting: Pulmonary Disease

## 2019-10-14 ENCOUNTER — Ambulatory Visit
Admission: RE | Admit: 2019-10-14 | Discharge: 2019-10-14 | Disposition: A | Discharge status: Home / Self Care | Payer: Medicaid Other | Attending: Pulmonary Disease | Admitting: Pulmonary Disease

## 2019-10-14 VITALS — BP 128/72 | HR 90 | Temp 97.2°F | Ht 68.0 in | Wt 219.0 lb

## 2019-10-14 DIAGNOSIS — J301 Allergic rhinitis due to pollen: Secondary | ICD-10-CM

## 2019-10-14 DIAGNOSIS — Z8701 Personal history of pneumonia (recurrent): Secondary | ICD-10-CM

## 2019-10-14 DIAGNOSIS — F1910 Other psychoactive substance abuse, uncomplicated: Secondary | ICD-10-CM

## 2019-10-14 DIAGNOSIS — J4540 Moderate persistent asthma, uncomplicated: Secondary | ICD-10-CM | POA: Insufficient documentation

## 2019-10-14 LAB — CBC WITH DIFFERENTIAL/PLATELET
Abs Immature Granulocytes: 0.02 10*3/uL (ref 0.00–0.07)
Basophils Absolute: 0.1 10*3/uL (ref 0.0–0.1)
Basophils Relative: 1 %
Eosinophils Absolute: 0.3 10*3/uL (ref 0.0–0.5)
Eosinophils Relative: 3 %
HCT: 38.8 % (ref 36.0–46.0)
Hemoglobin: 13.7 g/dL (ref 12.0–15.0)
Immature Granulocytes: 0 %
Lymphocytes Relative: 37 %
Lymphs Abs: 2.9 10*3/uL (ref 0.7–4.0)
MCH: 30.8 pg (ref 26.0–34.0)
MCHC: 35.3 g/dL (ref 30.0–36.0)
MCV: 87.2 fL (ref 80.0–100.0)
Monocytes Absolute: 0.6 10*3/uL (ref 0.1–1.0)
Monocytes Relative: 8 %
Neutro Abs: 4 10*3/uL (ref 1.7–7.7)
Neutrophils Relative %: 51 %
Platelets: 328 10*3/uL (ref 150–400)
RBC: 4.45 MIL/uL (ref 3.87–5.11)
RDW: 12 % (ref 11.5–15.5)
WBC: 7.8 10*3/uL (ref 4.0–10.5)
nRBC: 0 % (ref 0.0–0.2)

## 2019-10-14 NOTE — Progress Notes (Signed)
@Patient  ID: Renee Benjamin, female    DOB: 06/30/1981, 38 y.o.   MRN: [id number]  Chief Complaint  Patient presents with  . Follow-up    pt states breathing has improved with Dulera. c/o sob with exertion, prod cough with clear to greenish mucus mainly int morning and occ wheezing.     Referring provider: Arnetha Courser, MD  HPI:  38 year old female former smoker followed in our office for shortness of breath  PMH: Obesity, migraine, PTSD, hypothyroidism, ADHD, right hip pain, history of polysubstance abuse (THC and cocaine, opiates), anxiety, bipolar Smoker/ Smoking History: Former smoker.  Quit 2013.  1 pack year Maintenance: Dulera 200 Pt of: Dr. Patsey Berthold  10/14/2019  - Visit   38 year old female former smoker (1 pack year) initially consulted with our practice on 09/19/2019.  Patient was trialed at that time on [facility] which was not affordable so would switch to [facility] 200.  Patient reporting today and she feels that her cough and her wheeze have improved since starting Dulera 200.  She still has an occasional cough as well as some congestion specifically in the morning.  All of her symptoms started after an episode of inhaling sand per patient.  She reports she was hospitalized for this in September/2020.  She was hospitalized at [facility].  Her last x-ray on 07/12/2019 in care everywhere shows that it was clear.  She still was admitted due to acute worsening shortness of breath.  She was evaluated for allergies 3 to 4 years ago and she believes that this was done at [facility] and she was only positive for cockroach.  She does have a cat and a dog that lives with her.  She has never had formal pulmonary function testing.  She is maintained on Singulair by primary care.  Tests:   08/16/2019-CBC with differential-eosinophils relative 1, eosinophils absolute 86  FENO:  No results found for: NITRICOXIDE  PFT: No flowsheet data found.  WALK:  No flowsheet data  found.  Imaging: DG Foot Complete Left  Result Date: 10/09/2019 CLINICAL DATA:  Stepped on a deer antler, puncture wound and pain at bottom of foot EXAM: LEFT FOOT - COMPLETE 3+ VIEW COMPARISON:  06/02/2018 FINDINGS: Osseous mineralization normal. Joint spaces preserved. No acute fracture, dislocation, or bone destruction. No radiopaque foreign body or soft tissue gas. Question soft tissue swelling at plantar aspect of foot at the level of the metatarsals. IMPRESSION: No acute osseous abnormalities. Electronically Signed   By: Lavonia Dana M.D.   On: 10/09/2019 21:46    Lab Results:  CBC    Component Value Date/Time   WBC 8.6 08/16/2019 0000   RBC 4.50 08/16/2019 0000   HGB 14.1 08/16/2019 0000   HGB 13.6 10/02/2015 0925   HCT 41.5 08/16/2019 0000   HCT 37.6 10/02/2015 0925   PLT 312 08/16/2019 0000   PLT 259 10/02/2015 0925   MCV 92.2 08/16/2019 0000   MCV 91 10/02/2015 0925   MCV 93 03/16/2014 0800   MCH 31.3 08/16/2019 0000   MCHC 34.0 08/16/2019 0000   RDW 12.7 08/16/2019 0000   RDW 12.3 10/02/2015 0925   RDW 12.5 03/16/2014 0800   LYMPHSABS 2,838 08/16/2019 0000   LYMPHSABS 2.5 10/02/2015 0925   LYMPHSABS 2.4 03/16/2014 0800   MONOABS 0.4 03/08/2019 1240   MONOABS 0.5 03/16/2014 0800   EOSABS 86 08/16/2019 0000   EOSABS 0.0 04/10/2015 1008   EOSABS 0.0 03/16/2014 0800   BASOSABS 60 08/16/2019 0000  BASOSABS 0.0 04/10/2015 1008   BASOSABS 0.0 03/16/2014 0800    BMET    Component Value Date/Time   NA 136 06/15/2019 0118   NA 140 04/10/2015 1008   NA 140 03/16/2014 0800   K 3.9 08/16/2019 0000   K 3.8 03/16/2014 0800   CL 104 06/15/2019 0118   CL 106 03/16/2014 0800   CO2 23 06/15/2019 0118   CO2 29 03/16/2014 0800   GLUCOSE 111 (H) 06/15/2019 0118   GLUCOSE 88 03/16/2014 0800   BUN 10 06/15/2019 0118   BUN 8 04/10/2015 1008   BUN 6 (L) 03/16/2014 0800   CREATININE 0.70 06/15/2019 0118   CREATININE 0.63 03/16/2014 0800   CALCIUM 8.9 06/15/2019 0118    CALCIUM 8.9 03/16/2014 0800   GFRNONAA >60 06/15/2019 0118   GFRNONAA >60 03/16/2014 0800   GFRAA >60 06/15/2019 0118   GFRAA >60 03/16/2014 0800    BNP    Component Value Date/Time   BNP 27.0 03/08/2019 1258    ProBNP No results found for: PROBNP  Specialty Problems      Pulmonary Problems   Asthma   Chronic tonsillitis    She has been on two rounds of antibiotics; rapid strep today negative; throat culture pending; concern for deeper tissue mass or abscess given her level of pain and failure of antibiotics; will get neck CT and refer to ENT      Allergic rhinitis      Allergies  Allergen Reactions  . Depakote [Divalproex Sodium] Hives  . Morphine Hives  . Prednisone     Possibly mania  . Propoxyphene Other (See Comments)  . Tramadol Nausea Only  . Tramadol Nausea And Vomiting and Other (See Comments)  . Darvocet [Propoxyphene N-Acetaminophen] Rash  . Valproic Acid Rash    Immunization History  Administered Date(s) Administered  . Influenza,inj,Quad PF,6+ Mos 08/21/2018, 07/21/2019  . Influenza-Unspecified 06/12/2015, 07/16/2016  . Tdap 11/04/2011    Past Medical History:  Diagnosis Date  . ADHD (attention deficit hyperactivity disorder) 01/09/2016  . Allergy   . Anemia   . Anxiety   . Anxiety    separation anxiety  . Asthma   . Chronic right hip pain 04/10/2016  . Collagen vascular disease ([place])   . Endometriosis   . Fibromyalgia 01/09/2016  . Headache    otc med prn  . Hypothyroidism   . Plantar fasciitis of left foot 08.29.14   PLANTAR HEEL ,FLUID AROUND MEDIAL BAND  . Polycystic ovarian disease   . Polysubstance abuse ([place]) 01/22/2018   ER visit March 2019: Drug screen is positive for marijuana, cocaine, benzodiazepines, barbiturates, and opiates  . Stress fracture 08.15.14   RIGHT   . Thoracic back pain   . Thyroid disease   . Vitamin D deficiency disease     Tobacco History: Social History   Tobacco Use  Smoking Status Former Smoker  .  Packs/day: 0.25  . Years: 4.00  . Pack years: 1.00  . Types: Cigarettes  . Quit date: 04/17/2012  . Years since quitting: 7.4  Smokeless Tobacco Never Used   Counseling given: Yes   Continue to not smoke  Outpatient Encounter Medications as of 10/14/2019  Medication Sig  . amphetamine-dextroamphetamine (ADDERALL) 20 MG tablet Take 20 mg by mouth 2 (two) times daily.  . ARIPiprazole (ABILIFY) 10 MG tablet Take 0.5 tablets (5 mg total) by mouth at bedtime.  . fexofenadine (ALLEGRA) 180 MG tablet Take 180 mg by mouth daily.  . fluconazole (DIFLUCAN)  150 MG tablet Take 1 tablet (150 mg total) by mouth daily. May repeat in 3 days if needed  . hydrOXYzine (ATARAX/VISTARIL) 25 MG tablet Take 1-2 tablets (25-50 mg total) by mouth 4 (four) times daily as needed for anxiety.  Marland Kitchen levothyroxine (SYNTHROID, LEVOTHROID) 75 MCG tablet TAKE 1 TABLET BY MOUTH ONCE DAILY ON AN EMPTY STOMACH. WAIT 30 MINUTES BEFORE TAKING OTHER MEDS.  . methocarbamol (ROBAXIN) 500 MG tablet Take 1 tablet (500 mg total) by mouth every 8 (eight) hours as needed for muscle spasms.  . mometasone-formoterol (DULERA) 200-5 MCG/ACT AERO Inhale 2 puffs into the lungs 2 (two) times daily.  . montelukast (SINGULAIR) 10 MG tablet TAKE 1 TABLET BY MOUTH EVERY DAY  . pregabalin (LYRICA) 50 MG capsule Take 1 capsule (50 mg total) by mouth 3 (three) times daily. Start once a day and go up slowly  . PROAIR HFA 108 (90 Base) MCG/ACT inhaler INHALE 2 PUFFS BY MOUTH EVERY 6 HOURS IFNEEDED FOR WHEEZING OR SHORTNESS OF BREATH. (Patient taking differently: Inhale 2 puffs into the lungs every 6 (six) hours as needed for wheezing or shortness of breath. )  . sulfamethoxazole-trimethoprim (BACTRIM DS) 800-160 MG tablet Take 1 tablet by mouth 2 (two) times daily.  Marland Kitchen albuterol (PROVENTIL) (2.5 MG/3ML) 0.083% nebulizer solution Take 3 mLs (2.5 mg total) by nebulization every 6 (six) hours as needed for wheezing or shortness of breath.   No  facility-administered encounter medications on file as of 10/14/2019.     Review of Systems  Review of Systems  Constitutional: Positive for fatigue. Negative for activity change and fever.  HENT: Positive for congestion and rhinorrhea. Negative for sinus pressure, sinus pain and sore throat.   Respiratory: Positive for cough (improved ), shortness of breath and wheezing (improved ).   Cardiovascular: Negative for chest pain and palpitations.  Gastrointestinal: Negative for diarrhea, nausea and vomiting.  Musculoskeletal: Negative for arthralgias.  Allergic/Immunologic: Positive for environmental allergies (cockroach).  Neurological: Negative for dizziness.  Psychiatric/Behavioral: Negative for sleep disturbance. The patient is not nervous/anxious.      Physical Exam  BP 128/72 (BP Location: Left Arm)   Pulse 90   Temp (!) 97.2 F (36.2 C) (Temporal)   Ht 5\' 8"  (1.727 m)   Wt 219 lb (99.3 kg)   LMP 10/30/2014   SpO2 98%   BMI 33.30 kg/m   Wt Readings from Last 5 Encounters:  10/14/19 219 lb (99.3 kg)  10/09/19 220 lb 7.4 oz (100 kg)  09/19/19 220 lb 6.4 oz (100 kg)  08/16/19 221 lb 8 oz (100.5 kg)  07/30/19 225 lb (102.1 kg)    BMI Readings from Last 5 Encounters:  10/14/19 33.30 kg/m  10/09/19 33.52 kg/m  09/19/19 33.51 kg/m  08/16/19 33.68 kg/m  07/30/19 34.21 kg/m     Physical Exam Vitals and nursing note reviewed.  Constitutional:      General: She is not in acute distress.    Appearance: She is obese.  HENT:     Head: Normocephalic and atraumatic.     Right Ear: External ear normal.     Left Ear: External ear normal.     Nose: Rhinorrhea present. No congestion.     Mouth/Throat:     Mouth: Mucous membranes are moist.     Pharynx: Oropharynx is clear.  Eyes:     Pupils: Pupils are equal, round, and reactive to light.  Cardiovascular:     Rate and Rhythm: Normal rate and regular rhythm.  Pulses: Normal pulses.     Heart sounds: Normal heart  sounds. No murmur.  Pulmonary:     Effort: Pulmonary effort is normal. No respiratory distress.     Breath sounds: No decreased air movement. No decreased breath sounds, wheezing or rales.  Abdominal:     General: Abdomen is flat. Bowel sounds are normal.     Palpations: Abdomen is soft.  Musculoskeletal:     Cervical back: Normal range of motion.  Skin:    General: Skin is warm and dry.     Capillary Refill: Capillary refill takes less than 2 seconds.  Neurological:     General: No focal deficit present.     Mental Status: She is alert and oriented to person, place, and time. Mental status is at baseline.     Gait: Gait normal.  Psychiatric:        Mood and Affect: Mood is anxious.        Speech: Speech normal.        Behavior: Behavior is hyperactive.        Thought Content: Thought content normal.        Judgment: Judgment normal.       Assessment & Plan:   Allergic rhinitis Plan: Continue daily antihistamine Continue Singulair Start nasal saline rinses 1-2 times a day We will request records from [facility] for your previous allergy testing Lab work today  Asthma Plan: Continue Dulera 200 Lab work today Chest x-ray today Continue rescue inhaler as needed We will request allergy testing from [facility] We will order pulmonary function testing Follow-up with our office in 6 to 8 weeks Continue Singulair Continue daily antihistamine  Polysubstance abuse ([place]) Patient denies polysubstance abuse today Patient encouraged to not abuse illicit drugs, THC or other controlled substances  History of aspiration pneumonia Patient reports that she aspirated sand in September/2020 Patient was hospitalized at [facility], I do not have these records Chest x-ray from 07/12/2019 in [place] report shows clear lungs Patient feels that her breathing has worsened ever since this episode  Plan: Chest x-ray today May need to consider CT imaging in the  future    Return in about 6 weeks (around 11/25/2019), or if symptoms worsen or fail to improve, for [facility] - Dr. Patsey Berthold.   Lauraine Rinne, NP 10/14/2019   This appointment was 28 minutes long with over 50% of the time in direct face-to-face patient care, assessment, plan of care, and follow-up.

## 2019-10-14 NOTE — Addendum Note (Signed)
Addended by: Santiago Bur on: 10/14/2019 10:00 AM   Modules accepted: Orders

## 2019-10-14 NOTE — Assessment & Plan Note (Addendum)
Patient reports that she aspirated sand in September/2020 Patient was hospitalized at [facility], I do not have these records Chest x-ray from 07/12/2019 in [place] report shows clear lungs Patient feels that her breathing has worsened ever since this episode  Plan: Chest x-ray today May need to consider CT imaging in the future Schedule pulmonary function testing

## 2019-10-14 NOTE — Patient Instructions (Addendum)
You were seen today by Lauraine Rinne, NP  for:   1. Moderate persistent asthma without complication  - IgE; Future - CBC w/Diff; Future - Pulmonary Function Test [facility] Only; Future  Dulera 200 >>> 2 puffs in the morning right when you wake up, rinse out your mouth after use, 12 hours later 2 puffs, rinse after use >>> Take this daily, no matter what >>> This is not a rescue inhaler   Only use your albuterol as a rescue medication to be used if you can't catch your breath by resting or doing a relaxed purse lip breathing pattern.  - The less you use it, the better it will work when you need it. - Ok to use up to 2 puffs  every 4 hours if you must but call for immediate appointment if use goes up over your usual need - Don't leave home without it !!  (think of it like the spare tire for your car)   Continue Singulair    2. Seasonal allergic rhinitis due to pollen  - IgE; Future - CBC w/Diff; Future - Pulmonary Function Test [facility] Only; Future  Continue Singulair  Start nasal saline rinses 1-2 times daily  3. History of aspiration pneumonia  - CXR 2view; Future   We recommend today:  Orders Placed This Encounter  Procedures  . CXR 2view    Standing Status:   Future    Standing Expiration Date:   12/13/2020    Order Specific Question:   Reason for Exam (SYMPTOM  OR DIAGNOSIS REQUIRED)    Answer:   sob, cough    Order Specific Question:   Preferred imaging location?    Answer:   Internal    Order Specific Question:   Is the patient pregnant?    Answer:   No  . IgE    Standing Status:   Future    Standing Expiration Date:   10/13/2020  . CBC w/Diff    Standing Status:   Future    Standing Expiration Date:   10/13/2020  . Pulmonary Function Test [facility] Only    Standing Status:   Future    Standing Expiration Date:   10/13/2020    Order Specific Question:   Full PFT: includes the following: basic spirometry, spirometry pre & post bronchodilator, diffusion capacity (DLCO),  lung volumes    Answer:   Full PFT   Orders Placed This Encounter  Procedures  . CXR 2view  . IgE  . CBC w/Diff  . Pulmonary Function Test [facility] Only   No orders of the defined types were placed in this encounter.   Follow Up:    Return in about 6 weeks (around 11/25/2019), or if symptoms worsen or fail to improve, for [facility] - Dr. Patsey Berthold.   Please do your part to reduce the spread of COVID-19:      Reduce your risk of any infection  and COVID19 by using the similar precautions used for avoiding the common cold or flu:  Marland Kitchen Wash your hands often with soap and warm water for at least 20 seconds.  If soap and water are not readily available, use an alcohol-based hand sanitizer with at least 60% alcohol.  . If coughing or sneezing, cover your mouth and nose by coughing or sneezing into the elbow areas of your shirt or coat, into a tissue or into your sleeve (not your hands). Langley Gauss A MASK when in public  . Avoid shaking hands with others  and consider head nods or verbal greetings only. . Avoid touching your eyes, nose, or mouth with unwashed hands.  . Avoid close contact with people who are sick. . Avoid places or events with large numbers of people in one location, like concerts or sporting events. . If you have some symptoms but not all symptoms, continue to monitor at home and seek medical attention if your symptoms worsen. . If you are having a medical emergency, call 911.   [place] / e-Visit: [email or web address]         [facility]: [facility]: [id number]                   [facility]: [id number]     It is flu season:   >>> Best ways to protect herself from the flu: Receive the yearly flu vaccine, practice good hand hygiene washing with soap and also using hand sanitizer when available, eat a  nutritious meals, get adequate rest, hydrate appropriately   Please contact the office if your symptoms worsen or you have concerns that you are not improving.   Thank you for choosing [facility] for your healthcare, and for allowing [place] to partner with you on your healthcare journey. I am thankful to be able to provide care to you today.   Wyn Quaker FNP-C

## 2019-10-14 NOTE — Assessment & Plan Note (Signed)
Plan: Continue Dulera 200 Lab work today Chest x-ray today Continue rescue inhaler as needed We will request allergy testing from [facility] We will order pulmonary function testing Follow-up with our office in 6 to 8 weeks Continue Singulair Continue daily antihistamine

## 2019-10-14 NOTE — Assessment & Plan Note (Signed)
Plan: Continue daily antihistamine Continue Singulair Start nasal saline rinses 1-2 times a day We will request records from [facility] for your previous allergy testing Lab work today

## 2019-10-14 NOTE — Assessment & Plan Note (Signed)
Patient denies polysubstance abuse today Patient encouraged to not abuse illicit drugs, THC or other controlled substances

## 2019-10-16 LAB — IGE: IgE (Immunoglobulin E), Serum: 7 IU/mL (ref 6–495)

## 2019-10-20 ENCOUNTER — Telehealth: Payer: Self-pay | Admitting: Pulmonary Disease

## 2019-10-20 ENCOUNTER — Ambulatory Visit (INDEPENDENT_AMBULATORY_CARE_PROVIDER_SITE_OTHER): Payer: Medicaid Other | Admitting: Family Medicine

## 2019-10-20 ENCOUNTER — Other Ambulatory Visit: Payer: Self-pay

## 2019-10-20 ENCOUNTER — Encounter: Payer: Self-pay | Admitting: Family Medicine

## 2019-10-20 VITALS — Ht 68.0 in | Wt 217.0 lb

## 2019-10-20 DIAGNOSIS — G8929 Other chronic pain: Secondary | ICD-10-CM

## 2019-10-20 DIAGNOSIS — M5440 Lumbago with sciatica, unspecified side: Secondary | ICD-10-CM

## 2019-10-20 DIAGNOSIS — J4520 Mild intermittent asthma, uncomplicated: Secondary | ICD-10-CM

## 2019-10-20 DIAGNOSIS — M797 Fibromyalgia: Secondary | ICD-10-CM | POA: Diagnosis not present

## 2019-10-20 DIAGNOSIS — E039 Hypothyroidism, unspecified: Secondary | ICD-10-CM | POA: Diagnosis not present

## 2019-10-20 DIAGNOSIS — F3160 Bipolar disorder, current episode mixed, unspecified: Secondary | ICD-10-CM | POA: Diagnosis not present

## 2019-10-20 MED ORDER — LEVOTHYROXINE SODIUM 75 MCG PO TABS: 75.0000 ug | ORAL_TABLET | Freq: Every day | ORAL | 5 refills | Status: DC

## 2019-10-20 MED ORDER — PREGABALIN 75 MG PO CAPS: 75.0000 mg | ORAL_CAPSULE | Freq: Three times a day (TID) | ORAL | 2 refills | Status: DC

## 2019-10-20 MED ORDER — METHOCARBAMOL 500 MG PO TABS: 500.0000 mg | ORAL_TABLET | Freq: Three times a day (TID) | ORAL | 2 refills | Status: DC | PRN

## 2019-10-20 NOTE — Telephone Encounter (Signed)
ATC to relay date/time of covid test- unable to leave vm due to mailbox being full.  10/24/2019 prior to 11:00 [facility]

## 2019-10-20 NOTE — Progress Notes (Signed)
Name: Renee Benjamin   MRN: [id number]    DOB: Jan 05, 1981   Date:10/20/2019       Progress Note  Subjective  Chief Complaint  Chief Complaint  Patient presents with  . Follow-up    3 month follow up  . Pain    Leg and Hip Pain    I connected with  Fritzi Mandes  on 10/20/19 at  7:40 AM EST by a video enabled telemedicine application and verified that I am speaking with the correct person using two identifiers.  I discussed the limitations of evaluation and management by telemedicine and the availability of in person appointments. The patient expressed understanding and agreed to proceed. Staff also discussed with the patient that there may be a patient responsible charge related to this service. Patient Location: at home  Provider Location: [facility]   HPI  FMS: she states cold weather makes it works, she states stiffness is worse in am, Lyrica has helped, decreased the pain on arms and legs, pain level is now  5-6/10, sometimes goes up to 8/10  Bipolar mixed: she is under the care of [employer] - and is on medication. She is compliant, she states mood goes up and down but stable.She states she would like for me to manage her medication, I explained she needs to see the psychiatrist.   Chronic back pain with exacerbation; under the care of Dr. Lacinda Axon, unable to get MRI a this time, needs to wait longer. Thinking about epidural injections but not opening until Feb 2021 . She is taking 1600 mg of Ibuprofen, explained it will cause her kidney to shut down. She states she states she needs to be able to work and cannot due without pain medication. She states she wants to go to the pain clinic to try to get some relief. She states PT is too painful   Asthma: she is under the care of pulmonologist, she will have spirometry done. She is still taking on Dulera, she has SOB and  Wheezing daily  but cough is only in am's.   Hypothyroidism: she takes levothyroxine  75 mcg daily and denies hair loss, she is always tired ( unchanged) she denies constipation  Patient Active Problem List   Diagnosis Date Noted  . History of aspiration pneumonia 10/14/2019  . Bipolar affective, mixed ([place]) 03/24/2019  . Anxiety 03/08/2019  . Pancreatitis 08/20/2018  . Right upper quadrant abdominal pain   . Polysubstance abuse ([place]) 01/22/2018  . Moderate recurrent major depression ([place]) 01/21/2018  . Cocaine abuse ([place]) 01/21/2018  . Cannabis abuse 01/21/2018  . Benzodiazepine abuse ([place]) 01/21/2018  . Barbiturate abuse ([place]) 01/21/2018  . Opiate abuse, episodic ([place]) 01/21/2018  . Allergic rhinitis 02/24/2017  . Vitamin B12 deficiency 02/24/2017  . Chronic right hip pain 04/10/2016  . Controlled substance agreement signed 02/01/2016  . Obesity 02/01/2016  . ADHD (attention deficit hyperactivity disorder) 01/09/2016  . Fibromyalgia 01/09/2016  . Chronic fatigue 12/04/2015  . Torticollis, acute 10/08/2015  . Headache 10/02/2015  . Thoracic back pain 10/02/2015  . Lumbar pain 10/02/2015  . Arthralgia 10/02/2015  . Chronic tonsillitis 08/20/2015  . Migraine headache with aura 04/10/2015  . Hypothyroidism 04/10/2015  . PTSD (post-traumatic stress disorder) 04/10/2015  . Status post laparoscopic hysterectomy 01/03/2015  . Chronic female pelvic pain 11/23/2014  . Plantar fasciitis of left foot   . Stress fracture   . Asthma 12/16/2012  . Bipolar affective disorder, depressed, moderate ([place]) 12/16/2012  Past Surgical History:  Procedure Laterality Date  . ABDOMINAL HYSTERECTOMY  March 2016   Due to uterus being stiched into C Section incision  . APPENDECTOMY    . APPENDECTOMY  2008  . CESAREAN SECTION    . CESAREAN SECTION  2014  . LAPAROSCOPIC BILATERAL SALPINGECTOMY Bilateral 01/02/2015   Procedure: LAPAROSCOPIC BILATERAL SALPINGECTOMY;  Surgeon: Osborne Oman, MD;  Location: [facility];  Service: Gynecology;  Laterality: Bilateral;  . LAPAROSCOPIC  HYSTERECTOMY N/A 01/02/2015   Procedure: HYSTERECTOMY TOTAL LAPAROSCOPIC;  Surgeon: Osborne Oman, MD;  Location: [facility];  Service: Gynecology;  Laterality: N/A;  . SHOULDER SURGERY Right   . SHOULDER SURGERY  2001   right    Family History  Problem Relation Age of Onset  . Diabetes Father   . Osteoporosis Mother   . Cancer Maternal Grandmother        breast  . Osteoporosis Maternal Grandmother     Social History   Socioeconomic History  . Marital status: Single    Spouse name: Not on file  . Number of children: 1  . Years of education: Not on file  . Highest education level: GED or equivalent  Occupational History  . Not on file  Tobacco Use  . Smoking status: Former Smoker    Packs/day: 0.25    Years: 4.00    Pack years: 1.00    Types: Cigarettes    Quit date: 04/17/2012    Years since quitting: 7.5  . Smokeless tobacco: Never Used  Substance and Sexual Activity  . Alcohol use: No    Alcohol/week: 0.0 standard drinks  . Drug use: No  . Sexual activity: Yes    Partners: Male    Birth control/protection: None  Other Topics Concern  . Not on file  Social History Narrative   ** Merged History Encounter **       Social Determinants of Health   Financial Resource Strain: Medium Risk  . Difficulty of Paying Living Expenses: Somewhat hard  Food Insecurity: No Food Insecurity  . Worried About Charity fundraiser in the Last Year: Never true  . Ran Out of Food in the Last Year: Never true  Transportation Needs: No Transportation Needs  . Lack of Transportation (Medical): No  . Lack of Transportation (Non-Medical): No  Physical Activity: Sufficiently Active  . Days of Exercise per Week: 5 days  . Minutes of Exercise per Session: 60 min  Stress: No Stress Concern Present  . Feeling of Stress : Only a little  Social Connections: Somewhat Isolated  . Frequency of Communication with Friends and Family: More than three times a week  . Frequency of Social Gatherings  with Friends and Family: Never  . Attends Religious Services: Never  . Active Member of Clubs or Organizations: No  . Attends Archivist Meetings: Never  . Marital Status: Living with partner  Intimate Partner Violence: Not At Risk  . Fear of Current or Ex-Partner: No  . Emotionally Abused: No  . Physically Abused: No  . Sexually Abused: No     Current Outpatient Medications:  .  amphetamine-dextroamphetamine (ADDERALL) 20 MG tablet, Take 20 mg by mouth 2 (two) times daily., Disp: , Rfl:  .  ARIPiprazole (ABILIFY) 10 MG tablet, Take 0.5 tablets (5 mg total) by mouth at bedtime., Disp: 30 tablet, Rfl: 0 .  fexofenadine (ALLEGRA) 180 MG tablet, Take 180 mg by mouth daily., Disp: , Rfl:  .  hydrOXYzine (ATARAX/VISTARIL) 25 MG  tablet, Take 1-2 tablets (25-50 mg total) by mouth 4 (four) times daily as needed for anxiety., Disp: 30 tablet, Rfl: 0 .  levothyroxine (SYNTHROID, LEVOTHROID) 75 MCG tablet, TAKE 1 TABLET BY MOUTH ONCE DAILY ON AN EMPTY STOMACH. WAIT 30 MINUTES BEFORE TAKING OTHER MEDS., Disp: 30 tablet, Rfl: 5 .  methocarbamol (ROBAXIN) 500 MG tablet, Take 1 tablet (500 mg total) by mouth every 8 (eight) hours as needed for muscle spasms., Disp: 90 tablet, Rfl: 0 .  mometasone-formoterol (DULERA) 200-5 MCG/ACT AERO, Inhale 2 puffs into the lungs 2 (two) times daily., Disp: 13 g, Rfl: 2 .  montelukast (SINGULAIR) 10 MG tablet, TAKE 1 TABLET BY MOUTH EVERY DAY, Disp: 30 tablet, Rfl: 11 .  pregabalin (LYRICA) 50 MG capsule, Take 1 capsule (50 mg total) by mouth 3 (three) times daily. Start once a day and go up slowly, Disp: 90 capsule, Rfl: 0 .  PROAIR HFA 108 (90 Base) MCG/ACT inhaler, INHALE 2 PUFFS BY MOUTH EVERY 6 HOURS IFNEEDED FOR WHEEZING OR SHORTNESS OF BREATH. (Patient taking differently: Inhale 2 puffs into the lungs every 6 (six) hours as needed for wheezing or shortness of breath. ), Disp: 8.5 g, Rfl: 0 .  albuterol (PROVENTIL) (2.5 MG/3ML) 0.083% nebulizer solution,  Take 3 mLs (2.5 mg total) by nebulization every 6 (six) hours as needed for wheezing or shortness of breath., Disp: 75 mL, Rfl: 5 .  fluconazole (DIFLUCAN) 150 MG tablet, Take 1 tablet (150 mg total) by mouth daily. May repeat in 3 days if needed (Patient not taking: Reported on 10/20/2019), Disp: 2 tablet, Rfl: 0 .  sulfamethoxazole-trimethoprim (BACTRIM DS) 800-160 MG tablet, Take 1 tablet by mouth 2 (two) times daily. (Patient not taking: Reported on 10/20/2019), Disp: 14 tablet, Rfl: 0  Allergies  Allergen Reactions  . Depakote [Divalproex Sodium] Hives  . Morphine Hives  . Prednisone     Possibly mania  . Propoxyphene Other (See Comments)  . Tramadol Nausea Only  . Tramadol Nausea And Vomiting and Other (See Comments)  . Darvocet [Propoxyphene N-Acetaminophen] Rash  . Valproic Acid Rash    I personally reviewed active problem list, medication list, allergies, family history, social history, health maintenance with the patient/caregiver today.   ROS  Ten systems reviewed and is negative except as mentioned in HPI   Objective  Virtual encounter, vitals not obtained.  Body mass index is 32.99 kg/m.  Physical Exam  Awake, alert and oriented  PHQ2/9: Depression screen [facility] 2/9 10/20/2019 08/16/2019 07/21/2019 03/24/2019 03/01/2019  Decreased Interest 0 0 0 0 0  Down, Depressed, Hopeless 1 0 0 0 0  PHQ - 2 Score 1 0 0 0 0  Altered sleeping 1 0 0 0 0  Tired, decreased energy 0 0 0 0 0  Change in appetite 0 0 0 0 0  Feeling bad or failure about yourself  0 0 0 0 0  Trouble concentrating 0 0 0 0 0  Moving slowly or fidgety/restless 0 0 0 0 0  Suicidal thoughts 0 0 0 0 0  PHQ-9 Score 2 0 0 0 0  Difficult doing work/chores Somewhat difficult - - Not difficult at all Not difficult at all  Some recent data might be hidden   PHQ-2/9 Result is negative.    Fall Risk: Fall Risk  10/20/2019 08/16/2019 07/21/2019 03/24/2019 03/01/2019  Falls in the past year? 0 0 0 0 0  Number  falls in past yr: 0 0 0 0 -  Injury with Fall?  0 0 0 0 -     Assessment & Plan  1. Fibromyalgia  - pregabalin (LYRICA) 75 MG capsule; Take 1 capsule (75 mg total) by mouth 3 (three) times daily. Start once a day and go up slowly  Dispense: 90 capsule; Refill: 2 - methocarbamol (ROBAXIN) 500 MG tablet; Take 1 tablet (500 mg total) by mouth every 8 (eight) hours as needed for muscle spasms.  Dispense: 90 tablet; Refill: 2  2. Hypothyroidism, adult  - levothyroxine (SYNTHROID) 75 MCG tablet; Take 1 tablet (75 mcg total) by mouth daily.  Dispense: 30 tablet; Refill: 5  3. Bipolar affective, mixed ([place])  Continue follow up with psychiatrist   4. Asthma, mild intermittent, poorly controlled  Seeing pulmonologist now on Dulera  5. Chronic bilateral low back pain with sciatica, sciatica laterality unspecified  - Ambulatory referral to Pain Clinic  I discussed the assessment and treatment plan with the patient. The patient was provided an opportunity to ask questions and all were answered. The patient agreed with the plan and demonstrated an understanding of the instructions.  The patient was advised to call back or seek an in-person evaluation if the symptoms worsen or if the condition fails to improve as anticipated.  I provided 25  minutes of non-face-to-face time during this encounter.

## 2019-10-21 NOTE — Telephone Encounter (Signed)
Spoke to pt's friend, Jason(DPR) and requested that pt contact our office.  Corene Cornea stated that he would contact pt and ask her to do so.

## 2019-10-21 NOTE — Telephone Encounter (Signed)
Pt called back, I told patient time and date of covid test and the location. Pt voiced understanding.

## 2019-10-21 NOTE — Telephone Encounter (Signed)
ATC x2- unable to leave vm, due to mailbox being full.

## 2019-10-24 ENCOUNTER — Other Ambulatory Visit
Admission: RE | Admit: 2019-10-24 | Discharge: 2019-10-24 | Disposition: A | Discharge status: Home / Self Care | Payer: Medicaid Other | Source: Ambulatory Visit | Attending: Pulmonary Disease | Admitting: Pulmonary Disease

## 2019-10-24 ENCOUNTER — Other Ambulatory Visit: Payer: Self-pay

## 2019-10-24 DIAGNOSIS — Z20828 Contact with and (suspected) exposure to other viral communicable diseases: Secondary | ICD-10-CM | POA: Diagnosis not present

## 2019-10-24 DIAGNOSIS — Z01812 Encounter for preprocedural laboratory examination: Secondary | ICD-10-CM | POA: Diagnosis not present

## 2019-10-24 LAB — SARS CORONAVIRUS 2 (TAT 6-24 HRS): SARS Coronavirus 2: NEGATIVE

## 2019-10-25 ENCOUNTER — Ambulatory Visit: Payer: Medicaid Other | Attending: Pulmonary Disease

## 2019-10-25 ENCOUNTER — Other Ambulatory Visit: Payer: Self-pay

## 2019-10-25 DIAGNOSIS — R0602 Shortness of breath: Secondary | ICD-10-CM | POA: Diagnosis not present

## 2019-10-25 MED ORDER — ALBUTEROL SULFATE (2.5 MG/3ML) 0.083% IN NEBU
2.5000 mg | INHALATION_SOLUTION | Freq: Once | RESPIRATORY_TRACT | Status: AC
  Administered 2019-10-25: 2.5 mg via RESPIRATORY_TRACT
  Filled 2019-10-25: qty 3

## 2019-10-31 ENCOUNTER — Encounter: Payer: Self-pay | Admitting: Family Medicine

## 2019-11-01 ENCOUNTER — Telehealth: Payer: Self-pay | Admitting: Pulmonary Disease

## 2019-11-01 NOTE — Telephone Encounter (Signed)
Renee Pita, MD  Shon Hale, CMA  Test is consistent with mild asthma/reactive airways disease. Her overall lung function is very good.    Pt is aware of results and voiced her understanding. Nothing further is needed.

## 2019-11-02 ENCOUNTER — Encounter: Payer: Self-pay | Admitting: Family Medicine

## 2019-11-02 ENCOUNTER — Other Ambulatory Visit: Payer: Medicaid Other

## 2019-11-03 ENCOUNTER — Ambulatory Visit: Payer: Medicaid Other | Attending: Internal Medicine

## 2019-11-03 DIAGNOSIS — Z20822 Contact with and (suspected) exposure to covid-19: Secondary | ICD-10-CM

## 2019-11-07 ENCOUNTER — Encounter: Payer: Self-pay | Admitting: Family Medicine

## 2019-11-08 ENCOUNTER — Telehealth: Payer: Self-pay

## 2019-11-08 NOTE — Telephone Encounter (Signed)
Pt called to get results. REsults still pending. She tested at [facility] on 12/31-[facility] carrier did not pick up tests and she will have to be re-tested. Pt was upset and hung up the phone.   [place]

## 2019-11-09 LAB — NOVEL CORONAVIRUS, NAA

## 2019-11-16 ENCOUNTER — Ambulatory Visit: Payer: Medicaid Other | Admitting: Pulmonary Disease

## 2019-11-16 ENCOUNTER — Other Ambulatory Visit: Payer: Self-pay

## 2019-11-16 ENCOUNTER — Encounter: Payer: Self-pay | Admitting: Pulmonary Disease

## 2019-11-16 VITALS — BP 124/76 | HR 69 | Temp 97.7°F | Ht 68.0 in | Wt 221.8 lb

## 2019-11-16 DIAGNOSIS — J4540 Moderate persistent asthma, uncomplicated: Secondary | ICD-10-CM

## 2019-11-16 DIAGNOSIS — R0602 Shortness of breath: Secondary | ICD-10-CM

## 2019-11-16 DIAGNOSIS — J3089 Other allergic rhinitis: Secondary | ICD-10-CM

## 2019-11-16 NOTE — Patient Instructions (Addendum)
1.  Continue albuterol as needed  2.  Continue Dulera  3.  Consider switching Allegra to Zyrtec if your postnasal drip issues continue.  4.  We will see you in follow-up in 4 months time call sooner should any new difficulties arise

## 2019-11-16 NOTE — Progress Notes (Signed)
    Assessment & Plan:  1. Moderate persistent asthma without complication (Primary)  2. Perennial allergic rhinitis  3. Shortness of breath   Patient Instructions  1.  Continue albuterol  as needed  2.  Continue Dulera   3.  Consider switching Allegra  to Zyrtec  if your postnasal drip issues continue.  4.  We will see you in follow-up in 4 months time call sooner should any new difficulties arise  Please note: late entry documentation due to logistical difficulties during COVID-19 pandemic. This note is filed for information purposes only, and is not intended to be used for billing, nor does it represent the full scope/nature of the visit in question. Please see any associated scanned media linked to date of encounter for additional pertinent information.  Subjective:    HPI: Renee Benjamin is a 39 y.o. female presenting to the pulmonology clinic on 11/16/2019 with report of: Follow-up (PFT 10/25/19-- pt reports of occ prod cough with clear mucus mainly in the morning, sob with singing and occ wheezing.)     Outpatient Encounter Medications as of 11/16/2019  Medication Sig Note   amphetamine -dextroamphetamine  (ADDERALL) 20 MG tablet Take 20 mg by mouth 2 (two) times daily.    [DISCONTINUED] albuterol  (PROVENTIL ) (2.5 MG/3ML) 0.083% nebulizer solution Take 3 mLs (2.5 mg total) by nebulization every 6 (six) hours as needed for wheezing or shortness of breath.    [DISCONTINUED] ARIPiprazole  (ABILIFY ) 10 MG tablet Take 0.5 tablets (5 mg total) by mouth at bedtime. (Patient not taking: Reported on 07/03/2020)    [DISCONTINUED] fexofenadine  (ALLEGRA ) 180 MG tablet Take 180 mg by mouth daily. (Patient not taking: Reported on 07/03/2020)    [DISCONTINUED] hydrOXYzine  (ATARAX /VISTARIL ) 25 MG tablet Take 1-2 tablets (25-50 mg total) by mouth 4 (four) times daily as needed for anxiety. (Patient not taking: Reported on 07/03/2020)    [DISCONTINUED] levothyroxine  (SYNTHROID ) 75 MCG tablet Take  1 tablet (75 mcg total) by mouth daily.    [DISCONTINUED] methocarbamol  (ROBAXIN ) 500 MG tablet Take 1 tablet (500 mg total) by mouth every 8 (eight) hours as needed for muscle spasms. (Patient not taking: Reported on 07/03/2020)    [DISCONTINUED] mometasone -formoterol  (DULERA ) 200-5 MCG/ACT AERO Inhale 2 puffs into the lungs 2 (two) times daily.    [DISCONTINUED] montelukast  (SINGULAIR ) 10 MG tablet TAKE 1 TABLET BY MOUTH EVERY DAY    [DISCONTINUED] pregabalin  (LYRICA ) 75 MG capsule Take 1 capsule (75 mg total) by mouth 3 (three) times daily. Start once a day and go up slowly (Patient not taking: Reported on 07/03/2020) 07/03/2020: felt dizzy/groggy    [DISCONTINUED] PROAIR  HFA 108 (90 Base) MCG/ACT inhaler INHALE 2 PUFFS BY MOUTH EVERY 6 HOURS IFNEEDED FOR WHEEZING OR SHORTNESS OF BREATH. (Patient taking differently: Inhale 2 puffs into the lungs every 6 (six) hours as needed for wheezing or shortness of breath. )    No facility-administered encounter medications on file as of 11/16/2019.      Objective:   Vitals:   11/16/19 0900  BP: 124/76  Pulse: 69  Temp: 97.7 F (36.5 C)  Height: 5' 8 (1.727 m)  Weight: 221 lb 12.8 oz (100.6 kg)  SpO2: 98%  TempSrc: Temporal  BMI (Calculated): 33.73     Physical exam documentation is limited by delayed entry of information.

## 2019-12-02 ENCOUNTER — Encounter: Payer: Self-pay | Admitting: Family Medicine
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
## 2019-12-28 ENCOUNTER — Ambulatory Visit
Payer: Medicaid Other | Attending: Student in an Organized Health Care Education/Training Program | Admitting: Student in an Organized Health Care Education/Training Program

## 2019-12-28 ENCOUNTER — Other Ambulatory Visit: Payer: Self-pay

## 2019-12-28 ENCOUNTER — Encounter: Payer: Self-pay | Admitting: Student in an Organized Health Care Education/Training Program

## 2019-12-28 VITALS — BP 110/86 | HR 86 | Temp 98.4°F | Resp 18 | Ht 68.0 in | Wt 218.0 lb

## 2019-12-28 DIAGNOSIS — M545 Low back pain, unspecified: Secondary | ICD-10-CM

## 2019-12-28 DIAGNOSIS — F331 Major depressive disorder, recurrent, moderate: Secondary | ICD-10-CM | POA: Insufficient documentation

## 2019-12-28 DIAGNOSIS — F3160 Bipolar disorder, current episode mixed, unspecified: Secondary | ICD-10-CM | POA: Insufficient documentation

## 2019-12-28 DIAGNOSIS — G8929 Other chronic pain: Secondary | ICD-10-CM | POA: Insufficient documentation

## 2019-12-28 DIAGNOSIS — F1110 Opioid abuse, uncomplicated: Secondary | ICD-10-CM | POA: Insufficient documentation

## 2019-12-28 DIAGNOSIS — F1910 Other psychoactive substance abuse, uncomplicated: Secondary | ICD-10-CM | POA: Insufficient documentation

## 2019-12-28 DIAGNOSIS — F1410 Cocaine abuse, uncomplicated: Secondary | ICD-10-CM | POA: Diagnosis present

## 2019-12-28 DIAGNOSIS — F1310 Sedative, hypnotic or anxiolytic abuse, uncomplicated: Secondary | ICD-10-CM | POA: Diagnosis present

## 2019-12-28 DIAGNOSIS — M797 Fibromyalgia: Secondary | ICD-10-CM | POA: Insufficient documentation

## 2019-12-28 DIAGNOSIS — M546 Pain in thoracic spine: Secondary | ICD-10-CM | POA: Diagnosis present

## 2019-12-28 MED ORDER — METHYLPREDNISOLONE 4 MG PO TBPK: ORAL_TABLET | ORAL | 0 refills | Status: AC

## 2019-12-29 ENCOUNTER — Ambulatory Visit: Payer: Medicaid Other | Admitting: Student in an Organized Health Care Education/Training Program

## 2020-01-18 ENCOUNTER — Ambulatory Visit: Payer: Medicaid Other | Admitting: Family Medicine

## 2020-04-03 ENCOUNTER — Other Ambulatory Visit: Payer: Self-pay

## 2020-04-09 ENCOUNTER — Other Ambulatory Visit: Payer: Self-pay

## 2020-04-09 ENCOUNTER — Ambulatory Visit: Payer: Medicaid Other | Admitting: Family Medicine

## 2020-04-09 ENCOUNTER — Encounter: Payer: Self-pay | Admitting: Family Medicine

## 2020-04-09 VITALS — BP 100/76 | HR 104 | Temp 97.3°F | Resp 16 | Ht 67.0 in | Wt 209.0 lb

## 2020-04-09 DIAGNOSIS — Z23 Encounter for immunization: Secondary | ICD-10-CM

## 2020-04-09 DIAGNOSIS — E039 Hypothyroidism, unspecified: Secondary | ICD-10-CM

## 2020-04-09 DIAGNOSIS — Z111 Encounter for screening for respiratory tuberculosis: Secondary | ICD-10-CM

## 2020-04-09 DIAGNOSIS — Z1159 Encounter for screening for other viral diseases: Secondary | ICD-10-CM

## 2020-04-09 DIAGNOSIS — E559 Vitamin D deficiency, unspecified: Secondary | ICD-10-CM

## 2020-04-09 DIAGNOSIS — Z0000 Encounter for general adult medical examination without abnormal findings: Secondary | ICD-10-CM

## 2020-04-09 DIAGNOSIS — N6325 Unspecified lump in the left breast, overlapping quadrants: Secondary | ICD-10-CM

## 2020-04-09 DIAGNOSIS — Z13220 Encounter for screening for lipoid disorders: Secondary | ICD-10-CM

## 2020-04-09 DIAGNOSIS — Z130 Encounter for screening for diseases of the blood and blood-forming organs and certain disorders involving the immune mechanism: Secondary | ICD-10-CM

## 2020-04-09 DIAGNOSIS — Z79899 Other long term (current) drug therapy: Secondary | ICD-10-CM

## 2020-04-09 DIAGNOSIS — Z131 Encounter for screening for diabetes mellitus: Secondary | ICD-10-CM

## 2020-04-09 NOTE — Patient Instructions (Signed)
Preventive Care 21-39 Years Old, Female Preventive care refers to visits with your health care provider and lifestyle choices that can promote health and wellness. This includes:  A yearly physical exam. This may also be called an annual well check.  Regular dental visits and eye exams.  Immunizations.  Screening for certain conditions.  Healthy lifestyle choices, such as eating a healthy diet, getting regular exercise, not using drugs or products that contain nicotine and tobacco, and limiting alcohol use. What can I expect for my preventive care visit? Physical exam Your health care provider will check your:  Height and weight. This may be used to calculate body mass index (BMI), which tells if you are at a healthy weight.  Heart rate and blood pressure.  Skin for abnormal spots. Counseling Your health care provider may ask you questions about your:  Alcohol, tobacco, and drug use.  Emotional well-being.  Home and relationship well-being.  Sexual activity.  Eating habits.  Work and work environment.  Method of birth control.  Menstrual cycle.  Pregnancy history. What immunizations do I need?  Influenza (flu) vaccine  This is recommended every year. Tetanus, diphtheria, and pertussis (Tdap) vaccine  You may need a Td booster every 10 years. Varicella (chickenpox) vaccine  You may need this if you have not been vaccinated. Human papillomavirus (HPV) vaccine  If recommended by your health care provider, you may need three doses over 6 months. Measles, mumps, and rubella (MMR) vaccine  You may need at least one dose of MMR. You may also need a second dose. Meningococcal conjugate (MenACWY) vaccine  One dose is recommended if you are age 19-21 years and a first-year college student living in a residence hall, or if you have one of several medical conditions. You may also need additional booster doses. Pneumococcal conjugate (PCV13) vaccine  You may need  this if you have certain conditions and were not previously vaccinated. Pneumococcal polysaccharide (PPSV23) vaccine  You may need one or two doses if you smoke cigarettes or if you have certain conditions. Hepatitis A vaccine  You may need this if you have certain conditions or if you travel or work in places where you may be exposed to hepatitis A. Hepatitis B vaccine  You may need this if you have certain conditions or if you travel or work in places where you may be exposed to hepatitis B. Haemophilus influenzae type b (Hib) vaccine  You may need this if you have certain conditions. You may receive vaccines as individual doses or as more than one vaccine together in one shot (combination vaccines). Talk with your health care provider about the risks and benefits of combination vaccines. What tests do I need?  Blood tests  Lipid and cholesterol levels. These may be checked every 5 years starting at age 20.  Hepatitis C test.  Hepatitis B test. Screening  Diabetes screening. This is done by checking your blood sugar (glucose) after you have not eaten for a while (fasting).  Sexually transmitted disease (STD) testing.  BRCA-related cancer screening. This may be done if you have a family history of breast, ovarian, tubal, or peritoneal cancers.  Pelvic exam and Pap test. This may be done every 3 years starting at age 21. Starting at age 30, this may be done every 5 years if you have a Pap test in combination with an HPV test. Talk with your health care provider about your test results, treatment options, and if necessary, the need for more tests.   Follow these instructions at home: Eating and drinking   Eat a diet that includes fresh fruits and vegetables, whole grains, lean protein, and low-fat dairy.  Take vitamin and mineral supplements as recommended by your health care provider.  Do not drink alcohol if: ? Your health care provider tells you not to drink. ? You are  pregnant, may be pregnant, or are planning to become pregnant.  If you drink alcohol: ? Limit how much you have to 0-1 drink a day. ? Be aware of how much alcohol is in your drink. In the [place] one drink equals one 12 oz bottle of beer (355 mL), one 5 oz glass of wine (148 mL), or one 1 oz glass of hard liquor (44 mL). Lifestyle  Take daily care of your teeth and gums.  Stay active. Exercise for at least 30 minutes on 5 or more days each week.  Do not use any products that contain nicotine or tobacco, such as cigarettes, e-cigarettes, and chewing tobacco. If you need help quitting, ask your health care provider.  If you are sexually active, practice safe sex. Use a condom or other form of birth control (contraception) in order to prevent pregnancy and STIs (sexually transmitted infections). If you plan to become pregnant, see your health care provider for a preconception visit. What's next?  Visit your health care provider once a year for a well check visit.  Ask your health care provider how often you should have your eyes and teeth checked.  Stay up to date on all vaccines. This information is not intended to replace advice given to you by your health care provider. Make sure you discuss any questions you have with your health care provider. Document Revised: 07/01/2018 Document Reviewed: 07/01/2018 Elsevier Patient Education  2020 [employer].

## 2020-04-09 NOTE — Progress Notes (Signed)
Name: Renee Benjamin   MRN: [id number]    DOB: 03/23/1981   Date:04/09/2020       Progress Note  Subjective  Chief Complaint  Chief Complaint  Patient presents with  . 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     HPI  Patient presents for annual CPE.   Exercise: continue over 150  Minutes per week   USPSTF grade A and B recommendations    Office Visit from 04/09/2020 in [facility]  AUDIT-C Score  0     Depression: Phq 9 is  positive Depression screen PHQ 2/9 04/09/2020 12/28/2019 10/20/2019 08/16/2019 07/21/2019  Decreased Interest 0 0 0 0 0  Down, Depressed, Hopeless 1 0 1 0 0  PHQ - 2 Score 1 0 1 0 0  Altered sleeping 1 - 1 0 0  Tired, decreased energy 3 - 0 0 0  Change in appetite 0 - 0 0 0  Feeling bad or failure about yourself  0 - 0 0 0  Trouble concentrating 0 - 0 0 0  Moving slowly or fidgety/restless 0 - 0 0 0  Suicidal thoughts 0 - 0 0 0  PHQ-9 Score 5 - 2 0 0  Difficult doing work/chores Not difficult at all - Somewhat difficult - -  Some recent data might be hidden   Hypertension: BP Readings from Last 3 Encounters:  04/09/20 100/76  12/28/19 110/86  11/16/19 124/76   Obesity: Wt Readings from Last 3 Encounters:  04/09/20 209 lb (94.8 kg)  12/28/19 218 lb (98.9 kg)  11/16/19 221 lb 12.8 oz (100.6 kg)   BMI Readings from Last 3 Encounters:  04/09/20 32.73 kg/m  12/28/19 33.15 kg/m  11/16/19 33.72 kg/m     Hep C Screening: today  STD testing and prevention (HIV/chl/gon/syphilis): not interested  Intimate partner violence: negative screen  Sexual History (Partners/Practices/Protection from STI/Past hx STI/Pregnancy Plans): lives with partner, same partner for the past 10 years Pain during Intercourse: no pain  Menstrual History/LMP/Abnormal Bleeding: s/p hysterectomy  Incontinence Symptoms: no problems   Breast cancer:  - Last Mammogram: we will order diagnostic testing, tender lump  at 12 o'clock left breast - BRCA gene screening: N/A  Osteoporosis: Discussed high calcium and vitamin D supplementation, weight bearing exercises  Cervical cancer screening: not indicated, s/p hysterectomy   Skin cancer: Discussed monitoring for atypical lesions  Colorectal cancer: start at 45  Lung cancer:   Low Dose CT Chest recommended if Age 55-80 years, 30 pack-year currently smoking OR have quit w/in 15years. Patient does not qualify.     Advanced Care Planning: A voluntary discussion about advance care planning including the explanation and discussion of advance directives.  Discussed health care proxy and Living will, and the patient was able to identify a health care proxy as mother .  Patient does not have a living will at present time.   Lipids: Lab Results  Component Value Date   CHOL 199 04/10/2015   Lab Results  Component Value Date   HDL 52 04/10/2015   Lab Results  Component Value Date   LDLCALC 127 (H) 04/10/2015   Lab Results  Component Value Date   TRIG 101 04/10/2015   Lab Results  Component Value Date   CHOLHDL 3.8 04/10/2015   No results found for: LDLDIRECT  Glucose: Glucose  Date Value Ref Range Status  03/16/2014 88 65 - 99 mg/dL Final    05/12/2013 95 65 - 99 mg/dL Final  09/02/2012 81 65 - 99 mg/dL Final   Glucose, Bld  Date Value Ref Range Status  06/15/2019 111 (H) 70 - 99 mg/dL Final  03/08/2019 185 (H) 70 - 99 mg/dL Final  03/07/2019 96 70 - 99 mg/dL Final    Patient Active Problem List   Diagnosis Date Noted  . History of aspiration pneumonia 10/14/2019  . Bipolar affective, mixed ([place]) 03/24/2019  . Anxiety 03/08/2019  . Pancreatitis 08/20/2018  . Right upper quadrant abdominal pain   . Polysubstance abuse ([place]) 01/22/2018  . Moderate recurrent major depression ([place]) 01/21/2018  . Cocaine abuse ([place]) 01/21/2018  . Cannabis abuse 01/21/2018  . Benzodiazepine abuse ([place]) 01/21/2018  . Barbiturate abuse ([place]) 01/21/2018  .  Opiate abuse, episodic ([place]) 01/21/2018  . Allergic rhinitis 02/24/2017  . Vitamin B12 deficiency 02/24/2017  . Chronic right hip pain 04/10/2016  . Controlled substance agreement signed 02/01/2016  . Obesity 02/01/2016  . ADHD (attention deficit hyperactivity disorder) 01/09/2016  . Fibromyalgia 01/09/2016  . Chronic fatigue 12/04/2015  . Torticollis, acute 10/08/2015  . Headache 10/02/2015  . Thoracic back pain 10/02/2015  . Lumbar pain 10/02/2015  . Arthralgia 10/02/2015  . Chronic tonsillitis 08/20/2015  . Migraine headache with aura 04/10/2015  . Hypothyroidism 04/10/2015  . PTSD (post-traumatic stress disorder) 04/10/2015  . Status post laparoscopic hysterectomy 01/03/2015  . Chronic female pelvic pain 11/23/2014  . Plantar fasciitis of left foot   . Stress fracture   . Asthma 12/16/2012  . Bipolar affective disorder, depressed, moderate ([place]) 12/16/2012    Past Surgical History:  Procedure Laterality Date  . ABDOMINAL HYSTERECTOMY  March 2016   Due to uterus being stiched into C Section incision  . APPENDECTOMY    . APPENDECTOMY  2008  . CESAREAN SECTION    . CESAREAN SECTION  2014  . LAPAROSCOPIC BILATERAL SALPINGECTOMY Bilateral 01/02/2015   Procedure: LAPAROSCOPIC BILATERAL SALPINGECTOMY;  Surgeon: Osborne Oman, MD;  Location: [facility];  Service: Gynecology;  Laterality: Bilateral;  . LAPAROSCOPIC HYSTERECTOMY N/A 01/02/2015   Procedure: HYSTERECTOMY TOTAL LAPAROSCOPIC;  Surgeon: Osborne Oman, MD;  Location: [facility];  Service: Gynecology;  Laterality: N/A;  . SHOULDER SURGERY Right   . SHOULDER SURGERY  2001   right    Family History  Problem Relation Age of Onset  . Diabetes Father   . Osteoporosis Mother   . Cancer Maternal Grandmother        breast  . Osteoporosis Maternal Grandmother     Social History   Socioeconomic History  . Marital status: Single    Spouse name: Not on file  . Number of children: 1  . Years of education: Not on file  .  Highest education level: GED or equivalent  Occupational History  . Not on file  Tobacco Use  . Smoking status: Former Smoker    Packs/day: 0.25    Years: 4.00    Pack years: 1.00    Types: Cigarettes    Quit date: 04/17/2012    Years since quitting: 7.9  . Smokeless tobacco: Never Used  Substance and Sexual Activity  . Alcohol use: No    Alcohol/week: 0.0 standard drinks  . Drug use: No  . Sexual activity: Yes    Partners: Male    Birth control/protection: None  Other Topics Concern  . Not on file  Social History Narrative   ** Merged History Encounter **       Social  Determinants of Health   Financial Resource Strain: Medium Risk  . Difficulty of Paying Living Expenses: Somewhat hard  Food Insecurity: No Food Insecurity  . Worried About Running Out of Food in the Last Year: Never true  . Ran Out of Food in the Last Year: Never true  Transportation Needs: No Transportation Needs  . Lack of Transportation (Medical): No  . Lack of Transportation (Non-Medical): No  Physical Activity: Sufficiently Active  . Days of Exercise per Week: 3 days  . Minutes of Exercise per Session: 70 min  Stress: Stress Concern Present  . Feeling of Stress : Very much  Social Connections: Somewhat Isolated  . Frequency of Communication with Friends and Family: More than three times a week  . Frequency of Social Gatherings with Friends and Family: Once a week  . Attends Religious Services: Never  . Active Member of Clubs or Organizations: No  . Attends Club or Organization Meetings: Not on file  . Marital Status: Living with partner  Intimate Partner Violence: Not At Risk  . Fear of Current or Ex-Partner: No  . Emotionally Abused: No  . Physically Abused: No  . Sexually Abused: No     Current Outpatient Medications:  .  amphetamine-dextroamphetamine (ADDERALL) 20 MG tablet, Take 20 mg by mouth 2 (two) times daily., Disp: , Rfl:  .  ARIPiprazole (ABILIFY) 10 MG tablet, Take 0.5 tablets  (5 mg total) by mouth at bedtime., Disp: 30 tablet, Rfl: 0 .  fexofenadine (ALLEGRA) 180 MG tablet, Take 180 mg by mouth daily., Disp: , Rfl:  .  hydrOXYzine (ATARAX/VISTARIL) 25 MG tablet, Take 1-2 tablets (25-50 mg total) by mouth 4 (four) times daily as needed for anxiety., Disp: 30 tablet, Rfl: 0 .  levothyroxine (SYNTHROID) 75 MCG tablet, Take 1 tablet (75 mcg total) by mouth daily., Disp: 30 tablet, Rfl: 5 .  methocarbamol (ROBAXIN) 500 MG tablet, Take 1 tablet (500 mg total) by mouth every 8 (eight) hours as needed for muscle spasms., Disp: 90 tablet, Rfl: 2 .  mometasone-formoterol (DULERA) 200-5 MCG/ACT AERO, Inhale 2 puffs into the lungs 2 (two) times daily., Disp: 13 g, Rfl: 2 .  montelukast (SINGULAIR) 10 MG tablet, TAKE 1 TABLET BY MOUTH EVERY DAY, Disp: 30 tablet, Rfl: 11 .  pregabalin (LYRICA) 75 MG capsule, Take 1 capsule (75 mg total) by mouth 3 (three) times daily. Start once a day and go up slowly (Patient taking differently: Take 150 mg by mouth at bedtime. Start once a day and go up slowly), Disp: 90 capsule, Rfl: 2 .  PROAIR HFA 108 (90 Base) MCG/ACT inhaler, INHALE 2 PUFFS BY MOUTH EVERY 6 HOURS IFNEEDED FOR WHEEZING OR SHORTNESS OF BREATH. (Patient taking differently: Inhale 2 puffs into the lungs every 6 (six) hours as needed for wheezing or shortness of breath. ), Disp: 8.5 g, Rfl: 0 .  albuterol (PROVENTIL) (2.5 MG/3ML) 0.083% nebulizer solution, Take 3 mLs (2.5 mg total) by nebulization every 6 (six) hours as needed for wheezing or shortness of breath., Disp: 75 mL, Rfl: 5  Allergies  Allergen Reactions  . Depakote [Divalproex Sodium] Hives  . Morphine Hives  . Prednisone     Possibly mania  . Propoxyphene Other (See Comments)  . Tramadol Nausea Only  . Tramadol Nausea And Vomiting and Other (See Comments)  . Darvocet [Propoxyphene N-Acetaminophen] Rash  . Valproic Acid Rash     ROS  Constitutional: Negative for fever or weight change.  Respiratory: Negative  for cough and   shortness of breath.   Cardiovascular: Negative for chest pain or palpitations.  Gastrointestinal: Negative for abdominal pain, no bowel changes.  Musculoskeletal:Postiive for gait problem but no  joint swelling.  Skin: Negative for rash.  Neurological: Negative for dizziness or headache.  No other specific complaints in a complete review of systems (except as listed in HPI above).  Objective  Vitals:   04/09/20 1003  BP: 100/76  Pulse: (!) 104  Resp: 16  Temp: (!) 97.3 F (36.3 C)  TempSrc: Temporal  SpO2: 98%  Weight: 209 lb (94.8 kg)  Height: 5' 7" (1.702 m)    Body mass index is 32.73 kg/m.  Physical Exam  Constitutional: Patient appears well-developed and obese . No distress.  HENT: Head: Normocephalic and atraumatic. Ears: B TMs ok, no erythema or effusion; Nose: Not done Mouth/Throat: not done Eyes: Conjunctivae and EOM are normal. Pupils are equal, round, and reactive to light. No scleral icterus.  Neck: Normal range of motion. Neck supple. No JVD present. No thyromegaly present.  Cardiovascular: Normal rate, regular rhythm and normal heart sounds.  No murmur heard. No BLE edema. Pulmonary/Chest: Effort normal , scattered end inspiratory wheezing bilaterally . No respiratory distress. Abdominal: Soft. Bowel sounds are normal, no distension. There is no tenderness. no masses Breast: tender lump at 12 o'clock left breast, no nipple discharge or rashes FEMALE GENITALIA:  Not done RECTAL: not done Musculoskeletal: Normal range of motion, no joint effusions. No gross deformities Neurological: he is alert and oriented to person, place, and time. No cranial nerve deficit. Coordination, balance, strength, speech and gait are normal.  Skin: Skin is warm and dry. No rash noted. No erythema.  Psychiatric: Patient has a normal mood and affect. behavior is normal. Judgment and thought content normal.  Fall Risk: Fall Risk  04/09/2020 12/28/2019 10/20/2019 08/16/2019  07/21/2019  Falls in the past year? 0 0 0 0 0  Number falls in past yr: 0 - 0 0 0  Injury with Fall? 0 - 0 0 0     Functional Status Survey: Is the patient deaf or have difficulty hearing?: No Does the patient have difficulty seeing, even when wearing glasses/contacts?: No Does the patient have difficulty concentrating, remembering, or making decisions?: No Does the patient have difficulty walking or climbing stairs?: No Does the patient have difficulty dressing or bathing?: No Does the patient have difficulty doing errands alone such as visiting a doctor's office or shopping?: No   Assessment & Plan  1. Well adult exam  She asked me to fill out forms for work, she has been working in [place] for the past 6 years - she is current at [employer] are [employer] for the past year - better pay. She drives to [place]. She is currently seeing psychiatrist at [facility], getting genetic testing to find out what medications will work best for her, off SSRI and Abilify  at this time. 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 Going to the spine center - sent by [facility].   2. Need for hepatitis B vaccination  First dose today   3. Need for hepatitis C screening test  - Hepatitis C antibody  4. Hypothyroidism, unspecified type  - TSH  5. Lipid screening  - Lipid panel  6. Screening for diabetes mellitus  - Hemoglobin A1c  7. Screening for deficiency anemia  - CBC with  Differential/Platelet  8. Screening-pulmonary TB  quantiferon gold   9. Long-term use of high-risk medication  - CBC with Differential/Platelet - COMPLETE METABOLIC PANEL WITH GFR  10. Vitamin D deficiency  - VITAMIN D 25 Hydroxy (Vit-D Deficiency, Fractures)   11. Breast lump on left side at 12 o'clock position  - MM Digital Diagnostic Bilat; Future - US BREAST LTD UNI LEFT INC AXILLA; Future  -USPSTF grade  A and B recommendations reviewed with patient; age-appropriate recommendations, preventive care, screening tests, etc discussed and encouraged; healthy living encouraged; see AVS for patient education given to patient -Discussed importance of 150 minutes of physical activity weekly, eat two servings of fish weekly, eat one serving of tree nuts ( cashews, pistachios, pecans, almonds.Marland Kitchen) every other day, eat 6 servings of fruit/vegetables daily and drink plenty of water and avoid sweet beverages.

## 2020-04-10 LAB — CBC WITH DIFFERENTIAL/PLATELET
Absolute Monocytes: 688 cells/uL (ref 200–950)
Basophils Absolute: 74 cells/uL (ref 0–200)
Basophils Relative: 0.8 %
Eosinophils Absolute: 102 cells/uL (ref 15–500)
Eosinophils Relative: 1.1 %
HCT: 44.1 % (ref 35.0–45.0)
Hemoglobin: 15 g/dL (ref 11.7–15.5)
Lymphs Abs: 2725 cells/uL (ref 850–3900)
MCH: 31.7 pg (ref 27.0–33.0)
MCHC: 34 g/dL (ref 32.0–36.0)
MCV: 93.2 fL (ref 80.0–100.0)
MPV: 10.2 fL (ref 7.5–12.5)
Monocytes Relative: 7.4 %
Neutro Abs: 5710 cells/uL (ref 1500–7800)
Neutrophils Relative %: 61.4 %
Platelets: 294 10*3/uL (ref 140–400)
RBC: 4.73 10*6/uL (ref 3.80–5.10)
RDW: 12.6 % (ref 11.0–15.0)
Total Lymphocyte: 29.3 %
WBC: 9.3 10*3/uL (ref 3.8–10.8)

## 2020-04-10 LAB — LIPID PANEL
Cholesterol: 160 mg/dL (ref <200)
HDL: 36 mg/dL — ABNORMAL LOW (ref >=50)
LDL Cholesterol (Calc): 107 mg/dL (calc) — ABNORMAL HIGH
Non-HDL Cholesterol (Calc): 124 mg/dL (calc) (ref <130)
Total CHOL/HDL Ratio: 4.4 (calc) (ref <5.0)
Triglycerides: 82 mg/dL (ref <150)

## 2020-04-10 LAB — COMPLETE METABOLIC PANEL WITH GFR
AG Ratio: 1.8 (calc) (ref 1.0–2.5)
ALT: 6 U/L (ref 6–29)
AST: 12 U/L (ref 10–30)
Albumin: 4.4 g/dL (ref 3.6–5.1)
Alkaline phosphatase (APISO): 71 U/L (ref 31–125)
BUN: 8 mg/dL (ref 7–25)
CO2: 24 mmol/L (ref 20–32)
Calcium: 9.5 mg/dL (ref 8.6–10.2)
Chloride: 107 mmol/L (ref 98–110)
Creat: 0.82 mg/dL (ref 0.50–1.10)
GFR, Est African American: 104 mL/min/{1.73_m2} (ref >=60)
GFR, Est Non African American: 90 mL/min/{1.73_m2} (ref >=60)
Globulin: 2.4 g/dL (calc) (ref 1.9–3.7)
Glucose, Bld: 94 mg/dL (ref 65–99)
Potassium: 4.1 mmol/L (ref 3.5–5.3)
Sodium: 140 mmol/L (ref 135–146)
Total Bilirubin: 0.3 mg/dL (ref 0.2–1.2)
Total Protein: 6.8 g/dL (ref 6.1–8.1)

## 2020-04-10 LAB — HEPATITIS C ANTIBODY
Hepatitis C Ab: NONREACTIVE
SIGNAL TO CUT-OFF: 0 (ref <1.00)

## 2020-04-10 LAB — VITAMIN D 25 HYDROXY (VIT D DEFICIENCY, FRACTURES): Vit D, 25-Hydroxy: 27 ng/mL — ABNORMAL LOW (ref 30–100)

## 2020-04-10 LAB — HEMOGLOBIN A1C
Hgb A1c MFr Bld: 5.2 % of total Hgb (ref <5.7)
Mean Plasma Glucose: 103 (calc)
eAG (mmol/L): 5.7 (calc)

## 2020-04-10 LAB — TSH: TSH: 4.15 mIU/L

## 2020-04-11 LAB — QUANTIFERON-TB GOLD PLUS
Mitogen-NIL: >10 IU/mL
NIL: 0.04 IU/mL
QuantiFERON-TB Gold Plus: NEGATIVE
TB1-NIL: 0.01 IU/mL
TB2-NIL: <0 IU/mL

## 2020-04-20 ENCOUNTER — Encounter: Payer: Self-pay | Admitting: Family Medicine

## 2020-06-04 ENCOUNTER — Ambulatory Visit
Admission: RE | Admit: 2020-06-04 | Discharge: 2020-06-04 | Disposition: A | Discharge status: Home / Self Care | Payer: Medicaid Other | Source: Ambulatory Visit | Attending: Family Medicine | Admitting: Family Medicine

## 2020-06-04 ENCOUNTER — Other Ambulatory Visit: Payer: Self-pay

## 2020-06-04 DIAGNOSIS — N6325 Unspecified lump in the left breast, overlapping quadrants: Secondary | ICD-10-CM

## 2020-06-12 ENCOUNTER — Emergency Department
Admission: EM | Admit: 2020-06-12 | Discharge: 2020-06-12 | Disposition: A | Discharge status: Home / Self Care | Payer: Medicaid Other | Attending: Emergency Medicine | Admitting: Emergency Medicine

## 2020-06-12 ENCOUNTER — Emergency Department: Payer: Medicaid Other

## 2020-06-12 DIAGNOSIS — Z5321 Procedure and treatment not carried out due to patient leaving prior to being seen by health care provider: Secondary | ICD-10-CM | POA: Insufficient documentation

## 2020-06-12 DIAGNOSIS — S6991XA Unspecified injury of right wrist, hand and finger(s), initial encounter: Secondary | ICD-10-CM | POA: Diagnosis not present

## 2020-06-12 DIAGNOSIS — Y929 Unspecified place or not applicable: Secondary | ICD-10-CM | POA: Insufficient documentation

## 2020-06-12 DIAGNOSIS — Y999 Unspecified external cause status: Secondary | ICD-10-CM | POA: Diagnosis not present

## 2020-06-12 DIAGNOSIS — X58XXXA Exposure to other specified factors, initial encounter: Secondary | ICD-10-CM | POA: Diagnosis not present

## 2020-06-12 DIAGNOSIS — Y939 Activity, unspecified: Secondary | ICD-10-CM | POA: Insufficient documentation

## 2020-06-12 NOTE — ED Triage Notes (Signed)
Pt arrived via POV with c/o right hand pain, swelling noted, injury occurred about 30 minutes PTA.

## 2020-06-14 ENCOUNTER — Telehealth: Payer: Self-pay

## 2020-06-14 NOTE — Telephone Encounter (Signed)
Copied from [place] [id number]. Topic: General - Other >> Jun 14, 2020 10:44 AM Antonieta Iba C wrote: Reason for CRM: pt would like to have a referral to her Orthopedic at [facility]. Pt says that she broke her right hand, her pinkie and ring finger are in a splint. Pt says that she was referred to [facility] by the hospital but she doesn't want to go to a different Ortho Dr.      Abbott Pao would like assistance.

## 2020-06-18 ENCOUNTER — Other Ambulatory Visit: Payer: Self-pay | Admitting: Family Medicine

## 2020-06-18 DIAGNOSIS — S62637G Displaced fracture of distal phalanx of left little finger, subsequent encounter for fracture with delayed healing: Secondary | ICD-10-CM

## 2020-07-03 ENCOUNTER — Encounter: Payer: Self-pay | Admitting: Family Medicine

## 2020-07-03 ENCOUNTER — Ambulatory Visit: Payer: Medicaid Other | Admitting: Family Medicine

## 2020-07-03 ENCOUNTER — Other Ambulatory Visit: Payer: Self-pay

## 2020-07-03 VITALS — BP 100/70 | HR 81 | Temp 98.1°F | Resp 16 | Ht 67.0 in | Wt 207.7 lb

## 2020-07-03 DIAGNOSIS — F3160 Bipolar disorder, current episode mixed, unspecified: Secondary | ICD-10-CM | POA: Diagnosis not present

## 2020-07-03 DIAGNOSIS — J301 Allergic rhinitis due to pollen: Secondary | ICD-10-CM

## 2020-07-03 DIAGNOSIS — M5440 Lumbago with sciatica, unspecified side: Secondary | ICD-10-CM

## 2020-07-03 DIAGNOSIS — J45998 Other asthma: Secondary | ICD-10-CM

## 2020-07-03 DIAGNOSIS — Z23 Encounter for immunization: Secondary | ICD-10-CM

## 2020-07-03 DIAGNOSIS — M797 Fibromyalgia: Secondary | ICD-10-CM | POA: Diagnosis not present

## 2020-07-03 DIAGNOSIS — E039 Hypothyroidism, unspecified: Secondary | ICD-10-CM

## 2020-07-03 DIAGNOSIS — G8929 Other chronic pain: Secondary | ICD-10-CM

## 2020-07-03 LAB — TSH: TSH: 1.97 mIU/L

## 2020-07-03 MED ORDER — MONTELUKAST SODIUM 10 MG PO TABS: 10.0000 mg | ORAL_TABLET | Freq: Every day | ORAL | 5 refills | Status: DC

## 2020-07-03 MED ORDER — DULERA 200-5 MCG/ACT IN AERO: 2.0000 | INHALATION_SPRAY | Freq: Two times a day (BID) | RESPIRATORY_TRACT | 2 refills | Status: DC

## 2020-07-03 MED ORDER — ALBUTEROL SULFATE HFA 108 (90 BASE) MCG/ACT IN AERS: 2.0000 | INHALATION_SPRAY | Freq: Four times a day (QID) | RESPIRATORY_TRACT | 0 refills | Status: DC | PRN

## 2020-07-03 NOTE — Progress Notes (Signed)
Name: Renee Benjamin   MRN: [id number]    DOB: 11-Mar-1981   Date:07/03/2020       Progress Note  Subjective  Chief Complaint  Chief Complaint  Patient presents with  . Follow-up    HPI  FMS: Lyrica has helped but made her feel groggy so she stopped medication, she states she has been doing well lately, some days better than others, pain today is 2-3/10.   Bipolar mixed: she is under the care [facility] in [place] - she stopped going to [place]. She is now on Vraylar and off Abilify , she states feeling much better on the medication. She is also on propanolol prn and Ambien for sleep   Chronic pain: she was seen by neurosurgeon and also seen by pain clinic but decided not to go back since she has a history of drug abuse and was denied therapy. She did not want to get steroid injections. She is taking ibuprofen 800 mg twice daily since injured her right hand ( she got mad and was trying to get the keys out of her car)   Asthma: she was seen by pulmonologist but is doing better on [facility] and wants a refill, still wheezes a few times a week, advised to resume singulair daily to help. No cough or sob.   Hypothyroidism: she takes levothyroxine 75 mcg daily and denies hair loss, she is always tired ( unchanged) she denies constipation, last TSH was up and we will recheck it today   Dyslipidemia: discussed life style modification, eat more fish, tree nuts and increase physical activity    Patient Active Problem List   Diagnosis Date Noted  . History of aspiration pneumonia 10/14/2019  . Bipolar affective, mixed ([place]) 03/24/2019  . Anxiety 03/08/2019  . History of pancreatitis 08/20/2018  . Polysubstance abuse ([place]) 01/22/2018  . Moderate recurrent major depression ([place]) 01/21/2018  . Cocaine abuse ([place]) 01/21/2018  . Cannabis abuse 01/21/2018  . Benzodiazepine abuse ([place]) 01/21/2018  . Barbiturate abuse ([place]) 01/21/2018  . Opiate abuse, episodic ([place]) 01/21/2018  . Allergic  rhinitis 02/24/2017  . Vitamin B12 deficiency 02/24/2017  . Chronic right hip pain 04/10/2016  . Controlled substance agreement signed 02/01/2016  . Obesity 02/01/2016  . ADHD (attention deficit hyperactivity disorder) 01/09/2016  . Fibromyalgia 01/09/2016  . Chronic fatigue 12/04/2015  . Thoracic back pain 10/02/2015  . Lumbar pain 10/02/2015  . Chronic tonsillitis 08/20/2015  . Migraine headache with aura 04/10/2015  . Hypothyroidism 04/10/2015  . PTSD (post-traumatic stress disorder) 04/10/2015  . Status post laparoscopic hysterectomy 01/03/2015  . Chronic female pelvic pain 11/23/2014  . Plantar fasciitis of left foot   . Stress fracture   . Asthma 12/16/2012  . Bipolar affective disorder, depressed, moderate ([place]) 12/16/2012    Past Surgical History:  Procedure Laterality Date  . ABDOMINAL HYSTERECTOMY  March 2016   Due to uterus being stiched into C Section incision  . APPENDECTOMY    . APPENDECTOMY  2008  . CESAREAN SECTION    . CESAREAN SECTION  2014  . LAPAROSCOPIC BILATERAL SALPINGECTOMY Bilateral 01/02/2015   Procedure: LAPAROSCOPIC BILATERAL SALPINGECTOMY;  Surgeon: Osborne Oman, MD;  Location: [facility];  Service: Gynecology;  Laterality: Bilateral;  . LAPAROSCOPIC HYSTERECTOMY N/A 01/02/2015   Procedure: HYSTERECTOMY TOTAL LAPAROSCOPIC;  Surgeon: Osborne Oman, MD;  Location: [facility];  Service: Gynecology;  Laterality: N/A;  . SHOULDER SURGERY Right   . SHOULDER SURGERY  2001   right    Family  History  Problem Relation Age of Onset  . Diabetes Father   . Osteoporosis Mother   . Breast cancer Mother   . Diabetes Mother   . Cancer Maternal Grandmother        breast  . Osteoporosis Maternal Grandmother   . Breast cancer Maternal Grandmother     Social History   Tobacco Use  . Smoking status: Former Smoker    Packs/day: 0.25    Years: 4.00    Pack years: 1.00    Types: Cigarettes    Quit date: 04/17/2012    Years since quitting: 8.2  . Smokeless  tobacco: Never Used  Substance Use Topics  . Alcohol use: No    Alcohol/week: 0.0 standard drinks     Current Outpatient Medications:  .  amphetamine-dextroamphetamine (ADDERALL) 20 MG tablet, Take 20 mg by mouth 2 (two) times daily., Disp: , Rfl:  .  cariprazine (VRAYLAR) capsule, Take by mouth., Disp: , Rfl:  .  levothyroxine (SYNTHROID) 75 MCG tablet, Take 1 tablet (75 mcg total) by mouth daily., Disp: 30 tablet, Rfl: 5 .  propranolol (INDERAL) 20 MG tablet, Take by mouth., Disp: , Rfl:  .  zolpidem (AMBIEN) 10 MG tablet, Take by mouth., Disp: , Rfl:  .  albuterol (PROAIR HFA) 108 (90 Base) MCG/ACT inhaler, Inhale 2 puffs into the lungs every 6 (six) hours as needed for wheezing or shortness of breath., Disp: 18 g, Rfl: 0 .  fexofenadine (ALLEGRA) 180 MG tablet, Take 180 mg by mouth daily. (Patient not taking: Reported on 07/03/2020), Disp: , Rfl:  .  mometasone-formoterol (DULERA) 200-5 MCG/ACT AERO, Inhale 2 puffs into the lungs 2 (two) times daily., Disp: 13 g, Rfl: 2 .  montelukast (SINGULAIR) 10 MG tablet, Take 1 tablet (10 mg total) by mouth daily., Disp: 30 tablet, Rfl: 5  Allergies  Allergen Reactions  . Depakote [Divalproex Sodium] Hives  . Morphine Hives  . Prednisone     Possibly mania  . Propoxyphene Other (See Comments)  . Tramadol Nausea Only  . Tramadol Nausea And Vomiting and Other (See Comments)  . Darvocet [Propoxyphene N-Acetaminophen] Rash  . Valproic Acid Rash    I personally reviewed active problem list, medication list, allergies, family history, social history, health maintenance with the patient/caregiver today.   ROS  Constitutional: Negative for fever or weight change.  Respiratory: Negative for cough and shortness of breath.   Cardiovascular: Negative for chest pain or palpitations.  Gastrointestinal: Negative for abdominal pain, no bowel changes.  Musculoskeletal: Negative for gait problem or joint swelling.  Skin: Negative for rash.   Neurological: Negative for dizziness, positive for intermittent  headache.  No other specific complaints in a complete review of systems (except as listed in HPI above).  Objective  Vitals:   07/03/20 0747  BP: 100/70  Pulse: 81  Resp: 16  Temp: 98.1 F (36.7 C)  TempSrc: Oral  SpO2: 99%  Weight: 207 lb 11.2 oz (94.2 kg)  Height: 5\' 7"  (1.702 m)    Body mass index is 32.53 kg/m.  Physical Exam  Constitutional: Patient appears well-developed and well-nourished. Obese  No distress.  HEENT: head atraumatic, normocephalic, pupils equal and reactive to light, neck supple Cardiovascular: Normal rate, regular rhythm and normal heart sounds.  No murmur heard. No BLE edema. Pulmonary/Chest: Effort normal and breath sounds normal. No respiratory distress. Abdominal: Soft.  There is no tenderness. Muscular Skeletal: trigger point positive  Psychiatric: Patient has a normal mood and affect. behavior is  normal. Judgment and thought content normal.  Recent Results (from the past 2160 hour(s))  Lipid panel     Status: Abnormal   Collection Time: 04/09/20 11:00 AM  Result Value Ref Range   Cholesterol 160 <200 mg/dL   HDL 36 (L) > OR = 50 mg/dL   Triglycerides 82 <150 mg/dL   LDL Cholesterol (Calc) 107 (H) mg/dL (calc)    Comment: Reference range: <100 . Desirable range <100 mg/dL for primary prevention;   <70 mg/dL for patients with CHD or diabetic patients  with > or = 2 CHD risk factors. Marland Kitchen LDL-C is now calculated using the Martin-Hopkins  calculation, which is a validated novel method providing  better accuracy than the Friedewald equation in the  estimation of LDL-C.  Cresenciano Genre et al. Annamaria Helling. 4970;263(78): 2061-2068  (http://education.[facility].com/faq/FAQ164)    Total CHOL/HDL Ratio 4.4 <5.0 (calc)   Non-HDL Cholesterol (Calc) 124 <130 mg/dL (calc)    Comment: For patients with diabetes plus 1 major ASCVD risk  factor, treating to a non-HDL-C goal of <100 mg/dL   (LDL-C of <70 mg/dL) is considered a therapeutic  option.   CBC with Differential/Platelet     Status: None   Collection Time: 04/09/20 11:00 AM  Result Value Ref Range   WBC 9.3 3.8 - 10.8 Thousand/uL   RBC 4.73 3.80 - 5.10 Million/uL   Hemoglobin 15.0 11.7 - 15.5 g/dL   HCT 44.1 35 - 45 %   MCV 93.2 80.0 - 100.0 fL   MCH 31.7 27.0 - 33.0 pg   MCHC 34.0 32.0 - 36.0 g/dL   RDW 12.6 11.0 - 15.0 %   Platelets 294 140 - 400 Thousand/uL   MPV 10.2 7.5 - 12.5 fL   Neutro Abs 5,710 1,500 - 7,800 cells/uL   Lymphs Abs 2,725 850 - 3,900 cells/uL   Absolute Monocytes 688 200 - 950 cells/uL   Eosinophils Absolute 102 15 - 500 cells/uL   Basophils Absolute 74 0 - 200 cells/uL   Neutrophils Relative % 61.4 %   Total Lymphocyte 29.3 %   Monocytes Relative 7.4 %   Eosinophils Relative 1.1 %   Basophils Relative 0.8 %  COMPLETE METABOLIC PANEL WITH GFR     Status: None   Collection Time: 04/09/20 11:00 AM  Result Value Ref Range   Glucose, Bld 94 65 - 99 mg/dL    Comment: .            Fasting reference interval .    BUN 8 7 - 25 mg/dL   Creat 0.82 0.50 - 1.10 mg/dL   GFR, Est Non African American 90 > OR = 60 mL/min/1.21m2   GFR, Est African American 104 > OR = 60 mL/min/1.60m2   BUN/Creatinine Ratio NOT APPLICABLE 6 - 22 (calc)   Sodium 140 135 - 146 mmol/L   Potassium 4.1 3.5 - 5.3 mmol/L   Chloride 107 98 - 110 mmol/L   CO2 24 20 - 32 mmol/L   Calcium 9.5 8.6 - 10.2 mg/dL   Total Protein 6.8 6.1 - 8.1 g/dL   Albumin 4.4 3.6 - 5.1 g/dL   Globulin 2.4 1.9 - 3.7 g/dL (calc)   AG Ratio 1.8 1.0 - 2.5 (calc)   Total Bilirubin 0.3 0.2 - 1.2 mg/dL   Alkaline phosphatase (APISO) 71 31 - 125 U/L   AST 12 10 - 30 U/L   ALT 6 6 - 29 U/L  TSH     Status: None   Collection Time:  04/09/20 11:00 AM  Result Value Ref Range   TSH 4.15 mIU/L    Comment:           Reference Range .           > or = 20 Years  0.40-4.50 .                Pregnancy Ranges           First trimester     0.26-2.66           Second trimester   0.55-2.73           Third trimester    0.43-2.91   Hepatitis C antibody     Status: None   Collection Time: 04/09/20 11:00 AM  Result Value Ref Range   Hepatitis C Ab NON-REACTIVE NON-REACTI   SIGNAL TO CUT-OFF 0.00 <1.00    Comment: . HCV antibody was non-reactive. There is no laboratory  evidence of HCV infection. . In most cases, no further action is required. However, if recent HCV exposure is suspected, a test for HCV RNA (test code [phone]) is suggested. . For additional information please refer to http://education.[facility].com/faq/FAQ22v1 (This link is being provided for informational/ educational purposes only.) .   Hemoglobin A1c     Status: None   Collection Time: 04/09/20 11:00 AM  Result Value Ref Range   Hgb A1c MFr Bld 5.2 <5.7 % of total Hgb    Comment: For the purpose of screening for the presence of diabetes: . <5.7%       Consistent with the absence of diabetes 5.7-6.4%    Consistent with increased risk for diabetes             (prediabetes) > or =6.5%  Consistent with diabetes . This assay result is consistent with a decreased risk of diabetes. . Currently, no consensus exists regarding use of hemoglobin A1c for diagnosis of diabetes in children. . According to American Diabetes Association (ADA) guidelines, hemoglobin A1c <7.0% represents optimal control in non-pregnant diabetic patients. Different metrics may apply to specific patient populations.  Standards of Medical Care in Diabetes(ADA). .    Mean Plasma Glucose 103 (calc)   eAG (mmol/L) 5.7 (calc)  VITAMIN D 25 Hydroxy (Vit-D Deficiency, Fractures)     Status: Abnormal   Collection Time: 04/09/20 11:00 AM  Result Value Ref Range   Vit D, 25-Hydroxy 27 (L) 30 - 100 ng/mL    Comment: Vitamin D Status         25-OH Vitamin D: . Deficiency:                    <20 ng/mL Insufficiency:             20 - 29 ng/mL Optimal:                 > or = 30  ng/mL . For 25-OH Vitamin D testing on patients on  D2-supplementation and patients for whom quantitation  of D2 and D3 fractions is required, the QuestAssureD(TM) 25-OH VIT D, (D2,D3), LC/MS/MS is recommended: order  code [phone] (patients >57yrs). See Note 1 . Note 1 . For additional information, please refer to  http://education.[facility].com/faq/FAQ199  (This link is being provided for informational/ educational purposes only.)   QuantiFERON-TB Gold Plus     Status: None   Collection Time: 04/09/20 11:02 AM  Result Value Ref Range   QuantiFERON-TB Gold Plus NEGATIVE NEGATIVE    Comment: Negative test  result. M. tuberculosis complex  infection unlikely.    NIL 0.04 IU/mL   Mitogen-NIL >10.00 IU/mL   TB1-NIL 0.01 IU/mL   TB2-NIL <0.00 IU/mL    Comment: . The Nil tube value reflects the background interferon gamma immune response of the patient's blood sample. This value has been subtracted from the patient's displayed TB and Mitogen results. . Lower than expected results with the Mitogen tube prevent false-negative Quantiferon readings by detecting a patient with a potential immune suppressive condition and/or suboptimal pre-analytical specimen handling. . The TB1 Antigen tube is coated with the M. tuberculosis-specific antigens designed to elicit responses from TB antigen primed CD4+ helper T-lymphocytes. . The TB2 Antigen tube is coated with the M. tuberculosis-specific antigens designed to elicit responses from TB antigen primed CD4+ helper and CD8+ cytotoxic T-lymphocytes. . For additional information, please refer to https://education.[facility].com/faq/FAQ204 (This link is being provided for informational/ educational purposes only.) .       PHQ2/9: Depression screen [facility] 2/9 07/03/2020 04/09/2020 12/28/2019 10/20/2019 08/16/2019  Decreased Interest 1 0 0 0 0  Down, Depressed, Hopeless 0 1 0 1 0  PHQ - 2 Score 1 1 0 1 0  Altered sleeping 0  1 - 1 0  Tired, decreased energy 0 3 - 0 0  Change in appetite 1 0 - 0 0  Feeling bad or failure about yourself  0 0 - 0 0  Trouble concentrating 0 0 - 0 0  Moving slowly or fidgety/restless 1 0 - 0 0  Suicidal thoughts 0 0 - 0 0  PHQ-9 Score 3 5 - 2 0  Difficult doing work/chores - Not difficult at all - Somewhat difficult -  Some recent data might be hidden    phq 9 is positiive    Fall Risk: Fall Risk  07/03/2020 04/09/2020 12/28/2019 10/20/2019 08/16/2019  Falls in the past year? 1 0 0 0 0  Number falls in past yr: 0 0 - 0 0  Injury with Fall? 0 0 - 0 0      Functional Status Survey: Is the patient deaf or have difficulty hearing?: No Does the patient have difficulty seeing, even when wearing glasses/contacts?: No Does the patient have difficulty concentrating, remembering, or making decisions?: No Does the patient have difficulty walking or climbing stairs?: No Does the patient have difficulty dressing or bathing?: No Does the patient have difficulty doing errands alone such as visiting a doctor's office or shopping?: No    Assessment & Plan  1. Bipolar affective, mixed ([place])  Doing well on medication   2. Seasonal allergic rhinitis due to pollen  - montelukast (SINGULAIR) 10 MG tablet; Take 1 tablet (10 mg total) by mouth daily.  Dispense: 30 tablet; Refill: 5  3. Need for immunization against influenza  - Flu Vaccine QUAD 36+ mos IM  4. Fibromyalgia   5. Asthma, persistent controlled  - albuterol (PROAIR HFA) 108 (90 Base) MCG/ACT inhaler; Inhale 2 puffs into the lungs every 6 (six) hours as needed for wheezing or shortness of breath.  Dispense: 18 g; Refill: 0 - mometasone-formoterol (DULERA) 200-5 MCG/ACT AERO; Inhale 2 puffs into the lungs 2 (two) times daily.  Dispense: 13 g; Refill: 2  6. Adult hypothyroidism  - TSH  7. Chronic bilateral low back pain with sciatica, sciatica laterality unspecified   8. Need for pneumococcal vaccine  -  Pneumococcal polysaccharide vaccine 23-valent greater than or equal to 2yo subcutaneous/IM  9. Need for hepatitis B vaccination  - Hepatitis B  vaccine adult IM

## 2020-07-07 ENCOUNTER — Encounter: Payer: Self-pay | Admitting: Family Medicine

## 2020-08-23 ENCOUNTER — Telehealth (INDEPENDENT_AMBULATORY_CARE_PROVIDER_SITE_OTHER): Payer: Medicaid Other | Admitting: Internal Medicine

## 2020-08-23 ENCOUNTER — Encounter: Payer: Self-pay | Admitting: Internal Medicine

## 2020-08-23 DIAGNOSIS — B379 Candidiasis, unspecified: Secondary | ICD-10-CM

## 2020-08-23 DIAGNOSIS — J069 Acute upper respiratory infection, unspecified: Secondary | ICD-10-CM | POA: Diagnosis not present

## 2020-08-23 DIAGNOSIS — J4530 Mild persistent asthma, uncomplicated: Secondary | ICD-10-CM

## 2020-08-23 MED ORDER — FLUCONAZOLE 150 MG PO TABS: 150.0000 mg | ORAL_TABLET | Freq: Once | ORAL | 0 refills | Status: AC

## 2020-08-23 MED ORDER — AMOXICILLIN-POT CLAVULANATE 875-125 MG PO TABS: 1.0000 | ORAL_TABLET | Freq: Two times a day (BID) | ORAL | 0 refills | Status: AC

## 2020-08-23 NOTE — Progress Notes (Signed)
Name: Renee Benjamin   MRN: [id number]    DOB: Jul 16, 1981   Date:08/23/2020       Progress Note  Subjective  Chief Complaint  Chief Complaint  Patient presents with  . Cough  . Ear Pain    Hurts when she yawns  . Chest Congestion    I connected with  Fritzi Mandes on 08/23/20 at  8:00 AM EDT by telephone and verified that I am speaking with the correct person using two identifiers.  I discussed the limitations, risks, security and privacy concerns of performing an evaluation and management service by telephone and the availability of in person appointments. The patient expressed understanding and agreed to proceed. Staff also discussed with the patient that there may be a patient responsible charge related to this service. Patient Location: Home Provider Location: [facility] Additional Individuals present: none  HPI  Patient is a 39 year old female patient of Dr. Ancil Boozer Last visit with her was 07/03/2020 At that visit, her albuterol and Dulera were both refilled for her asthma after seeing pulmonology, and Singulair resumed for seasonal allergic rhinitis as well.  She was noted to have occasional wheezes at that point. Also noted at that visit was that she was doing well on medication for her bipolar affective disorder, and she is a former smoker. Follows up today with a phone visit with increasing URI symptoms, namely cough, chest congestion, and ear pain.  Covid vaccination- had both shots, awaiting Moderna booster Her symptoms started yesterday with increased sinus congestion, greenish drainage and now feels in her chest. + cough, + production of the discolored mucus Worried had pneumonia in the past, "comes fast for me" No marked SOB except with coughing fit + fever 100.5 yesterday,  No bad sore throat.  +mild congestion, feels more in chest now No loss of smell, loss of taste No N/V No muscle aches No marked loose stools/diarrhea No CP,  passing out  episodes  Works in child care Using albuterol inhaler 4X/day and still using Dulera inhaler, generic cough and cold medicine  Comorbid conditions reviewed + asthma   No h/o DM, heart disease,  + obesity Wt Readings from Last 3 Encounters:  07/03/20 207 lb 11.2 oz (94.2 kg)  06/12/20 210 lb (95.3 kg)  04/09/20 209 lb (94.8 kg)   She also notes she often gets yeast infections with antibiotics, and requested having Diflucan available should that happen.  Felt was reasonable.   Patient Active Problem List   Diagnosis Date Noted  . History of aspiration pneumonia 10/14/2019  . Bipolar affective, mixed ([place]) 03/24/2019  . Anxiety 03/08/2019  . History of pancreatitis 08/20/2018  . Polysubstance abuse ([place]) 01/22/2018  . Moderate recurrent major depression ([place]) 01/21/2018  . Cocaine abuse ([place]) 01/21/2018  . Cannabis abuse 01/21/2018  . Benzodiazepine abuse ([place]) 01/21/2018  . Barbiturate abuse ([place]) 01/21/2018  . Opiate abuse, episodic ([place]) 01/21/2018  . Allergic rhinitis 02/24/2017  . Vitamin B12 deficiency 02/24/2017  . Chronic right hip pain 04/10/2016  . Controlled substance agreement signed 02/01/2016  . Obesity 02/01/2016  . ADHD (attention deficit hyperactivity disorder) 01/09/2016  . Fibromyalgia 01/09/2016  . Chronic fatigue 12/04/2015  . Thoracic back pain 10/02/2015  . Lumbar pain 10/02/2015  . Chronic tonsillitis 08/20/2015  . Migraine headache with aura 04/10/2015  . Hypothyroidism 04/10/2015  . PTSD (post-traumatic stress disorder) 04/10/2015  . Status post laparoscopic hysterectomy 01/03/2015  . Chronic female pelvic pain 11/23/2014  . Plantar fasciitis of  left foot   . Stress fracture   . Asthma 12/16/2012  . Bipolar affective disorder, depressed, moderate ([place]) 12/16/2012    Past Surgical History:  Procedure Laterality Date  . ABDOMINAL HYSTERECTOMY  March 2016   Due to uterus being stiched into C Section incision  . APPENDECTOMY    . APPENDECTOMY   2008  . CESAREAN SECTION    . CESAREAN SECTION  2014  . LAPAROSCOPIC BILATERAL SALPINGECTOMY Bilateral 01/02/2015   Procedure: LAPAROSCOPIC BILATERAL SALPINGECTOMY;  Surgeon: Osborne Oman, MD;  Location: [facility];  Service: Gynecology;  Laterality: Bilateral;  . LAPAROSCOPIC HYSTERECTOMY N/A 01/02/2015   Procedure: HYSTERECTOMY TOTAL LAPAROSCOPIC;  Surgeon: Osborne Oman, MD;  Location: [facility];  Service: Gynecology;  Laterality: N/A;  . SHOULDER SURGERY Right   . SHOULDER SURGERY  2001   right    Family History  Problem Relation Age of Onset  . Diabetes Father   . Osteoporosis Mother   . Breast cancer Mother   . Diabetes Mother   . Cancer Maternal Grandmother        breast  . Osteoporosis Maternal Grandmother   . Breast cancer Maternal Grandmother     Social History   Tobacco Use  . Smoking status: Former Smoker    Packs/day: 0.25    Years: 4.00    Pack years: 1.00    Types: Cigarettes    Quit date: 04/17/2012    Years since quitting: 8.3  . Smokeless tobacco: Never Used  Substance Use Topics  . Alcohol use: No    Alcohol/week: 0.0 standard drinks     Current Outpatient Medications:  .  albuterol (PROAIR HFA) 108 (90 Base) MCG/ACT inhaler, Inhale 2 puffs into the lungs every 6 (six) hours as needed for wheezing or shortness of breath., Disp: 18 g, Rfl: 0 .  amphetamine-dextroamphetamine (ADDERALL) 20 MG tablet, Take 20 mg by mouth 2 (two) times daily., Disp: , Rfl:  .  cariprazine (VRAYLAR) capsule, Take by mouth., Disp: , Rfl:  .  levothyroxine (SYNTHROID) 75 MCG tablet, Take 1 tablet (75 mcg total) by mouth daily., Disp: 30 tablet, Rfl: 5 .  mometasone-formoterol (DULERA) 200-5 MCG/ACT AERO, Inhale 2 puffs into the lungs 2 (two) times daily., Disp: 13 g, Rfl: 2 .  montelukast (SINGULAIR) 10 MG tablet, Take 1 tablet (10 mg total) by mouth daily., Disp: 30 tablet, Rfl: 5 .  propranolol (INDERAL) 20 MG tablet, Take by mouth., Disp: , Rfl:  .  zolpidem (AMBIEN) 10 MG  tablet, Take by mouth., Disp: , Rfl:   Allergies  Allergen Reactions  . Depakote [Divalproex Sodium] Hives  . Morphine Hives  . Prednisone     Possibly mania  . Propoxyphene Other (See Comments)  . Tramadol Nausea Only  . Tramadol Nausea And Vomiting and Other (See Comments)  . Darvocet [Propoxyphene N-Acetaminophen] Rash  . Valproic Acid Rash    With staff assistance, above reviewed with the patient today.  ROS: As per HPI, otherwise no specific complaints on a limited and focused system review   Objective  Virtual encounter, vitals not obtained.  There is no height or weight on file to calculate BMI.  Physical Exam   Appears in NAD via conversation, has obvious nasal congestion with a nasal voice noted Breathing: No obvious respiratory distress. Speaking in complete sentences Neurological: Pt is alert, Speech is normal Psychiatric: Patient has a normal mood and affect, behavior is normal. Judgment and thought content normal.   No results found  for this or any previous visit (from the past 6 hour(s)).  PHQ2/9: Depression screen [facility] 2/9 08/23/2020 07/03/2020 04/09/2020 12/28/2019 10/20/2019  Decreased Interest 0 1 0 0 0  Down, Depressed, Hopeless 0 0 1 0 1  PHQ - 2 Score 0 1 1 0 1  Altered sleeping 0 0 1 - 1  Tired, decreased energy 0 0 3 - 0  Change in appetite 0 1 0 - 0  Feeling bad or failure about yourself  0 0 0 - 0  Trouble concentrating 0 0 0 - 0  Moving slowly or fidgety/restless 0 1 0 - 0  Suicidal thoughts 0 0 0 - 0  PHQ-9 Score 0 3 5 - 2  Difficult doing work/chores - - Not difficult at all - Somewhat difficult  Some recent data might be hidden   PHQ-2/9 Result reviewed  Fall Risk: Fall Risk  08/23/2020 07/03/2020 04/09/2020 12/28/2019 10/20/2019  Falls in the past year? 0 1 0 0 0  Number falls in past yr: 0 0 0 - 0  Injury with Fall? 0 0 0 - 0     Assessment & Plan  1. Upper respiratory tract infection, unspecified type 2. Mild persistent asthmatic  bronchitis without complication Did discuss that she likely has an upper respiratory infection, could be viral, although she has more concerns with how these frequently progress, and concerns with her now coughing up stuff and more chest congestion.  She does have an asthma history, on Dulera routinely, and has been using her albuterol inhaler more frequently in the last couple days. Noted in the setting of our Covid pandemic, that is in the differential, and did recommend she get tested for Covid, and she stated she would.  Getting a test at a [facility] or [facility] with the option she would likely pursue. We will add in Augmentin product-take twice daily Also recommended an expectorant addition and a Mucinex type product was recommended and she notes she has used them in the past with some success. Recommended Tylenol products for any low-grade temps Should continue using her albuterol inhaler more frequently, 3-4 times a day routinely to help with clearance, and also continue using the [facility] inhaler routinely  - amoxicillin-clavulanate (AUGMENTIN) 875-125 MG tablet; Take 1 tablet by mouth 2 (two) times daily for 10 days.  Dispense: 20 tablet; Refill: 0  3. Yeast infection concerns with abx use She asked to have a Diflucan product available as she often gets yeast infections with antibiotics, and felt was reasonable and prescribed.  Not to take prophylactically, but to take if she starts to develop symptoms while taking the antibiotics.  - fluconazole (DIFLUCAN) 150 MG tablet; Take 1 tablet (150 mg total) by mouth once for 1 dose.  Dispense: 1 tablet; Refill: 0  Await her response to the above, and not to return to work until symptoms much improved, and if her Covid test returns positive, she will let us know.  She should follow-up if symptoms not improving or more problematic over time.  I discussed the assessment and treatment plan with the patient. The patient was provided an opportunity to ask  questions and all were answered. The patient agreed with the plan and demonstrated an understanding of the instructions.  Red flags and when to present for emergency care or RTC including fevers, chest pain, shortness of breath, new/worsening/un-resolving symptoms reviewed with patient at time of visit.   The patient was advised to call back or seek an in-person evaluation if the  symptoms worsen or if the condition fails to improve as anticipated.  I provided 15 minutes of non-face-to-face time during this encounter that included discussing at length patient's sx/history, pertinent pmhx, medications, treatment and follow up plan. This time also included the necessary documentation, orders, and chart review.  Towanda Malkin, MD

## 2020-08-28 ENCOUNTER — Encounter: Payer: Self-pay | Admitting: Family Medicine

## 2020-08-30 ENCOUNTER — Emergency Department
Admission: EM | Admit: 2020-08-30 | Discharge: 2020-08-30 | Disposition: A | Discharge status: Home / Self Care | Payer: Medicaid Other | Attending: Emergency Medicine | Admitting: Emergency Medicine

## 2020-08-30 ENCOUNTER — Other Ambulatory Visit: Payer: Self-pay

## 2020-08-30 DIAGNOSIS — Z87891 Personal history of nicotine dependence: Secondary | ICD-10-CM | POA: Diagnosis not present

## 2020-08-30 DIAGNOSIS — R42 Dizziness and giddiness: Secondary | ICD-10-CM | POA: Insufficient documentation

## 2020-08-30 DIAGNOSIS — Z79899 Other long term (current) drug therapy: Secondary | ICD-10-CM | POA: Insufficient documentation

## 2020-08-30 DIAGNOSIS — J45909 Unspecified asthma, uncomplicated: Secondary | ICD-10-CM | POA: Diagnosis not present

## 2020-08-30 DIAGNOSIS — R55 Syncope and collapse: Secondary | ICD-10-CM | POA: Diagnosis not present

## 2020-08-30 DIAGNOSIS — E860 Dehydration: Secondary | ICD-10-CM | POA: Diagnosis not present

## 2020-08-30 DIAGNOSIS — E039 Hypothyroidism, unspecified: Secondary | ICD-10-CM | POA: Insufficient documentation

## 2020-08-30 LAB — BASIC METABOLIC PANEL
Anion gap: 9 (ref 5–15)
BUN: 7 mg/dL (ref 6–20)
CO2: 24 mmol/L (ref 22–32)
Calcium: 8.7 mg/dL — ABNORMAL LOW (ref 8.9–10.3)
Chloride: 106 mmol/L (ref 98–111)
Creatinine, Ser: 0.82 mg/dL (ref 0.44–1.00)
GFR, Estimated: >60 mL/min (ref >60)
Glucose, Bld: 117 mg/dL — ABNORMAL HIGH (ref 70–99)
Potassium: 4.3 mmol/L (ref 3.5–5.1)
Sodium: 139 mmol/L (ref 135–145)

## 2020-08-30 LAB — URINALYSIS, COMPLETE (UACMP) WITH MICROSCOPIC
Bilirubin Urine: NEGATIVE
Glucose, UA: NEGATIVE mg/dL
Hgb urine dipstick: NEGATIVE
Ketones, ur: NEGATIVE mg/dL
Leukocytes,Ua: NEGATIVE
Nitrite: NEGATIVE
Protein, ur: NEGATIVE mg/dL
Specific Gravity, Urine: 1.003 — ABNORMAL LOW (ref 1.005–1.030)
pH: 7 (ref 5.0–8.0)

## 2020-08-30 LAB — CBC
HCT: 39.1 % (ref 36.0–46.0)
Hemoglobin: 13.6 g/dL (ref 12.0–15.0)
MCH: 31.7 pg (ref 26.0–34.0)
MCHC: 34.8 g/dL (ref 30.0–36.0)
MCV: 91.1 fL (ref 80.0–100.0)
Platelets: 267 10*3/uL (ref 150–400)
RBC: 4.29 MIL/uL (ref 3.87–5.11)
RDW: 12.6 % (ref 11.5–15.5)
WBC: 8.3 10*3/uL (ref 4.0–10.5)
nRBC: 0 % (ref 0.0–0.2)

## 2020-08-30 MED ORDER — SODIUM CHLORIDE 0.9 % IV SOLN
1000.0000 mL | Freq: Once | INTRAVENOUS | Status: AC
  Administered 2020-08-30: 1000 mL via INTRAVENOUS

## 2020-08-30 NOTE — ED Notes (Signed)
Pt axox4 , pt ambulatory and understands all dc instructions, follow up pcp all info provided

## 2020-08-30 NOTE — ED Provider Notes (Signed)
[facility] Emergency Department Provider Note   ____________________________________________    I have reviewed the triage vital signs and the nursing notes.   HISTORY  Chief Complaint Dizziness     HPI Renee Benjamin is a 39 y.o. female with history as noted below who presents today with complaints of dizziness.  Patient reports that she felt lightheaded when she got up this morning but decided to go to work anyway.  When she was at work she became lightheaded and had to sit down decided to come in for evaluation.  She denies chest pain or palpitations.  No fevers or chills.  She reports she had a similar episode several months ago where she got lightheaded when she stood up and stretched.  No nausea or vomiting or diaphoresis.  No fevers chills or cough currently feels improved while lying down.  Past Medical History:  Diagnosis Date  . ADHD (attention deficit hyperactivity disorder) 01/09/2016  . Allergy   . Anemia   . Anxiety   . Anxiety    separation anxiety  . Asthma   . Chronic right hip pain 04/10/2016  . Collagen vascular disease ([place])   . Endometriosis   . Fibromyalgia 01/09/2016  . Headache    otc med prn  . Hypothyroidism   . Plantar fasciitis of left foot 08.29.14   PLANTAR HEEL ,FLUID AROUND MEDIAL BAND  . Polycystic ovarian disease   . Polysubstance abuse ([place]) 01/22/2018   ER visit March 2019: Drug screen is positive for marijuana, cocaine, benzodiazepines, barbiturates, and opiates  . Stress fracture 08.15.14   RIGHT   . Thoracic back pain   . Thyroid disease   . Vitamin D deficiency disease     Patient Active Problem List   Diagnosis Date Noted  . History of aspiration pneumonia 10/14/2019  . Bipolar affective, mixed ([place]) 03/24/2019  . Anxiety 03/08/2019  . History of pancreatitis 08/20/2018  . Polysubstance abuse ([place]) 01/22/2018  . Moderate recurrent major depression ([place]) 01/21/2018  . Cocaine abuse ([place])  01/21/2018  . Cannabis abuse 01/21/2018  . Benzodiazepine abuse ([place]) 01/21/2018  . Barbiturate abuse ([place]) 01/21/2018  . Opiate abuse, episodic ([place]) 01/21/2018  . Allergic rhinitis 02/24/2017  . Vitamin B12 deficiency 02/24/2017  . Chronic right hip pain 04/10/2016  . Controlled substance agreement signed 02/01/2016  . Obesity 02/01/2016  . ADHD (attention deficit hyperactivity disorder) 01/09/2016  . Fibromyalgia 01/09/2016  . Chronic fatigue 12/04/2015  . Thoracic back pain 10/02/2015  . Lumbar pain 10/02/2015  . Chronic tonsillitis 08/20/2015  . Migraine headache with aura 04/10/2015  . Hypothyroidism 04/10/2015  . PTSD (post-traumatic stress disorder) 04/10/2015  . Status post laparoscopic hysterectomy 01/03/2015  . Chronic female pelvic pain 11/23/2014  . Plantar fasciitis of left foot   . Stress fracture   . Asthma 12/16/2012  . Bipolar affective disorder, depressed, moderate ([place]) 12/16/2012    Past Surgical History:  Procedure Laterality Date  . ABDOMINAL HYSTERECTOMY  March 2016   Due to uterus being stiched into C Section incision  . APPENDECTOMY    . APPENDECTOMY  2008  . CESAREAN SECTION    . CESAREAN SECTION  2014  . LAPAROSCOPIC BILATERAL SALPINGECTOMY Bilateral 01/02/2015   Procedure: LAPAROSCOPIC BILATERAL SALPINGECTOMY;  Surgeon: Osborne Oman, MD;  Location: [facility];  Service: Gynecology;  Laterality: Bilateral;  . LAPAROSCOPIC HYSTERECTOMY N/A 01/02/2015   Procedure: HYSTERECTOMY TOTAL LAPAROSCOPIC;  Surgeon: Osborne Oman, MD;  Location: [facility];  Service: Gynecology;  Laterality: N/A;  . SHOULDER SURGERY Right   . SHOULDER SURGERY  2001   right    Prior to Admission medications   Medication Sig Start Date End Date Taking? Authorizing Provider  albuterol (PROAIR HFA) 108 (90 Base) MCG/ACT inhaler Inhale 2 puffs into the lungs every 6 (six) hours as needed for wheezing or shortness of breath. 07/03/20   Steele Sizer, MD  amoxicillin-clavulanate  (AUGMENTIN) 875-125 MG tablet Take 1 tablet by mouth 2 (two) times daily for 10 days. 08/23/20 09/02/20  Towanda Malkin, MD  amphetamine-dextroamphetamine (ADDERALL) 20 MG tablet Take 20 mg by mouth 2 (two) times daily. 01/26/19   [provider]  cariprazine (VRAYLAR) capsule Take by mouth.    [provider]  levothyroxine (SYNTHROID) 75 MCG tablet Take 1 tablet (75 mcg total) by mouth daily. 10/20/19   Steele Sizer, MD  mometasone-formoterol (DULERA) 200-5 MCG/ACT AERO Inhale 2 puffs into the lungs 2 (two) times daily. 07/03/20   Steele Sizer, MD  montelukast (SINGULAIR) 10 MG tablet Take 1 tablet (10 mg total) by mouth daily. 07/03/20   Steele Sizer, MD  propranolol (INDERAL) 20 MG tablet Take by mouth.    [provider]  zolpidem (AMBIEN) 10 MG tablet Take by mouth.    [provider]     Allergies Depakote [divalproex sodium], Morphine, Pneumococcal vaccines, Prednisone, Propoxyphene, Tramadol, Tramadol, Darvocet [propoxyphene n-acetaminophen], and Valproic acid  Family History  Problem Relation Age of Onset  . Diabetes Father   . Osteoporosis Mother   . Breast cancer Mother   . Diabetes Mother   . Cancer Maternal Grandmother        breast  . Osteoporosis Maternal Grandmother   . Breast cancer Maternal Grandmother     Social History Social History   Tobacco Use  . Smoking status: Former Smoker    Packs/day: 0.25    Years: 4.00    Pack years: 1.00    Types: Cigarettes    Quit date: 04/17/2012    Years since quitting: 8.3  . Smokeless tobacco: Never Used  Vaping Use  . Vaping Use: Never used  Substance Use Topics  . Alcohol use: No    Alcohol/week: 0.0 standard drinks  . Drug use: Not Currently    Types: Marijuana, Cocaine    Review of Systems  Constitutional: No fever/chills Eyes: No visual changes.  ENT: No sore throat. Cardiovascular: Denies chest pain. Respiratory: Denies shortness of  breath. Gastrointestinal: No abdominal pain.   Genitourinary: Negative for dysuria. Musculoskeletal: Negative for back pain. Skin: Negative for rash. Neurological: Negative for headaches   ____________________________________________   PHYSICAL EXAM:  VITAL SIGNS: ED Triage Vitals  Enc Vitals Group     BP 08/30/20 0758 108/86     Pulse Rate 08/30/20 0758 80     Resp 08/30/20 0758 16     Temp 08/30/20 0758 97.7 F (36.5 C)     Temp Source 08/30/20 0758 Oral     SpO2 08/30/20 0758 100 %     Weight 08/30/20 0759 93.4 kg (206 lb)     Height 08/30/20 0759 1.727 m (5\' 8" )     Head Circumference --      Peak Flow --      Pain Score 08/30/20 0756 0     Pain Loc --      Pain Edu? --      Excl. in [place]? --     Constitutional: Alert and oriented. No acute  distress. Pleasant and interactive  Nose: No congestion/rhinnorhea. Mouth/Throat: Mucous membranes are moist.    Cardiovascular: Normal rate, regular rhythm. Grossly normal heart sounds.  Good peripheral circulation. Respiratory: Normal respiratory effort.  No retractions. Lungs CTAB. Gastrointestinal: Soft and nontender. No distention.  No CVA tenderness.  Musculoskeletal: No lower extremity tenderness nor edema.  Warm and well perfused Neurologic:  Normal speech and language. No gross focal neurologic deficits are appreciated.  Skin:  Skin is warm, dry and intact. No rash noted. Psychiatric: Mood and affect are normal. Speech and behavior are normal.  ____________________________________________   LABS (all labs ordered are listed, but only abnormal results are displayed)  Labs Reviewed  BASIC METABOLIC PANEL - Abnormal; Notable for the following components:      Result Value   Glucose, Bld 117 (*)    Calcium 8.7 (*)    All other components within normal limits  URINALYSIS, COMPLETE (UACMP) WITH MICROSCOPIC - Abnormal; Notable for the following components:   Color, Urine STRAW (*)    APPearance HAZY (*)    Specific  Gravity, Urine 1.003 (*)    Bacteria, UA RARE (*)    All other components within normal limits  CBC   ____________________________________________  EKG  ED ECG REPORT I, Lavonia Drafts, the attending physician, personally viewed and interpreted this ECG.  Date: 08/30/2020  Rhythm: normal sinus rhythm QRS Axis: normal Intervals: normal ST/T Wave abnormalities: normal Narrative Interpretation: no evidence of acute ischemia  ____________________________________________  RADIOLOGY  None ____________________________________________   PROCEDURES  Procedure(s) performed: yes  .1-3 Lead EKG Interpretation Performed by: Lavonia Drafts, MD Authorized by: Lavonia Drafts, MD     Interpretation: normal     ECG rate assessment: normal     Rhythm: sinus rhythm     Ectopy: none     Conduction: normal       Critical Care performed: No ____________________________________________   INITIAL IMPRESSION / ASSESSMENT AND PLAN / ED COURSE  Pertinent labs & imaging results that were available during my care of the patient were reviewed by me and considered in my medical decision making (see chart for details).  Patient presents with complaints of dizziness, near syncope.  No palpitations to suggest arrhythmia, no chest pain.  She reports she attempts to stay well hydrated but certainly dehydration could be playing a role today.  EKG is quite reassuring.  No evidence of arrhythmia.  Patient monitored on cardiac monitor with no abnormalities as well  Lab work is unremarkable, electrolytes are normal.  Initial orthostatics are mildly abnormal, patient treated with IV fluids with normalization of orthostatics.  She is feeling better and is ambulating well without difficulty.  I counseled her that outpatient cardiology follow-up is needed for further evaluation of her recurrent episodes of dizziness.,  Return precautions discussed     ____________________________________________   FINAL CLINICAL IMPRESSION(S) / ED DIAGNOSES  Final diagnoses:  Orthostatic dizziness  Dehydration        Note:  This document was prepared using Dragon voice recognition software and may include unintentional dictation errors.   Lavonia Drafts, MD 08/30/20 1510

## 2020-08-30 NOTE — ED Notes (Signed)
Pt provided with warm blankets.

## 2020-08-30 NOTE — ED Triage Notes (Signed)
Reports dizziness on standing X 2 days. No syncope. Pt alert and oriented X4, cooperative, RR even and unlabored, color WNL. Pt in NAD.

## 2020-08-30 NOTE — Discharge Instructions (Addendum)
Please follow-up with cardiology as we discussed for further evaluation of your dizziness.  Continue to stay well-hydrated as this seems to help

## 2020-09-17 ENCOUNTER — Other Ambulatory Visit: Payer: Self-pay | Admitting: Family Medicine

## 2020-09-17 DIAGNOSIS — J45998 Other asthma: Secondary | ICD-10-CM

## 2020-10-31 DIAGNOSIS — S93401A Sprain of unspecified ligament of right ankle, initial encounter: Secondary | ICD-10-CM | POA: Insufficient documentation

## 2020-10-31 DIAGNOSIS — S92253A Displaced fracture of navicular [scaphoid] of unspecified foot, initial encounter for closed fracture: Secondary | ICD-10-CM | POA: Insufficient documentation

## 2020-11-21 ENCOUNTER — Encounter: Payer: Self-pay | Admitting: Family Medicine

## 2020-11-26 ENCOUNTER — Encounter: Payer: Self-pay | Admitting: Family Medicine

## 2020-11-28 NOTE — Progress Notes (Signed)
Name: Renee Benjamin   MRN: [id number]    DOB: 1981-06-05   Date:11/29/2020       Progress Note  Subjective  Chief Complaint  Possible Shingles   HPI  Rash: started last week, initially a few bumps and redness on right upper abdominal wall, not painful just itchy, but a few days later noticed bumps on right arm, some in clusters at times has blisters. It is not painful, just extremely itchy and she digs on her skin during the night. Boyfriend and son are asymptomatic. They live in a new house and she was worried because she had bed bugs in the past and some of the areas are in a cluster of 3. Lesions only on right arm and right upper quadrant. She has tried otc topical medication and triamcinolone, worse symptom is pruritis. She works in [employer] but not rash outbreaks going on   Recent foot fracture: she had a syncopal episode back in Dec, went to [place] . She fracture right foot and is wearing a boot. Beta blocker was stopped.    Patient Active Problem List   Diagnosis Date Noted  . History of aspiration pneumonia 10/14/2019  . Bipolar affective, mixed ([place]) 03/24/2019  . Anxiety 03/08/2019  . History of pancreatitis 08/20/2018  . Pancreatitis 08/20/2018  . Polysubstance abuse ([place]) 01/22/2018  . Moderate recurrent major depression ([place]) 01/21/2018  . Cocaine abuse ([place]) 01/21/2018  . Cannabis abuse 01/21/2018  . Benzodiazepine abuse ([place]) 01/21/2018  . Barbiturate abuse ([place]) 01/21/2018  . Opiate abuse, episodic ([place]) 01/21/2018  . Allergic rhinitis 02/24/2017  . Vitamin B12 deficiency 02/24/2017  . Chronic right hip pain 04/10/2016  . Controlled substance agreement signed 02/01/2016  . Obesity 02/01/2016  . ADHD (attention deficit hyperactivity disorder) 01/09/2016  . Fibromyalgia 01/09/2016  . Chronic fatigue 12/04/2015  . Thoracic back pain 10/02/2015  . Lumbar pain 10/02/2015  . Chronic tonsillitis 08/20/2015  . Migraine headache with aura 04/10/2015  . Hypothyroidism  04/10/2015  . PTSD (post-traumatic stress disorder) 04/10/2015  . Status post laparoscopic hysterectomy 01/03/2015  . Chronic female pelvic pain 11/23/2014  . Plantar fasciitis of left foot   . Stress fracture   . Asthma 12/16/2012  . Bipolar affective disorder, depressed, moderate ([place]) 12/16/2012  . Encounter for supervision of normal pregnancy in multigravida 12/14/2012    Past Surgical History:  Procedure Laterality Date  . ABDOMINAL HYSTERECTOMY  March 2016   Due to uterus being stiched into C Section incision  . APPENDECTOMY    . APPENDECTOMY  2008  . CESAREAN SECTION    . CESAREAN SECTION  2014  . LAPAROSCOPIC BILATERAL SALPINGECTOMY Bilateral 01/02/2015   Procedure: LAPAROSCOPIC BILATERAL SALPINGECTOMY;  Surgeon: Osborne Oman, MD;  Location: [facility];  Service: Gynecology;  Laterality: Bilateral;  . LAPAROSCOPIC HYSTERECTOMY N/A 01/02/2015   Procedure: HYSTERECTOMY TOTAL LAPAROSCOPIC;  Surgeon: Osborne Oman, MD;  Location: [facility];  Service: Gynecology;  Laterality: N/A;  . SHOULDER SURGERY Right   . SHOULDER SURGERY  2001   right    Family History  Problem Relation Age of Onset  . Diabetes Father   . Osteoporosis Mother   . Breast cancer Mother   . Diabetes Mother   . Cancer Maternal Grandmother        breast  . Osteoporosis Maternal Grandmother   . Breast cancer Maternal Grandmother     Social History   Tobacco Use  . Smoking status: Former Smoker    Packs/day: 0.25  Years: 4.00    Pack years: 1.00    Types: Cigarettes    Quit date: 04/17/2012    Years since quitting: 8.6  . Smokeless tobacco: Never Used  Substance Use Topics  . Alcohol use: No    Alcohol/week: 0.0 standard drinks     Current Outpatient Medications:  .  amphetamine-dextroamphetamine (ADDERALL) 20 MG tablet, Take 20 mg by mouth 2 (two) times daily., Disp: , Rfl:  .  cariprazine (VRAYLAR) capsule, Take by mouth., Disp: , Rfl:  .  clonazePAM (KLONOPIN) 0.5 MG tablet, Take 0.5 mg by  mouth 2 (two) times daily as needed for anxiety., Disp: , Rfl:  .  levothyroxine (SYNTHROID) 75 MCG tablet, Take 1 tablet (75 mcg total) by mouth daily., Disp: 30 tablet, Rfl: 5 .  mometasone-formoterol (DULERA) 200-5 MCG/ACT AERO, Inhale 2 puffs into the lungs 2 (two) times daily., Disp: 13 g, Rfl: 2 .  montelukast (SINGULAIR) 10 MG tablet, Take 1 tablet (10 mg total) by mouth daily., Disp: 30 tablet, Rfl: 5 .  PROAIR HFA 108 (90 Base) MCG/ACT inhaler, INHALE 2 PUFFS BY MOUTH EVERY 6 HOURS ASNEEDED WHEEZING/ SHORTNESS OF BREATH, Disp: 8.5 g, Rfl: 0 .  zolpidem (AMBIEN) 10 MG tablet, Take by mouth., Disp: , Rfl:  .  propranolol (INDERAL) 20 MG tablet, Take by mouth. (Patient not taking: Reported on 11/29/2020), Disp: , Rfl:   Allergies  Allergen Reactions  . Depakote [Divalproex Sodium] Hives  . Morphine Hives  . Pneumococcal Vaccines   . Prednisone     Possibly mania  . Propoxyphene Other (See Comments)  . Tramadol Nausea Only  . Tramadol Nausea And Vomiting and Other (See Comments)  . Darvocet [Propoxyphene N-Acetaminophen] Rash  . Valproic Acid Rash    I personally reviewed active problem list, medication list, allergies, family history, social history, health maintenance with the patient/caregiver today.   ROS  Constitutional: Negative for fever or weight change.  Respiratory: Negative for cough and shortness of breath.   Cardiovascular: Negative for chest pain or palpitations.  Gastrointestinal: Negative for abdominal pain, no bowel changes.  Musculoskeletal: positive for gait problem and right ankle  joint swelling.  Skin: positive  for rash.  Neurological: Negative for dizziness or headache.  No other specific complaints in a complete review of systems (except as listed in HPI above).  Objective  Vitals:   11/29/20 0904  BP: 134/86  Pulse: 92  Resp: 16  Temp: 97.6 F (36.4 C)  TempSrc: Oral  SpO2: 96%  Weight: 203 lb (92.1 kg)  Height: 5\' 8"  (1.727 m)    Body  mass index is 30.87 kg/m.  Physical Exam  Constitutional: Patient appears well-developed and well-nourished. Obese No distress.  HEENT: head atraumatic, normocephalic, pupils equal and reactive to light,  neck supple Cardiovascular: Normal rate, regular rhythm and normal heart sounds.  No murmur heard. No BLE edema. Pulmonary/Chest: Effort normal and breath sounds normal. No respiratory distress. Abdominal: Soft.  There is no tenderness. Skin; erythematous papules on right upper quadrant, no blisters or ulceration, on right arm it does not match any specific dermatome, some in clusters of 3, others by itself, one lesion was ulcerated , no blisters.  Psychiatric: Patient has a normal mood and affect. behavior is normal. Judgment and thought content normal.  PHQ2/9: Depression screen [facility] 2/9 11/29/2020 08/23/2020 07/03/2020 04/09/2020 12/28/2019  Decreased Interest 0 0 1 0 0  Down, Depressed, Hopeless 0 0 0 1 0  PHQ - 2 Score 0 0  1 1 0  Altered sleeping - 0 0 1 -  Tired, decreased energy - 0 0 3 -  Change in appetite - 0 1 0 -  Feeling bad or failure about yourself  - 0 0 0 -  Trouble concentrating - 0 0 0 -  Moving slowly or fidgety/restless - 0 1 0 -  Suicidal thoughts - 0 0 0 -  PHQ-9 Score - 0 3 5 -  Difficult doing work/chores - - - Not difficult at all -  Some recent data might be hidden    phq 9 is negative   Fall Risk: Fall Risk  11/29/2020 08/23/2020 07/03/2020 04/09/2020 12/28/2019  Falls in the past year? 0 0 1 0 0  Number falls in past yr: 0 0 0 0 -  Injury with Fall? 0 0 0 0 -     Functional Status Survey: Is the patient deaf or have difficulty hearing?: No Does the patient have difficulty seeing, even when wearing glasses/contacts?: No Does the patient have difficulty concentrating, remembering, or making decisions?: No Does the patient have difficulty walking or climbing stairs?: No Does the patient have difficulty dressing or bathing?: No Does the patient have  difficulty doing errands alone such as visiting a doctor's office or shopping?: No    Assessment & Plan  1. Rash  - hydrOXYzine (ATARAX/VISTARIL) 10 MG tablet; Take 1-2 tablets (10-20 mg total) by mouth 3 (three) times daily as needed.  Dispense: 30 tablet; Refill: 0 - predniSONE (DELTASONE) 10 MG tablet; Take 1 tablet (10 mg total) by mouth 2 (two) times daily with a meal.  Dispense: 10 tablet; Refill: 0  Explained not likely shingles since not following a dermatome, it may be bed bugs. We will control pruritis. Discussed prednisone side effects and history of intolerance in the past. She states she is able to tolerate lower doses   2. Pruritus  - hydrOXYzine (ATARAX/VISTARIL) 10 MG tablet; Take 1-2 tablets (10-20 mg total) by mouth 3 (three) times daily as needed.  Dispense: 30 tablet; Refill: 0 - predniSONE (DELTASONE) 10 MG tablet; Take 1 tablet (10 mg total) by mouth 2 (two) times daily with a meal.  Dispense: 10 tablet; Refill: 0

## 2020-11-29 ENCOUNTER — Other Ambulatory Visit: Payer: Self-pay

## 2020-11-29 ENCOUNTER — Ambulatory Visit: Payer: Medicaid Other | Admitting: Family Medicine

## 2020-11-29 ENCOUNTER — Encounter: Payer: Self-pay | Admitting: Family Medicine

## 2020-11-29 VITALS — BP 134/86 | HR 92 | Temp 97.6°F | Resp 16 | Ht 68.0 in | Wt 203.0 lb

## 2020-11-29 DIAGNOSIS — L299 Pruritus, unspecified: Secondary | ICD-10-CM

## 2020-11-29 DIAGNOSIS — R21 Rash and other nonspecific skin eruption: Secondary | ICD-10-CM | POA: Diagnosis not present

## 2020-11-29 DIAGNOSIS — Z87898 Personal history of other specified conditions: Secondary | ICD-10-CM | POA: Diagnosis not present

## 2020-11-29 MED ORDER — HYDROXYZINE HCL 10 MG PO TABS: 10.0000 mg | ORAL_TABLET | Freq: Three times a day (TID) | ORAL | 0 refills | Status: DC | PRN

## 2020-11-29 MED ORDER — PREDNISONE 10 MG PO TABS: 10.0000 mg | ORAL_TABLET | Freq: Two times a day (BID) | ORAL | 0 refills | Status: DC

## 2020-12-05 ENCOUNTER — Other Ambulatory Visit: Payer: Self-pay

## 2020-12-05 ENCOUNTER — Encounter: Payer: Self-pay | Admitting: Family Medicine

## 2020-12-05 ENCOUNTER — Ambulatory Visit (INDEPENDENT_AMBULATORY_CARE_PROVIDER_SITE_OTHER): Payer: Medicaid Other | Admitting: Family Medicine

## 2020-12-05 VITALS — BP 122/78 | HR 100 | Temp 98.0°F | Resp 18 | Ht 68.0 in | Wt 202.6 lb

## 2020-12-05 DIAGNOSIS — H5711 Ocular pain, right eye: Secondary | ICD-10-CM | POA: Diagnosis not present

## 2020-12-05 DIAGNOSIS — H109 Unspecified conjunctivitis: Secondary | ICD-10-CM

## 2020-12-05 DIAGNOSIS — H5461 Unqualified visual loss, right eye, normal vision left eye: Secondary | ICD-10-CM | POA: Diagnosis not present

## 2020-12-05 MED ORDER — CROMOLYN SODIUM 4 % OP SOLN: 1.0000 [drp] | Freq: Four times a day (QID) | OPHTHALMIC | 1 refills | Status: DC

## 2020-12-05 MED ORDER — FEXOFENADINE HCL 180 MG PO TABS: 180.0000 mg | ORAL_TABLET | Freq: Every day | ORAL | 2 refills | Status: DC

## 2020-12-05 MED ORDER — ERYTHROMYCIN 5 MG/GM OP OINT: 1.0000 "application " | TOPICAL_OINTMENT | Freq: Four times a day (QID) | OPHTHALMIC | 0 refills | Status: AC

## 2020-12-05 NOTE — Patient Instructions (Addendum)
After the first 1-2 days you can try adding lubricant eye drops and/or lumify to help soothe eyes  Do warm and cold compresses to both eyes to help with itching, swelling and to gently remove crusting and discharge  Avoid rubbing!  Send me a message tomorrow afternoon if you have ANY worsening pain, redness or vision and we will try to get you into the eye specialists for follow up.   Conjunctivitis can be viral, bacterial or allergic - yours is most suspicious for allergic and/or viral  I am also concerned that you may have scratched your eye - (see abrasion below)  Viral Conjunctivitis, Adult  Viral conjunctivitis is an inflammation of the clear membrane that covers the white part of the eye and the inner surface of the eyelid (conjunctiva). The inflammation is caused by a viral infection. The blood vessels in the conjunctiva become enlarged, causing the eye to become red or pink and often itchy. It usually starts in one eye and goes to the other in a day or two. Infections often resolve over 1-2 weeks. Viral conjunctivitis is contagious. This means it can be easily passed from one person to another. This condition is often called pink eye. What are the causes? This condition is caused by a virus. It can be spread by touching objects that have been contaminated with the virus, such as doorknobs or towels, and then touching your eye. It can also be passed through tiny droplets, such as from coughing or sneezing. What increases the risk? You are more likely to develop this condition if you have a cold or the flu, or are in close contact with a person with pink eye. What are the signs or symptoms? Symptoms of this condition include:  Redness in the eye.  Tearing or watery eyes.  Itchy and irritated eyes.  Burning feeling in the eyes.  Clear drainage from the eye.  Swollen eyelids.  A gritty feeling in the eye.  Light sensitivity. This condition often occurs with other symptoms,  such as nasal congestion, cough, and fever. How is this diagnosed? This condition is diagnosed with a medical history and physical exam. If you have discharge from your eye, the discharge may be tested to rule out other causes of conjunctivitis. How is this treated? Viral conjunctivitis does not respond to medicines that kill bacteria (antibiotics). The condition most often resolves on its own in 1-2 weeks. If treatment is needed, it is aimed at relieving your symptoms and preventing the spread of infection. This may be done with artificial tear drops, antihistamine drops, or other eye medicines. In rare cases, steroid eye drops or anti-herpes virus medicines may be prescribed. Follow these instructions at home: Medicines  Take or apply over-the-counter and prescription medicines only as told by your health care provider.  Do not touch the edge of the eyelid with the eye-drop bottle or ointment tube when applying medicines to the affected eye. This will prevent the spread of the infection to the other eye or to other people.   Eye care  Avoid touching or rubbing your eyes.  Apply a clean, cool, wet washcloth onto your eye for 10-20 minutes, 3-4 times per day, or as told by your health care provider.  If you wear contact lenses, do not wear them until the inflammation is gone and your health care provider says it is safe to wear them again. Ask your health care provider how to disinfect or replace your contact lenses before using them again.  Wear glasses until you can resume wearing contacts.  Avoid wearing eye makeup until the inflammation is gone. Throw away any old eye cosmetics that may be contaminated.  Gently wipe away any crusting from your eye with a wet washcloth or a cotton ball. General instructions  Change or wash your pillowcase every day or as told by your health care provider.  Do not share towels, pillowcases, washcloths, eye makeup, makeup brushes, contact lenses, or  eyeglasses. This may spread the infection.  Wash your hands often with soap and water. Use paper towels to dry your hands. If soap and water are not available, use hand sanitizer.  Avoid contact with other people until your eye is no longer red and tearing, or as told by your health care provider. Contact a health care provider if:  Your symptoms do not improve with treatment, or they get worse.  You have increased pain.  Your vision becomes blurry.  You have a fever.  You have facial pain, redness, or swelling.  You have yellow or green drainage coming from your eye.  You have new symptoms. Get help right away if:  You develop severe pain.  Your vision gets much worse. Summary  Viral conjunctivitis is an inflammation of the clear membrane that covers the white part of the eye and the inner surface of the eyelid. It usually goes away in 1-2 weeks.  This condition is usually treated with medicines and cold compresses. Treatment focuses on relieving the symptoms.  This condition is very contagious. To prevent infection, avoid close contact with others, wash your hands often, and do not share towels or washcloths.  Contact a health care provider if your symptoms do not go away with treatment, or if you have more pain, poor vision, or swelling in the eyes.  Get help right away if you have severe pain or your vision gets much worse. This information is not intended to replace advice given to you by your health care provider. Make sure you discuss any questions you have with your health care provider. Document Revised: 09/19/2019 Document Reviewed: 09/02/2019 Elsevier Patient Education  2021 [employer] Inc.   Corneal Abrasion  A corneal abrasion is a scratch or injury to the clear covering over the front of your eye (cornea). This can be painful. It is important to get treatment for a corneal abrasion. If this problem is not treated, it can affect your eyesight (vision). What are  the causes?  A poke in the eye.  An object in the eye.  Too much eye rubbing.  Very dry eyes.  Certain eye infections.  Contact lenses that do not fit right or are worn for too long. You can also injure your cornea when putting contact lenses in your eye or taking them out.  Eye surgery.  Certain cornea problems may increase the chance of a corneal abrasion. Sometimes, the cause is not known. What are the signs or symptoms?  Eye pain. The pain may get worse when you open and close your eye or when you move your eye.  A feeling of something stuck in your eye.  Tearing, redness, and sensitivity to light.  Having trouble keeping your eye open, or not being able to keep it open.  Blurred vision.  Headache. How is this diagnosed? You may work with a health care provider who specializes in conditions of the eye (ophthalmologist). This condition may be diagnosed based on your medical history, symptoms, and an eye exam. How is this  treated?  Washing out your eye.  Removing anything that is stuck in your eye.  Using antibiotic drops or ointment to treat or prevent an infection.  Using a dilating drop to decrease irritation, swelling, and pain.  Using steroid drops or ointment to treat redness, irritation, or swelling.  Applying a cold, wet cloth (cold compress) or ice pack to ease the pain  Taking pain medicine by mouth. In some cases, an eye patch or bandage soft contact lens might also be used. An eye patch should not be used if the corneal abrasion was related to contact lens wear as it can increase the chance of infection in these eyes. Follow these instructions at home: Medicines  Use eye drops or ointments as told by your doctor.  If you were prescribed antibiotic drops or ointment, use them as told by your doctor. Do not stop using the antibiotic even if you start to feel better.  Take over-the-counter and prescription medicines only as told by your  doctor.  Ask your doctor if the medicine prescribed to you: ? Requires you to avoid driving or using heavy machinery. ? Can cause trouble pooping (constipation). You may need to take these actions to prevent or treat trouble pooping:  Drink enough fluid to keep your pee (urine) pale yellow.  Take over-the-counter or prescription medicines.  Eat foods that are high in fiber. These include beans, whole grains, and fresh fruits and vegetables.  Limit foods that are high in fat and processed sugars. These include fried or sweet foods. Using an eye patch If you have an eye patch, wear it as told by your doctor.  Do not drive or use machinery while wearing an eye patch.  Follow instructions from your doctor about when to take off the patch. General instructions  Ask your doctor if you can use a cold, wet cloth on your eye to help with pain.  Do not rub or touch your eye. Do not wash out your eye.  Do not wear contact lenses until your doctor says that this is okay.  Avoid bright light.  Avoid straining your eyes.  Keep all follow-up visits as told by your doctor. Doing this can help to prevent infection and loss of eyesight. Contact a doctor if:  You keep having eye pain and other symptoms for more than 2 days.  You get new symptoms, such as more redness, watery eyes, or discharge.  You have discharge that makes your eyelids stick together in the morning.  Your eye patch becomes so loose that you can blink your eye.  Symptoms come back after your eye heals. Get help right away if:  You have very bad eye pain that does not get better with medicine.  You lose eyesight. Summary  A corneal abrasion is a scratch or injury to the clear covering over the front of the eye (cornea).  It is important to get treatment for a corneal abrasion. If this problem is not treated, it can affect your eyesight (vision).  Use eye drops or ointments as told by your doctor.  If you have an  eye patch, do not drive or use machinery while wearing it.  Let your doctor know if your symptoms last for more than 2 days. This information is not intended to replace advice given to you by your health care provider. Make sure you discuss any questions you have with your health care provider. Document Revised: 02/25/2019 Document Reviewed: 02/25/2019 Elsevier Patient Education  2021 Elsevier  Inc.  

## 2020-12-05 NOTE — Progress Notes (Signed)
Patient ID: Renee Benjamin, female    DOB: [birth year], 40 y.o.   MRN: [id number]  PCP: Steele Sizer, MD  Chief Complaint  Patient presents with  . Conjunctivitis    Woke up with green oozing and pain right eye worse then left     Subjective:   Renee Benjamin is a 40 y.o. female, presents to clinic with CC of the following:  Conjunctivitis  Episode onset: last night. The onset was sudden. The problem has been rapidly worsening. The problem is severe. Nothing relieves the symptoms. The symptoms are aggravated by movement and light. Associated symptoms include decreased vision, eye itching, photophobia, eye discharge, eye pain and eye redness. Pertinent negatives include no orthopnea, no fever, no double vision, no abdominal pain, no constipation, no diarrhea, no nausea, no vomiting, no congestion, no ear discharge, no ear pain, no headaches, no hearing loss, no mouth sores, no rhinorrhea, no sore throat, no stridor, no swollen glands, no muscle aches, no neck pain, no neck stiffness, no cough, no URI, no wheezing, no rash and no diaper rash. The eye pain is severe. Affected eye: discharge and itching in both eyes, pain and decreased vision to right eye. The eye pain is associated with movement. The eyelid exhibits redness and swelling. Sick contacts: works at day care, pink eye going around, contant exposure to viruses, new allergies/itching/rash with recent move to new house.    She could not open her eyes this morning, thick green crusting, since waking slow purulent discharge Swollen eyelids bilaterally   Patient Active Problem List   Diagnosis Date Noted  . History of syncope 11/29/2020  . Closed fracture of navicular bone of foot 10/31/2020  . Sprain of right ankle 10/31/2020  . History of aspiration pneumonia 10/14/2019  . Bipolar affective, mixed ([place]) 03/24/2019  . Anxiety 03/08/2019  . History of pancreatitis 08/20/2018  . Polysubstance abuse ([place]) 01/22/2018  .  Moderate recurrent major depression ([place]) 01/21/2018  . Cocaine abuse ([place]) 01/21/2018  . Cannabis abuse 01/21/2018  . Benzodiazepine abuse ([place]) 01/21/2018  . 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 Opiate abuse, episodic ([place]) 01/21/2018  . Allergic rhinitis 02/24/2017  . Vitamin B12 deficiency 02/24/2017  . Chronic right hip pain 04/10/2016  . Controlled substance agreement signed 02/01/2016  . Obesity 02/01/2016  . ADHD (attention deficit hyperactivity disorder) 01/09/2016  . Fibromyalgia 01/09/2016  . Chronic fatigue 12/04/2015  . Thoracic back pain 10/02/2015  . Lumbar pain 10/02/2015  . Chronic tonsillitis 08/20/2015  . Migraine headache with aura 04/10/2015  . Hypothyroidism 04/10/2015  . PTSD (post-traumatic stress disorder) 04/10/2015  . Status post laparoscopic hysterectomy 01/03/2015  . Chronic female pelvic pain 11/23/2014  . Plantar fasciitis of left foot   . Stress fracture   . Asthma 12/16/2012  . Bipolar affective disorder, depressed, moderate ([place]) 12/16/2012  . Encounter for supervision of normal pregnancy in multigravida 12/14/2012      Current Outpatient Medications:  .  amphetamine-dextroamphetamine (ADDERALL) 20 MG tablet, Take 20 mg by mouth 2 (two) times daily., Disp: , Rfl:  .  cariprazine (VRAYLAR) capsule, Take by mouth., Disp: , Rfl:  .  clonazePAM (KLONOPIN) 0.5 MG tablet, Take 0.5 mg by mouth 2 (two) times daily as needed for anxiety., Disp: , Rfl:  .  levothyroxine (SYNTHROID) 75 MCG tablet, Take 1 tablet (75 mcg total) by mouth daily., Disp: 30 tablet, Rfl: 5 .  mometasone-formoterol (DULERA) 200-5 MCG/ACT AERO, Inhale 2 puffs into the lungs 2 (two)  times daily., Disp: 13 g, Rfl: 2 .  montelukast (SINGULAIR) 10 MG tablet, Take 1 tablet (10 mg total) by mouth daily., Disp: 30 tablet, Rfl: 5 .  PROAIR HFA 108 (90 Base) MCG/ACT inhaler, INHALE 2 PUFFS BY MOUTH EVERY 6 HOURS ASNEEDED WHEEZING/ SHORTNESS OF BREATH, Disp: 8.5 g, Rfl: 0 .  zolpidem  (AMBIEN) 10 MG tablet, Take by mouth., Disp: , Rfl:  .  gabapentin (NEURONTIN) 400 MG capsule, gabapentin 400 mg capsule  Take 1 capsule 3 times a day by oral route. (Patient not taking: Reported on 12/05/2020), Disp: , Rfl:  .  hydrOXYzine (ATARAX/VISTARIL) 10 MG tablet, Take 1-2 tablets (10-20 mg total) by mouth 3 (three) times daily as needed. (Patient not taking: Reported on 12/05/2020), Disp: 30 tablet, Rfl: 0 .  predniSONE (DELTASONE) 10 MG tablet, Take 1 tablet (10 mg total) by mouth 2 (two) times daily with a meal. (Patient not taking: Reported on 12/05/2020), Disp: 10 tablet, Rfl: 0   Allergies  Allergen Reactions  . Depakote [Divalproex Sodium] Hives  . Morphine Hives  . Pneumococcal Vaccines   . Prednisone     Possibly mania  . Propoxyphene Other (See Comments)  . Tramadol Nausea Only  . Tramadol Nausea And Vomiting and Other (See Comments)  . Darvocet [Propoxyphene N-Acetaminophen] Rash  . Valproic Acid Rash     Social History   Tobacco Use  . Smoking status: Former Smoker    Packs/day: 0.25    Years: 4.00    Pack years: 1.00    Types: Cigarettes    Quit date: 04/17/2012    Years since quitting: 8.6  . Smokeless tobacco: Never Used  Vaping Use  . Vaping Use: Never used  Substance Use Topics  . Alcohol use: No    Alcohol/week: 0.0 standard drinks  . Drug use: Not Currently    Types: Marijuana, Cocaine      Chart Review Today: I personally reviewed active problem list, medication list, allergies, family history, social history, health maintenance, notes from last encounter, lab results, imaging with the patient/caregiver today.   Review of Systems  Constitutional: Negative for fever.  HENT: Negative for congestion, ear discharge, ear pain, hearing loss, mouth sores, rhinorrhea and sore throat.   Eyes: Positive for photophobia, pain, discharge, redness and itching. Negative for double vision.  Respiratory: Negative for cough, wheezing and stridor.    Cardiovascular: Negative for orthopnea.  Gastrointestinal: Negative for abdominal pain, constipation, diarrhea, nausea and vomiting.  Musculoskeletal: Negative for neck pain.  Skin: Negative for rash.  Neurological: Negative for headaches.  All other systems reviewed and are negative.      Objective:   Vitals:   12/05/20 0906  BP: 122/78  Pulse: 100  Resp: 18  Temp: 98 F (36.7 C)  TempSrc: Oral  SpO2: 99%  Weight: 202 lb 9.6 oz (91.9 kg)  Height: 5\' 8"  (1.727 m)    Body mass index is 30.81 kg/m.  Physical Exam Vitals and nursing note reviewed.  Constitutional:      General: She is not in acute distress.    Appearance: Normal appearance. She is obese. She is not ill-appearing, toxic-appearing or diaphoretic.  Eyes:     General: No allergic shiner or scleral icterus.    Extraocular Movements: Extraocular movements intact.     Conjunctiva/sclera:     Right eye: Right conjunctiva is injected.     Left eye: Left conjunctiva is injected.     Pupils: Pupils are equal, round, and reactive  to light.     Right eye: Pupil is round, reactive and not sluggish.     Left eye: Pupil is round, reactive and not sluggish.     Comments: Bilateral upper eyelids appear mildly swollen, thick green crusting diffusely to bilateral eyes and matted in eyelashes Mild bilateral conjunctival injection PERRLA See [facility] - decreased right eye vision Photophobia No ability to measure eye pressure or to do fluorescein eye exam in clinic  Neurological:     Mental Status: She is alert.    VA:  Right eye Left eye Both eyes  Without correction 20/25 20/15 20/15      Results for orders placed or performed during the hospital encounter of 08/30/20  Basic metabolic panel  Result Value Ref Range   Sodium 139 135 - 145 mmol/L   Potassium 4.3 3.5 - 5.1 mmol/L   Chloride 106 98 - 111 mmol/L   CO2 24 22 - 32 mmol/L   Glucose, Bld 117 (H) 70 - 99 mg/dL   BUN 7 6 - 20 mg/dL   Creatinine, Ser 6.04 0.44  - 1.00 mg/dL   Calcium 8.7 (L) 8.9 - 10.3 mg/dL   GFR, Estimated >54 >09 mL/min   Anion gap 9 5 - 15  CBC  Result Value Ref Range   WBC 8.3 4.0 - 10.5 K/uL   RBC 4.29 3.87 - 5.11 MIL/uL   Hemoglobin 13.6 12.0 - 15.0 g/dL   HCT 81.1 91.4 - 78.2 %   MCV 91.1 80.0 - 100.0 fL   MCH 31.7 26.0 - 34.0 pg   MCHC 34.8 30.0 - 36.0 g/dL   RDW 95.6 21.3 - 08.6 %   Platelets 267 150 - 400 K/uL   nRBC 0.0 0.0 - 0.2 %  Urinalysis, Complete w Microscopic  Result Value Ref Range   Color, Urine STRAW (A) YELLOW   APPearance HAZY (A) CLEAR   Specific Gravity, Urine 1.003 (L) 1.005 - 1.030   pH 7.0 5.0 - 8.0   Glucose, UA NEGATIVE NEGATIVE mg/dL   Hgb urine dipstick NEGATIVE NEGATIVE   Bilirubin Urine NEGATIVE NEGATIVE   Ketones, ur NEGATIVE NEGATIVE mg/dL   Protein, ur NEGATIVE NEGATIVE mg/dL   Nitrite NEGATIVE NEGATIVE   Leukocytes,Ua NEGATIVE NEGATIVE   RBC / HPF 0-5 0 - 5 RBC/hpf   WBC, UA 0-5 0 - 5 WBC/hpf   Bacteria, UA RARE (A) NONE SEEN   Squamous Epithelial / LPF 0-5 0 - 5       Assessment & Plan:     ICD-10-CM   1. Conjunctivitis of both eyes, unspecified conjunctivitis type  H10.9 cromolyn (OPTICROM) 4 % ophthalmic solution    fexofenadine (ALLEGRA) 180 MG tablet    erythromycin ophthalmic ointment    Ambulatory referral to Ophthalmology  2. Acute right eye pain  H57.11 erythromycin ophthalmic ointment    Ambulatory referral to Ophthalmology  3. Decreased vision of right eye  H54.61 erythromycin ophthalmic ointment    Ambulatory referral to Ophthalmology   Patient presents with acute onset of bilateral eye itching burning swelling and purulent discharge upon waking she had decreased vision in her right eye and associated eye pain and photophobia. 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 and her recent  move and increased allergies with pruritus and rash it is unclear if she has a viral conjunctivitis or allergic conjunctivitis that may have started in the last day  Encouraged her to do warm and cold compresses to both eyes to avoid rubbing, do erythromycin ointment to cover for any possible corneal abrasion or bacterial infection with how much profuse purulent and green discharge and crusting she has More likely that it is viral or allergic Encouraged her to start a daily antihistamine in addition to her Singulair home meds She has tried Pataday in the past she can try Pataday or I have sent in cromolyn for eye itching She can do hydrating drops but encouraged her to avoid any Visine type medications which will likely burn In a few days of her eye improved slightly she could try Lumify in addition to hydrating drops and cromolyn  We plan for very close follow-up if she has any worsening symptoms we want to get her urgently into her ophthalmologist for further evaluation   She was written out of work - note provided - we will extend this if not improving in the next few days  Delsa Grana, PA-C 12/05/20 9:13 AM

## 2020-12-07 ENCOUNTER — Other Ambulatory Visit: Payer: Self-pay | Admitting: Family Medicine

## 2020-12-07 DIAGNOSIS — J301 Allergic rhinitis due to pollen: Secondary | ICD-10-CM

## 2020-12-07 DIAGNOSIS — J4530 Mild persistent asthma, uncomplicated: Secondary | ICD-10-CM

## 2020-12-07 DIAGNOSIS — L309 Dermatitis, unspecified: Secondary | ICD-10-CM

## 2020-12-17 ENCOUNTER — Other Ambulatory Visit: Payer: Self-pay | Admitting: Family Medicine

## 2020-12-17 DIAGNOSIS — L309 Dermatitis, unspecified: Secondary | ICD-10-CM

## 2020-12-17 MED ORDER — TRIAMCINOLONE ACETONIDE 0.1 % EX OINT: TOPICAL_OINTMENT | Freq: Four times a day (QID) | CUTANEOUS | 0 refills | Status: DC

## 2020-12-21 ENCOUNTER — Other Ambulatory Visit: Payer: Self-pay | Admitting: Family Medicine

## 2020-12-21 DIAGNOSIS — L299 Pruritus, unspecified: Secondary | ICD-10-CM

## 2020-12-21 DIAGNOSIS — R21 Rash and other nonspecific skin eruption: Secondary | ICD-10-CM

## 2020-12-21 DIAGNOSIS — E039 Hypothyroidism, unspecified: Secondary | ICD-10-CM

## 2020-12-21 MED ORDER — PIMECROLIMUS 1 % EX CREA: TOPICAL_CREAM | Freq: Two times a day (BID) | CUTANEOUS | 0 refills | Status: DC

## 2020-12-27 ENCOUNTER — Other Ambulatory Visit: Payer: Self-pay | Admitting: Family Medicine

## 2020-12-27 LAB — CBC WITH DIFFERENTIAL/PLATELET
Absolute Monocytes: 923 cells/uL (ref 200–950)
Basophils Absolute: 46 cells/uL (ref 0–200)
Basophils Relative: 0.4 %
Eosinophils Absolute: 125 cells/uL (ref 15–500)
Eosinophils Relative: 1.1 %
HCT: 40.5 % (ref 35.0–45.0)
Hemoglobin: 14.2 g/dL (ref 11.7–15.5)
Lymphs Abs: 3260 cells/uL (ref 850–3900)
MCH: 31.9 pg (ref 27.0–33.0)
MCHC: 35.1 g/dL (ref 32.0–36.0)
MCV: 91 fL (ref 80.0–100.0)
MPV: 9.9 fL (ref 7.5–12.5)
Monocytes Relative: 8.1 %
Neutro Abs: 7045 cells/uL (ref 1500–7800)
Neutrophils Relative %: 61.8 %
Platelets: 252 10*3/uL (ref 140–400)
RBC: 4.45 10*6/uL (ref 3.80–5.10)
RDW: 12.6 % (ref 11.0–15.0)
Total Lymphocyte: 28.6 %
WBC: 11.4 10*3/uL — ABNORMAL HIGH (ref 3.8–10.8)

## 2020-12-27 LAB — ANA,IFA RA DIAG PNL W/RFLX TIT/PATN
Anti Nuclear Antibody (ANA): NEGATIVE
Cyclic Citrullin Peptide Ab: <16 UNITS
Rheumatoid fact SerPl-aCnc: <14 IU/mL (ref <14)

## 2020-12-27 LAB — COMPLETE METABOLIC PANEL WITH GFR
AG Ratio: 1.8 (calc) (ref 1.0–2.5)
ALT: 10 U/L (ref 6–29)
AST: 13 U/L (ref 10–30)
Albumin: 4.1 g/dL (ref 3.6–5.1)
Alkaline phosphatase (APISO): 71 U/L (ref 31–125)
BUN: 8 mg/dL (ref 7–25)
CO2: 30 mmol/L (ref 20–32)
Calcium: 9 mg/dL (ref 8.6–10.2)
Chloride: 105 mmol/L (ref 98–110)
Creat: 0.77 mg/dL (ref 0.50–1.10)
GFR, Est African American: 112 mL/min/{1.73_m2} (ref >=60)
GFR, Est Non African American: 97 mL/min/{1.73_m2} (ref >=60)
Globulin: 2.3 g/dL (calc) (ref 1.9–3.7)
Glucose, Bld: 92 mg/dL (ref 65–99)
Potassium: 3.7 mmol/L (ref 3.5–5.3)
Sodium: 142 mmol/L (ref 135–146)
Total Bilirubin: 0.2 mg/dL (ref 0.2–1.2)
Total Protein: 6.4 g/dL (ref 6.1–8.1)

## 2020-12-27 LAB — C-REACTIVE PROTEIN: CRP: 12.9 mg/L — ABNORMAL HIGH (ref <8.0)

## 2020-12-27 LAB — TSH: TSH: 4.18 mIU/L

## 2020-12-27 LAB — SEDIMENTATION RATE: Sed Rate: 9 mm/h (ref 0–20)

## 2020-12-28 ENCOUNTER — Other Ambulatory Visit: Payer: Self-pay | Admitting: Family Medicine

## 2020-12-28 DIAGNOSIS — L299 Pruritus, unspecified: Secondary | ICD-10-CM

## 2020-12-28 DIAGNOSIS — R21 Rash and other nonspecific skin eruption: Secondary | ICD-10-CM

## 2020-12-28 MED ORDER — HYDROXYZINE HCL 10 MG PO TABS: 10.0000 mg | ORAL_TABLET | Freq: Three times a day (TID) | ORAL | 0 refills | Status: DC | PRN

## 2021-01-01 NOTE — Progress Notes (Signed)
Name: Renee Benjamin   MRN: [id number]    DOB: 1981/06/03   Date:01/02/2021       Progress Note  Subjective  Chief Complaint  Follow up   HPI   FMS: long history of FMS, she states she has been stable around 4/10. Unable to tolerate gabapentin or Lyrica because it causes sedation. She stay active at work, works with 5 babies all day   Bipolar mixed: she is under the care [facility] in [place] - she stopped going to [place]. She is now on Vraylar and off Abilify , medication is working well, she has been feeling upset lately due to pruritis but otherwise feels well. Also takes Ambien for sleep, and now on clonopin in place of buspar to control anxiety   Chronic pain: she was seen by neurosurgeon and also seen by pain clinic but decided not to go back since she has a history of drug abuse and was denied therapy. She did not want to get steroid injections.  She states pain usually bothersome when she seats down on a hard floor , but doing better. Not taking medications at this time  Asthma: she was seen by pulmonologist in the past, she moved to a new place and has cockroaches. She states she has a nocturnal cough , some sob at night and also a rash that is not improving with topical medications or allegra. She has an allergist visit scheduled for March 10th and trying to exterminate cockroaches.   Hypothyroidism: she takes levothyroxine 75 mcg daily and denies hair loss, she is always tired ( unchanged) she denies constipation, last TSH was up , she asked to switch to Armour thyroid and we will do it today and recheck level in about 6-8 weeks   Dyslipidemia: discussed life style modification, eat more fish, tree nuts and increase physical activity. Recheck it yearly    Rash: going on for a month, reviewed labs, elevated white count level and CRP we will recheck levels, she states triamcinolone ointment is too greasy we will try the cream  Patient Active Problem List   Diagnosis Date  Noted  . History of syncope 11/29/2020  . Closed fracture of navicular bone of foot 10/31/2020  . Sprain of right ankle 10/31/2020  . History of aspiration pneumonia 10/14/2019  . Bipolar affective, mixed ([place]) 03/24/2019  . Anxiety 03/08/2019  . History of pancreatitis 08/20/2018  . Polysubstance abuse ([place]) 01/22/2018  . Moderate recurrent major depression ([place]) 01/21/2018  . Cocaine abuse ([place]) 01/21/2018  . Cannabis abuse 01/21/2018  . Benzodiazepine abuse ([place]) 01/21/2018  . Barbiturate abuse ([place]) 01/21/2018  . Opiate abuse, episodic ([place]) 01/21/2018  . Allergic rhinitis 02/24/2017  . Vitamin B12 deficiency 02/24/2017  . Chronic right hip pain 04/10/2016  . Controlled substance agreement signed 02/01/2016  . Obesity 02/01/2016  . ADHD (attention deficit hyperactivity disorder) 01/09/2016  . Fibromyalgia 01/09/2016  . Chronic fatigue 12/04/2015  . Thoracic back pain 10/02/2015  . Lumbar pain 10/02/2015  . Chronic tonsillitis 08/20/2015  . Migraine headache with aura 04/10/2015  . Hypothyroidism 04/10/2015  . PTSD (post-traumatic stress disorder) 04/10/2015  . Status post laparoscopic hysterectomy 01/03/2015  . Chronic female pelvic pain 11/23/2014  . Plantar fasciitis of left foot   . Stress fracture   . Asthma 12/16/2012  . Bipolar affective disorder, depressed, moderate ([place]) 12/16/2012  . Encounter for supervision of normal pregnancy in multigravida 12/14/2012    Past Surgical History:  Procedure Laterality Date  . ABDOMINAL  HYSTERECTOMY  March 2016   Due to uterus being stiched into C Section incision  . APPENDECTOMY    . APPENDECTOMY  2008  . CESAREAN SECTION    . CESAREAN SECTION  2014  . LAPAROSCOPIC BILATERAL SALPINGECTOMY Bilateral 01/02/2015   Procedure: LAPAROSCOPIC BILATERAL SALPINGECTOMY;  Surgeon: Osborne Oman, MD;  Location: [facility];  Service: Gynecology;  Laterality: Bilateral;  . LAPAROSCOPIC HYSTERECTOMY N/A 01/02/2015   Procedure: HYSTERECTOMY  TOTAL LAPAROSCOPIC;  Surgeon: Osborne Oman, MD;  Location: [facility];  Service: Gynecology;  Laterality: N/A;  . SHOULDER SURGERY Right   . SHOULDER SURGERY  2001   right    Family History  Problem Relation Age of Onset  . Diabetes Father   . Osteoporosis Mother   . Breast cancer Mother   . Diabetes Mother   . Cancer Maternal Grandmother        breast  . Osteoporosis Maternal Grandmother   . Breast cancer Maternal Grandmother     Social History   Tobacco Use  . Smoking status: Former Smoker    Packs/day: 0.25    Years: 4.00    Pack years: 1.00    Types: Cigarettes    Quit date: 04/17/2012    Years since quitting: 8.7  . Smokeless tobacco: Never Used  Substance Use Topics  . Alcohol use: No    Alcohol/week: 0.0 standard drinks     Current Outpatient Medications:  .  amphetamine-dextroamphetamine (ADDERALL) 20 MG tablet, Take 20 mg by mouth 2 (two) times daily., Disp: , Rfl:  .  cariprazine (VRAYLAR) capsule, Take 1 capsule by mouth daily., Disp: , Rfl:  .  cetirizine (ZYRTEC) 10 MG tablet, Take 1 tablet (10 mg total) by mouth at bedtime., Disp: 90 tablet, Rfl: 1 .  clonazePAM (KLONOPIN) 0.5 MG tablet, Take 0.5 mg by mouth 2 (two) times daily as needed for anxiety., Disp: , Rfl:  .  fexofenadine (ALLEGRA) 180 MG tablet, Take 1 tablet (180 mg total) by mouth daily., Disp: 30 tablet, Rfl: 2 .  hydrOXYzine (ATARAX/VISTARIL) 10 MG tablet, Take 1-2 tablets (10-20 mg total) by mouth 3 (three) times daily as needed., Disp: 90 tablet, Rfl: 0 .  thyroid (ARMOUR THYROID) 90 MG tablet, Take 1 tablet (90 mg total) by mouth daily. Take one M-F and one Sundays take only half pill, Disp: 30 tablet, Rfl: 1 .  triamcinolone 0.1% oint-Cerave equivalent lotion 1:1 mixture, Apply topically 4 (four) times daily., Disp: 454 g, Rfl: 0 .  zolpidem (AMBIEN) 10 MG tablet, Take 1 tablet by mouth daily., Disp: , Rfl:  .  mometasone-formoterol (DULERA) 200-5 MCG/ACT AERO, Inhale 2 puffs into the lungs  2 (two) times daily., Disp: 13 g, Rfl: 5 .  montelukast (SINGULAIR) 10 MG tablet, Take 1 tablet (10 mg total) by mouth daily., Disp: 30 tablet, Rfl: 5 .  pimecrolimus (ELIDEL) 1 % cream, Apply topically 2 (two) times daily., Disp: 100 g, Rfl: 0 .  PROAIR HFA 108 (90 Base) MCG/ACT inhaler, Inhale 2 puffs into the lungs every 6 (six) hours as needed for wheezing or shortness of breath., Disp: 8.5 g, Rfl: 0 .  triamcinolone (KENALOG) 0.1 %, Apply 1 application topically 2 (two) times daily., Disp: 453.6 g, Rfl: 0  Allergies  Allergen Reactions  . Depakote [Divalproex Sodium] Hives  . Morphine Hives  . Pneumococcal Vaccines   . Prednisone     Possibly mania  . Propoxyphene Other (See Comments)  . Tramadol Nausea Only  . Tramadol  Nausea And Vomiting and Other (See Comments)  . Darvocet [Propoxyphene N-Acetaminophen] Rash  . Valproic Acid Rash    I personally reviewed active problem list, medication list, allergies, family history, social history, health maintenance, notes from last encounter with the patient/caregiver today.   ROS  Constitutional: Negative for fever or weight change.  Respiratory: positive  for nocturnal cough and shortness of breath at night .   Cardiovascular: Negative for chest pain or palpitations.  Gastrointestinal: Negative for abdominal pain, no bowel changes.  Musculoskeletal: Negative for gait problem or joint swelling.  Skin: positive  for rash.  Neurological: Negative for dizziness or headache.  No other specific complaints in a complete review of systems (except as listed in HPI above).  Objective  Vitals:   01/02/21 0746  BP: 120/80  Pulse: (!) 110  Resp: 17  Temp: 98 F (36.7 C)  TempSrc: Oral  SpO2: 99%  Weight: 198 lb 9.6 oz (90.1 kg)  Height: 5\' 8"  (1.727 m)    Body mass index is 30.2 kg/m.  Physical Exam  Constitutional: Patient appears well-developed and well-nourished. Obese No distress.  HEENT: head atraumatic, normocephalic,  pupils equal and reactive to light,  neck supple Cardiovascular: Normal rate, regular rhythm and normal heart sounds.  No murmur heard. No BLE edema. Pulmonary/Chest: Effort normal and breath sounds normal. No respiratory distress. Abdominal: Soft.  There is no tenderness. Skin; erythematous papules on her back , arms and legs with excoriation, also has rash near ear lobe  Psychiatric: Patient has a normal mood and affect. behavior is normal. Judgment and thought content normal.  Recent Results (from the past 2160 hour(s))  ANA,IFA RA Diag Pnl w/rflx Tit/Patn     Status: None   Collection Time: 12/25/20  8:07 AM  Result Value Ref Range   Anti Nuclear Antibody (ANA) NEGATIVE NEGATIVE    Comment: ANA IFA is a first line screen for detecting the presence of up to approximately 150 autoantibodies in various autoimmune diseases. A negative ANA IFA result suggests an ANA-associated autoimmune disease is not present at this time, but is not definitive. If there is high clinical suspicion for Sjogren's syndrome, testing for anti-SS-A/Ro antibody should be considered. Anti-Jo-1 antibody should be considered for clinically suspected inflammatory myopathies. . AC-0: Negative . International Consensus on ANA Patterns (https://www.hernandez-brewer.com/) . For additional information, please refer to http://education.[employer].com/faq/FAQ177 (This link is being provided for informational/ educational purposes only.) .    Rhuematoid fact SerPl-aCnc <22 <97 IU/mL   Cyclic Citrullin Peptide Ab <16 UNITS    Comment: Reference Range Negative:            <20 Weak Positive:       20-39 Moderate Positive:   40-59 Strong Positive:     >59 .    INTERPRETATION      Comment: . There is no serologic evidence for rheumatoid arthritis. The RF and CCP tests each have a sensitivity for established rheumatoid arthritis of approximately 65-70%. .   Sedimentation rate     Status: None    Collection Time: 12/25/20  8:07 AM  Result Value Ref Range   Sed Rate 9 0 - 20 mm/h  C-reactive protein     Status: Abnormal   Collection Time: 12/25/20  8:07 AM  Result Value Ref Range   CRP 12.9 (H) <8.0 mg/L  TSH     Status: None   Collection Time: 12/25/20  8:07 AM  Result Value Ref Range   TSH 4.18 mIU/L  Comment:           Reference Range .           > or = 20 Years  0.40-4.50 .                Pregnancy Ranges           First trimester    0.26-2.66           Second trimester   0.55-2.73           Third trimester    0.43-2.91   COMPLETE METABOLIC PANEL WITH GFR     Status: None   Collection Time: 12/25/20  8:07 AM  Result Value Ref Range   Glucose, Bld 92 65 - 99 mg/dL    Comment: .            Fasting reference interval .    BUN 8 7 - 25 mg/dL   Creat 0.77 0.50 - 1.10 mg/dL   GFR, Est Non African American 97 > OR = 60 mL/min/1.32m2   GFR, Est African American 112 > OR = 60 mL/min/1.58m2   BUN/Creatinine Ratio NOT APPLICABLE 6 - 22 (calc)   Sodium 142 135 - 146 mmol/L   Potassium 3.7 3.5 - 5.3 mmol/L   Chloride 105 98 - 110 mmol/L   CO2 30 20 - 32 mmol/L   Calcium 9.0 8.6 - 10.2 mg/dL   Total Protein 6.4 6.1 - 8.1 g/dL   Albumin 4.1 3.6 - 5.1 g/dL   Globulin 2.3 1.9 - 3.7 g/dL (calc)   AG Ratio 1.8 1.0 - 2.5 (calc)   Total Bilirubin 0.2 0.2 - 1.2 mg/dL   Alkaline phosphatase (APISO) 71 31 - 125 U/L   AST 13 10 - 30 U/L   ALT 10 6 - 29 U/L  CBC with Differential/Platelet     Status: Abnormal   Collection Time: 12/25/20  8:07 AM  Result Value Ref Range   WBC 11.4 (H) 3.8 - 10.8 Thousand/uL   RBC 4.45 3.80 - 5.10 Million/uL   Hemoglobin 14.2 11.7 - 15.5 g/dL   HCT 40.5 35.0 - 45.0 %   MCV 91.0 80.0 - 100.0 fL   MCH 31.9 27.0 - 33.0 pg   MCHC 35.1 32.0 - 36.0 g/dL   RDW 12.6 11.0 - 15.0 %   Platelets 252 140 - 400 Thousand/uL   MPV 9.9 7.5 - 12.5 fL   Neutro Abs 7,045 1,500 - 7,800 cells/uL   Lymphs Abs 3,260 850 - 3,900 cells/uL   Absolute Monocytes 923  200 - 950 cells/uL   Eosinophils Absolute 125 15 - 500 cells/uL   Basophils Absolute 46 0 - 200 cells/uL   Neutrophils Relative % 61.8 %   Total Lymphocyte 28.6 %   Monocytes Relative 8.1 %   Eosinophils Relative 1.1 %   Basophils Relative 0.4 %     PHQ2/9: Depression screen [facility] 2/9 01/02/2021 12/05/2020 11/29/2020 08/23/2020 07/03/2020  Decreased Interest 1 0 0 0 1  Down, Depressed, Hopeless 1 0 0 0 0  PHQ - 2 Score 2 0 0 0 1  Altered sleeping 0 - - 0 0  Tired, decreased energy 0 - - 0 0  Change in appetite 0 - - 0 1  Feeling bad or failure about yourself  0 - - 0 0  Trouble concentrating 0 - - 0 0  Moving slowly or fidgety/restless 0 - - 0 1  Suicidal thoughts 0 - - 0 0  PHQ-9 Score 2 - - 0 3  Difficult doing work/chores - - - - -  Some recent data might be hidden    phq 9 is positive   Fall Risk: Fall Risk  01/02/2021 12/05/2020 11/29/2020 08/23/2020 07/03/2020  Falls in the past year? 1 1 0 0 1  Number falls in past yr: 0 0 0 0 0  Injury with Fall? 1 1 0 0 0  Risk for fall due to : - History of fall(s) - - -  Follow up - Falls evaluation completed - - -    Functional Status Survey: Is the patient deaf or have difficulty hearing?: No Does the patient have difficulty seeing, even when wearing glasses/contacts?: No Does the patient have difficulty concentrating, remembering, or making decisions?: No Does the patient have difficulty walking or climbing stairs?: No Does the patient have difficulty dressing or bathing?: No Does the patient have difficulty doing errands alone such as visiting a doctor's office or shopping?: No    Assessment & Plan  1. Leukocytosis, unspecified type  - CBC with Differential/Platelet  2. Pruritus  She has a follow up allergist on March 10 th, 2022  3. Rash  Using topical medication   4. Bipolar affective, mixed ([place])  Doing better on Vraylar   5. Adult hypothyroidism  - thyroid (ARMOUR THYROID) 90 MG tablet; Take 1 tablet (90 mg  total) by mouth daily. Take one M-F and one Sundays take only half pill  Dispense: 30 tablet; Refill: 1  6. Chronic bilateral low back pain with sciatica, sciatica laterality unspecified   7. Vitamin D deficiency  Taking supplementation   8. Asthma, persistent controlled  - mometasone-formoterol (DULERA) 200-5 MCG/ACT AERO; Inhale 2 puffs into the lungs 2 (two) times daily.  Dispense: 13 g; Refill: 5 - PROAIR HFA 108 (90 Base) MCG/ACT inhaler; Inhale 2 puffs into the lungs every 6 (six) hours as needed for wheezing or shortness of breath.  Dispense: 8.5 g; Refill: 0  9. Allergy to cockroaches  - cetirizine (ZYRTEC) 10 MG tablet; Take 1 tablet (10 mg total) by mouth at bedtime.  Dispense: 90 tablet; Refill: 1  10. Elevated C-reactive protein (CRP)  - C-reactive protein  11. Seasonal allergic rhinitis due to pollen  - montelukast (SINGULAIR) 10 MG tablet; Take 1 tablet (10 mg total) by mouth daily.  Dispense: 30 tablet; Refill: 5  12. Facial rash  - cetirizine (ZYRTEC) 10 MG tablet; Take 1 tablet (10 mg total) by mouth at bedtime.  Dispense: 90 tablet; Refill: 1 - pimecrolimus (ELIDEL) 1 % cream; Apply topically 2 (two) times daily.  Dispense: 100 g; Refill: 0

## 2021-01-02 ENCOUNTER — Other Ambulatory Visit: Payer: Self-pay

## 2021-01-02 ENCOUNTER — Encounter: Payer: Self-pay | Admitting: Family Medicine

## 2021-01-02 ENCOUNTER — Ambulatory Visit (INDEPENDENT_AMBULATORY_CARE_PROVIDER_SITE_OTHER): Payer: Medicaid Other | Admitting: Family Medicine

## 2021-01-02 VITALS — BP 120/80 | HR 110 | Temp 98.0°F | Resp 17 | Ht 68.0 in | Wt 198.6 lb

## 2021-01-02 DIAGNOSIS — R21 Rash and other nonspecific skin eruption: Secondary | ICD-10-CM

## 2021-01-02 DIAGNOSIS — E039 Hypothyroidism, unspecified: Secondary | ICD-10-CM

## 2021-01-02 DIAGNOSIS — L299 Pruritus, unspecified: Secondary | ICD-10-CM | POA: Diagnosis not present

## 2021-01-02 DIAGNOSIS — Z91038 Other insect allergy status: Secondary | ICD-10-CM

## 2021-01-02 DIAGNOSIS — D72829 Elevated white blood cell count, unspecified: Secondary | ICD-10-CM | POA: Diagnosis not present

## 2021-01-02 DIAGNOSIS — F3160 Bipolar disorder, current episode mixed, unspecified: Secondary | ICD-10-CM

## 2021-01-02 DIAGNOSIS — R7982 Elevated C-reactive protein (CRP): Secondary | ICD-10-CM

## 2021-01-02 DIAGNOSIS — M5440 Lumbago with sciatica, unspecified side: Secondary | ICD-10-CM

## 2021-01-02 DIAGNOSIS — G8929 Other chronic pain: Secondary | ICD-10-CM

## 2021-01-02 DIAGNOSIS — J301 Allergic rhinitis due to pollen: Secondary | ICD-10-CM

## 2021-01-02 DIAGNOSIS — E559 Vitamin D deficiency, unspecified: Secondary | ICD-10-CM

## 2021-01-02 DIAGNOSIS — J45998 Other asthma: Secondary | ICD-10-CM

## 2021-01-02 MED ORDER — DULERA 200-5 MCG/ACT IN AERO
2.0000 | INHALATION_SPRAY | Freq: Two times a day (BID) | RESPIRATORY_TRACT | 5 refills | Status: DC
Start: 2021-01-02 — End: 2021-05-22

## 2021-01-02 MED ORDER — TRIAMCINOLONE ACETONIDE 0.1 % EX CREA: 1.0000 "application " | TOPICAL_CREAM | Freq: Two times a day (BID) | CUTANEOUS | 0 refills | Status: DC

## 2021-01-02 MED ORDER — THYROID 90 MG PO TABS: 90.0000 mg | ORAL_TABLET | Freq: Every day | ORAL | 1 refills | Status: DC

## 2021-01-02 MED ORDER — PROAIR HFA 108 (90 BASE) MCG/ACT IN AERS: 2.0000 | INHALATION_SPRAY | Freq: Four times a day (QID) | RESPIRATORY_TRACT | 0 refills | Status: DC | PRN

## 2021-01-02 MED ORDER — MONTELUKAST SODIUM 10 MG PO TABS: 10.0000 mg | ORAL_TABLET | Freq: Every day | ORAL | 5 refills | Status: DC

## 2021-01-02 MED ORDER — CETIRIZINE HCL 10 MG PO TABS
10.0000 mg | ORAL_TABLET | Freq: Every day | ORAL | 1 refills | Status: DC
Start: 2021-01-02 — End: 2021-05-22

## 2021-01-02 MED ORDER — PIMECROLIMUS 1 % EX CREA: TOPICAL_CREAM | Freq: Two times a day (BID) | CUTANEOUS | 0 refills | Status: DC

## 2021-01-02 MED ORDER — ARMOUR THYROID 90 MG PO TABS: 90.0000 mg | ORAL_TABLET | Freq: Every day | ORAL | 1 refills | Status: DC

## 2021-01-03 LAB — CBC WITH DIFFERENTIAL/PLATELET
Absolute Monocytes: 606 cells/uL (ref 200–950)
Basophils Absolute: 44 cells/uL (ref 0–200)
Basophils Relative: 0.6 %
Eosinophils Absolute: 80 cells/uL (ref 15–500)
Eosinophils Relative: 1.1 %
HCT: 41.4 % (ref 35.0–45.0)
Hemoglobin: 14.2 g/dL (ref 11.7–15.5)
Lymphs Abs: 2825 cells/uL (ref 850–3900)
MCH: 31.6 pg (ref 27.0–33.0)
MCHC: 34.3 g/dL (ref 32.0–36.0)
MCV: 92.2 fL (ref 80.0–100.0)
MPV: 9.6 fL (ref 7.5–12.5)
Monocytes Relative: 8.3 %
Neutro Abs: 3745 cells/uL (ref 1500–7800)
Neutrophils Relative %: 51.3 %
Platelets: 326 10*3/uL (ref 140–400)
RBC: 4.49 10*6/uL (ref 3.80–5.10)
RDW: 12.5 % (ref 11.0–15.0)
Total Lymphocyte: 38.7 %
WBC: 7.3 10*3/uL (ref 3.8–10.8)

## 2021-01-03 LAB — C-REACTIVE PROTEIN: CRP: 3 mg/L (ref <8.0)

## 2021-01-17 ENCOUNTER — Encounter: Payer: Self-pay | Admitting: Family Medicine

## 2021-01-22 ENCOUNTER — Other Ambulatory Visit: Payer: Self-pay | Admitting: Family Medicine

## 2021-01-22 DIAGNOSIS — R21 Rash and other nonspecific skin eruption: Secondary | ICD-10-CM

## 2021-01-22 DIAGNOSIS — L299 Pruritus, unspecified: Secondary | ICD-10-CM

## 2021-01-28 ENCOUNTER — Encounter: Payer: Self-pay | Admitting: Family Medicine

## 2021-01-29 ENCOUNTER — Ambulatory Visit: Payer: Self-pay | Admitting: *Deleted

## 2021-01-29 NOTE — Telephone Encounter (Signed)
Pt reports large hemorrhoid,only one "Size of a grape." Reports onset 3 days ago. States has tried OTC Prep H, witch hazel tucks. Has tried "Tucking it back in rectum, nothing helping." Rates pain and itching at 8/10. Denies any constipation, no hard stools "But it's so painful to have BM." Denies any bleeding, no abdominal pain, no fever. States  "I don't want it to start bleeding."  Reports last had hemorrhoids 10 years ago.  Denies straining, no new exercises, no prolonged sitting.  Agent had secured appt for 03/05/21 first available, prior to triage. Assured pt NT would route to practice for PCPs review.  Advised may need UC eval.  Please advise: [phone]  Reason for Disposition . MODERATE-SEVERE rectal pain (i.e., interferes with school, work, or sleep)  Answer Assessment - Initial Assessment Questions 1. SYMPTOM:  "What's the main symptom you're concerned about?" (e.g., pain, itching, swelling, rash)    Hemorrhoid 1 external 2. ONSET: "When did the   start?"    3 days ago 3. RECTAL PAIN: "Do you have any pain around your rectum?" "How bad is the pain?"  (Scale 1-10; or mild, moderate, severe)  - MILD (1-3): doesn't interfere with normal activities   - MODERATE (4-7): interferes with normal activities or awakens from sleep, limping   - SEVERE (8-10): excruciating pain, unable to have a bowel movement      7-8/10 4. RECTAL ITCHING: "Do you have any itching in this area?" "How bad is the itching?"  (Scale 1-10; or mild, moderate, severe)  - MILD - doesn't interfere with normal activities   - MODERATE-SEVERE: interferes with normal activities or awakens from sleep     "Really bad." 5. CONSTIPATION: "Do you have constipation?" If Yes, ask: "How bad is it?"     No 6. CAUSE: "What do you think is causing the anus symptoms?"     1 Hemorrhoid. 7. OTHER SYMPTOMS: "Do you have any other symptoms?"  (e.g., rectal bleeding, abdominal pain, vomiting, fever)     No bleeding  Protocols used:  RECTAL [facility]

## 2021-02-02 ENCOUNTER — Other Ambulatory Visit: Payer: Self-pay | Admitting: Family Medicine

## 2021-02-02 DIAGNOSIS — J45998 Other asthma: Secondary | ICD-10-CM

## 2021-02-02 DIAGNOSIS — E039 Hypothyroidism, unspecified: Secondary | ICD-10-CM

## 2021-02-02 NOTE — Telephone Encounter (Signed)
Requested Prescriptions  Pending Prescriptions Disp Refills  . ARMOUR THYROID 90 MG tablet [Pharmacy Med Name: ARMOUR THYROID 90 MG TAB] 30 tablet     Sig: TAKE 1 TABLET BY MOUTH ONCE EVERY MORNING MNO-FRIDAY AND 1/2 TABLET ON SUNDAY     Endocrinology:  Hypothyroid Agents Failed - 02/02/2021 12:13 PM      Failed - TSH needs to be rechecked within 3 months after an abnormal result. Refill until TSH is due.      Passed - TSH in normal range and within 360 days    TSH  Date Value Ref Range Status  12/25/2020 4.18 mIU/L Final    Comment:              Reference Range .           > or = 20 Years  0.40-4.50 .                Pregnancy Ranges           First trimester    0.26-2.66           Second trimester   0.55-2.73           Third trimester    0.43-2.91          Passed - Valid encounter within last 12 months    Recent Outpatient Visits          1 month ago Leukocytosis, unspecified type   [facility] [place], Drue Stager, MD   1 month ago Conjunctivitis of both eyes, unspecified conjunctivitis type   [facility] Delsa Grana, PA-C   2 months ago [facility] Steele Sizer, MD   5 months ago Upper respiratory tract infection, unspecified type   [place], [place]   7 months ago Bipolar affective, mixed [facility])   [facility] Steele Sizer, MD      Future Appointments            In 1 month Steele Sizer, MD [facility], [place]   In 3 months Steele Sizer, MD [facility], [place]           . PROAIR HFA 108 (90 Base) MCG/ACT inhaler [Pharmacy Med Name: PROAIR HFA 108 (90 BASE) MCG/ACT IN] 8.5 g 0    Sig: INHALE 2 PUFFS INTO THE LUNGS EVERY 6 HOURS AS NEEDED FOR WHEEZING OR SHORTNESS OF BREATH     Pulmonology:  Beta Agonists Failed - 02/02/2021 12:13 PM      Failed - One inhaler should last at least one month. If the  patient is requesting refills earlier, contact the patient to check for uncontrolled symptoms.      Passed - Valid encounter within last 12 months    Recent Outpatient Visits          1 month ago Leukocytosis, unspecified type   [facility] [place], Drue Stager, MD   1 month ago Conjunctivitis of both eyes, unspecified conjunctivitis type   [facility] Delsa Grana, PA-C   2 months ago [facility] Steele Sizer, MD   5 months ago Upper respiratory tract infection, unspecified type   [facility] Lebron Conners D, MD   7 months ago Bipolar affective, mixed [facility])   [facility] Steele Sizer, MD      Future Appointments  In 1 month Steele Sizer, MD [facility], [place]   In 3 months Steele Sizer, MD [facility], [facility]

## 2021-02-18 ENCOUNTER — Emergency Department
Admission: EM | Admit: 2021-02-18 | Discharge: 2021-02-18 | Disposition: A | Discharge status: Home / Self Care | Payer: Medicaid Other | Attending: Emergency Medicine | Admitting: Emergency Medicine

## 2021-02-18 ENCOUNTER — Other Ambulatory Visit: Payer: Self-pay

## 2021-02-18 DIAGNOSIS — Z7951 Long term (current) use of inhaled steroids: Secondary | ICD-10-CM | POA: Diagnosis not present

## 2021-02-18 DIAGNOSIS — Z87891 Personal history of nicotine dependence: Secondary | ICD-10-CM | POA: Insufficient documentation

## 2021-02-18 DIAGNOSIS — L503 Dermatographic urticaria: Secondary | ICD-10-CM | POA: Insufficient documentation

## 2021-02-18 DIAGNOSIS — J45909 Unspecified asthma, uncomplicated: Secondary | ICD-10-CM | POA: Insufficient documentation

## 2021-02-18 DIAGNOSIS — E039 Hypothyroidism, unspecified: Secondary | ICD-10-CM | POA: Insufficient documentation

## 2021-02-18 NOTE — ED Triage Notes (Signed)
Pt c/o rash on body since January, pt states she has been seen by allergy dr and nothing has been determined, pt states she has been taking multiple antihistamines with no relief. Pt states it feels like something is crawling on her, denies pain, difficulty breathing/swallowing.

## 2021-02-18 NOTE — Discharge Instructions (Signed)
Please sleep with socks on your hands to minimize the scratching and opening wounds to your body.  Please use an anti-inflammatory such as naproxen, twice per day.  Please also follow-up with [facility] as soon as possible regarding your itching.

## 2021-02-20 ENCOUNTER — Ambulatory Visit: Payer: Medicaid Other | Admitting: Dermatology

## 2021-02-20 ENCOUNTER — Other Ambulatory Visit: Payer: Self-pay

## 2021-02-20 DIAGNOSIS — L501 Idiopathic urticaria: Secondary | ICD-10-CM

## 2021-02-20 NOTE — ED Provider Notes (Signed)
[facility] Emergency Department Provider Note  ____________________________________________   Event Date/Time   First MD Initiated Contact with Patient 02/18/21 2013     (approximate)  I have reviewed the triage vital signs and the nursing notes.   HISTORY  Chief Complaint Urticaria  HPI Renee Benjamin is a 40 y.o. female's who presents to the emergency department with chief complaint of urticaria.  The patient states that symptoms began in roughly January and have been persistent over the last several months.  She has had several visits with her primary care regarding this and has been sent for allergy testing and was told that she was not allergic to anything.  Her primary care has trialed several antihistamines including Benadryl, hydroxyzine, Zyrtec, Claritin as well as a trial of a oral steroid without prolonged improvement.  She states that she will improve for a short period and then it returns despite continued medication.  She has not yet seen dermatology, and is scheduled to be at the end of May regarding the symptoms.  Patient does report that it is worse after he exposures, improved with cold exposures.  She denies any throat or tongue swelling or itching, denies voice changes or shortness of breath.         Past Medical History:  Diagnosis Date  . ADHD (attention deficit hyperactivity disorder) 01/09/2016  . Allergy   . Anemia   . Anxiety   . Anxiety    separation anxiety  . Asthma   . Chronic right hip pain 04/10/2016  . Collagen vascular disease ([place])   . Endometriosis   . Fibromyalgia 01/09/2016  . Headache    otc med prn  . Hypothyroidism   . Plantar fasciitis of left foot 08.29.14   PLANTAR HEEL ,FLUID AROUND MEDIAL BAND  . Polycystic ovarian disease   . Polysubstance abuse ([place]) 01/22/2018   ER visit March 2019: Drug screen is positive for marijuana, cocaine, benzodiazepines, barbiturates, and opiates  . Stress fracture 08.15.14    RIGHT   . Thoracic back pain   . Thyroid disease   . Vitamin D deficiency disease     Patient Active Problem List   Diagnosis Date Noted  . History of syncope 11/29/2020  . Closed fracture of navicular bone of foot 10/31/2020  . Sprain of right ankle 10/31/2020  . History of aspiration pneumonia 10/14/2019  . Bipolar affective, mixed ([place]) 03/24/2019  . Anxiety 03/08/2019  . History of pancreatitis 08/20/2018  . Polysubstance abuse ([place]) 01/22/2018  . Moderate recurrent major depression ([place]) 01/21/2018  . Cocaine abuse ([place]) 01/21/2018  . Cannabis abuse 01/21/2018  . Benzodiazepine abuse ([place]) 01/21/2018  . Barbiturate abuse ([place]) 01/21/2018  . Opiate abuse, episodic ([place]) 01/21/2018  . Allergic rhinitis 02/24/2017  . Vitamin B12 deficiency 02/24/2017  . Chronic right hip pain 04/10/2016  . Controlled substance agreement signed 02/01/2016  . Obesity 02/01/2016  . ADHD (attention deficit hyperactivity disorder) 01/09/2016  . Fibromyalgia 01/09/2016  . Chronic fatigue 12/04/2015  . Thoracic back pain 10/02/2015  . Lumbar pain 10/02/2015  . Chronic tonsillitis 08/20/2015  . Migraine headache with aura 04/10/2015  . Hypothyroidism 04/10/2015  . PTSD (post-traumatic stress disorder) 04/10/2015  . Status post laparoscopic hysterectomy 01/03/2015  . Chronic female pelvic pain 11/23/2014  . Plantar fasciitis of left foot   . Stress fracture   . Asthma 12/16/2012  . Bipolar affective disorder, depressed, moderate ([place]) 12/16/2012  . Encounter for supervision of normal pregnancy in multigravida  12/14/2012    Past Surgical History:  Procedure Laterality Date  . ABDOMINAL HYSTERECTOMY  March 2016   Due to uterus being stiched into C Section incision  . APPENDECTOMY    . APPENDECTOMY  2008  . CESAREAN SECTION    . CESAREAN SECTION  2014  . LAPAROSCOPIC BILATERAL SALPINGECTOMY Bilateral 01/02/2015   Procedure: LAPAROSCOPIC BILATERAL SALPINGECTOMY;  Surgeon: Osborne Oman,  MD;  Location: [facility];  Service: Gynecology;  Laterality: Bilateral;  . LAPAROSCOPIC HYSTERECTOMY N/A 01/02/2015   Procedure: HYSTERECTOMY TOTAL LAPAROSCOPIC;  Surgeon: Osborne Oman, MD;  Location: [facility];  Service: Gynecology;  Laterality: N/A;  . SHOULDER SURGERY Right   . SHOULDER SURGERY  2001   right    Prior to Admission medications   Medication Sig Start Date End Date Taking? Authorizing Provider  amphetamine-dextroamphetamine (ADDERALL) 20 MG tablet Take 20 mg by mouth 2 (two) times daily. 01/26/19   [provider]  ARMOUR THYROID 90 MG tablet Take 1 tablet (90 mg total) by mouth daily. Take one M-F and one Sundays take only half pill 01/02/21   Steele Sizer, MD  cariprazine [facility]) capsule Take 1 capsule by mouth daily.    [facility]  cetirizine (ZYRTEC) 10 MG tablet Take 1 tablet (10 mg total) by mouth at bedtime. 01/02/21   Steele Sizer, MD  clonazePAM (KLONOPIN) 0.5 MG tablet Take 0.5 mg by mouth 2 (two) times daily as needed for anxiety.    [facility]  fexofenadine (ALLEGRA) 180 MG tablet Take 1 tablet (180 mg total) by mouth daily. 12/05/20   Delsa Grana, PA-C  hydrOXYzine (ATARAX/VISTARIL) 10 MG tablet Take 1-2 tablets (10-20 mg total) by mouth 3 (three) times daily as needed. 12/28/20   Steele Sizer, MD  mometasone-formoterol (DULERA) 200-5 MCG/ACT AERO Inhale 2 puffs into the lungs 2 (two) times daily. 01/02/21   Steele Sizer, MD  montelukast (SINGULAIR) 10 MG tablet Take 1 tablet (10 mg total) by mouth daily. 01/02/21   Steele Sizer, MD  pimecrolimus (ELIDEL) 1 % cream Apply topically 2 (two) times daily. 01/02/21   Steele Sizer, MD  PROAIR HFA 108 [phone] Base) MCG/ACT inhaler INHALE 2 PUFFS INTO THE LUNGS EVERY 6 HOURS AS NEEDED FOR WHEEZING OR SHORTNESS OF BREATH 02/02/21   Steele Sizer, MD  triamcinolone (KENALOG) 0.1 % Apply 1 application topically 2 (two) times daily. 01/02/21   Steele Sizer,  MD  triamcinolone 0.1% oint-Cerave equivalent lotion 1:1 mixture Apply topically 4 (four) times daily. 12/17/20   Steele Sizer, MD  zolpidem (AMBIEN) 10 MG tablet Take 1 tablet by mouth daily.    [facility]    Allergies Depakote [divalproex sodium], Morphine, Pneumococcal vaccines, Prednisone, Propoxyphene, Tramadol, Tramadol, Darvocet [propoxyphene n-acetaminophen], and Valproic acid  Family History  Problem Relation Age of Onset  . Diabetes Father   . Osteoporosis Mother   . Breast cancer Mother   . Diabetes Mother   . Cancer Maternal Grandmother        breast  . Osteoporosis Maternal Grandmother   . Breast cancer Maternal Grandmother     Social History Social History   Tobacco Use  . Smoking status: Former Smoker    Packs/day: 0.25    Years: 4.00    Pack years: 1.00    Types: Cigarettes    Quit date: 04/17/2012    Years since quitting: 8.8  . Smokeless tobacco: Never Used  Vaping Use  .  Vaping Use: Never used  Substance Use Topics  . Alcohol use: No    Alcohol/week: 0.0 standard drinks  . Drug use: Not Currently    Types: Marijuana, Cocaine    Review of Systems Constitutional: No fever/chills Eyes: No visual changes. ENT: No sore throat. Cardiovascular: Denies chest pain. Respiratory: Denies shortness of breath. Gastrointestinal: No abdominal pain.  No nausea, no vomiting.  No diarrhea.  No constipation. Genitourinary: Negative for dysuria. Musculoskeletal: Negative for back pain. Skin:+ rash. Neurological: Negative for headaches, focal weakness or numbness.  ____________________________________________   PHYSICAL EXAM:  VITAL SIGNS: ED Triage Vitals [02/18/21 1935]  Enc Vitals Group     BP 133/84     Pulse Rate 79     Resp 16     Temp 98.4 F (36.9 C)     Temp Source Oral     SpO2 99 %     Weight 208 lb (94.3 kg)     Height 5\' 8"  (1.727 m)     Head Circumference      Peak Flow      Pain Score 0     Pain Loc       Pain Edu?      Excl. in [place]?    Constitutional: Alert and oriented. Well appearing and in no acute distress. Eyes: Conjunctivae are normal. PERRL. EOMI. Head: Atraumatic. Nose: No congestion/rhinnorhea. Mouth/Throat: Mucous membranes are moist.  Oropharynx non-erythematous. Neck: No stridor.   Cardiovascular: Normal rate, regular rhythm. Grossly normal heart sounds.  Good peripheral circulation. Respiratory: Normal respiratory effort.  No retractions. Lungs CTAB. Gastrointestinal: Soft and nontender. No distention. No abdominal bruits. No CVA tenderness. Musculoskeletal: No lower extremity tenderness nor edema.  No joint effusions. Neurologic:  Normal speech and language. No gross focal neurologic deficits are appreciated. No gait instability. Skin: There is a diffuse erythematous rash consistent with urticaria, worse on the upper torso than anywhere else.  Rash is instantaneously worsened with dermatographia.  Excoriations present in the lateral aspects of the upper torso where the patient can reach for scratching. Psychiatric: Mood and affect are normal. Speech and behavior are normal.  ____________________________________________   INITIAL IMPRESSION / ASSESSMENT AND PLAN / ED COURSE  As part of my medical decision making, I reviewed the following data within the [place] notes reviewed and incorporated, Old chart reviewed and Notes from prior ED visits        Patient is a 40 year old female who presents to the emergency department for evaluation of urticaria.  This has been chronic over the last several months, not particularly worsened today.  She reports that this is so severe that she "cannot live with this until the end of May".  Old record was reviewed from primary care visits with Dr. Ancil Boozer.  The patient has received extensive work-up including ruling out obvious liver, thyroid or other system dysfunction as the cause.  The patient has also been  sent for allergy testing and was negative for any allergens.  She has already trialed many of the medications that are recommended for this condition with failure.  The patient states that she does not want to try another oral steroid given that this sometimes causes her mind to become less stable with her bipolar disorder.  The case was discussed with Dr. Cheri Fowler, who agrees that there are not many other options that we currently can offer the patient.  I will recommend close follow-up with dermatology, and asked that she call their  office tomorrow to try to see if she can be seen sooner.  The patient is understanding and amenable with this plan.  She stable at this time for outpatient follow-up.      ____________________________________________   FINAL CLINICAL IMPRESSION(S) / ED DIAGNOSES  Final diagnoses:  Dermatographic urticaria     ED Discharge Orders    None      *Please note:  Renee Benjamin was evaluated in Emergency Department on 02/20/2021 for the symptoms described in the history of present illness. She was evaluated in the context of the global COVID-19 pandemic, which necessitated consideration that the patient might be at risk for infection with the SARS-CoV-2 virus that causes COVID-19. Institutional protocols and algorithms that pertain to the evaluation of patients at risk for COVID-19 are in a state of rapid change based on information released by regulatory bodies including the CDC and federal and state organizations. These policies and algorithms were followed during the patient's care in the ED.  Some ED evaluations and interventions may be delayed as a result of limited staffing during and the pandemic.*   Note:  This document was prepared using Dragon voice recognition software and may include unintentional dictation errors.   Marlana Salvage, PA 02/20/21 1424    Naaman Plummer, MD 02/22/21 [phone]

## 2021-02-20 NOTE — Progress Notes (Signed)
   New Patient Visit  Subjective  Renee Benjamin is a 40 y.o. female who presents for the following: Rash (New pt c/o itchy rash on the back on arms every day x 3 months , worse when pt get hot, friction, when pressure is applied to skin, she has to take cold showers, treating with Zyrtec qhs, Singulair qhs, Claritin qam,  Allegra qam and Triamcinolone cream). Pt had a allergy prick testing which only showed allergy to mold.  Pt has Fibromyalgia   The following portions of the chart were reviewed this encounter and updated as appropriate:   Tobacco  Allergies  Meds  Problems  Med Hx  Surg Hx  Fam Hx     Review of Systems:  No other skin or systemic complaints except as noted in HPI or Assessment and Plan.  Objective  Well appearing patient in no apparent distress; mood and affect are within normal limits.  A focused examination was performed including face, neck, chest and back. Relevant physical exam findings are noted in the Assessment and Plan.  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  IMarye Round, CMA, am acting as scribe for Sarina Ser, MD .  Documentation: I have reviewed the above documentation for accuracy and completeness, and I agree with the above.  Sarina Ser, MD

## 2021-02-20 NOTE — Patient Instructions (Addendum)
  D/c Zyrtec  Start Allegra 180 mg take 2 tablets in the morning  Start Claritin take 1 tablet at bedtime  Start Singulair 1 tablet at bedtime    Start otc Zantac 360 daily

## 2021-02-21 ENCOUNTER — Other Ambulatory Visit: Payer: Self-pay | Admitting: Family Medicine

## 2021-02-21 DIAGNOSIS — R21 Rash and other nonspecific skin eruption: Secondary | ICD-10-CM

## 2021-02-24 ENCOUNTER — Encounter: Payer: Self-pay | Admitting: Dermatology

## 2021-02-26 ENCOUNTER — Telehealth: Payer: Self-pay

## 2021-02-26 LAB — ALPHA-GAL PANEL
Allergen Lamb IgE: <0.1 kU/L
Beef IgE: <0.1 kU/L
IgE (Immunoglobulin E), Serum: 8 IU/mL (ref 6–495)
O215-IgE Alpha-Gal: <0.1 kU/L
Pork IgE: <0.1 kU/L

## 2021-02-26 NOTE — Telephone Encounter (Signed)
Patient informed of lab results. 

## 2021-02-26 NOTE — Telephone Encounter (Signed)
-----   Message from Ralene Bathe, MD sent at 02/26/2021  9:21 AM EDT ----- IgE and Alpha-Gal testing negative/ Normal

## 2021-03-04 ENCOUNTER — Encounter: Payer: Self-pay | Admitting: Family Medicine

## 2021-03-05 ENCOUNTER — Ambulatory Visit: Payer: Medicaid Other | Admitting: Family Medicine

## 2021-03-05 ENCOUNTER — Other Ambulatory Visit: Payer: Self-pay | Admitting: Family Medicine

## 2021-03-05 DIAGNOSIS — J45998 Other asthma: Secondary | ICD-10-CM

## 2021-03-05 DIAGNOSIS — E039 Hypothyroidism, unspecified: Secondary | ICD-10-CM

## 2021-03-07 ENCOUNTER — Ambulatory Visit: Payer: Medicaid Other | Admitting: Dermatology

## 2021-03-07 ENCOUNTER — Other Ambulatory Visit: Payer: Self-pay

## 2021-03-07 DIAGNOSIS — L509 Urticaria, unspecified: Secondary | ICD-10-CM

## 2021-03-07 DIAGNOSIS — L501 Idiopathic urticaria: Secondary | ICD-10-CM | POA: Diagnosis not present

## 2021-03-07 MED ORDER — OMALIZUMAB 150 MG/ML ~~LOC~~ SOSY: 300.0000 mg | PREFILLED_SYRINGE | SUBCUTANEOUS | 5 refills | Status: DC

## 2021-03-07 NOTE — Progress Notes (Signed)
   Follow-Up Visit   Subjective  Renee Benjamin is a 40 y.o. female who presents for the following: Follow-up (2 wk f/u for urticaria. Pt reports that it is getting worse. She states that it is spreading to her legs. She has been treating with allegra 180 mg 2 tabs QAM, claritin 10 mg QHS, singulair 10 mg QS, and zantac 360 QD.  Alpha-Gal testing was negative. ).  The following portions of the chart were reviewed this encounter and updated as appropriate:  Tobacco  Allergies  Meds  Problems  Med Hx  Surg Hx  Fam Hx     Review of Systems: No other skin or systemic complaints except as noted in HPI or Assessment and Plan.  Objective  Well appearing patient in no apparent distress; mood and affect are within normal limits.  A focused examination was performed including arms, back, chest. Relevant physical exam findings are noted in the Assessment and Plan.  Objective  arms, back, chest: Urticarial papules on back. Photos shown today show urticaria papules.   Assessment & Plan  Urticaria -chronic idiopathic urticaria greater than 3 months arms, back, chest Start Xolair pending insurance approval. Inject 300 mg once every month.  Continue Allegra 180 mg take 2 tablets in the morning  Continue Claritin take 1 tablet at night  Continue Singulair 1 tablet at night   Continue otc Zantac 360 daily  omalizumab Arvid Right) 150 MG/ML prefilled syringe - arms, back, chest  Return in about 1 week (around 03/14/2021) for urticaria / start xolair.   IHarriett Sine, CMA, am acting as scribe for Sarina Ser, MD.  Documentation: I have reviewed the above documentation for accuracy and completeness, and I agree with the above.  Sarina Ser, MD

## 2021-03-11 ENCOUNTER — Encounter: Payer: Self-pay | Admitting: Dermatology

## 2021-03-13 ENCOUNTER — Ambulatory Visit: Payer: Medicaid Other | Admitting: Dermatology

## 2021-03-13 ENCOUNTER — Other Ambulatory Visit: Payer: Self-pay

## 2021-03-13 DIAGNOSIS — L509 Urticaria, unspecified: Secondary | ICD-10-CM

## 2021-03-13 MED ORDER — OMALIZUMAB 150 MG/ML ~~LOC~~ SOSY
150.0000 mg | PREFILLED_SYRINGE | Freq: Once | SUBCUTANEOUS | Status: AC
  Administered 2021-03-13: 150 mg via SUBCUTANEOUS

## 2021-03-13 NOTE — Patient Instructions (Signed)

## 2021-03-13 NOTE — Progress Notes (Signed)
   Follow-Up Visit   Subjective  Renee Benjamin is a 41 y.o. female who presents for the following: Follow-up (Urticaria follow up - Start Xolair today).  The following portions of the chart were reviewed this encounter and updated as appropriate:   Tobacco  Allergies  Meds  Problems  Med Hx  Surg Hx  Fam Hx     Review of Systems:  No other skin or systemic complaints except as noted in HPI or Assessment and Plan.  Objective  Well appearing patient in no apparent distress; mood and affect are within normal limits.  A focused examination was performed including trunk, extremities. Relevant physical exam findings are noted in the Assessment and Plan.  Objective  Trunk, extremities: Urticarial plaques    Assessment & Plan  Chronic Idiopathic Urticaria - persistent and severe Trunk, extremities Chronic and persistent  Continue Allegra 180 mg take 2 tablets in the morning  Continue Claritin take 1 tablet at night  Continue Singulair 1 tablet at night   Continue otc Zantac 360 daily  Start Xolair injections today.   Xolair 150 mg/ml 1 injection to left upper arm, 1 injection to right upper arm today   B/P:  10:05  80/64  10:20  86/59  10:35  92/63  Over 45 minutes spent with pt in evaluation and treatment.  omalizumab Arvid Right) prefilled syringe 150 mg - Trunk, extremities  Return in about 30 days (around 04/12/2021) for Xolair injections.  I, Ashok Cordia, CMA, am acting as scribe for Sarina Ser, MD .  Documentation: I have reviewed the above documentation for accuracy and completeness, and I agree with the above.  Sarina Ser, MD

## 2021-03-18 ENCOUNTER — Emergency Department
Admission: EM | Admit: 2021-03-18 | Discharge: 2021-03-18 | Disposition: A | Discharge status: Home / Self Care | Payer: Medicaid Other | Attending: Emergency Medicine | Admitting: Emergency Medicine

## 2021-03-18 ENCOUNTER — Emergency Department: Payer: Medicaid Other

## 2021-03-18 ENCOUNTER — Other Ambulatory Visit: Payer: Self-pay

## 2021-03-18 ENCOUNTER — Encounter: Payer: Self-pay | Admitting: Emergency Medicine

## 2021-03-18 DIAGNOSIS — E039 Hypothyroidism, unspecified: Secondary | ICD-10-CM | POA: Insufficient documentation

## 2021-03-18 DIAGNOSIS — Z7951 Long term (current) use of inhaled steroids: Secondary | ICD-10-CM | POA: Insufficient documentation

## 2021-03-18 DIAGNOSIS — R059 Cough, unspecified: Secondary | ICD-10-CM | POA: Diagnosis present

## 2021-03-18 DIAGNOSIS — J45909 Unspecified asthma, uncomplicated: Secondary | ICD-10-CM | POA: Insufficient documentation

## 2021-03-18 DIAGNOSIS — Z87891 Personal history of nicotine dependence: Secondary | ICD-10-CM | POA: Insufficient documentation

## 2021-03-18 DIAGNOSIS — J9801 Acute bronchospasm: Secondary | ICD-10-CM | POA: Diagnosis not present

## 2021-03-18 MED ORDER — METHYLPREDNISOLONE 4 MG PO TBPK: ORAL_TABLET | ORAL | 0 refills | Status: DC

## 2021-03-18 MED ORDER — IPRATROPIUM-ALBUTEROL 0.5-2.5 (3) MG/3ML IN SOLN
3.0000 mL | Freq: Once | RESPIRATORY_TRACT | Status: AC
  Administered 2021-03-18: 3 mL via RESPIRATORY_TRACT
  Filled 2021-03-18: qty 3

## 2021-03-18 MED ORDER — DOXYCYCLINE MONOHYDRATE 100 MG PO CAPS: 100.0000 mg | ORAL_CAPSULE | Freq: Two times a day (BID) | ORAL | 0 refills | Status: DC

## 2021-03-18 MED ORDER — METHYLPREDNISOLONE SODIUM SUCC 125 MG IJ SOLR
125.0000 mg | Freq: Once | INTRAMUSCULAR | Status: AC
  Administered 2021-03-18: 125 mg via INTRAMUSCULAR
  Filled 2021-03-18: qty 2

## 2021-03-18 MED ORDER — PSEUDOEPH-BROMPHEN-DM 30-2-10 MG/5ML PO SYRP: 5.0000 mL | ORAL_SOLUTION | Freq: Four times a day (QID) | ORAL | 0 refills | Status: DC | PRN

## 2021-03-18 NOTE — ED Provider Notes (Signed)
[facility] Emergency Department Provider Note   ____________________________________________   Event Date/Time   First MD Initiated Contact with Patient 03/18/21 [phone]     (approximate)  I have reviewed the triage vital signs and the nursing notes.   HISTORY  Chief Complaint Cough    HPI Renee Benjamin is a 40 y.o. female patient presents with cough and asthma symptoms for 8 days.  Patient seen by PCP and given Augmentin and nebulizers.  Patient did treat her bronchitis.  She states that the cough is still present and she can taste "infection" with cough.  Patient is afraid she is developing pneumonia.         Past Medical History:  Diagnosis Date  . ADHD (attention deficit hyperactivity disorder) 01/09/2016  . Allergy   . Anemia   . Anxiety   . Anxiety    separation anxiety  . Asthma   . Chronic right hip pain 04/10/2016  . Collagen vascular disease ([place])   . Endometriosis   . Fibromyalgia 01/09/2016  . Headache    otc med prn  . Hypothyroidism   . Plantar fasciitis of left foot 08.29.14   PLANTAR HEEL ,FLUID AROUND MEDIAL BAND  . Polycystic ovarian disease   . Polysubstance abuse ([place]) 01/22/2018   ER visit March 2019: Drug screen is positive for marijuana, cocaine, benzodiazepines, barbiturates, and opiates  . Stress fracture 08.15.14   RIGHT   . Thoracic back pain   . Thyroid disease   . Vitamin D deficiency disease     Patient Active Problem List   Diagnosis Date Noted  . History of syncope 11/29/2020  . Closed fracture of navicular bone of foot 10/31/2020  . Sprain of right ankle 10/31/2020  . History of aspiration pneumonia 10/14/2019  . Bipolar affective, mixed ([place]) 03/24/2019  . Anxiety 03/08/2019  . History of pancreatitis 08/20/2018  . Polysubstance abuse ([place]) 01/22/2018  . Moderate recurrent major depression ([place]) 01/21/2018  . Cocaine abuse ([place]) 01/21/2018  . Cannabis abuse 01/21/2018  . Benzodiazepine abuse  ([place]) 01/21/2018  . Barbiturate abuse ([place]) 01/21/2018  . Opiate abuse, episodic ([place]) 01/21/2018  . Allergic rhinitis 02/24/2017  . Vitamin B12 deficiency 02/24/2017  . Chronic right hip pain 04/10/2016  . Controlled substance agreement signed 02/01/2016  . Obesity 02/01/2016  . ADHD (attention deficit hyperactivity disorder) 01/09/2016  . Fibromyalgia 01/09/2016  . Chronic fatigue 12/04/2015  . Thoracic back pain 10/02/2015  . Lumbar pain 10/02/2015  . Chronic tonsillitis 08/20/2015  . Migraine headache with aura 04/10/2015  . Hypothyroidism 04/10/2015  . PTSD (post-traumatic stress disorder) 04/10/2015  . Status post laparoscopic hysterectomy 01/03/2015  . Chronic female pelvic pain 11/23/2014  . Plantar fasciitis of left foot   . Stress fracture   . Asthma 12/16/2012  . Bipolar affective disorder, depressed, moderate ([place]) 12/16/2012  . Encounter for supervision of normal pregnancy in multigravida 12/14/2012    Past Surgical History:  Procedure Laterality Date  . ABDOMINAL HYSTERECTOMY  March 2016   Due to uterus being stiched into C Section incision  . APPENDECTOMY    . APPENDECTOMY  2008  . CESAREAN SECTION    . CESAREAN SECTION  2014  . LAPAROSCOPIC BILATERAL SALPINGECTOMY Bilateral 01/02/2015   Procedure: LAPAROSCOPIC BILATERAL SALPINGECTOMY;  Surgeon: Osborne Oman, MD;  Location: [facility];  Service: Gynecology;  Laterality: Bilateral;  . LAPAROSCOPIC HYSTERECTOMY N/A 01/02/2015   Procedure: HYSTERECTOMY TOTAL LAPAROSCOPIC;  Surgeon: Osborne Oman, MD;  Location: [facility];  Service: Gynecology;  Laterality: N/A;  . SHOULDER SURGERY Right   . SHOULDER SURGERY  2001   right    Prior to Admission medications   Medication Sig Start Date End Date Taking? Authorizing Provider  brompheniramine-pseudoephedrine-DM 30-2-10 MG/5ML syrup Take 5 mLs by mouth 4 (four) times daily as needed. 03/18/21  Yes Sable Feil, PA-C  doxycycline (MONODOX) 100 MG capsule Take 1 capsule  (100 mg total) by mouth 2 (two) times daily. 03/18/21  Yes Sable Feil, PA-C  methylPREDNISolone (MEDROL DOSEPAK) 4 MG TBPK tablet Take Tapered dose as directed 03/18/21  Yes Sable Feil, PA-C  amphetamine-dextroamphetamine (ADDERALL) 20 MG tablet Take 20 mg by mouth 2 (two) times daily. 01/26/19   [provider]  ARMOUR THYROID 90 MG tablet TAKE 1 TABLET BY MOUTH ONCE EVERY MORNING MNO-FRIDAY AND 1/2 TABLET ON SUNDAY 03/05/21   Sowles, Drue Stager, MD  cariprazine (VRAYLAR) capsule Take 1 capsule by mouth daily.    [facility]  cetirizine (ZYRTEC) 10 MG tablet Take 1 tablet (10 mg total) by mouth at bedtime. 01/02/21   Steele Sizer, MD  clonazePAM (KLONOPIN) 0.5 MG tablet Take 0.5 mg by mouth 2 (two) times daily as needed for anxiety.    [facility]  ELIDEL 1 % cream APPLY TO AFFECTED AREA(s) TWICE DAILY ASDIRECTED 02/21/21   Steele Sizer, MD  fexofenadine (ALLEGRA) 180 MG tablet Take 1 tablet (180 mg total) by mouth daily. 12/05/20   Delsa Grana, PA-C  hydrOXYzine (ATARAX/VISTARIL) 10 MG tablet Take 1-2 tablets (10-20 mg total) by mouth 3 (three) times daily as needed. 12/28/20   Steele Sizer, MD  mometasone-formoterol (DULERA) 200-5 MCG/ACT AERO Inhale 2 puffs into the lungs 2 (two) times daily. 01/02/21   Steele Sizer, MD  montelukast (SINGULAIR) 10 MG tablet Take 1 tablet (10 mg total) by mouth daily. 01/02/21   Steele Sizer, MD  PROAIR HFA 108 [id number] Base) MCG/ACT inhaler INHALE 2 PUFFS INTO THE LUNGS EVERY 6 HOURS AS NEEDED FOR WHEEZING OR SHORTNESS OF BREATH 03/05/21   Steele Sizer, MD  triamcinolone 0.1% oint-Cerave equivalent lotion 1:1 mixture Apply topically 4 (four) times daily. 12/17/20   Steele Sizer, MD  triamcinolone cream (KENALOG) 0.1 % APPLY TOPICALLY TWICE A DAY 02/21/21   Ancil Boozer, Drue Stager, MD  zolpidem (AMBIEN) 10 MG tablet Take 1 tablet by mouth daily.    [facility]     Allergies Depakote [divalproex sodium], Morphine, Pneumococcal vaccines, Prednisone, Propoxyphene, Tramadol, Tramadol, Darvocet [propoxyphene n-acetaminophen], and Valproic acid  Family History  Problem Relation Age of Onset  . Diabetes Father   . Osteoporosis Mother   . Breast cancer Mother   . Diabetes Mother   . Cancer Maternal Grandmother        breast  . Osteoporosis Maternal Grandmother   . Breast cancer Maternal Grandmother     Social History Social History   Tobacco Use  . Smoking status: Former Smoker    Packs/day: 0.25    Years: 4.00    Pack years: 1.00    Types: Cigarettes    Quit date: 04/17/2012    Years since quitting: 8.9  . Smokeless tobacco: Never Used  Vaping Use  . Vaping Use: Never used  Substance Use Topics  . Alcohol use: No    Alcohol/week: 0.0 standard drinks  . Drug use: Not Currently    Types: Marijuana, Cocaine    Review of Systems  Constitutional: No fever/chills Eyes: No visual changes. ENT: No sore throat. Cardiovascular: Denies chest pain. Respiratory: Denies shortness of breath. Gastrointestinal: No abdominal pain.  No nausea, no vomiting.  No diarrhea.  No constipation. Genitourinary: Negative for dysuria. Musculoskeletal: Negative for back pain. Skin: Negative for rash. Neurological: Negative for headaches, focal weakness or numbness. Psychiatric:  Anxiety and ADHD. Endocrine:  Hypothyroidism. Hematological/Lymphatic:  Allergic/Immunilogical: Depakote, morphine, pneumococcal vaccine, tramadol, dialysis, valproic acid. ____________________________________________   PHYSICAL EXAM:  VITAL SIGNS: ED Triage Vitals  Enc Vitals Group     BP 03/18/21 0546 103/71     Pulse Rate 03/18/21 0546 71     Resp 03/18/21 0546 (!) 22     Temp 03/18/21 0546 98.6 F (37 C)     Temp Source 03/18/21 0546 Oral     SpO2 03/18/21 0546 98 %     Weight 03/18/21 0549 207 lb 6.4 oz (94.1 kg)     Height 03/18/21 0549 5\' 8"  (1.727 m)      Head Circumference --      Peak Flow --      Pain Score 03/18/21 0548 6     Pain Loc --      Pain Edu? --      Excl. in [place]? --     Constitutional: Alert and oriented. Well appearing and in no acute distress. Eyes: Conjunctivae are normal. PERRL. EOMI. Head: Atraumatic. Nose: No congestion/rhinnorhea. Mouth/Throat: Mucous membranes are moist.  Oropharynx non-erythematous. Neck: No stridor. Hematological/Lymphatic/Immunilogical: No cervical lymphadenopathy. Cardiovascular: Normal rate, regular rhythm. Grossly normal heart sounds.  Good peripheral circulation. Respiratory: Normal respiratory effort.  No retractions. Lungs bilateral wheezing. Neurologic:  Normal speech and language. No gross focal neurologic deficits are appreciated. No gait instability. Skin:  Skin is warm, dry and intact. No rash noted. Psychiatric: Mood and affect are normal. Speech and behavior are normal.  ____________________________________________   LABS (all labs ordered are listed, but only abnormal results are displayed)  Labs Reviewed - No data to display ____________________________________________  EKG   ____________________________________________  RADIOLOGY I, Sable Feil, personally viewed and evaluated these images (plain radiographs) as part of my medical decision making, as well as reviewing the written report by the radiologist.  ED MD interpretation: No acute findings on chest x-ray. Official radiology report(s): DG Chest 2 View  Result Date: 03/18/2021 CLINICAL DATA:  Cough.  History of asthma EXAM: CHEST - 2 VIEW COMPARISON:  10/14/2019 FINDINGS: Normal heart size and mediastinal contours. No acute infiltrate or edema. No effusion or pneumothorax. Pectus excavatum. No acute osseous findings. IMPRESSION: No active cardiopulmonary disease. Electronically Signed   By: Monte Fantasia M.D.   On: 03/18/2021 07:04     ____________________________________________   PROCEDURES  Procedure(s) performed (including Critical Care):  Procedures   ____________________________________________   INITIAL IMPRESSION / ASSESSMENT AND PLAN / ED COURSE  As part of my medical decision making, I reviewed the following data within the [place]         Patient presents with 1 week of cough and wheezing.  Differentials consist of asthma, bronchitis, pneumonia.  Discussed no acute findings on chest x-ray.  Patient complaint physical exam consistent with cough due to bronchospasm.  Patient given discharge care instruction advised take medication as directed.  Follow-up with PCP.      ____________________________________________   FINAL CLINICAL IMPRESSION(S) / ED DIAGNOSES  Final diagnoses:  Cough due to bronchospasm     ED Discharge Orders  Ordered    doxycycline (MONODOX) 100 MG capsule  2 times daily        03/18/21 0745    methylPREDNISolone (MEDROL DOSEPAK) 4 MG TBPK tablet        03/18/21 0745    brompheniramine-pseudoephedrine-DM 30-2-10 MG/5ML syrup  4 times daily PRN        03/18/21 0745          *Please note:  KAYDREE FEJES was evaluated in Emergency Department on 03/18/2021 for the symptoms described in the history of present illness. She was evaluated in the context of the global COVID-19 pandemic, which necessitated consideration that the patient might be at risk for infection with the SARS-CoV-2 virus that causes COVID-19. Institutional protocols and algorithms that pertain to the evaluation of patients at risk for COVID-19 are in a state of rapid change based on information released by regulatory bodies including the CDC and federal and state organizations. These policies and algorithms were followed during the patient's care in the ED.  Some ED evaluations and interventions may be delayed as a result of limited staffing during and the  pandemic.*   Note:  This document was prepared using Dragon voice recognition software and may include unintentional dictation errors.    Sable Feil, PA-C 03/18/21 0750    Arta Silence, MD 03/18/21 [phone]

## 2021-03-18 NOTE — ED Triage Notes (Signed)
Pt reports that she developed a cough and asthma symptoms last Sunday, she went to her PMD and they gave her Aumentin and nebulizer. They diagnosised her with Bronchitis. She reports that her cough is still there and she feels like she can taste an infection. She is afraid that she getting pneumonia

## 2021-03-18 NOTE — Discharge Instructions (Addendum)
Your chest x-ray was negative for pneumonia.  Follow discharge care instruction take medication as directed.

## 2021-03-18 NOTE — ED Notes (Signed)
See triage note  Presents with cough,nasal congestion fatigue and diarrhea  States her sx's started last Sunday  Was seen by her PCP dx'd with bronchitis on Tuesday   Placed on Augmentin and SVN treatments  States she feel like she is worse   Had negative COVID and flu test at that time  Afebrile on arrival

## 2021-03-19 ENCOUNTER — Encounter: Payer: Self-pay | Admitting: Dermatology

## 2021-03-21 ENCOUNTER — Ambulatory Visit: Payer: Medicaid Other | Admitting: Dermatology

## 2021-03-24 ENCOUNTER — Other Ambulatory Visit: Payer: Self-pay

## 2021-03-24 ENCOUNTER — Encounter: Payer: Self-pay | Admitting: Emergency Medicine

## 2021-03-24 ENCOUNTER — Emergency Department: Payer: Medicaid Other

## 2021-03-24 ENCOUNTER — Emergency Department
Admission: EM | Admit: 2021-03-24 | Discharge: 2021-03-24 | Disposition: A | Discharge status: Home / Self Care | Payer: Medicaid Other | Attending: Emergency Medicine | Admitting: Emergency Medicine

## 2021-03-24 DIAGNOSIS — E039 Hypothyroidism, unspecified: Secondary | ICD-10-CM | POA: Diagnosis not present

## 2021-03-24 DIAGNOSIS — X58XXXA Exposure to other specified factors, initial encounter: Secondary | ICD-10-CM | POA: Insufficient documentation

## 2021-03-24 DIAGNOSIS — J209 Acute bronchitis, unspecified: Secondary | ICD-10-CM

## 2021-03-24 DIAGNOSIS — Z87891 Personal history of nicotine dependence: Secondary | ICD-10-CM | POA: Diagnosis not present

## 2021-03-24 DIAGNOSIS — S299XXA Unspecified injury of thorax, initial encounter: Secondary | ICD-10-CM | POA: Diagnosis present

## 2021-03-24 DIAGNOSIS — J45909 Unspecified asthma, uncomplicated: Secondary | ICD-10-CM | POA: Insufficient documentation

## 2021-03-24 DIAGNOSIS — S29011A Strain of muscle and tendon of front wall of thorax, initial encounter: Secondary | ICD-10-CM | POA: Insufficient documentation

## 2021-03-24 LAB — BASIC METABOLIC PANEL
Anion gap: 8 (ref 5–15)
BUN: 8 mg/dL (ref 6–20)
CO2: 26 mmol/L (ref 22–32)
Calcium: 8.2 mg/dL — ABNORMAL LOW (ref 8.9–10.3)
Chloride: 105 mmol/L (ref 98–111)
Creatinine, Ser: 0.77 mg/dL (ref 0.44–1.00)
GFR, Estimated: >60 mL/min (ref >60)
Glucose, Bld: 95 mg/dL (ref 70–99)
Potassium: 3.5 mmol/L (ref 3.5–5.1)
Sodium: 139 mmol/L (ref 135–145)

## 2021-03-24 LAB — CBC WITH DIFFERENTIAL/PLATELET
Abs Immature Granulocytes: 0.03 10*3/uL (ref 0.00–0.07)
Basophils Absolute: 0.1 10*3/uL (ref 0.0–0.1)
Basophils Relative: 1 %
Eosinophils Absolute: 0.4 10*3/uL (ref 0.0–0.5)
Eosinophils Relative: 4 %
HCT: 35 % — ABNORMAL LOW (ref 36.0–46.0)
Hemoglobin: 12.6 g/dL (ref 12.0–15.0)
Immature Granulocytes: 0 %
Lymphocytes Relative: 26 %
Lymphs Abs: 2.5 10*3/uL (ref 0.7–4.0)
MCH: 32 pg (ref 26.0–34.0)
MCHC: 36 g/dL (ref 30.0–36.0)
MCV: 88.8 fL (ref 80.0–100.0)
Monocytes Absolute: 0.7 10*3/uL (ref 0.1–1.0)
Monocytes Relative: 7 %
Neutro Abs: 6.1 10*3/uL (ref 1.7–7.7)
Neutrophils Relative %: 62 %
Platelets: 242 10*3/uL (ref 150–400)
RBC: 3.94 MIL/uL (ref 3.87–5.11)
RDW: 12.2 % (ref 11.5–15.5)
WBC: 9.8 10*3/uL (ref 4.0–10.5)
nRBC: 0 % (ref 0.0–0.2)

## 2021-03-24 LAB — D-DIMER, QUANTITATIVE: D-Dimer, Quant: 0.73 ug/mL-FEU — ABNORMAL HIGH (ref 0.00–0.50)

## 2021-03-24 MED ORDER — IOHEXOL 350 MG/ML SOLN
75.0000 mL | Freq: Once | INTRAVENOUS | Status: AC | PRN
  Administered 2021-03-24: 75 mL via INTRAVENOUS

## 2021-03-24 MED ORDER — BENZONATATE 100 MG PO CAPS: 100.0000 mg | ORAL_CAPSULE | Freq: Three times a day (TID) | ORAL | 0 refills | Status: DC | PRN

## 2021-03-24 MED ORDER — PREDNISONE 20 MG PO TABS: 20.0000 mg | ORAL_TABLET | Freq: Every day | ORAL | 0 refills | Status: AC

## 2021-03-24 MED ORDER — MAGNESIUM SULFATE 2 GM/50ML IV SOLN
2.0000 g | INTRAVENOUS | Status: AC
  Administered 2021-03-24: 2 g via INTRAVENOUS
  Filled 2021-03-24: qty 50

## 2021-03-24 MED ORDER — ALBUTEROL SULFATE HFA 108 (90 BASE) MCG/ACT IN AERS: 2.0000 | INHALATION_SPRAY | RESPIRATORY_TRACT | 0 refills | Status: AC | PRN

## 2021-03-24 NOTE — ED Provider Notes (Signed)
[facility] Emergency Department Provider Note  ____________________________________________  Time seen: Approximately 2:04 PM  I have reviewed the triage vital signs and the nursing notes.   HISTORY  Chief Complaint Shortness of Breath    HPI MARYKATE HEUBERGER is a 40 y.o. female with a history of ADHD asthma anxiety fibromyalgia who comes ED complaining of pain in the right lateral chest which has been going on for the past 6 days, started after a forceful coughing fit.  She has had persistent cough which is nonproductive, no fevers or chills.  Has a sensation of shortness of breath.  Chest pain is worse with movement and deep breathing and coughing.  Cough is nonproductive.  Symptoms are constant, no alleviating factors.  Moderate intensity.  Sharp.  Denies any recent travel trauma hospitalization or surgery.  No exogenous hormone use, no smoking, no history of DVT or PE.      Past Medical History:  Diagnosis Date  . ADHD (attention deficit hyperactivity disorder) 01/09/2016  . Allergy   . Anemia   . Anxiety   . Anxiety    separation anxiety  . Asthma   . Chronic right hip pain 04/10/2016  . Collagen vascular disease ([place])   . Endometriosis   . Fibromyalgia 01/09/2016  . Headache    otc med prn  . Hypothyroidism   . Plantar fasciitis of left foot 08.29.14   PLANTAR HEEL ,FLUID AROUND MEDIAL BAND  . Polycystic ovarian disease   . Polysubstance abuse ([place]) 01/22/2018   ER visit March 2019: Drug screen is positive for marijuana, cocaine, benzodiazepines, barbiturates, and opiates  . Stress fracture 08.15.14   RIGHT   . Thoracic back pain   . Thyroid disease   . Vitamin D deficiency disease      Patient Active Problem List   Diagnosis Date Noted  . History of syncope 11/29/2020  . Closed fracture of navicular bone of foot 10/31/2020  . Sprain of right ankle 10/31/2020  . History of aspiration pneumonia 10/14/2019  . Bipolar affective, mixed  ([place]) 03/24/2019  . Anxiety 03/08/2019  . History of pancreatitis 08/20/2018  . Polysubstance abuse ([place]) 01/22/2018  . Moderate recurrent major depression ([place]) 01/21/2018  . Cocaine abuse ([place]) 01/21/2018  . Cannabis abuse 01/21/2018  . Benzodiazepine abuse ([place]) 01/21/2018  . Barbiturate abuse ([place]) 01/21/2018  . Opiate abuse, episodic ([place]) 01/21/2018  . Allergic rhinitis 02/24/2017  . Vitamin B12 deficiency 02/24/2017  . Chronic right hip pain 04/10/2016  . Controlled substance agreement signed 02/01/2016  . Obesity 02/01/2016  . ADHD (attention deficit hyperactivity disorder) 01/09/2016  . Fibromyalgia 01/09/2016  . Chronic fatigue 12/04/2015  . Thoracic back pain 10/02/2015  . Lumbar pain 10/02/2015  . Chronic tonsillitis 08/20/2015  . Migraine headache with aura 04/10/2015  . Hypothyroidism 04/10/2015  . PTSD (post-traumatic stress disorder) 04/10/2015  . Status post laparoscopic hysterectomy 01/03/2015  . Chronic female pelvic pain 11/23/2014  . Plantar fasciitis of left foot   . Stress fracture   . Asthma 12/16/2012  . Bipolar affective disorder, depressed, moderate ([place]) 12/16/2012  . Encounter for supervision of normal pregnancy in multigravida 12/14/2012     Past Surgical History:  Procedure Laterality Date  . ABDOMINAL HYSTERECTOMY  March 2016   Due to uterus being stiched into C Section incision  . APPENDECTOMY    . APPENDECTOMY  2008  . CESAREAN SECTION    . CESAREAN SECTION  2014  . LAPAROSCOPIC BILATERAL SALPINGECTOMY Bilateral 01/02/2015  Procedure: LAPAROSCOPIC BILATERAL SALPINGECTOMY;  Surgeon: Osborne Oman, MD;  Location: [facility];  Service: Gynecology;  Laterality: Bilateral;  . LAPAROSCOPIC HYSTERECTOMY N/A 01/02/2015   Procedure: HYSTERECTOMY TOTAL LAPAROSCOPIC;  Surgeon: Osborne Oman, MD;  Location: [facility];  Service: Gynecology;  Laterality: N/A;  . SHOULDER SURGERY Right   . SHOULDER SURGERY  2001   right     Prior to Admission  medications   Medication Sig Start Date End Date Taking? Authorizing Provider  albuterol (PROVENTIL HFA) 108 (90 Base) MCG/ACT inhaler Inhale 2 puffs into the lungs every 4 (four) hours as needed for wheezing or shortness of breath. 03/24/21  Yes Carrie Mew, MD  benzonatate (TESSALON PERLES) 100 MG capsule Take 1 capsule (100 mg total) by mouth 3 (three) times daily as needed for cough. 03/24/21 03/24/22 Yes Carrie Mew, MD  predniSONE (DELTASONE) 20 MG tablet Take 1 tablet (20 mg total) by mouth daily with breakfast for 7 days. 03/24/21 03/31/21 Yes Carrie Mew, MD  amphetamine-dextroamphetamine (ADDERALL) 20 MG tablet Take 20 mg by mouth 2 (two) times daily. 01/26/19   [provider]  ARMOUR THYROID 90 MG tablet TAKE 1 TABLET BY MOUTH ONCE EVERY MORNING MNO-FRIDAY AND 1/2 TABLET ON SUNDAY 03/05/21   Steele Sizer, MD  brompheniramine-pseudoephedrine-DM 30-2-10 MG/5ML syrup Take 5 mLs by mouth 4 (four) times daily as needed. 03/18/21   Sable Feil, PA-C  cariprazine (VRAYLAR) capsule Take 1 capsule by mouth daily.    [facility]  cetirizine (ZYRTEC) 10 MG tablet Take 1 tablet (10 mg total) by mouth at bedtime. 01/02/21   Steele Sizer, MD  clonazePAM (KLONOPIN) 0.5 MG tablet Take 0.5 mg by mouth 2 (two) times daily as needed for anxiety.    [facility]  doxycycline (MONODOX) 100 MG capsule Take 1 capsule (100 mg total) by mouth 2 (two) times daily. 03/18/21   Sable Feil, PA-C  ELIDEL 1 % cream APPLY TO AFFECTED AREA(s) TWICE DAILY ASDIRECTED 02/21/21   Steele Sizer, MD  fexofenadine (ALLEGRA) 180 MG tablet Take 1 tablet (180 mg total) by mouth daily. 12/05/20   Delsa Grana, PA-C  hydrOXYzine (ATARAX/VISTARIL) 10 MG tablet Take 1-2 tablets (10-20 mg total) by mouth 3 (three) times daily as needed. 12/28/20   Steele Sizer, MD  methylPREDNISolone (MEDROL DOSEPAK) 4 MG TBPK tablet Take Tapered dose as directed  03/18/21   Sable Feil, PA-C  mometasone-formoterol (DULERA) 200-5 MCG/ACT AERO Inhale 2 puffs into the lungs 2 (two) times daily. 01/02/21   Steele Sizer, MD  montelukast (SINGULAIR) 10 MG tablet Take 1 tablet (10 mg total) by mouth daily. 01/02/21   Steele Sizer, MD  PROAIR HFA 108 [phone] Base) MCG/ACT inhaler INHALE 2 PUFFS INTO THE LUNGS EVERY 6 HOURS AS NEEDED FOR WHEEZING OR SHORTNESS OF BREATH 03/05/21   Steele Sizer, MD  triamcinolone 0.1% oint-Cerave equivalent lotion 1:1 mixture Apply topically 4 (four) times daily. 12/17/20   Steele Sizer, MD  triamcinolone cream (KENALOG) 0.1 % APPLY TOPICALLY TWICE A DAY 02/21/21   Ancil Boozer, Drue Stager, MD  zolpidem (AMBIEN) 10 MG tablet Take 1 tablet by mouth daily.    [facility]     Allergies Depakote [divalproex sodium], Morphine, Pneumococcal vaccines, Prednisone, Propoxyphene, Tramadol, Tramadol, Darvocet [propoxyphene n-acetaminophen], and Valproic acid   Family History  Problem Relation Age of Onset  . Diabetes Father   . Osteoporosis Mother   . Breast cancer Mother   .  Diabetes Mother   . Cancer Maternal Grandmother        breast  . Osteoporosis Maternal Grandmother   . Breast cancer Maternal Grandmother     Social History Social History   Tobacco Use  . Smoking status: Former Smoker    Packs/day: 0.25    Years: 4.00    Pack years: 1.00    Types: Cigarettes    Quit date: 04/17/2012    Years since quitting: 8.9  . Smokeless tobacco: Never Used  Vaping Use  . Vaping Use: Never used  Substance Use Topics  . Alcohol use: No    Alcohol/week: 0.0 standard drinks  . Drug use: Not Currently    Types: Marijuana, Cocaine    Review of Systems  Constitutional:   No fever or chills.  ENT:   No sore throat. No rhinorrhea. Cardiovascular: Positive chest pain as above without syncope. Respiratory: Positive shortness of breath and cough. Gastrointestinal:   Negative for abdominal pain, vomiting  and diarrhea.  Musculoskeletal:   Negative for focal pain or swelling All other systems reviewed and are negative except as documented above in ROS and HPI.  ____________________________________________   PHYSICAL EXAM:  VITAL SIGNS: ED Triage Vitals  Enc Vitals Group     BP 03/24/21 0920 110/85     Pulse Rate 03/24/21 0920 65     Resp 03/24/21 0920 16     Temp 03/24/21 0920 97.9 F (36.6 C)     Temp Source 03/24/21 0920 Oral     SpO2 03/24/21 0920 98 %     Weight 03/24/21 0921 204 lb (92.5 kg)     Height 03/24/21 0921 5\' 8"  (1.727 m)     Head Circumference --      Peak Flow --      Pain Score 03/24/21 0920 5     Pain Loc --      Pain Edu? --      Excl. in GC? --     Vital signs reviewed, nursing assessments reviewed.   Constitutional:   Alert and oriented. Non-toxic appearance. Eyes:   Conjunctivae are normal. EOMI. PERRL. ENT      Head:   Normocephalic and atraumatic.      Nose: Normal .      Mouth/Throat: Moist mucosa      Neck:   No meningismus. Full ROM. Hematological/Lymphatic/Immunilogical:   No cervical lymphadenopathy. Cardiovascular:   RRR. Symmetric bilateral radial and DP pulses.  No murmurs. Cap refill less than 2 seconds. Respiratory:   Normal respiratory effort without tachypnea/retractions.  There is diffuse expiratory wheezing, slightly prolonged expiratory phase.  No focal crackles. Gastrointestinal:   Soft and nontender. Non distended. There is no CVA tenderness.  No rebound, rigidity, or guarding. Genitourinary:   deferred Musculoskeletal:   Normal range of motion in all extremities. No joint effusions.  No lower extremity tenderness.  No edema. Neurologic:   Normal speech and language.  Motor grossly intact. No acute focal neurologic deficits are appreciated.  Skin:    Skin is warm, dry and intact. No rash noted.  No petechiae, purpura, or bullae.  ____________________________________________    LABS (pertinent positives/negatives) (all labs  ordered are listed, but only abnormal results are displayed) Labs Reviewed  BASIC METABOLIC PANEL - Abnormal; Notable for the following components:      Result Value   Calcium 8.2 (*)    All other components within normal limits  D-DIMER, QUANTITATIVE - Abnormal; Notable for the following components:  D-Dimer, Quant 0.73 (*)    All other components within normal limits  CBC WITH DIFFERENTIAL/PLATELET - Abnormal; Notable for the following components:   HCT 35.0 (*)    All other components within normal limits   ____________________________________________   EKG  Interpreted by me Sinus rhythm rate of 55, normal axis and intervals.  Poor R wave progression.  Normal ST segments and T waves.  ____________________________________________    RADIOLOGY  DG Chest 2 View  Result Date: 03/24/2021 CLINICAL DATA:  Right side chest pain described as a deep tightness, productive cough with foamy clear and green sputum x 3 days. Pt sts cannot lay down due to getting SOB, and feels like the Right lung is paralyzed. Hx of asthma. Ex smoker x 10 years ago. EXAM: CHEST - 2 VIEW COMPARISON:  03/18/2021. FINDINGS: The heart size and mediastinal contours are within normal limits. Clear lungs. No pleural effusion or pneumothorax. Pectus excavatum. No acute osseous abnormality. IMPRESSION: No active cardiopulmonary disease. Electronically Signed   By: Lajean Manes M.D.   On: 03/24/2021 10:15   CT Angio Chest PE W and/or Wo Contrast  Result Date: 03/24/2021 CLINICAL DATA:  40 year old female with chest pain and elevated D-dimer. EXAM: CT ANGIOGRAPHY CHEST WITH CONTRAST TECHNIQUE: Multidetector CT imaging of the chest was performed using the standard protocol during bolus administration of intravenous contrast. Multiplanar CT image reconstructions and MIPs were obtained to evaluate the vascular anatomy. CONTRAST:  79mL OMNIPAQUE IOHEXOL 350 MG/ML SOLN COMPARISON:  03/24/2021 chest radiograph and prior  studies FINDINGS: Cardiovascular: This is a technically satisfactory study. No pulmonary emboli are identified. Heart size is normal. No thoracic aortic aneurysm or pericardial effusion identified. Mediastinum/Nodes: No enlarged mediastinal, hilar, or axillary lymph nodes. Thyroid gland, trachea, and esophagus demonstrate no significant findings. Lungs/Pleura: Minimal RIGHT basilar atelectasis and trace RIGHT pleural effusion noted. There is no evidence of airspace disease, consolidation, mass, nodule or pneumothorax. Upper Abdomen: No acute abnormality. Musculoskeletal: Pectus excavatum noted. No acute bony abnormality or focal bony lesions identified. Review of the MIP images confirms the above findings. IMPRESSION: 1. No evidence of pulmonary emboli or thoracic aortic aneurysm. 2. Minimal RIGHT basilar atelectasis and trace RIGHT pleural effusion. 3. Pectus excavatum. Electronically Signed   By: Margarette Canada M.D.   On: 03/24/2021 11:39    ____________________________________________   PROCEDURES Procedures  ____________________________________________  DIFFERENTIAL DIAGNOSIS   Bronchitis, asthma exacerbation, pneumonia, pneumothorax, pleural effusion, chest wall strain, pulmonary [place]  CLINICAL IMPRESSION / ASSESSMENT AND PLAN / ED COURSE  Medications ordered in the ED: Medications  magnesium sulfate IVPB 2 g 50 mL (0 g Intravenous Stopped 03/24/21 1128)  iohexol (OMNIPAQUE) 350 MG/ML injection 75 mL (75 mLs Intravenous Contrast Given 03/24/21 1108)    Pertinent labs & imaging results that were available during my care of the patient were reviewed by me and considered in my medical decision making (see chart for details).  SAMMYE STAFF was evaluated in Emergency Department on 03/24/2021 for the symptoms described in the history of present illness. She was evaluated in the context of the global COVID-19 pandemic, which necessitated consideration that the patient might be at risk for  infection with the SARS-CoV-2 virus that causes COVID-19. Institutional protocols and algorithms that pertain to the evaluation of patients at risk for COVID-19 are in a state of rapid change based on information released by regulatory bodies including the CDC and federal and state organizations. These policies and algorithms were followed during the patient's care in  the ED.   Patient presents with right chest wall pain after a coughing fit a few days ago.  No improvement after several days of doxycycline prednisone and bronchodilators.  Still has wheezing on exam.  Vital signs unremarkable.  Symptoms are noncardiac.  With no response to 6 days of outpatient treatment, labs D-dimer and chest x-ray obtained.  Chest x-ray viewed and interpreted by me and appears unremarkable.  Labs show elevated D-dimer, so CT angiogram of the chest was obtained which is negative for PE or other acute findings.  Presentation consistent with chest wall strain in the setting of acute bronchitis.  Will prescribe continued prednisone, Tessalon, albuterol.  Stable for discharge.      ____________________________________________   FINAL CLINICAL IMPRESSION(S) / ED DIAGNOSES    Final diagnoses:  Acute bronchitis, unspecified organism  Muscle strain of chest wall, initial encounter     ED Discharge Orders         Ordered    predniSONE (DELTASONE) 20 MG tablet  Daily with breakfast        03/24/21 1402    albuterol (PROVENTIL HFA) 108 (90 Base) MCG/ACT inhaler  Every 4 hours PRN        03/24/21 1402    benzonatate (TESSALON PERLES) 100 MG capsule  3 times daily PRN        03/24/21 1402          Portions of this note were generated with dragon dictation software. Dictation errors may occur despite best attempts at proofreading.   Carrie Mew, MD 03/24/21 [phone]

## 2021-03-24 NOTE — ED Notes (Signed)
Pt taken for xray

## 2021-03-24 NOTE — ED Notes (Signed)
Pt taken for CT 

## 2021-03-24 NOTE — ED Triage Notes (Signed)
Pt to ED via POV, pt states that she is having pain in her right lung area. Pt seen on Monday, was given medication for respiratory infection. Pt states that she does not feel like the medications have helped, pt states that she feels like her right lung is not moving. Pt is able to speak in complete sentences at this time and is in NAD.

## 2021-04-02 ENCOUNTER — Telehealth: Payer: Self-pay

## 2021-04-02 NOTE — Telephone Encounter (Signed)
[employer] called regarding patients Xolair.   They are having a hard time verifying insurance information. We have faxed over insurance card but they have been unable to reach patient. Her case is currently on hold.  Called patient to day and left message to return my call.  She needs to call them at [phone].

## 2021-04-17 ENCOUNTER — Other Ambulatory Visit: Payer: Self-pay

## 2021-04-17 ENCOUNTER — Ambulatory Visit: Payer: Medicaid Other | Admitting: Dermatology

## 2021-04-17 DIAGNOSIS — L509 Urticaria, unspecified: Secondary | ICD-10-CM | POA: Diagnosis not present

## 2021-04-17 NOTE — Progress Notes (Signed)
   Follow-Up Visit   Subjective  Renee Benjamin is a 40 y.o. female who presents for the following: Urticaria (Trunk, extremities, 51m f/u Allegra 180mg  1 po bid, Claritan 1 po qd, Singulair 1 pq qhs, Zantac 360 qd, xolair injections x 1, pt has not had any flares since last visit.  Pt found out they had bed bugs/roaches in walls and on curtains at trailer they were in, 2 weeks ago they moved into new place and replaced all furniture).  The following portions of the chart were reviewed this encounter and updated as appropriate:   Tobacco  Allergies  Meds  Problems  Med Hx  Surg Hx  Fam Hx      Review of Systems:  No other skin or systemic complaints except as noted in HPI or Assessment and Plan.  Objective  Well appearing patient in no apparent distress; mood and affect are within normal limits.  A focused examination was performed including face, arms. Relevant physical exam findings are noted in the Assessment and Plan.  trunk, extremities Arms clear today   Assessment & Plan  Urticaria trunk, extremities  Possibly r/t recent finding of bed bugs, pt has since moved out of previous residence and into a new residence.  Improved  Pt would like to wait on Xolair injections, she thinks Urticaria r/t the bed bugs and since moving her Urticaria has resolved  Cont Allegra 180mg  1 po bid Cont Claritan 1 po qd Cont Singulair 1 po qhs Cont Zantac 360 1 po qd  Return for prn.  I, Othelia Pulling, RMA, am acting as scribe for Sarina Ser, MD . Documentation: I have reviewed the above documentation for accuracy and completeness, and I agree with the above.  Sarina Ser, MD

## 2021-04-17 NOTE — Patient Instructions (Signed)

## 2021-04-24 ENCOUNTER — Encounter: Payer: Self-pay | Admitting: Dermatology

## 2021-05-21 NOTE — Patient Instructions (Signed)
Preventive Care 68-41 Years Old, Female Preventive care refers to lifestyle choices and visits with your health care provider that can promote health and wellness. This includes: A yearly physical exam. This is also called an annual wellness visit. Regular dental and eye exams. Immunizations. Screening for certain conditions. Healthy lifestyle choices, such as: Eating a healthy diet. Getting regular exercise. Not using drugs or products that contain nicotine and tobacco. Limiting alcohol use. What can I expect for my preventive care visit? Physical exam Your health care provider will check your: Height and weight. These may be used to calculate your BMI (body mass index). BMI is a measurement that tells if you are at a healthy weight. Heart rate and blood pressure. Body temperature. Skin for abnormal spots. Counseling Your health care provider may ask you questions about your: Past medical problems. Family's medical history. Alcohol, tobacco, and drug use. Emotional well-being. Home life and relationship well-being. Sexual activity. Diet, exercise, and sleep habits. Work and work Statistician. Access to firearms. Method of birth control. Menstrual cycle. Pregnancy history. What immunizations do I need?  Vaccines are usually given at various ages, according to a schedule. Your health care provider will recommend vaccines for you based on your age, medicalhistory, and lifestyle or other factors, such as travel or where you work. What tests do I need? Blood tests Lipid and cholesterol levels. These may be checked every 5 years, or more often if you are over 37 years old. Hepatitis C test. Hepatitis B test. Screening Lung cancer screening. You may have this screening every year starting at age 30 if you have a 30-pack-year history of smoking and currently smoke or have quit within the past 15 years. Colorectal cancer screening. All adults should have this screening starting at  age 23 and continuing until age 3. Your health care provider may recommend screening at age 88 if you are at increased risk. You will have tests every 1-10 years, depending on your results and the type of screening test. Diabetes screening. This is done by checking your blood sugar (glucose) after you have not eaten for a while (fasting). You may have this done every 1-3 years. Mammogram. This may be done every 1-2 years. Talk with your health care provider about when you should start having regular mammograms. This may depend on whether you have a family history of breast cancer. BRCA-related cancer screening. This may be done if you have a family history of breast, ovarian, tubal, or peritoneal cancers. Pelvic exam and Pap test. This may be done every 3 years starting at age 79. Starting at age 54, this may be done every 5 years if you have a Pap test in combination with an HPV test. Other tests STD (sexually transmitted disease) testing, if you are at risk. Bone density scan. This is done to screen for osteoporosis. You may have this scan if you are at high risk for osteoporosis. Talk with your health care provider about your test results, treatment options,and if necessary, the need for more tests. Follow these instructions at home: Eating and drinking  Eat a diet that includes fresh fruits and vegetables, whole grains, lean protein, and low-fat dairy products. Take vitamin and mineral supplements as recommended by your health care provider. Do not drink alcohol if: Your health care provider tells you not to drink. You are pregnant, may be pregnant, or are planning to become pregnant. If you drink alcohol: Limit how much you have to 0-1 drink a day. Be aware  of how much alcohol is in your drink. In the [place] one drink equals one 12 oz bottle of beer (355 mL), one 5 oz glass of wine (148 mL), or one 1 oz glass of hard liquor (44 mL).  Lifestyle Take daily care of your teeth and  gums. Brush your teeth every morning and night with fluoride toothpaste. Floss one time each day. Stay active. Exercise for at least 30 minutes 5 or more days each week. Do not use any products that contain nicotine or tobacco, such as cigarettes, e-cigarettes, and chewing tobacco. If you need help quitting, ask your health care provider. Do not use drugs. If you are sexually active, practice safe sex. Use a condom or other form of protection to prevent STIs (sexually transmitted infections). If you do not wish to become pregnant, use a form of birth control. If you plan to become pregnant, see your health care provider for a prepregnancy visit. If told by your health care provider, take low-dose aspirin daily starting at age 29. Find healthy ways to cope with stress, such as: Meditation, yoga, or listening to music. Journaling. Talking to a trusted person. Spending time with friends and family. Safety Always wear your seat belt while driving or riding in a vehicle. Do not drive: If you have been drinking alcohol. Do not ride with someone who has been drinking. When you are tired or distracted. While texting. Wear a helmet and other protective equipment during sports activities. If you have firearms in your house, make sure you follow all gun safety procedures. What's next? Visit your health care provider once a year for an annual wellness visit. Ask your health care provider how often you should have your eyes and teeth checked. Stay up to date on all vaccines. This information is not intended to replace advice given to you by your health care provider. Make sure you discuss any questions you have with your healthcare provider. Document Revised: 07/24/2020 Document Reviewed: 07/01/2018 Elsevier Patient Education  2022 [employer].

## 2021-05-21 NOTE — Progress Notes (Signed)
Name: Renee Benjamin   MRN: [id number]    DOB: May 28, 1981   Date:05/22/2021       Progress Note  Subjective  Chief Complaint  Annual Exam  HPI  Patient presents for annual CPE.  Diet:she has left jaw pain, seen by dentist and diagnosed with tooth abscess and given antibiotics, on a soft diet  Exercise: continue regular physical activity    Friendly Office Visit from 05/22/2021 in [facility]  AUDIT-C Score 0      Depression: Phq 9 is  positive Depression screen [facility] 2/9 05/22/2021 01/02/2021 12/05/2020 11/29/2020 08/23/2020  Decreased Interest 0 1 0 0 0  Down, Depressed, Hopeless 3 1 0 0 0  PHQ - 2 Score 3 2 0 0 0  Altered sleeping 0 0 - - 0  Tired, decreased energy 0 0 - - 0  Change in appetite 2 0 - - 0  Feeling bad or failure about yourself  0 0 - - 0  Trouble concentrating 0 0 - - 0  Moving slowly or fidgety/restless 0 0 - - 0  Suicidal thoughts 0 0 - - 0  PHQ-9 Score 5 2 - - 0  Difficult doing work/chores - - - - -  Some recent data might be hidden   Hypertension: BP Readings from Last 3 Encounters:  05/22/21 124/82  03/24/21 115/83  03/18/21 110/72   Obesity: Wt Readings from Last 3 Encounters:  05/22/21 203 lb (92.1 kg)  03/24/21 204 lb (92.5 kg)  03/18/21 207 lb 6.4 oz (94.1 kg)   BMI Readings from Last 3 Encounters:  05/22/21 30.87 kg/m  03/24/21 31.02 kg/m  03/18/21 31.54 kg/m     Vaccines:   HPV: she thinks she already had it  Pneumonia: up to date  Flu: educated and discussed with patient.  Hep C Screening: 04/09/20 STD testing and prevention (HIV/chl/gon/syphilis): 04/10/15 Intimate partner violence:negative Sexual History :same partner for 11 years , no pain  Menstrual History/LMP/Abnormal Bleeding: s/p hysterectomy  Incontinence Symptoms: no problems   Breast cancer:  - Last Mammogram: we will order today  - BRCA gene screening: N/A  Osteoporosis: Discussed high calcium and vitamin D supplementation, weight  bearing exercises  Cervical cancer screening: N/A  Skin cancer: Discussed monitoring for atypical lesions  Colorectal cancer: N/A   Lung cancer:  Low Dose CT Chest recommended if Age 18-80 years, 20 pack-year currently smoking OR have quit w/in 15years. Patient does not qualify.   ECG: 03/24/21  Advanced Care Planning: A voluntary discussion about advance care planning including the explanation and discussion of advance directives.  Discussed health care proxy and Living will, and the patient was able to identify a health care proxy as mother   Lipids: Lab Results  Component Value Date   CHOL 160 04/09/2020   CHOL 199 04/10/2015   Lab Results  Component Value Date   HDL 36 (L) 04/09/2020   HDL 52 04/10/2015   Lab Results  Component Value Date   LDLCALC 107 (H) 04/09/2020   LDLCALC 127 (H) 04/10/2015   Lab Results  Component Value Date   TRIG 82 04/09/2020   TRIG 101 04/10/2015   Lab Results  Component Value Date   CHOLHDL 4.4 04/09/2020   CHOLHDL 3.8 04/10/2015   No results found for: LDLDIRECT  Glucose: Glucose  Date Value Ref Range Status  03/16/2014 88 65 - 99 mg/dL Final  05/12/2013 95 65 - 99 mg/dL Final  09/02/2012 81 65 - 99  mg/dL Final   Glucose, Bld  Date Value Ref Range Status  03/24/2021 95 70 - 99 mg/dL Final    Comment:    Glucose reference range applies only to samples taken after fasting for at least 8 hours.  12/25/2020 92 65 - 99 mg/dL Final    Comment:    .            Fasting reference interval .   08/30/2020 117 (H) 70 - 99 mg/dL Final    Comment:    Glucose reference range applies only to samples taken after fasting for at least 8 hours.    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[place]) 01/21/2018   Opiate abuse, episodic ([place]) 01/21/2018   Allergic rhinitis 02/24/2017   Vitamin B12 deficiency 02/24/2017   Chronic right hip pain 04/10/2016   Controlled substance agreement signed 02/01/2016   Obesity 02/01/2016   ADHD (attention deficit hyperactivity disorder) 01/09/2016   Fibromyalgia 01/09/2016   Chronic fatigue 12/04/2015   Thoracic back pain 10/02/2015   Lumbar pain 10/02/2015   Chronic tonsillitis 08/20/2015   Migraine headache with aura 04/10/2015   Hypothyroidism 04/10/2015   PTSD (post-traumatic stress disorder) 04/10/2015   Status post laparoscopic hysterectomy 01/03/2015   Chronic female pelvic pain 11/23/2014   Plantar fasciitis of left foot    Stress fracture    Asthma 12/16/2012    Past Surgical History:  Procedure Laterality Date   ABDOMINAL HYSTERECTOMY  March 2016   Due to uterus being stiched into C Section incision   APPENDECTOMY     APPENDECTOMY  2008   CESAREAN SECTION     CESAREAN SECTION  2014   LAPAROSCOPIC BILATERAL SALPINGECTOMY Bilateral 01/02/2015   Procedure: LAPAROSCOPIC BILATERAL SALPINGECTOMY;  Surgeon: Osborne Oman, MD;  Location: [facility];  Service: Gynecology;  Laterality: Bilateral;   LAPAROSCOPIC HYSTERECTOMY N/A 01/02/2015   Procedure: HYSTERECTOMY TOTAL LAPAROSCOPIC;  Surgeon: Osborne Oman, MD;  Location: [facility];  Service: Gynecology;  Laterality: N/A;   SHOULDER SURGERY Right    SHOULDER SURGERY  2001   right    Family History  Problem Relation Age of Onset   Diabetes Father    Osteoporosis Mother    Breast cancer Mother    Diabetes Mother    Cancer Maternal Grandmother        breast   Osteoporosis Maternal Grandmother    Breast cancer Maternal Grandmother      Social History   Socioeconomic History   Marital status: Single    Spouse name: Not on file   Number of children: 1   Years of education: Not on file   Highest education level: GED or equivalent  Occupational History   Not on file  Tobacco Use   Smoking status: Former    Packs/day: 0.25    Years: 4.00    Pack years: 1.00    Types: Cigarettes    Quit date: 04/17/2012    Years since quitting: 9.1   Smokeless tobacco: Never  Vaping Use   Vaping Use: Never used  Substance and Sexual Activity   Alcohol use: No    Alcohol/week: 0.0 standard drinks   Drug use: Not Currently    Types: Marijuana, Cocaine   Sexual activity: Yes    Partners: Male    Birth control/protection: None  Other Topics Concern   Not on file  Social History Narrative   Lives with boyfriend and son   Works in [employer] Strain: Low Risk    Difficulty of Paying Living Expenses: Not hard at all  Food Insecurity: No Food Insecurity   Worried About Charity fundraiser in the Last Year: Never true   Arboriculturist in the Last Year: Never true  Transportation Needs: No Transportation Needs   Lack of Transportation (Medical): No   Lack of Transportation (Non-Medical): No  Physical Activity: Sufficiently Active   Days of Exercise per Week: 7 days   Minutes of Exercise per Session: 30 min  Stress: No Stress Concern Present   Feeling of Stress : Not at all  Social Connections: Moderately Isolated   Frequency of Communication with Friends and Family: More than three times a week   Frequency of Social Gatherings with Friends and Family: Once a week   Attends Religious Services: Never   Marine scientist or Organizations: No   Attends Music therapist: Never   Marital Status: Living with partner  Intimate Partner Violence: Not At Risk   Fear of Current or Ex-Partner: No   Emotionally Abused: No   Physically Abused: No   Sexually  Abused: No     Current Outpatient Medications:    albuterol (PROVENTIL HFA) 108 (90 Base) MCG/ACT inhaler, Inhale 2 puffs into the lungs every 4 (four) hours as needed for wheezing or shortness of breath., Disp: 1 each, Rfl: 0   amphetamine-dextroamphetamine (ADDERALL) 20 MG tablet, Take 20 mg by mouth 2 (two) times daily., Disp: , Rfl:    ARMOUR THYROID 90 MG tablet, TAKE 1 TABLET BY MOUTH ONCE EVERY MORNING MNO-FRIDAY AND 1/2 TABLET ON SUNDAY, Disp: 30 tablet, Rfl: 1   clonazePAM (KLONOPIN) 0.5 MG tablet, Take 0.5 mg by mouth 2 (two) times daily as needed for anxiety., Disp: , Rfl:    lumateperone tosylate (CAPLYTA) 42 MG capsule, Take 42 mg by mouth daily., Disp: , Rfl:    zolpidem (AMBIEN) 10 MG tablet, Take 1 tablet by mouth daily., Disp: , Rfl:   Allergies  Allergen Reactions   Depakote [Divalproex Sodium] Hives   Morphine Hives   Pneumococcal Vaccines    Prednisone     Possibly mania   Propoxyphene Other (See Comments)   Tramadol Nausea Only   Tramadol Nausea And Vomiting and Other (See Comments)   Darvocet [Propoxyphene N-Acetaminophen] Rash   Valproic Acid Rash     ROS  Constitutional: Negative for fever or weight change.  Respiratory: Negative for cough and shortness of breath.   Cardiovascular: Negative for chest pain or palpitations.  Gastrointestinal: Negative for abdominal pain, no bowel changes.  Musculoskeletal: Negative for gait problem or joint swelling.  Skin: positive  for intermittent rash.  Neurological: Negative for dizziness or headache.  No other specific complaints in a complete review of systems (except as listed in HPI above).   Objective  Vitals:   05/22/21 0819  BP: 124/82  Pulse: 86  Resp: 16  Temp: 97.6  F (36.4 C)  TempSrc: Oral  SpO2: 98%  Weight: 203 lb (92.1 kg)  Height: _0  (1.727 m)    Body mass index is 30.87 kg/m.  Physical Exam  Constitutional: Patient appears well-developed and well-nourished. No distress.  HENT:  Head: Normocephalic and atraumatic. Ears: B TMs ok, no erythema or effusion; Nose: Nose normal. Mouth/Throat: upper dentures, missing teeth on bottom, tender on 2nd molar lower. Difficulty opening jaw, pain over left TMJ Eyes: Conjunctivae and EOM are normal. Pupils are equal, round, and reactive to light. No scleral icterus.  Neck: Normal range of motion. Neck supple. No JVD present. No thyromegaly present.  Cardiovascular: Normal rate, regular rhythm and normal heart sounds.  No murmur heard. No BLE edema. Pulmonary/Chest: Effort normal and breath sounds normal. No respiratory distress. Abdominal: Soft. Bowel sounds are normal, no distension. There is no tenderness. no masses Breast: lumpy inner quadrants both breasts,  no nipple discharge or rashes FEMALE GENITALIA:  Not done  RECTAL: not done  Musculoskeletal: Normal range of motion, no joint effusions. No gross deformities Neurological: he is alert and oriented to person, place, and time. No cranial nerve deficit. Coordination, balance, strength, speech and gait are normal.  Skin: Skin is warm and dry. No rash noted. No erythema.  Psychiatric: Patient has a normal mood and affect. behavior is normal. Judgment and thought content normal.   Recent Results (from the past 2160 hour(s))  Basic metabolic panel     Status: Abnormal   Collection Time: 03/24/21 10:15 AM  Result Value Ref Range   Sodium 139 135 - 145 mmol/L   Potassium 3.5 3.5 - 5.1 mmol/L   Chloride 105 98 - 111 mmol/L   CO2 26 22 - 32 mmol/L   Glucose, Bld 95 70 - 99 mg/dL    Comment: Glucose reference range applies only to samples taken after fasting for at least 8 hours.   BUN 8 6 - 20 mg/dL   Creatinine, Ser 0.77 0.44 - 1.00 mg/dL   Calcium 8.2 (L) 8.9 - 10.3 mg/dL   GFR, Estimated >60 >60 mL/min    Comment: (NOTE) Calculated using the CKD-EPI Creatinine Equation (2021)    Anion gap 8 5 - 15    Comment: Performed at [facility], [address]  [place], [place] [address]  D-dimer, quantitative     Status: Abnormal   Collection Time: 03/24/21 10:15 AM  Result Value Ref Range   D-Dimer, Quant 0.73 (H) 0.00 - 0.50 ug/mL-FEU    Comment: (NOTE) At the manufacturer cut-off value of 0.5 g/mL FEU, this assay has a negative predictive value of 95-100%.This assay is intended for use in conjunction with a clinical pretest probability (PTP) assessment model to exclude pulmonary embolism (PE) and deep venous thrombosis (DVT) in outpatients suspected of PE or DVT. Results should be correlated with clinical presentation. Performed at [facility], [address] [place], [place] [address]   CBC with Differential     Status: Abnormal   Collection Time: 03/24/21 10:15 AM  Result Value Ref Range   WBC 9.8 4.0 - 10.5 K/uL   RBC 3.94 3.87 - 5.11 MIL/uL   Hemoglobin 12.6 12.0 - 15.0 g/dL   HCT 35.0 (L) 36.0 - 46.0 %   MCV 88.8 80.0 - 100.0 fL   MCH 32.0 26.0 - 34.0 pg   MCHC 36.0 30.0 - 36.0 g/dL   RDW 12.2 11.5 - 15.5 %   Platelets 242 150 - 400 K/uL  nRBC 0.0 0.0 - 0.2 %   Neutrophils Relative % 62 %   Neutro Abs 6.1 1.7 - 7.7 K/uL   Lymphocytes Relative 26 %   Lymphs Abs 2.5 0.7 - 4.0 K/uL   Monocytes Relative 7 %   Monocytes Absolute 0.7 0.1 - 1.0 K/uL   Eosinophils Relative 4 %   Eosinophils Absolute 0.4 0.0 - 0.5 K/uL   Basophils Relative 1 %   Basophils Absolute 0.1 0.0 - 0.1 K/uL   Immature Granulocytes 0 %   Abs Immature Granulocytes 0.03 0.00 - 0.07 K/uL    Comment: Performed at [facility], [address] [place], [place] [address]     Fall Risk: Fall Risk  05/22/2021 01/02/2021 12/05/2020 11/29/2020 08/23/2020  Falls in the past year? 0 1 1 0 0  Number falls in past yr: 0 0 0 0 0  Injury with Fall? 0 1 1 0 0  Risk for fall due to : - - History of fall(s) - -  Follow up - - Falls evaluation completed - -     Functional Status Survey: Is the patient deaf or have difficulty hearing?: No Does the  patient have difficulty seeing, even when wearing glasses/contacts?: Yes Does the patient have difficulty concentrating, remembering, or making decisions?: No Does the patient have difficulty walking or climbing stairs?: No Does the patient have difficulty dressing or bathing?: No Does the patient have difficulty doing errands alone such as visiting a doctor's office or shopping?: No   Assessment & Plan  1. Well adult exam  - Lipid panel - TSH - VITAMIN D 25 Hydroxy (Vit-D Deficiency, Fractures) - Tdap vaccine greater than or equal to 7yo IM  2. Dyslipidemia  - Lipid panel  3. Vitamin D deficiency  - VITAMIN D 25 Hydroxy (Vit-D Deficiency, Fractures)  4. Adult hypothyroidism  - TSH  5. Need for Tdap vaccination  - Tdap vaccine greater than or equal to 7yo IM  6. Breast cancer screening by mammogram  - MM 3D SCREEN BREAST BILATERAL; Future   -USPSTF grade A and B recommendations reviewed with patient; age-appropriate recommendations, preventive care, screening tests, etc discussed and encouraged; healthy living encouraged; see AVS for patient education given to patient -Discussed importance of 150 minutes of physical activity weekly, eat two servings of fish weekly, eat one serving of tree nuts ( cashews, pistachios, pecans, almonds.Marland Kitchen) every other day, eat 6 servings of fruit/vegetables daily and drink plenty of water and avoid sweet beverages.

## 2021-05-22 ENCOUNTER — Ambulatory Visit (INDEPENDENT_AMBULATORY_CARE_PROVIDER_SITE_OTHER): Payer: Medicaid Other | Admitting: Family Medicine

## 2021-05-22 ENCOUNTER — Encounter: Payer: Self-pay | Admitting: Family Medicine

## 2021-05-22 ENCOUNTER — Other Ambulatory Visit: Payer: Self-pay

## 2021-05-22 VITALS — BP 124/82 | HR 86 | Temp 97.6°F | Resp 16 | Ht 68.0 in | Wt 203.0 lb

## 2021-05-22 DIAGNOSIS — E039 Hypothyroidism, unspecified: Secondary | ICD-10-CM

## 2021-05-22 DIAGNOSIS — E785 Hyperlipidemia, unspecified: Secondary | ICD-10-CM

## 2021-05-22 DIAGNOSIS — Z1231 Encounter for screening mammogram for malignant neoplasm of breast: Secondary | ICD-10-CM

## 2021-05-22 DIAGNOSIS — E559 Vitamin D deficiency, unspecified: Secondary | ICD-10-CM | POA: Diagnosis not present

## 2021-05-22 DIAGNOSIS — Z23 Encounter for immunization: Secondary | ICD-10-CM | POA: Diagnosis not present

## 2021-05-22 DIAGNOSIS — Z0000 Encounter for general adult medical examination without abnormal findings: Secondary | ICD-10-CM

## 2021-05-23 ENCOUNTER — Other Ambulatory Visit: Payer: Self-pay | Admitting: Family Medicine

## 2021-05-23 DIAGNOSIS — E039 Hypothyroidism, unspecified: Secondary | ICD-10-CM

## 2021-05-23 LAB — LIPID PANEL
Cholesterol: 141 mg/dL (ref <200)
HDL: 31 mg/dL — ABNORMAL LOW (ref >=50)
LDL Cholesterol (Calc): 90 mg/dL (calc)
Non-HDL Cholesterol (Calc): 110 mg/dL (calc) (ref <130)
Total CHOL/HDL Ratio: 4.5 (calc) (ref <5.0)
Triglycerides: 104 mg/dL (ref <150)

## 2021-05-23 LAB — VITAMIN D 25 HYDROXY (VIT D DEFICIENCY, FRACTURES): Vit D, 25-Hydroxy: 33 ng/mL (ref 30–100)

## 2021-05-23 LAB — TSH: TSH: 10.96 mIU/L — ABNORMAL HIGH

## 2021-05-23 MED ORDER — ARMOUR THYROID 90 MG PO TABS: 90.0000 mg | ORAL_TABLET | Freq: Every day | ORAL | 1 refills | Status: DC

## 2021-06-04 ENCOUNTER — Telehealth: Payer: Self-pay

## 2021-06-04 NOTE — Telephone Encounter (Signed)
Tried calling pt. No answer no vm

## 2021-06-04 NOTE — Telephone Encounter (Signed)
Copied from [facility] [id number]. Topic: General - Other >> Jun 04, 2021  1:46 PM Tessa Lerner A wrote: Reason for CRM: Patient has called to request for their PCP to send orders for a diagnostic mammogram to [facility]  Please contact further if needed

## 2021-06-20 ENCOUNTER — Encounter: Payer: Self-pay | Admitting: Family Medicine

## 2021-07-16 ENCOUNTER — Encounter: Payer: Self-pay | Admitting: Family Medicine

## 2021-07-16 ENCOUNTER — Other Ambulatory Visit: Payer: Self-pay

## 2021-07-16 DIAGNOSIS — Z803 Family history of malignant neoplasm of breast: Secondary | ICD-10-CM

## 2021-07-16 DIAGNOSIS — Z1231 Encounter for screening mammogram for malignant neoplasm of breast: Secondary | ICD-10-CM

## 2021-07-16 NOTE — Telephone Encounter (Signed)
Completed.

## 2021-08-05 ENCOUNTER — Other Ambulatory Visit: Payer: Self-pay

## 2021-08-05 ENCOUNTER — Ambulatory Visit
Admission: RE | Admit: 2021-08-05 | Discharge: 2021-08-05 | Disposition: A | Discharge status: Home / Self Care | Payer: Medicaid Other | Source: Ambulatory Visit | Attending: Family Medicine | Admitting: Family Medicine

## 2021-08-05 DIAGNOSIS — Z1231 Encounter for screening mammogram for malignant neoplasm of breast: Secondary | ICD-10-CM | POA: Diagnosis not present

## 2021-08-22 NOTE — Progress Notes (Signed)
Name: Renee Benjamin   MRN: [id number]    DOB: 03-25-1981   Date:08/23/2021       Progress Note  Subjective  Chief Complaint  Follow Up  HPI  FMS: long history of FMS, she states she has been stable around 7/10. Unable to tolerate gabapentin or Lyrica because it causes sedation. She stays active at work.    Bipolar mixed: she was  under the care [facility] in [place] but insurance stopped covering her visits there and she is now back at [facility] - she states stress is much higher now, lost her maternal grandmother 07/07/2023, maternal grandmother died 08-13-2023 th from a broken heart and her 58 yo dog was bitten by a copper head  last week when she was supposed to put him down on Monday but her employer did not allow her to take time off, she is super stressed watching him die, she has noticed bowel and bladder incontinence at night since Monday, she is feeling super stressed, she is crying, she is going to take him to vet tomorrow to put him down.  She is now on Caplyta and Ambien at night to sleep.   Lower abdominal pain / Bowel incontinence: lower abdominal pain is with meals, no change in bowel movement or blood in stools during the day but has bowel incontinence / loose stools during the night since this past Monday - she is under a lot of stress. Not eating much, feeling tired   Chronic pain: she was seen by neurosurgeon and also seen by pain clinic but decided not to go back since she has a history of drug abuse and was denied therapy. She did not want to get steroid injections.  She states pain usually bothersome when she seats down on a hard floor , but doing better. Not taking medications at this time. She fell a few weeks ago and went to [facility] and had x-ray of her left foot that was negative for fracture, pain on the foot resolved, but has noticed increase in right buttocks pain and with rom of right leg. Feels stiff and aching    Asthma: she was seen by pulmonologist in the past . She  states she has a nocturnal cough , some sob at night, she states over the past week she has noticed a productive cough, green sputum and feels congestions. She is only using rescue inhaler. She states one of the combo medication caused mouth sore, we will try a different type   History of bed begs: it was the reason of her recurrent rash, she moved and is doing well.    Hypothyroidism: she takes levothyroxine 90 mg Armour thyroid daily and denies hair loss, she is always tired ( unchanged) she denies constipation, last TSH was up , we will recheck it today   Dyslipidemia: discussed life style modification, eat more fish, tree nuts and increase physical activity. 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   Barbiturate abuse (  [place]) 01/21/2018   Opiate abuse, episodic ([place]) 01/21/2018   Allergic rhinitis 02/24/2017   Vitamin B12 deficiency 02/24/2017   Chronic right hip pain 04/10/2016   Controlled substance agreement signed 02/01/2016   Obesity 02/01/2016   ADHD (attention deficit hyperactivity disorder) 01/09/2016   Fibromyalgia 01/09/2016   Chronic fatigue 12/04/2015   Thoracic back pain 10/02/2015   Lumbar pain 10/02/2015   Chronic tonsillitis 08/20/2015   Migraine headache with aura 04/10/2015   Hypothyroidism 04/10/2015   PTSD (post-traumatic stress disorder) 04/10/2015   Status post laparoscopic hysterectomy 01/03/2015   Chronic female pelvic pain 11/23/2014   Plantar fasciitis of left  foot    Stress fracture    Asthma 12/16/2012    Past Surgical History:  Procedure Laterality Date   ABDOMINAL HYSTERECTOMY  March 2016   Due to uterus being stiched into C Section incision   APPENDECTOMY     APPENDECTOMY  2008   CESAREAN SECTION     CESAREAN SECTION  2014   LAPAROSCOPIC BILATERAL SALPINGECTOMY Bilateral 01/02/2015   Procedure: LAPAROSCOPIC BILATERAL SALPINGECTOMY;  Surgeon: Osborne Oman, MD;  Location: [facility];  Service: Gynecology;  Laterality: Bilateral;   LAPAROSCOPIC HYSTERECTOMY N/A 01/02/2015   Procedure: HYSTERECTOMY TOTAL LAPAROSCOPIC;  Surgeon: Osborne Oman, MD;  Location: [facility];  Service: Gynecology;  Laterality: N/A;   SHOULDER SURGERY Right    SHOULDER SURGERY  2001   right    Family History  Problem Relation Age of Onset   Osteoporosis Mother    Diabetes Mother    Lupus Mother    Diabetes Father    Osteoporosis Maternal Grandmother    Breast cancer Maternal Grandmother 30       and 2nd time 16   Ovarian cancer Maternal Grandmother     Social History   Tobacco Use   Smoking status: Former    Packs/day: 0.25    Years: 4.00    Pack years: 1.00    Types: Cigarettes    Quit date: 04/17/2012    Years since quitting: 9.3   Smokeless tobacco: Never  Substance Use Topics   Alcohol use: No    Alcohol/week: 0.0 standard drinks     Current Outpatient Medications:    albuterol (PROVENTIL HFA) 108 (90 Base) MCG/ACT inhaler, Inhale 2 puffs into the lungs every 4 (four) hours as needed for wheezing or shortness of breath., Disp: 1 each, Rfl: 0   amphetamine-dextroamphetamine (ADDERALL) 20 MG tablet, Take 20 mg by mouth 2 (two) times daily., Disp: , Rfl:    ARMOUR THYROID 90 MG tablet, Take 1 tablet (90 mg total) by mouth daily., Disp: 30 tablet, Rfl: 1   clonazePAM (KLONOPIN) 0.5 MG tablet, Take 0.5 mg by mouth 2 (two) times daily as needed for anxiety., Disp: , Rfl:    lumateperone tosylate (CAPLYTA) 42 MG capsule, Take 42 mg by mouth daily.,  Disp: , Rfl:    zolpidem (AMBIEN) 10 MG tablet, Take 1 tablet by mouth daily., Disp: , Rfl:   Allergies  Allergen Reactions   Depakote [Divalproex Sodium] Hives   Morphine Hives   Pneumococcal Vaccines    Prednisone     Possibly mania   Propoxyphene Other (See Comments)   Tramadol Nausea Only   Tramadol Nausea And Vomiting and Other (See Comments)   Darvocet [Propoxyphene N-Acetaminophen] Rash   Valproic Acid Rash    I personally reviewed active problem list, medication list, allergies, family history, social history, health maintenance with the patient/caregiver today.  ROS  Constitutional: Negative for fever or weight change.  Respiratory: positive for cough and shortness of breath.   Cardiovascular: Negative for chest pain or palpitations.  Gastrointestinal: Negative for abdominal pain, no bowel changes.  Musculoskeletal: Negative for gait problem or joint swelling.  Skin: Negative for rash.  Neurological: Negative for dizziness or headache.  No other specific complaints in a complete review of systems (except as listed in HPI above).   Objective  Vitals:   08/23/21 0821  BP: 106/64  Pulse: 90  Resp: 16  Temp: 97.8 F (36.6 C)  TempSrc: Oral  SpO2: 99%  Weight: 195 lb 3.2 oz (88.5 kg)  Height: 5\' 8"  (1.727 m)    Body mass index is 29.68 kg/m.  Physical Exam  Constitutional: Patient appears well-developed and well-nourished. Overweight.  No distress.  HEENT: head atraumatic, normocephalic, pupils equal and reactive to light, neck supple Cardiovascular: Normal rate, regular rhythm and normal heart sounds.  No murmur heard. No BLE edema. Pulmonary/Chest: Effort normal and breath sounds normal. No respiratory distress. Abdominal: Soft.  There is mild lower abdominal  tenderness. Muscular skeletal: pain all over, worse with rom of right hip and palpation of right lumbar paraspinal muscles  Psychiatric: Patient has a depressed mood/crying  behavior is normal.  Judgment and thought content normal.    PHQ2/9: Depression screen [facility] 2/9 08/23/2021 05/22/2021 01/02/2021 12/05/2020 11/29/2020  Decreased Interest 3 0 1 0 0  Down, Depressed, Hopeless 3 3 1  0 0  PHQ - 2 Score 6 3 2  0 0  Altered sleeping 0 0 0 - -  Tired, decreased energy 3 0 0 - -  Change in appetite 3 2 0 - -  Feeling bad or failure about yourself  0 0 0 - -  Trouble concentrating 1 0 0 - -  Moving slowly or fidgety/restless 0 0 0 - -  Suicidal thoughts 0 0 0 - -  PHQ-9 Score 13 5 2  - -  Difficult doing work/chores Somewhat difficult - - - -  Some recent data might be hidden    phq 9 is positive   Fall Risk: Fall Risk  08/23/2021 05/22/2021 01/02/2021 12/05/2020 11/29/2020  Falls in the past year? 1 0 1 1 0  Number falls in past yr: 1 0 0 0 0  Injury with Fall? 1 0 1 1 0  Risk for fall due to : Impaired balance/gait - - History of fall(s) -  Follow up Falls prevention discussed - - Falls evaluation completed -      Functional Status Survey: Is the patient deaf or have difficulty hearing?: No Does the patient have difficulty seeing, even when wearing glasses/contacts?: No Does the patient have difficulty concentrating, remembering, or making decisions?: No Does the patient have difficulty walking or climbing stairs?: No Does the patient have difficulty dressing or bathing?: No Does the patient have difficulty doing errands alone such as visiting a doctor's office or shopping?: No    Assessment & Plan  1. Adult hypothyroidism  - TSH - ARMOUR THYROID 90 MG tablet; Take 1 tablet (90 mg total) by mouth daily.  Dispense: 30 tablet; Refill: 0  2. Dyslipidemia   3. Asthma, moderate persistent, poorly-controlled  - fluticasone-salmeterol (ADVAIR) 100-50 MCG/ACT AEPB; Inhale 1 puff into the lungs 2 (two) times daily.  Dispense: 1 each; Refill: 2  4. Bipolar affective, mixed ([place])   5. Chronic bilateral low back pain with right-sided sciatica  - meloxicam (MOBIC) 15 MG  tablet; Take  1 tablet (15 mg total) by mouth daily as needed for pain.  Dispense: 90 tablet; Refill: 0  6. Vitamin D deficiency   7. Fibromyalgia  - meloxicam (MOBIC) 15 MG tablet; Take 1 tablet (15 mg total) by mouth daily as needed for pain.  Dispense: 90 tablet; Refill: 0    8. Lower abdominal pain  - Ambulatory referral to Gastroenterology - COMPLETE METABOLIC PANEL WITH GFR - CBC with Differential/Platelet - Sedimentation rate - C-reactive protein - Gastrointestinal Pathogen Panel PCR  9. Urinary and bowel incontinence  - Ambulatory referral to Gastroenterology - CULTURE, URINE COMPREHENSIVE - COMPLETE METABOLIC PANEL WITH GFR - CBC with Differential/Platelet - Sedimentation rate - C-reactive protein - Gastrointestinal Pathogen Panel PCR  10. Weight loss  - Sedimentation rate - C-reactive protein - Gastrointestinal Pathogen Panel PCR

## 2021-08-23 ENCOUNTER — Encounter: Payer: Self-pay | Admitting: Family Medicine

## 2021-08-23 ENCOUNTER — Ambulatory Visit: Payer: Medicaid Other | Admitting: Family Medicine

## 2021-08-23 ENCOUNTER — Other Ambulatory Visit: Payer: Self-pay

## 2021-08-23 VITALS — BP 106/64 | HR 90 | Temp 97.8°F | Resp 16 | Ht 68.0 in | Wt 195.2 lb

## 2021-08-23 DIAGNOSIS — M5441 Lumbago with sciatica, right side: Secondary | ICD-10-CM

## 2021-08-23 DIAGNOSIS — E039 Hypothyroidism, unspecified: Secondary | ICD-10-CM

## 2021-08-23 DIAGNOSIS — J4540 Moderate persistent asthma, uncomplicated: Secondary | ICD-10-CM | POA: Diagnosis not present

## 2021-08-23 DIAGNOSIS — L501 Idiopathic urticaria: Secondary | ICD-10-CM | POA: Insufficient documentation

## 2021-08-23 DIAGNOSIS — E559 Vitamin D deficiency, unspecified: Secondary | ICD-10-CM

## 2021-08-23 DIAGNOSIS — R32 Unspecified urinary incontinence: Secondary | ICD-10-CM

## 2021-08-23 DIAGNOSIS — G8929 Other chronic pain: Secondary | ICD-10-CM

## 2021-08-23 DIAGNOSIS — F3160 Bipolar disorder, current episode mixed, unspecified: Secondary | ICD-10-CM | POA: Diagnosis not present

## 2021-08-23 DIAGNOSIS — E785 Hyperlipidemia, unspecified: Secondary | ICD-10-CM | POA: Diagnosis not present

## 2021-08-23 DIAGNOSIS — H1045 Other chronic allergic conjunctivitis: Secondary | ICD-10-CM | POA: Insufficient documentation

## 2021-08-23 DIAGNOSIS — R1030 Lower abdominal pain, unspecified: Secondary | ICD-10-CM

## 2021-08-23 DIAGNOSIS — M797 Fibromyalgia: Secondary | ICD-10-CM

## 2021-08-23 DIAGNOSIS — R159 Full incontinence of feces: Secondary | ICD-10-CM

## 2021-08-23 DIAGNOSIS — R634 Abnormal weight loss: Secondary | ICD-10-CM

## 2021-08-23 MED ORDER — FLUTICASONE-SALMETEROL 100-50 MCG/ACT IN AEPB: 1.0000 | INHALATION_SPRAY | Freq: Two times a day (BID) | RESPIRATORY_TRACT | 2 refills | Status: AC

## 2021-08-23 MED ORDER — ARMOUR THYROID 90 MG PO TABS: 90.0000 mg | ORAL_TABLET | Freq: Every day | ORAL | 0 refills | Status: DC

## 2021-08-23 MED ORDER — MELOXICAM 15 MG PO TABS: 15.0000 mg | ORAL_TABLET | Freq: Every day | ORAL | 0 refills | Status: AC | PRN

## 2021-08-24 LAB — COMPLETE METABOLIC PANEL WITH GFR
AG Ratio: 1.7 (calc) (ref 1.0–2.5)
ALT: 6 U/L (ref 6–29)
AST: 11 U/L (ref 10–30)
Albumin: 3.8 g/dL (ref 3.6–5.1)
Alkaline phosphatase (APISO): 57 U/L (ref 31–125)
BUN: 7 mg/dL (ref 7–25)
CO2: 28 mmol/L (ref 20–32)
Calcium: 8.9 mg/dL (ref 8.6–10.2)
Chloride: 108 mmol/L (ref 98–110)
Creat: 0.92 mg/dL (ref 0.50–0.99)
Globulin: 2.3 g/dL (calc) (ref 1.9–3.7)
Glucose, Bld: 81 mg/dL (ref 65–99)
Potassium: 4.2 mmol/L (ref 3.5–5.3)
Sodium: 144 mmol/L (ref 135–146)
Total Bilirubin: 0.3 mg/dL (ref 0.2–1.2)
Total Protein: 6.1 g/dL (ref 6.1–8.1)
eGFR: 81 mL/min/{1.73_m2} (ref >=60)

## 2021-08-24 LAB — CBC WITH DIFFERENTIAL/PLATELET
Absolute Monocytes: 470 cells/uL (ref 200–950)
Basophils Absolute: 29 cells/uL (ref 0–200)
Basophils Relative: 0.5 %
Eosinophils Absolute: 81 cells/uL (ref 15–500)
Eosinophils Relative: 1.4 %
HCT: 39.6 % (ref 35.0–45.0)
Hemoglobin: 13.3 g/dL (ref 11.7–15.5)
Lymphs Abs: 2129 cells/uL (ref 850–3900)
MCH: 31.1 pg (ref 27.0–33.0)
MCHC: 33.6 g/dL (ref 32.0–36.0)
MCV: 92.5 fL (ref 80.0–100.0)
MPV: 10.1 fL (ref 7.5–12.5)
Monocytes Relative: 8.1 %
Neutro Abs: 3091 cells/uL (ref 1500–7800)
Neutrophils Relative %: 53.3 %
Platelets: 230 10*3/uL (ref 140–400)
RBC: 4.28 10*6/uL (ref 3.80–5.10)
RDW: 12.7 % (ref 11.0–15.0)
Total Lymphocyte: 36.7 %
WBC: 5.8 10*3/uL (ref 3.8–10.8)

## 2021-08-24 LAB — C-REACTIVE PROTEIN: CRP: 2.5 mg/L (ref <8.0)

## 2021-08-24 LAB — SEDIMENTATION RATE: Sed Rate: 11 mm/h (ref 0–20)

## 2021-08-24 LAB — TSH: TSH: 4.78 mIU/L — ABNORMAL HIGH

## 2021-08-26 ENCOUNTER — Encounter: Payer: Self-pay | Admitting: Family Medicine

## 2021-08-26 ENCOUNTER — Other Ambulatory Visit: Payer: Self-pay | Admitting: Family Medicine

## 2021-08-26 DIAGNOSIS — E039 Hypothyroidism, unspecified: Secondary | ICD-10-CM

## 2021-08-26 MED ORDER — ARMOUR THYROID 90 MG PO TABS
90.0000 mg | ORAL_TABLET | Freq: Every day | ORAL | 1 refills | Status: AC
Start: 2021-08-26 — End: ?

## 2021-08-28 ENCOUNTER — Telehealth: Payer: Self-pay

## 2021-08-28 ENCOUNTER — Encounter: Payer: Self-pay | Admitting: Family Medicine

## 2021-08-28 ENCOUNTER — Other Ambulatory Visit: Payer: Self-pay | Admitting: Family Medicine

## 2021-08-28 DIAGNOSIS — N3090 Cystitis, unspecified without hematuria: Secondary | ICD-10-CM

## 2021-08-28 LAB — CULTURE, URINE COMPREHENSIVE
MICRO NUMBER:: 12543526
SPECIMEN QUALITY:: ADEQUATE

## 2021-08-28 MED ORDER — AMOXICILLIN 250 MG PO CAPS: 250.0000 mg | ORAL_CAPSULE | Freq: Two times a day (BID) | ORAL | 0 refills | Status: DC

## 2021-08-28 NOTE — Telephone Encounter (Signed)
Pt called back and does not understand her lab results or why the Rx was sent in, pt stated nobody has told her what is going on FYI

## 2021-08-28 NOTE — Telephone Encounter (Signed)
Copied from [place] [id number]. Topic: General - Other >> Aug 28, 2021  3:37 PM Valere Dross wrote: Reason for CRM: Pt called in wanting someone to reach out to her about er urine labs, please advise

## 2021-08-29 ENCOUNTER — Other Ambulatory Visit: Payer: Self-pay | Admitting: Family Medicine

## 2021-08-29 DIAGNOSIS — N3090 Cystitis, unspecified without hematuria: Secondary | ICD-10-CM

## 2021-08-29 MED ORDER — AMOXICILLIN 500 MG PO CAPS: 500.0000 mg | ORAL_CAPSULE | Freq: Two times a day (BID) | ORAL | 0 refills | Status: AC

## 2021-08-29 NOTE — Telephone Encounter (Signed)
Pt was notified and verbalized understanding.

## 2021-08-29 NOTE — Telephone Encounter (Signed)
Copied from [place] [phone]. Topic: General - Other >> Aug 29, 2021  9:23 AM Tessa Lerner A wrote: Reason for CRM: Nicki with [facility] has called to share that patient's prescription for amoxicillin (AMOXIL) 250 MG capsule [[id number]]  is currently on backorder  Nicki would like to know if it is possible for the patient to be prescribed an alternative  Please contact further

## 2021-08-30 ENCOUNTER — Other Ambulatory Visit: Payer: Self-pay

## 2021-08-30 ENCOUNTER — Other Ambulatory Visit: Payer: Self-pay | Admitting: Family Medicine

## 2021-08-30 ENCOUNTER — Ambulatory Visit: Payer: Self-pay

## 2021-08-30 ENCOUNTER — Telehealth: Payer: Self-pay

## 2021-08-30 DIAGNOSIS — K2900 Acute gastritis without bleeding: Secondary | ICD-10-CM

## 2021-08-30 DIAGNOSIS — R197 Diarrhea, unspecified: Secondary | ICD-10-CM

## 2021-08-30 DIAGNOSIS — R1030 Lower abdominal pain, unspecified: Secondary | ICD-10-CM

## 2021-08-30 LAB — GASTROINTESTINAL PATHOGEN PANEL PCR
C. difficile Tox A/B, PCR: NOT DETECTED
Campylobacter, PCR: NOT DETECTED
Cryptosporidium, PCR: NOT DETECTED
E coli (ETEC) LT/ST PCR: NOT DETECTED
E coli (STEC) stx1/stx2, PCR: NOT DETECTED
E coli 0157, PCR: NOT DETECTED
Giardia lamblia, PCR: NOT DETECTED
Norovirus, PCR: NOT DETECTED
Rotavirus A, PCR: NOT DETECTED
Salmonella, PCR: NOT DETECTED
Shigella, PCR: NOT DETECTED

## 2021-08-30 NOTE — Telephone Encounter (Signed)
Pt. Report abdominal pain x 2 weeks. Seen in ED yesterday. "I can't keep any food down." Pain is below belly button. "The ED said my colon is swollen." Asking for a referral to [facility] in [facility] - phone [phone], per pt. Please advise.    Answer Assessment - Initial Assessment Questions 1. LOCATION: "Where does it hurt?"      Lower abdomen 2. RADIATION: "Does the pain shoot anywhere else?" (e.g., chest, back)     No 3. ONSET: "When did the pain begin?" (e.g., minutes, hours or days ago)       2 weeks ago 4. SUDDEN: "Gradual or sudden onset?"     Gradual 5. PATTERN "Does the pain come and go, or is it constant?"    - If constant: "Is it getting better, staying the same, or worsening?"      (Note: Constant means the pain never goes away completely; most serious pain is constant and it progresses)     - If intermittent: "How long does it last?" "Do you have pain now?"     (Note: Intermittent means the pain goes away completely between bouts)     Constant 6. SEVERITY: "How bad is the pain?"  (e.g., Scale 1-10; mild, moderate, or severe)   - MILD (1-3): doesn't interfere with normal activities, abdomen soft and not tender to touch    - MODERATE (4-7): interferes with normal activities or awakens from sleep, abdomen tender to touch    - SEVERE (8-10): excruciating pain, doubled over, unable to do any normal activities      Severe 7. RECURRENT SYMPTOM: "Have you ever had this type of stomach pain before?" If Yes, ask: "When was the last time?" and "What happened that time?"      No 8. CAUSE: "What do you think is causing the stomach pain?"     Unsure 9. RELIEVING/AGGRAVATING FACTORS: "What makes it better or worse?" (e.g., movement, antacids, bowel movement)     No 10. OTHER SYMPTOMS: "Do you have any other symptoms?" (e.g., back pain, diarrhea, fever, urination pain, vomiting)       Vomiting 11. PREGNANCY: "Is there any chance you are pregnant?" "When was your last menstrual  period?"       No  Protocols used: Abdominal Pain - [facility]

## 2021-08-30 NOTE — Telephone Encounter (Signed)
Copied from [place] [phone]. Topic: Referral - Status >> Aug 30, 2021 12:52 PM Erick Blinks wrote: Reason for CRM: Pt's fiance called to report that they are unable to get in touch with the currently referred GI provider. Pt's fiance is requesting for a new referral to [facility] contact: [phone]  Referral placed per request

## 2021-08-30 NOTE — Progress Notes (Signed)
Patient requested 

## 2021-09-20 NOTE — Progress Notes (Deleted)
Name: Renee Benjamin   MRN: [id number]    DOB: 1981/07/22   Date:09/20/2021       Progress Note  Subjective  Chief Complaint  Follow Up  HPI  FMS: long history of FMS, she states she has been stable around 7/10. Unable to tolerate gabapentin or Lyrica because it causes sedation. She stays active at work.    Bipolar mixed: she was  under the care [facility] in [place] but insurance stopped covering her visits there and she is now back at [place] - she states stress is much higher now, lost her maternal grandmother July 07, 2023, maternal grandmother died 2023/08/13 th from a broken heart and her 20 yo dog was bitten by a copper head  last week when she was supposed to put him down on Monday but her employer did not allow her to take time off, she is super stressed watching him die, she has noticed bowel and bladder incontinence at night since Monday, she is feeling super stressed, she is crying, she is going to take him to vet tomorrow to put him down.  She is now on Caplyta and Ambien at night to sleep.   Lower abdominal pain / Bowel incontinence: lower abdominal pain is with meals, no change in bowel movement or blood in stools during the day but has bowel incontinence / loose stools during the night since this past Monday - she is under a lot of stress. Not eating much, feeling tired   Chronic pain: she was seen by neurosurgeon and also seen by pain clinic but decided not to go back since she has a history of drug abuse and was denied therapy. She did not want to get steroid injections.  She states pain usually bothersome when she seats down on a hard floor , but doing better. Not taking medications at this time. She fell a few weeks ago and went to [facility] and had x-ray of her left foot that was negative for fracture, pain on the foot resolved, but has noticed increase in right buttocks pain and with rom of right leg. Feels stiff and aching    Asthma: she was seen by pulmonologist in the past . She  states she has a nocturnal cough , some sob at night, she states over the past week she has noticed a productive cough, green sputum and feels congestions. She is only using rescue inhaler. She states one of the combo medication caused mouth sore, we will try a different type   History of bed begs: it was the reason of her recurrent rash, she moved and is doing well.    Hypothyroidism: she takes levothyroxine 90 mg [place] thyroid daily and denies hair loss, she is always tired ( unchanged) she denies constipation, last TSH was up , we will recheck it today   Dyslipidemia: discussed life style modification, eat more fish, tree nuts and increase physical activity. 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   Cocaine abuse ([place]) 01/21/2018   Cannabis abuse 01/21/2018  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   Barbiturate abuse ([place]) 01/21/2018   Opiate abuse, episodic ([place]) 01/21/2018   Allergic rhinitis 02/24/2017   Vitamin B12 deficiency 02/24/2017   Chronic right hip pain 04/10/2016   Controlled substance agreement signed 02/01/2016   Obesity 02/01/2016   ADHD (attention deficit hyperactivity disorder) 01/09/2016   Fibromyalgia 01/09/2016   Chronic fatigue 12/04/2015   Thoracic back pain 10/02/2015   Lumbar pain 10/02/2015   Chronic tonsillitis 08/20/2015   Migraine headache with aura 04/10/2015   Hypothyroidism 04/10/2015   PTSD (post-traumatic stress disorder) 04/10/2015   Status post laparoscopic hysterectomy  01/03/2015   Chronic female pelvic pain 11/23/2014   Plantar fasciitis of left foot    Stress fracture    Asthma 12/16/2012    Past Surgical History:  Procedure Laterality Date   ABDOMINAL HYSTERECTOMY  March 2016   Due to uterus being stiched into C Section incision   APPENDECTOMY     APPENDECTOMY  2008   CESAREAN SECTION     CESAREAN SECTION  2014   LAPAROSCOPIC BILATERAL SALPINGECTOMY Bilateral 01/02/2015   Procedure: LAPAROSCOPIC BILATERAL SALPINGECTOMY;  Surgeon: Osborne Oman, MD;  Location: [facility];  Service: Gynecology;  Laterality: Bilateral;   LAPAROSCOPIC HYSTERECTOMY N/A 01/02/2015   Procedure: HYSTERECTOMY TOTAL LAPAROSCOPIC;  Surgeon: Osborne Oman, MD;  Location: [facility];  Service: Gynecology;  Laterality: N/A;   SHOULDER SURGERY Right    SHOULDER SURGERY  2001   right    Family History  Problem Relation Age of Onset   Osteoporosis Mother    Diabetes Mother    Lupus Mother    Diabetes Father    Osteoporosis Maternal Grandmother    Breast cancer Maternal Grandmother 39       and 2nd time 14   Ovarian cancer Maternal Grandmother    Heart failure Maternal Grandfather     Social History   Tobacco Use   Smoking status: Former    Packs/day: 0.25    Years: 4.00    Pack years: 1.00    Types: Cigarettes    Quit date: 04/17/2012    Years since quitting: 9.4   Smokeless tobacco: Never  Substance Use Topics   Alcohol use: No    Alcohol/week: 0.0 standard drinks     Current Outpatient Medications:    albuterol (PROVENTIL HFA) 108 (90 Base) MCG/ACT inhaler, Inhale 2 puffs into the lungs every 4 (four) hours as needed for wheezing or shortness of breath., Disp: 1 each, Rfl: 0   amoxicillin (AMOXIL) 500 MG capsule, Take 1 capsule (500 mg total) by mouth 2 (two) times daily., Disp: 6 capsule, Rfl: 0   amphetamine-dextroamphetamine (ADDERALL) 20 MG tablet, Take 20 mg by mouth 2 (two) times daily., Disp: , Rfl:    ARMOUR THYROID 90 MG tablet, Take 1 tablet (90 mg  total) by mouth daily. And one extra pill once a week, Disp: 34 tablet, Rfl: 1   clonazePAM (KLONOPIN) 0.5 MG tablet, Take 0.5 mg by mouth 2 (two) times daily as needed for anxiety., Disp: , Rfl:    fluticasone-salmeterol (ADVAIR) 100-50 MCG/ACT AEPB, Inhale 1 puff into the lungs 2 (two) times daily., Disp: 1 each, Rfl: 2   lumateperone tosylate (CAPLYTA) 42 MG capsule, Take 42 mg by mouth daily., Disp: , Rfl:    meloxicam (MOBIC) 15 MG tablet, Take 1 tablet (15 mg total) by mouth daily as needed for pain., Disp: 90 tablet, Rfl: 0   prazosin (MINIPRESS) 2 MG capsule, Take 2  mg by mouth at bedtime., Disp: , Rfl:    zolpidem (AMBIEN) 10 MG tablet, Take 1 tablet by mouth daily., Disp: , Rfl:   Allergies  Allergen Reactions   Depakote [Divalproex Sodium] Hives   Morphine Hives   Pneumococcal Vaccines    Prednisone     Possibly mania   Propoxyphene Other (See Comments)   Tramadol Nausea Only   Tramadol Nausea And Vomiting and Other (See Comments)   Darvocet [Propoxyphene N-Acetaminophen] Rash   Valproic Acid Rash    I personally reviewed active problem list, medication list, allergies, family history, social history, health maintenance with the patient/caregiver today.   ROS  ***  Objective  There were no vitals filed for this visit.  There is no height or weight on file to calculate BMI.  Physical Exam ***  Recent Results (from the past 2160 hour(s))  COMPLETE METABOLIC PANEL WITH GFR     Status: None   Collection Time: 08/23/21  9:06 AM  Result Value Ref Range   Glucose, Bld 81 65 - 99 mg/dL    Comment: .            Fasting reference interval .    BUN 7 7 - 25 mg/dL   Creat 0.92 0.50 - 0.99 mg/dL   eGFR 81 > OR = 60 mL/min/1.8m    Comment: The eGFR is based on the CKD-EPI 2021 equation. To calculate  the new eGFR from a previous Creatinine or Cystatin C result, go to https://www.kidney.org/professionals/ kdoqi/gfr%5Fcalculator    BUN/Creatinine Ratio NOT  APPLICABLE 6 - 22 (calc)   Sodium 144 135 - 146 mmol/L   Potassium 4.2 3.5 - 5.3 mmol/L   Chloride 108 98 - 110 mmol/L   CO2 28 20 - 32 mmol/L   Calcium 8.9 8.6 - 10.2 mg/dL   Total Protein 6.1 6.1 - 8.1 g/dL   Albumin 3.8 3.6 - 5.1 g/dL   Globulin 2.3 1.9 - 3.7 g/dL (calc)   AG Ratio 1.7 1.0 - 2.5 (calc)   Total Bilirubin 0.3 0.2 - 1.2 mg/dL   Alkaline phosphatase (APISO) 57 31 - 125 U/L   AST 11 10 - 30 U/L   ALT 6 6 - 29 U/L  CBC with Differential/Platelet     Status: None   Collection Time: 08/23/21  9:06 AM  Result Value Ref Range   WBC 5.8 3.8 - 10.8 Thousand/uL   RBC 4.28 3.80 - 5.10 Million/uL   Hemoglobin 13.3 11.7 - 15.5 g/dL   HCT 39.6 35.0 - 45.0 %   MCV 92.5 80.0 - 100.0 fL   MCH 31.1 27.0 - 33.0 pg   MCHC 33.6 32.0 - 36.0 g/dL   RDW 12.7 11.0 - 15.0 %   Platelets 230 140 - 400 Thousand/uL   MPV 10.1 7.5 - 12.5 fL   Neutro Abs 3,091 1,500 - 7,800 cells/uL   Lymphs Abs 2,129 850 - 3,900 cells/uL   Absolute Monocytes 470 200 - 950 cells/uL   Eosinophils Absolute 81 15 - 500 cells/uL   Basophils Absolute 29 0 - 200 cells/uL   Neutrophils Relative % 53.3 %   Total Lymphocyte 36.7 %   Monocytes Relative 8.1 %   Eosinophils Relative 1.4 %   Basophils Relative 0.5 %  Sedimentation rate     Status: None   Collection Time: 08/23/21  9:06 AM  Result Value Ref Range   Sed Rate 11 0 - 20 mm/h  C-reactive protein     Status:  None   Collection Time: 08/23/21  9:06 AM  Result Value Ref Range   CRP 2.5 <8.0 mg/L  TSH     Status: Abnormal   Collection Time: 08/23/21  9:06 AM  Result Value Ref Range   TSH 4.78 (H) mIU/L    Comment:           Reference Range .           > or = 20 Years  0.40-4.50 .                Pregnancy Ranges           First trimester    0.26-2.66           Second trimester   0.55-2.73           Third trimester    0.43-2.91   Gastrointestinal Pathogen Panel PCR     Status: None   Collection Time: 08/26/21 11:00 AM  Result Value Ref Range    Campylobacter, PCR NOT DETECTED NOT DETECTED   C. difficile Tox A/B, PCR NOT DETECTED NOT DETECTED   E coli 0157, PCR NOT DETECTED NOT DETECTED   E coli (ETEC) LT/ST PCR NOT DETECTED NOT DETECTED   E coli (STEC) stx1/stx2, PCR NOT DETECTED NOT DETECTED   Salmonella, PCR NOT DETECTED NOT DETECTED   Shigella, PCR NOT DETECTED NOT DETECTED   Norovirus, PCR NOT DETECTED NOT DETECTED   Rotavirus A, PCR NOT DETECTED NOT DETECTED   Giardia lamblia, PCR NOT DETECTED NOT DETECTED   Cryptosporidium, PCR NOT DETECTED NOT DETECTED    Comment: . The xTAG(R) Gastrointestinal Pathogen Panel results are presumptive and must be confirmed by FDA-cleared tests or other acceptable reference methods. The results of this test should not be used as the sole basis for diagnosis, treatment, or other patient management decisions. . Performed using the [employer] xTAG(R) Gastrointestinal Pathogen Panel test kit.   CULTURE, URINE COMPREHENSIVE     Status: Abnormal   Collection Time: 08/26/21 11:01 AM   Specimen: Urine  Result Value Ref Range   MICRO NUMBER: [id number]    SPECIMEN QUALITY: Adequate    Source OTHER (SPECIFY)    STATUS: FINAL    ISOLATE 1: Staphylococcus haemolyticus (A)     Comment: 50,000-100,000 CFU/mL of Staphylococcus haemolyticus May represent colonizers from external and internal genitalia. No further testing (including susceptibility) will be performed.     PHQ2/9: Depression screen [facility] 2/9 08/23/2021 05/22/2021 01/02/2021 12/05/2020 11/29/2020  Decreased Interest 3 0 1 0 0  Down, Depressed, Hopeless _0 0 0  PHQ - 2 Score _1 0 0  Altered sleeping 0 0 0 - -  Tired, decreased energy 3 0 0 - -  Change in appetite 3 2 0 - -  Feeling bad or failure about yourself  0 0 0 - -  Trouble concentrating 1 0 0 - -  Moving slowly or fidgety/restless 0 0 0 - -  Suicidal thoughts 0 0 0 - -  PHQ-9 Score _2 - -  Difficult doing work/chores Somewhat difficult - - - -  Some recent data might be  hidden    phq 9 is {gen pos [id number]}   Fall Risk: Fall Risk  08/23/2021 05/22/2021 01/02/2021 12/05/2020 11/29/2020  Falls in the past year? 1 0 1 1 0  Number falls in past yr: 1 0 0 0 0  Injury with Fall? 1 0 1 1 0  Risk for fall  due to : Impaired balance/gait - - History of fall(s) -  Follow up Falls prevention discussed - - Falls evaluation completed -      Functional Status Survey:      Assessment & Plan  *** There are no diagnoses linked to this encounter.

## 2021-09-23 ENCOUNTER — Ambulatory Visit: Payer: Medicaid Other | Admitting: Family Medicine

## 2021-10-01 ENCOUNTER — Ambulatory Visit: Payer: Medicaid Other | Admitting: Internal Medicine

## 2021-10-02 ENCOUNTER — Encounter: Payer: Self-pay | Admitting: Internal Medicine

## 2021-10-02 ENCOUNTER — Other Ambulatory Visit: Payer: Self-pay | Admitting: Family Medicine

## 2021-10-02 ENCOUNTER — Encounter: Payer: Medicaid Other | Admitting: Internal Medicine

## 2021-10-02 DIAGNOSIS — E039 Hypothyroidism, unspecified: Secondary | ICD-10-CM

## 2021-10-02 NOTE — Telephone Encounter (Signed)
Attempted to contact patient to have her come in for lab draw, no answer and mailbox was full.Will try again this afternoon. If she calls back I will let you know.

## 2021-10-02 NOTE — Telephone Encounter (Signed)
Attempted to contact, no answer, voicemail full.

## 2021-10-02 NOTE — Progress Notes (Incomplete)
Acute Office Visit  Subjective:    Patient ID: Renee Benjamin, female    DOB: 1981/09/09, 40 y.o.   MRN: [id number]  No chief complaint on file.   HPI Patient is in today for eye pain.  EYE PAIN Duration:  {Blank single:19197::"days","weeks","months"} Involved eye:  {Blank single:19197::"left","right","bilateral"} Onset: {Blank single:19197::"sudden","gradual"} Severity: {Blank single:19197::"mild","moderate","severe","1/10","2/10","3/10","4/10","5/10","6/10","7/10","8/10","9/10","10/10"}  Quality: {Blank multiple:19196::"sharp","dull","aching","burning","cramping","ill-defined","itchy","pressure-like","pulling","shooting","sore","stabbing","tender","tearing","throbbing"} Foreign body sensation:{Blank single:19197::"yes","no"} Visual impairment: {Blank single:19197::"yes","no"} Eye redness: {Blank single:19197::"yes","no"} Discharge: {Blank single:19197::"yes","no"} Crusting or matting of eyelids: {Blank single:19197::"yes","no"} Swelling: {Blank single:19197::"yes","no"} Photophobia: {Blank single:19197::"yes","no"} Itching: {Blank single:19197::"yes","no"} Tearing: {Blank single:19197::"yes","no"} Headache: {Blank single:19197::"yes","no"} Floaters: {Blank single:19197::"yes","no"} URI symptoms: {Blank single:19197::"yes","no"} Contact lens use: {Blank single:19197::"yes","no"} Close contacts with similar problems: {Blank single:19197::"yes","no"} Eye trauma: {Blank single:19197::"yes","no"} Aggravating factors:  Alleviating factors:  Status: {Blank multiple:19196::"better","worse","stable","fluctuating"} Treatments attempted:   Past Medical History:  Diagnosis Date   ADHD (attention deficit hyperactivity disorder) 01/09/2016   Allergy    Anemia    Anxiety    Anxiety    separation anxiety   Asthma    Chronic right hip pain 04/10/2016   Collagen vascular disease ([place])    Endometriosis    Fibromyalgia 01/09/2016   Headache    otc med prn   Hypothyroidism     Plantar fasciitis of left foot 08.29.14   PLANTAR HEEL ,FLUID AROUND MEDIAL BAND   Polycystic ovarian disease    Polysubstance abuse ([place]) 01/22/2018   ER visit March 2019: Drug screen is positive for marijuana, cocaine, benzodiazepines, barbiturates, and opiates   Stress fracture 08.15.14   RIGHT    Thoracic back pain    Thyroid disease    Vitamin D deficiency disease     Past Surgical History:  Procedure Laterality Date   ABDOMINAL HYSTERECTOMY  March 2016   Due to uterus being stiched into C Section incision   APPENDECTOMY     APPENDECTOMY  2008   CESAREAN SECTION     CESAREAN SECTION  2014   LAPAROSCOPIC BILATERAL SALPINGECTOMY Bilateral 01/02/2015   Procedure: LAPAROSCOPIC BILATERAL SALPINGECTOMY;  Surgeon: Osborne Oman, MD;  Location: [facility];  Service: Gynecology;  Laterality: Bilateral;   LAPAROSCOPIC HYSTERECTOMY N/A 01/02/2015   Procedure: HYSTERECTOMY TOTAL LAPAROSCOPIC;  Surgeon: Osborne Oman, MD;  Location: [facility];  Service: Gynecology;  Laterality: N/A;   SHOULDER SURGERY Right    SHOULDER SURGERY  2001   right    Family History  Problem Relation Age of Onset   Osteoporosis Mother    Diabetes Mother    Lupus Mother    Diabetes Father    Osteoporosis Maternal Grandmother    Breast cancer Maternal Grandmother 30       and 2nd time 74   Ovarian cancer Maternal Grandmother    Heart failure Maternal Grandfather     Social History   Socioeconomic History   Marital status: Single    Spouse name: Not on file   Number of children: 1   Years of education: Not on file   Highest education level: GED or equivalent  Occupational History   Not on file  Tobacco Use   Smoking status: Former    Packs/day: 0.25    Years: 4.00    Pack years: 1.00    Types: Cigarettes    Quit date: 04/17/2012    Years since quitting: 9.4   Smokeless tobacco: Never  Vaping Use   Vaping Use: Never used  Substance and Sexual Activity   Alcohol use: No    Alcohol/week: 0.0  standard drinks  Drug use: Not Currently    Types: Marijuana, Cocaine   Sexual activity: Yes    Partners: Male    Birth control/protection: None  Other Topics Concern   Not on file  Social History Narrative   Lives with boyfriend and son   Works in [place] Strain: Low Risk    Difficulty of Paying Living Expenses: Not hard at all  Food Insecurity: No Food Insecurity   Worried About Charity fundraiser in the Last Year: Never true   Arboriculturist in the Last Year: Never true  Transportation Needs: No Transportation Needs   Lack of Transportation (Medical): No   Lack of Transportation (Non-Medical): No  Physical Activity: Sufficiently Active   Days of Exercise per Week: 7 days   Minutes of Exercise per Session: 30 min  Stress: No Stress Concern Present   Feeling of Stress : Not at all  Social Connections: Moderately Isolated   Frequency of Communication with Friends and Family: More than three times a week   Frequency of Social Gatherings with Friends and Family: Once a week   Attends Religious Services: Never   Marine scientist or Organizations: No   Attends Music therapist: Never   Marital Status: Living with partner  Intimate Partner Violence: Not At Risk   Fear of Current or Ex-Partner: No   Emotionally Abused: No   Physically Abused: No   Sexually Abused: No    Outpatient Medications Prior to Visit  Medication Sig Dispense Refill   albuterol (PROVENTIL HFA) 108 (90 Base) MCG/ACT inhaler Inhale 2 puffs into the lungs every 4 (four) hours as needed for wheezing or shortness of breath. 1 each 0   amoxicillin (AMOXIL) 500 MG capsule Take 1 capsule (500 mg total) by mouth 2 (two) times daily. 6 capsule 0   amphetamine-dextroamphetamine (ADDERALL) 20 MG tablet Take 20 mg by mouth 2 (two) times daily.     ARMOUR THYROID 90 MG tablet Take 1 tablet (90 mg total) by mouth daily. And one extra pill  once a week 34 tablet 1   clonazePAM (KLONOPIN) 0.5 MG tablet Take 0.5 mg by mouth 2 (two) times daily as needed for anxiety.     fluticasone-salmeterol (ADVAIR) 100-50 MCG/ACT AEPB Inhale 1 puff into the lungs 2 (two) times daily. 1 each 2   lumateperone tosylate (CAPLYTA) 42 MG capsule Take 42 mg by mouth daily.     meloxicam (MOBIC) 15 MG tablet Take 1 tablet (15 mg total) by mouth daily as needed for pain. 90 tablet 0   prazosin (MINIPRESS) 2 MG capsule Take 2 mg by mouth at bedtime.     zolpidem (AMBIEN) 10 MG tablet Take 1 tablet by mouth daily.     No facility-administered medications prior to visit.    Allergies  Allergen Reactions   Depakote [Divalproex Sodium] Hives   Morphine Hives   Pneumococcal Vaccines    Prednisone     Possibly mania   Propoxyphene Other (See Comments)   Tramadol Nausea Only   Tramadol Nausea And Vomiting and Other (See Comments)   Darvocet [Propoxyphene N-Acetaminophen] Rash   Valproic Acid Rash    Review of Systems     Objective:    Physical Exam  LMP 10/30/2014  Wt Readings from Last 3 Encounters:  08/23/21 195 lb 3.2 oz (88.5 kg)  05/22/21 203 lb (92.1 kg)  03/24/21  204 lb (92.5 kg)    Health Maintenance Due  Topic Date Due   COVID-19 Vaccine (5 - Booster for Moderna series) 03/07/2021    There are no preventive care reminders to display for this patient.   Lab Results  Component Value Date   TSH 4.78 (H) 08/23/2021   Lab Results  Component Value Date   WBC 5.8 08/23/2021   HGB 13.3 08/23/2021   HCT 39.6 08/23/2021   MCV 92.5 08/23/2021   PLT 230 08/23/2021   Lab Results  Component Value Date   NA 144 08/23/2021   K 4.2 08/23/2021   CO2 28 08/23/2021   GLUCOSE 81 08/23/2021   BUN 7 08/23/2021   CREATININE 0.92 08/23/2021   BILITOT 0.3 08/23/2021   ALKPHOS 72 03/08/2019   AST 11 08/23/2021   ALT 6 08/23/2021   PROT 6.1 08/23/2021   ALBUMIN 4.1 03/08/2019   CALCIUM 8.9 08/23/2021   ANIONGAP 8 03/24/2021    EGFR 81 08/23/2021   Lab Results  Component Value Date   CHOL 141 05/22/2021   Lab Results  Component Value Date   HDL 31 (L) 05/22/2021   Lab Results  Component Value Date   LDLCALC 90 05/22/2021   Lab Results  Component Value Date   TRIG 104 05/22/2021   Lab Results  Component Value Date   CHOLHDL 4.5 05/22/2021   Lab Results  Component Value Date   HGBA1C 5.2 04/09/2020       Assessment & Plan:   Problem List Items Addressed This Visit   None    No orders of the defined types were placed in this encounter.    Teodora Medici, DO

## 2021-10-03 NOTE — Progress Notes (Signed)
This encounter was created in error - please disregard.

## 2021-10-29 IMAGING — MG DIGITAL DIAGNOSTIC BILAT W/ TOMO W/ CAD
6 of 10 series · 6 of 30 positions shown · non-contrast
Comparison: Previous exam(s).

CLINICAL DATA: Focal pain at 12 o'clock in the left breast.
Nonfocal fullness in the inferior left breast.

EXAM:
DIGITAL DIAGNOSTIC BILATERAL MAMMOGRAM WITH CAD AND TOMO
ULTRASOUND LEFT BREAST

[R CC synth-2D]
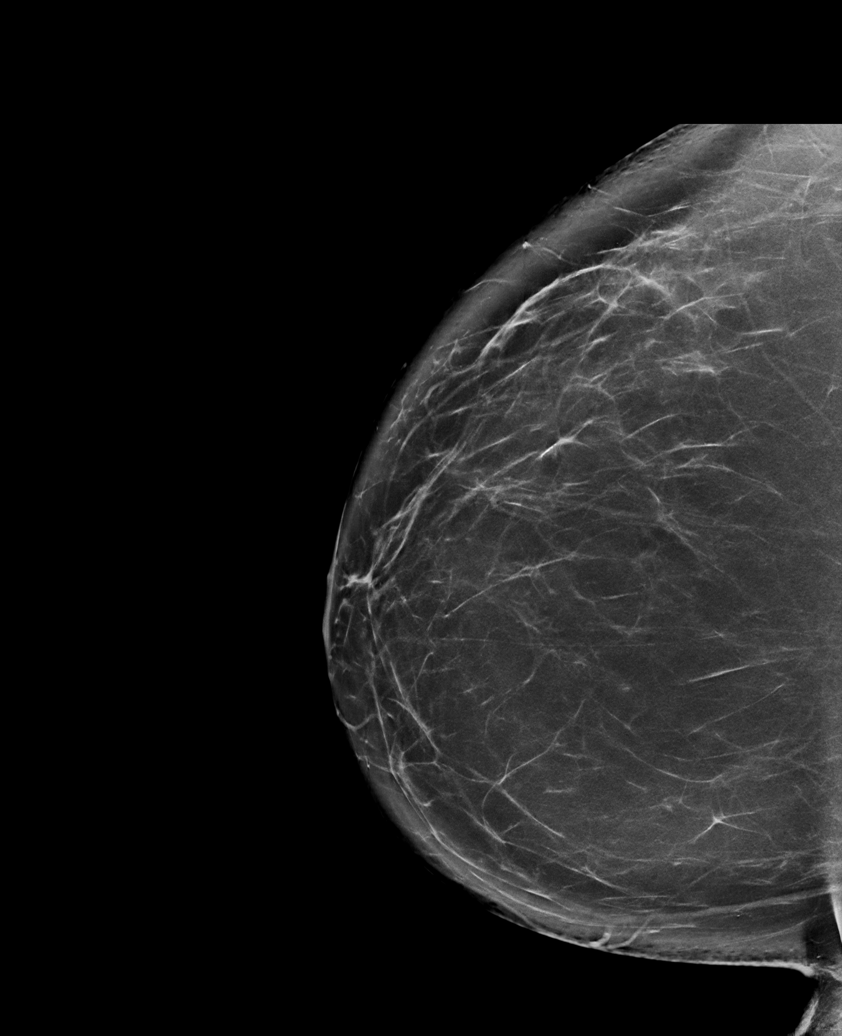

[R MLO synth-2D]
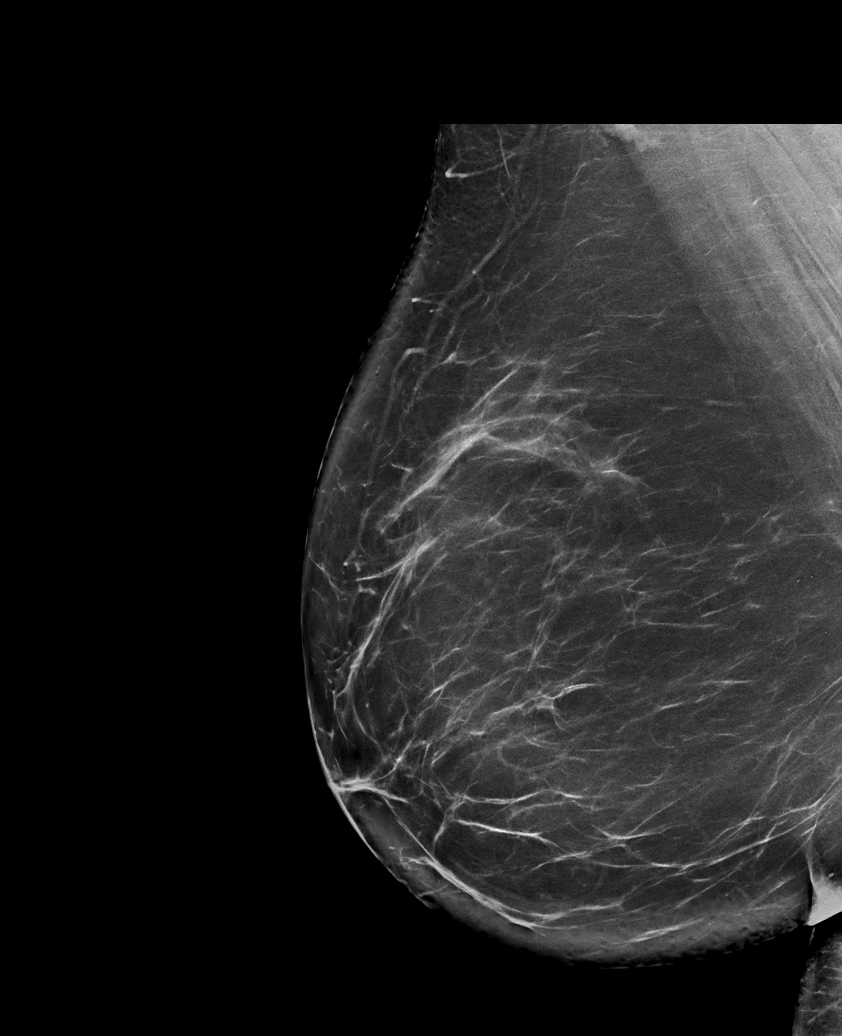

[L TAN synth-2D]
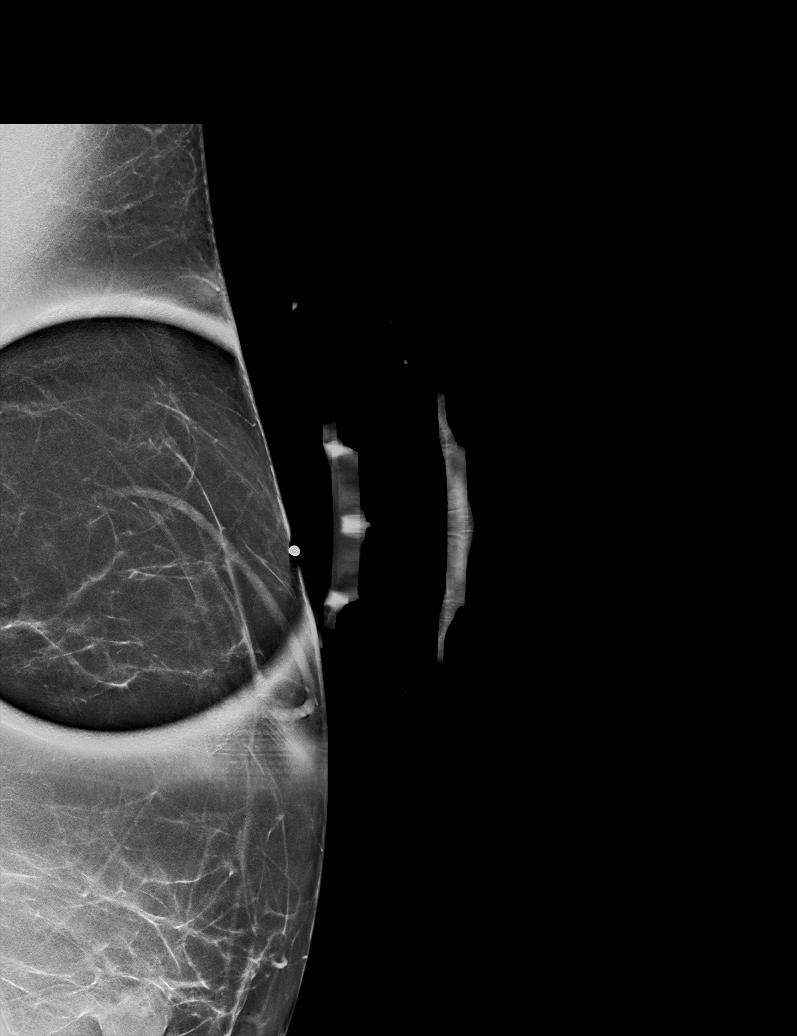

[L MLO synth-2D]
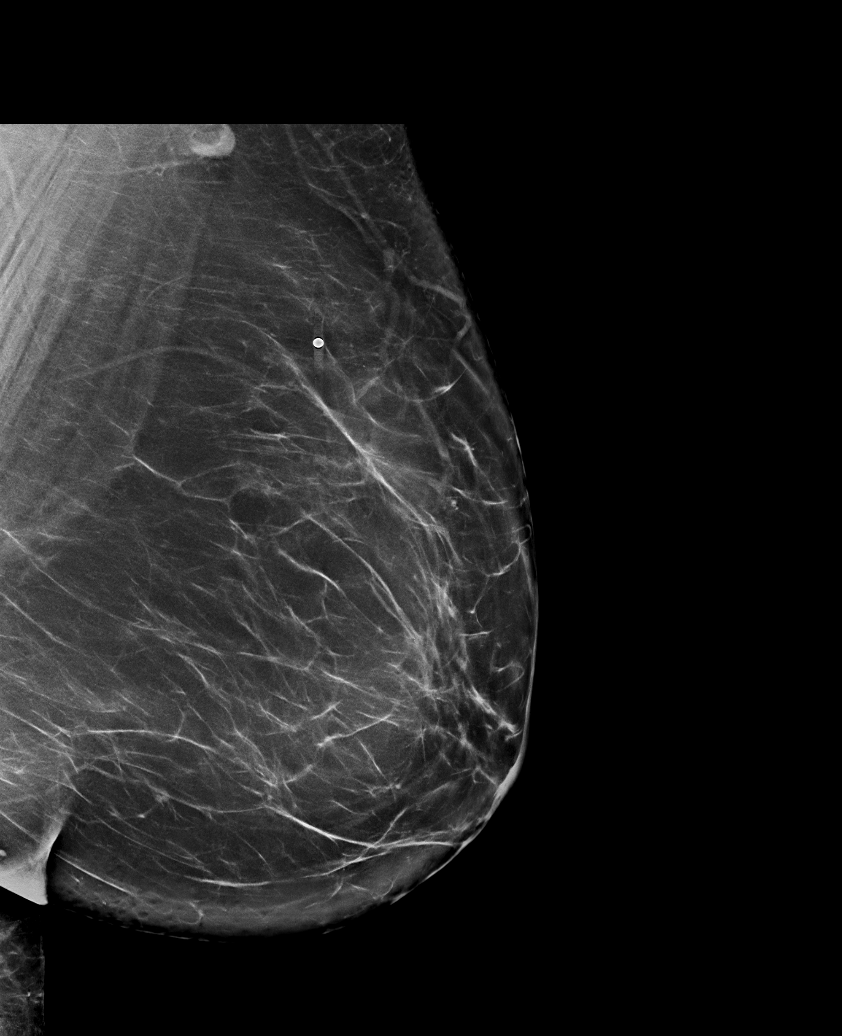

[L CC synth-2D]
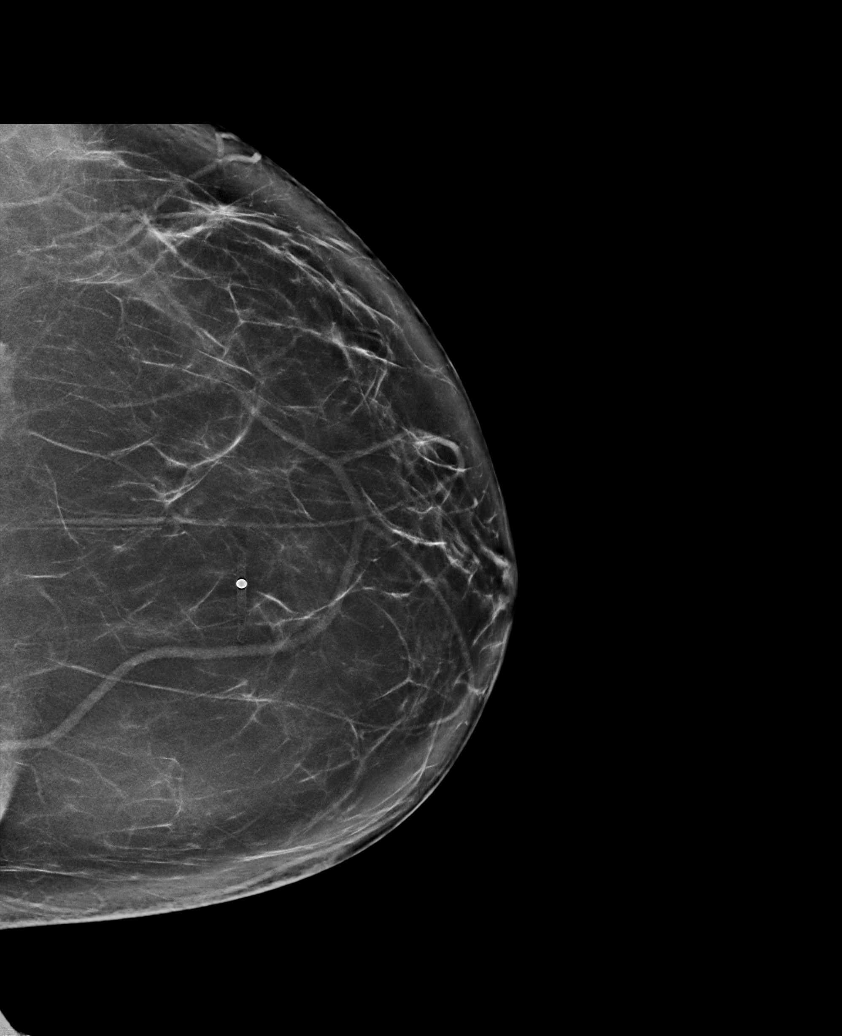

[L MLO tomo · tomo slice 50/99.0]
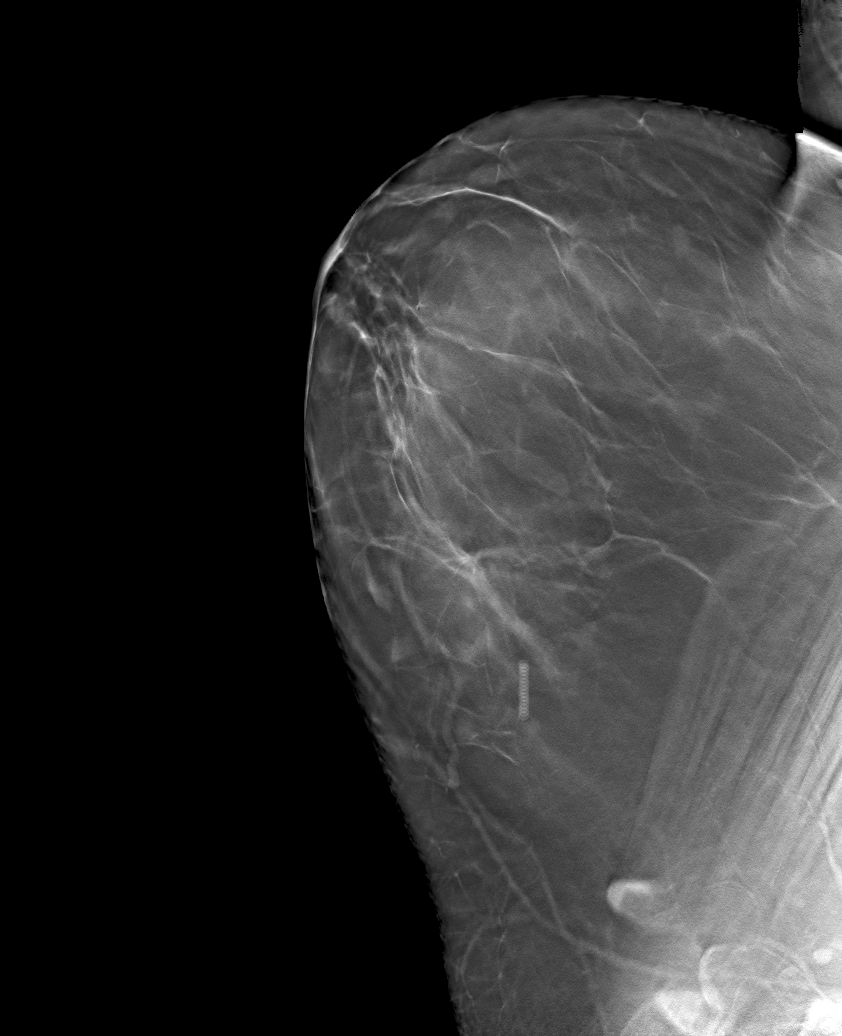

[6 of 30 positions shown; findings below may reference images not displayed]

ACR Breast Density Category b: There are scattered areas of
fibroglandular density.
FINDINGS: No suspicious masses, calcifications, or distortion are seen in
either breast.

Mammographic images were processed with CAD.

On physical exam, no suspicious lumps are identified.

Targeted ultrasound is performed, showing no sonographic
abnormalities in the inferior left breast or in the region of focal
pain.
IMPRESSION: No mammographic or sonographic evidence of malignancy.

RECOMMENDATION:
Recommend annual screening mammography.

The patient's mother was diagnosed with breast cancer at the age of
37 and her grandmother was diagnosed with breast cancer at the age
of 47. As a result, recommend genetic counseling for this patient.
If the patient is found to be genetically negative or is not
genetically tested, recommend assessing the patient's lifetime risk
of breast cancer with a family based wrist model such as the
Tyrer-Cuzick. If the patient is genetically positive or has greater
than a 20% lifetime risk of breast cancer, recommend annual
mammography and MRI.

I have discussed the findings and recommendations with the patient.
If applicable, a reminder letter will be sent to the patient
regarding the next appointment.

BI-RADS CATEGORY  2: Benign.

## 2021-10-30 ENCOUNTER — Other Ambulatory Visit: Payer: Self-pay | Admitting: Family Medicine

## 2021-10-30 ENCOUNTER — Other Ambulatory Visit: Payer: Self-pay

## 2021-10-30 DIAGNOSIS — E039 Hypothyroidism, unspecified: Secondary | ICD-10-CM

## 2021-10-30 NOTE — Progress Notes (Signed)
d 

## 2021-10-30 NOTE — Telephone Encounter (Signed)
Tried calling patient but vm was full. Per Sowles and her nurse patient needs to set up an appt for first avail ( can be 2023 in Feb for visit or first avail) Before she can give medication she will have to come and have blood work done and then she will call in her medication, but has to have a follow up in Feb 2023 or first avail.

## 2021-10-31 NOTE — Telephone Encounter (Signed)
Spoke with pt and she informed me that the only reason she has not scheduled an appointment is b/c we do not accept her insurance. She is hoping that you will give her enough to last until feb (that is when she will be able to see her new provider.)

## 2022-06-06 ENCOUNTER — Other Ambulatory Visit: Payer: Medicaid Other

## 2022-08-12 IMAGING — CR DG CHEST 2V
2 series · 2 of 2 positions shown · non-contrast
Comparison: 10/14/2019

CLINICAL DATA: Cough.  History of asthma

EXAM:
CHEST - 2 VIEW

[chest pa]
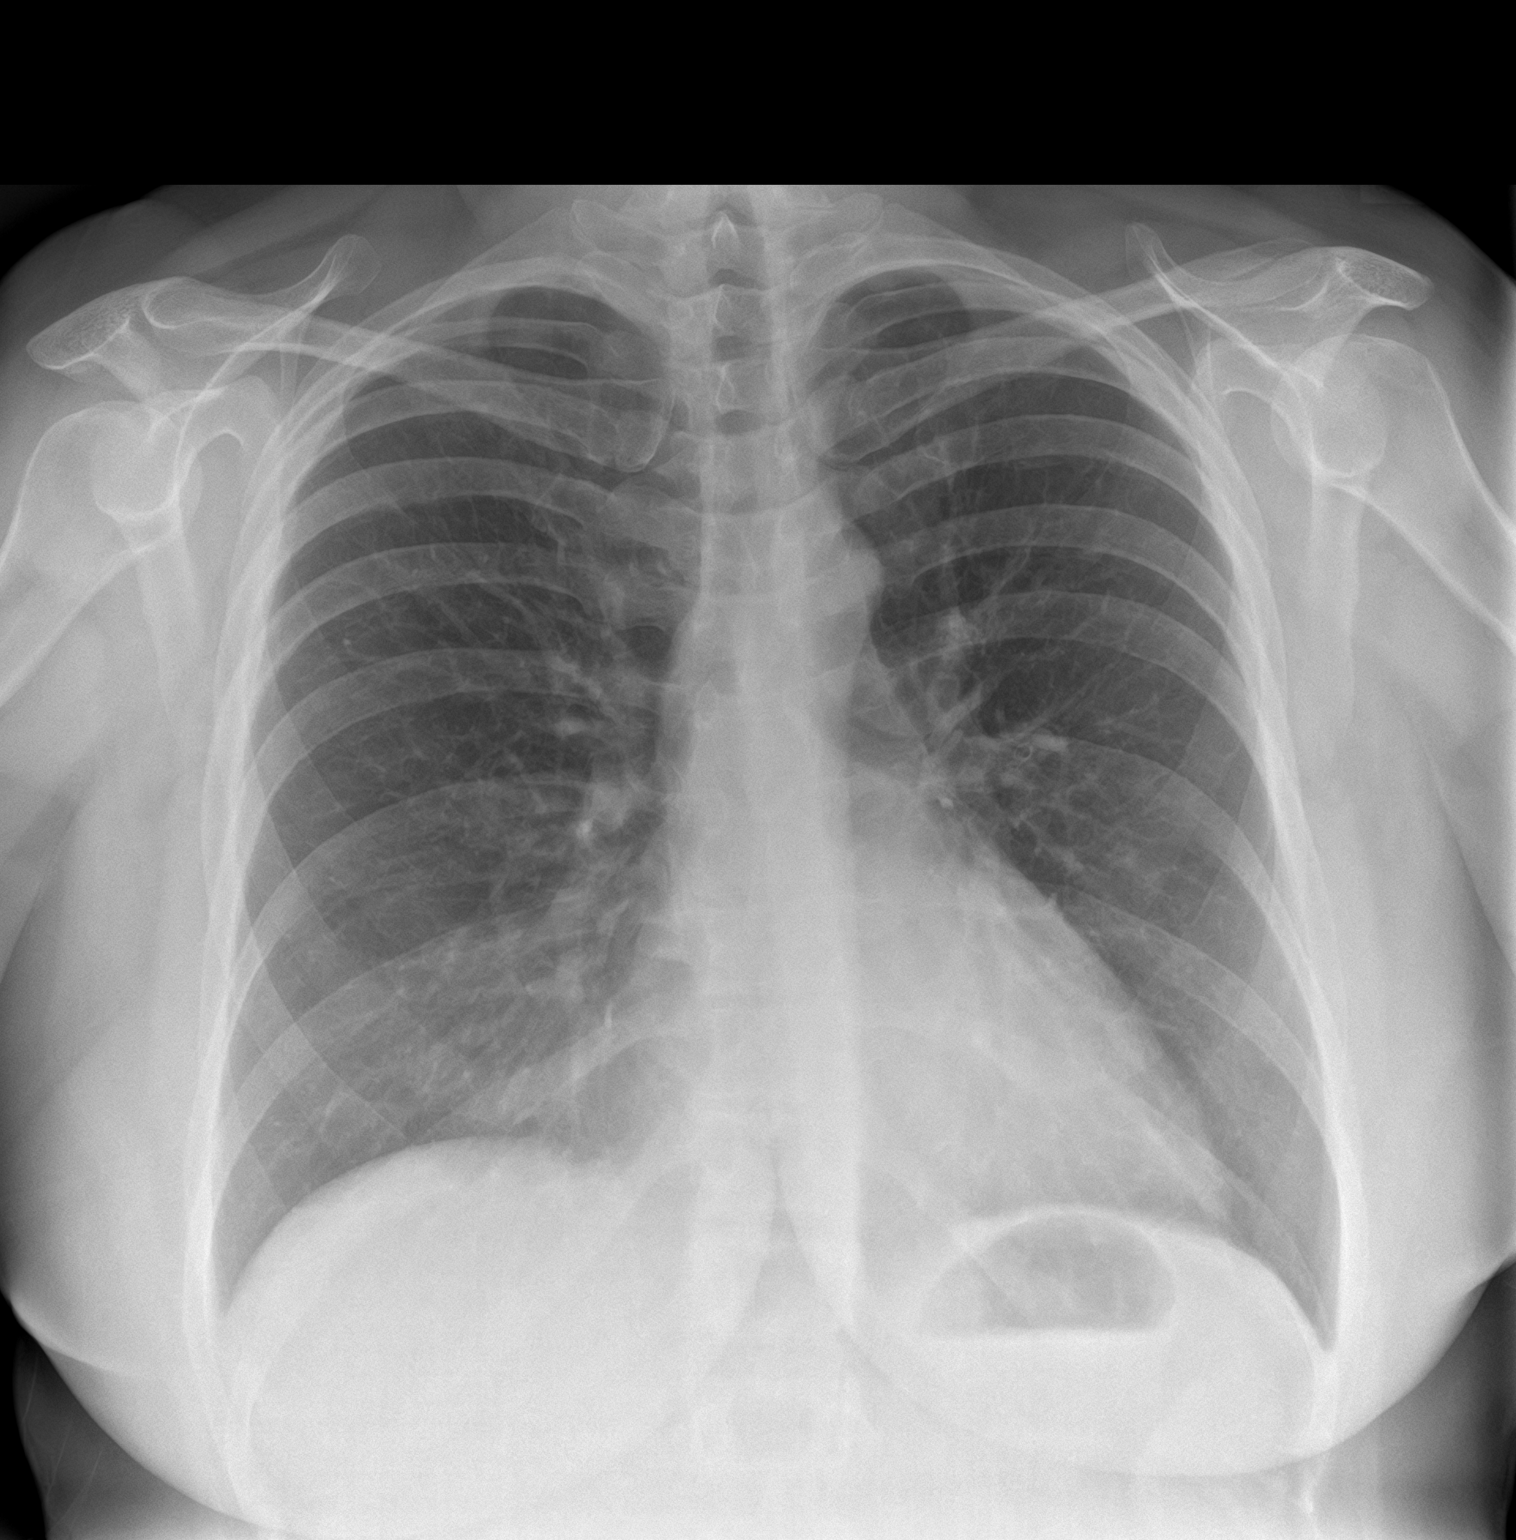

[chest lat]
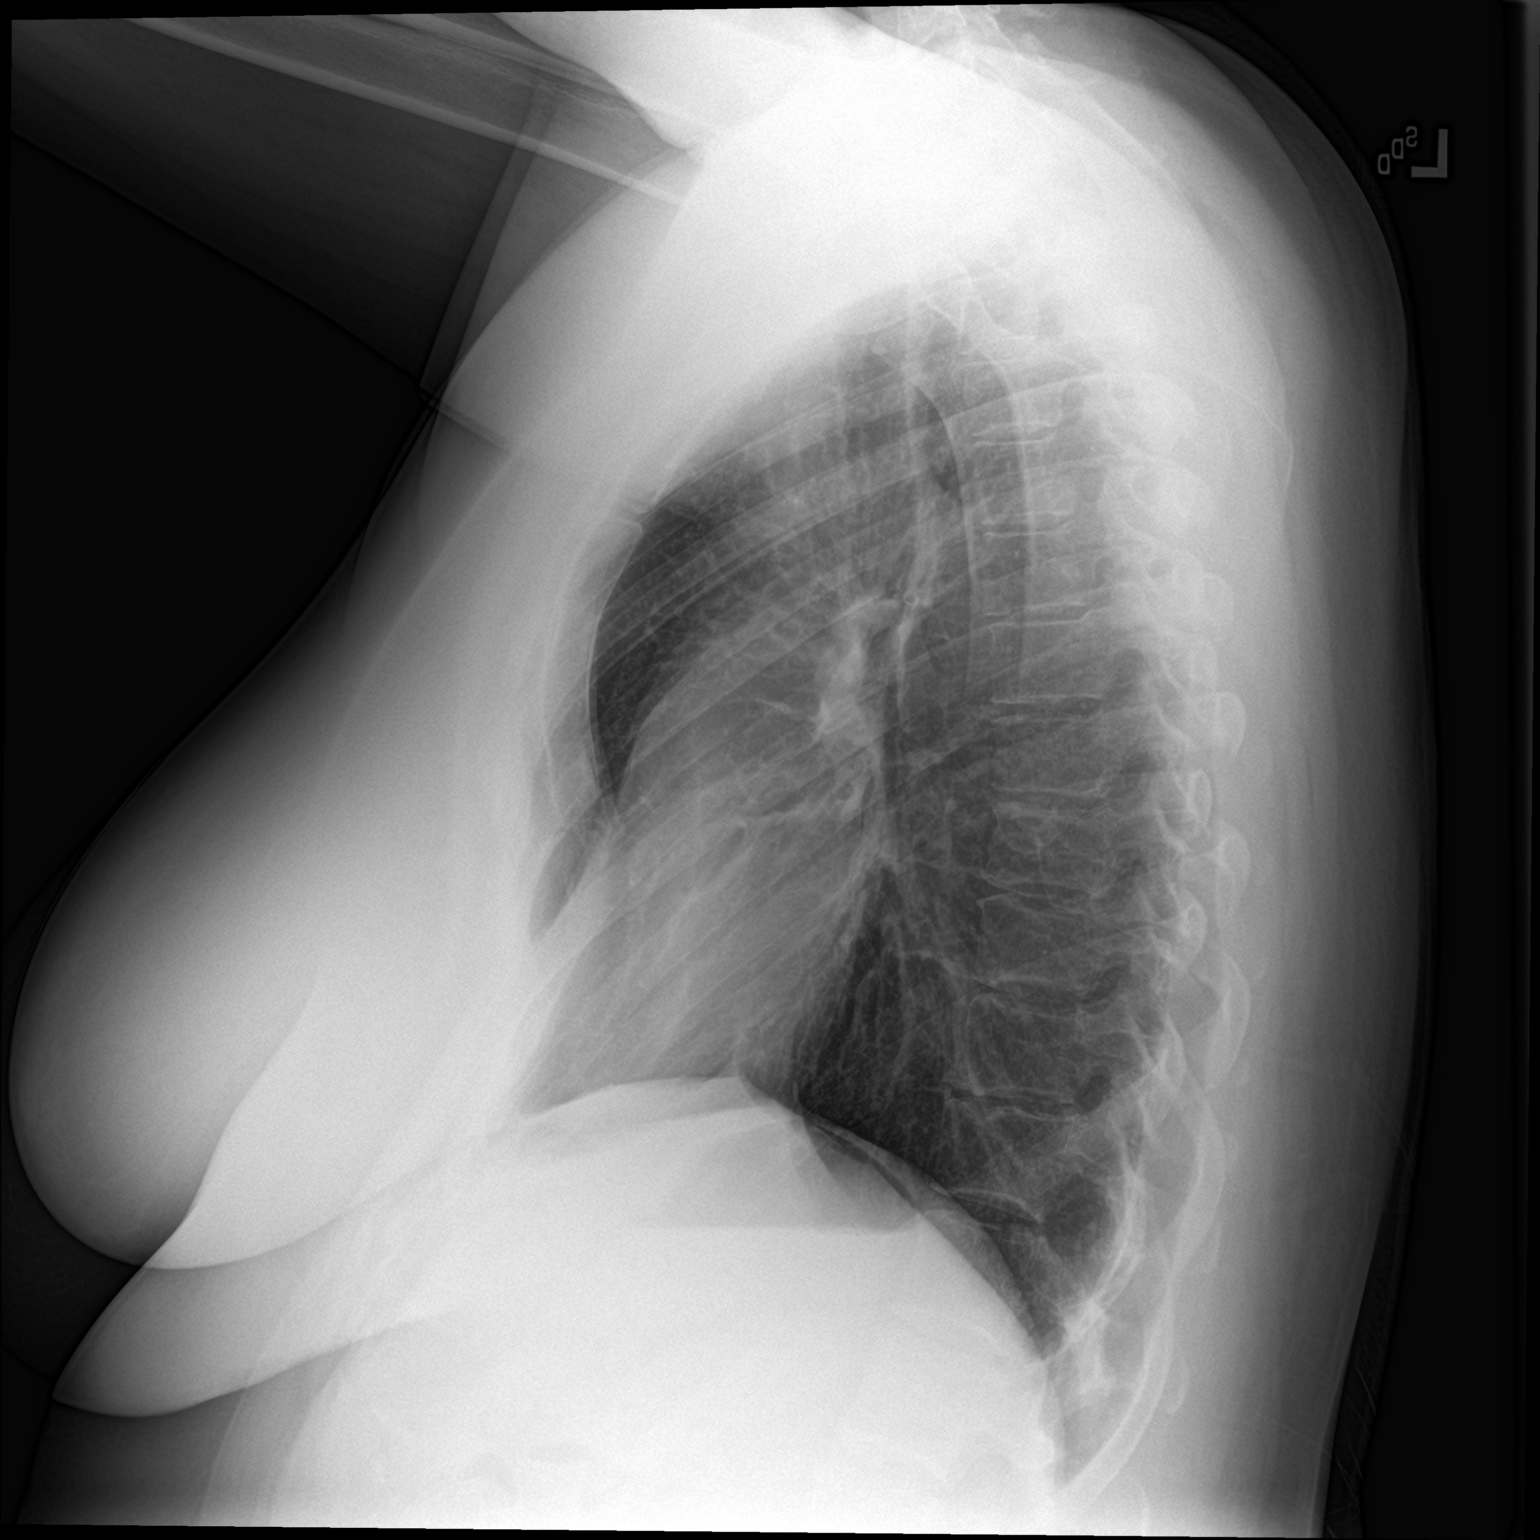

[2 of 2 positions shown; findings below may reference images not displayed]

FINDINGS: Normal heart size and mediastinal contours. No acute infiltrate or
edema. No effusion or pneumothorax. Pectus excavatum. No acute
osseous findings.
IMPRESSION: No active cardiopulmonary disease.

## 2023-03-29 ENCOUNTER — Emergency Department (HOSPITAL_COMMUNITY)
Admission: EM | Admit: 2023-03-29 | Discharge: 2023-03-29 | Disposition: A | Discharge status: Home / Self Care | Payer: Medicaid Other | Attending: Emergency Medicine | Admitting: Emergency Medicine

## 2023-03-29 ENCOUNTER — Encounter (HOSPITAL_COMMUNITY): Payer: Self-pay | Admitting: Emergency Medicine

## 2023-03-29 DIAGNOSIS — R519 Headache, unspecified: Secondary | ICD-10-CM | POA: Diagnosis present

## 2023-03-29 MED ORDER — METHYLPREDNISOLONE SODIUM SUCC 125 MG IJ SOLR
125.0000 mg | Freq: Once | INTRAMUSCULAR | Status: AC
  Administered 2023-03-29: 125 mg via INTRAVENOUS
  Filled 2023-03-29: qty 2

## 2023-03-29 MED ORDER — METHOCARBAMOL 500 MG PO TABS
1000.0000 mg | ORAL_TABLET | Freq: Once | ORAL | Status: AC
  Administered 2023-03-29: 1000 mg via ORAL
  Filled 2023-03-29: qty 2

## 2023-03-29 MED ORDER — METHOCARBAMOL 1000 MG/10ML IJ SOLN
1000.0000 mg | Freq: Once | INTRAMUSCULAR | Status: DC
  Filled 2023-03-29: qty 10

## 2023-03-29 MED ORDER — PREDNISONE 20 MG PO TABS: ORAL_TABLET | ORAL | 0 refills | Status: AC

## 2023-03-29 MED ORDER — ONDANSETRON 4 MG PO TBDP
8.0000 mg | ORAL_TABLET | Freq: Once | ORAL | Status: AC
  Administered 2023-03-29: 8 mg via ORAL
  Filled 2023-03-29: qty 2

## 2023-03-29 NOTE — ED Provider Notes (Signed)
[place] EMERGENCY DEPARTMENT AT [facility] Provider Note   CSN: [id number] Arrival date & time: 03/29/23  0112     History  Chief Complaint  Patient presents with   Headache    Renee Benjamin is a 42 y.o. female.  42 yo w/ h/o what sounds like occipital neuralgia (has been seen by multiple specialists: family, ortho, neuro) here with exacerbation of same not improved by robaxin. States steroids usually help. No trauma. Drove here for 2 hours and thinks that has exacerbated it. 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    Headache      Home Medications Prior to Admission medications   Medication Sig Start Date End Date Taking? Authorizing Provider  predniSONE (DELTASONE) 20 MG tablet 3 tabs po daily x 3 days, then 2 tabs x 3 days, then 1.5 tabs x 3 days, then 1 tab x 3 days, then 0.5 tabs x 3 days 03/29/23  Yes Mertice Uffelman, Barbara Cower, MD  albuterol (PROVENTIL HFA) 108 (90 Base) MCG/ACT inhaler Inhale 2 puffs into the lungs every 4 (four) hours as needed for wheezing or shortness of breath. 03/24/21   Sharman Cheek, MD  amoxicillin (AMOXIL) 500 MG capsule Take 1 capsule (500 mg total) by mouth 2 (two) times daily. 08/29/21   Alba Cory, MD  amphetamine-dextroamphetamine (ADDERALL) 20 MG tablet Take 20 mg by mouth 2 (two) times daily. 01/26/19   Pc, [employer] THYROID 90 MG tablet Take 1 tablet (90 mg total) by mouth daily. And one extra pill once a week 08/26/21   Alba Cory, MD  clonazePAM (KLONOPIN) 0.5 MG tablet Take 0.5 mg by mouth 2 (two) times daily as needed for anxiety.    Pc, [employer]  fluticasone-salmeterol (ADVAIR) 100-50 MCG/ACT AEPB Inhale 1 puff into the lungs 2 (two) times daily. 08/23/21   Alba Cory, MD  lumateperone tosylate (CAPLYTA) 42 MG capsule Take 42 mg by mouth daily.    Pc, [employer]   meloxicam (MOBIC) 15 MG tablet Take 1 tablet (15 mg total) by mouth daily as needed for pain. 08/23/21   Alba Cory, MD  prazosin (MINIPRESS) 2 MG capsule Take 2 mg by mouth at bedtime.    Pc, [employer]  zolpidem (AMBIEN) 10 MG tablet Take 1 tablet by mouth daily.    Pc, [employer]      Allergies    Depakote [divalproex sodium], Gabapentin, Lyrica [pregabalin], Morphine, Pneumococcal vaccines, Prednisone, Propoxyphene, Tramadol, Tramadol, Darvocet [propoxyphene n-acetaminophen], and Valproic acid    Review of Systems   Review of Systems  Neurological:  Positive for headaches.    Physical Exam Updated Vital Signs BP (!) 128/100 (BP Location: Right Arm)   Pulse 100   Temp 98 F (36.7 C) (Oral)   Resp 19   Wt 84.8 kg   LMP 10/30/2014   SpO2 95%   BMI 28.43 kg/m  Physical Exam Vitals and nursing note reviewed.  Constitutional:      Appearance: She is well-developed.  HENT:     Head: Normocephalic and atraumatic.  Cardiovascular:     Rate and Rhythm: Normal rate and regular rhythm.  Pulmonary:     Effort: No respiratory distress.     Breath sounds: No stridor.  Abdominal:     General: There is no distension.  Musculoskeletal:     Cervical back: Normal range of motion.  Neurological:     Mental Status: She is alert.     Comments: No altered mental status, able to give full seemingly accurate history.  Face is symmetric, EOM's intact, pupils equal and reactive, vision intact, tongue and uvula midline without deviation. Upper and Lower extremity motor 5/5, intact pain perception in distal extremities, 2+ reflexes in biceps, patella and achilles tendons. Able to perform finger to nose normal with both hands. Walks without assistance or evident ataxia.       ED Results / Procedures / Treatments   Labs (all labs ordered are listed, but only abnormal results are displayed) Labs Reviewed - No data to  display  EKG None  Radiology No results found.  Procedures Procedures    Medications Ordered in ED Medications  methylPREDNISolone sodium succinate (SOLU-MEDROL) 125 mg/2 mL injection 125 mg (125 mg Intravenous Given 03/29/23 0543)  ondansetron (ZOFRAN-ODT) disintegrating tablet 8 mg (8 mg Oral Given 03/29/23 0543)  methocarbamol (ROBAXIN) tablet 1,000 mg (1,000 mg Oral Given 03/29/23 2956)    ED Course/ Medical Decision Making/ A&P                             Medical Decision Making Risk Prescription drug management.   Patient with known canal stenosis here with recurrent pain from occiput that travels up over head. Pending botox shots with her neurologist but got worse here. Improved with steroids/robaxin here, will d/c on same. No trauma, fever, nuchal rigidity or other indication for imaging or workup here. Will continue to follow with her outpatient team.    Final Clinical Impression(s) / ED Diagnoses Final diagnoses:  Nonintractable headache, unspecified chronicity pattern, unspecified headache type    Rx / DC Orders ED Discharge Orders          Ordered    predniSONE (DELTASONE) 20 MG tablet        03/29/23 0659              Quentin Shorey, Barbara Cower, MD 03/29/23 [phone]

## 2023-03-29 NOTE — ED Triage Notes (Addendum)
Presents from home for pain related to previous diagnosed pinched nerves in neck that cause severe HA. Pain x 2 days Occipital pain. Endorses photophobia, N/V, vision blurring Denies fever Has tried aleve, zofran, motrin (last dose 4 hrs ago), lying in a dark room.  She has gotten relief from steroids in the past.
